# Patient Record
Sex: Male | Born: 1940 | Race: White | Hispanic: No | Smoking: Current some day smoker
Health system: Southern US, Community
[De-identification: ages and names within clinical notes are randomized; demographics above are authoritative.]

## PROBLEM LIST (undated history)

## (undated) DIAGNOSIS — I4891 Unspecified atrial fibrillation: Secondary | ICD-10-CM

## (undated) DIAGNOSIS — N529 Male erectile dysfunction, unspecified: Secondary | ICD-10-CM

## (undated) DIAGNOSIS — R7303 Prediabetes: Secondary | ICD-10-CM

## (undated) DIAGNOSIS — G4730 Sleep apnea, unspecified: Secondary | ICD-10-CM

## (undated) DIAGNOSIS — E7800 Pure hypercholesterolemia, unspecified: Secondary | ICD-10-CM

## (undated) DIAGNOSIS — K219 Gastro-esophageal reflux disease without esophagitis: Secondary | ICD-10-CM

## (undated) DIAGNOSIS — R350 Frequency of micturition: Secondary | ICD-10-CM

## (undated) DIAGNOSIS — I639 Cerebral infarction, unspecified: Secondary | ICD-10-CM

## (undated) DIAGNOSIS — J189 Pneumonia, unspecified organism: Secondary | ICD-10-CM

## (undated) DIAGNOSIS — I10 Essential (primary) hypertension: Secondary | ICD-10-CM

## (undated) DIAGNOSIS — R001 Bradycardia, unspecified: Secondary | ICD-10-CM

## (undated) DIAGNOSIS — J449 Chronic obstructive pulmonary disease, unspecified: Secondary | ICD-10-CM

## (undated) DIAGNOSIS — R0600 Dyspnea, unspecified: Secondary | ICD-10-CM

## (undated) DIAGNOSIS — C61 Malignant neoplasm of prostate: Secondary | ICD-10-CM

## (undated) DIAGNOSIS — R413 Other amnesia: Secondary | ICD-10-CM

## (undated) DIAGNOSIS — I499 Cardiac arrhythmia, unspecified: Secondary | ICD-10-CM

## (undated) DIAGNOSIS — R3915 Urgency of urination: Secondary | ICD-10-CM

## (undated) DIAGNOSIS — I219 Acute myocardial infarction, unspecified: Secondary | ICD-10-CM

## (undated) DIAGNOSIS — M1990 Unspecified osteoarthritis, unspecified site: Secondary | ICD-10-CM

## (undated) HISTORY — DX: Unspecified atrial fibrillation: I48.91

## (undated) HISTORY — DX: Malignant neoplasm of prostate: C61

## (undated) HISTORY — DX: Pure hypercholesterolemia, unspecified: E78.00

## (undated) HISTORY — DX: Essential (primary) hypertension: I10

---

## 2002-01-20 ENCOUNTER — Ambulatory Visit (HOSPITAL_COMMUNITY): Admission: RE | Admit: 2002-01-20 | Discharge: 2002-01-20 | Payer: Self-pay | Admitting: Diagnostic Radiology

## 2006-09-10 ENCOUNTER — Ambulatory Visit (HOSPITAL_COMMUNITY): Admission: RE | Admit: 2006-09-10 | Discharge: 2006-09-10 | Payer: Self-pay | Admitting: Family Medicine

## 2006-09-14 ENCOUNTER — Ambulatory Visit (HOSPITAL_COMMUNITY): Admission: RE | Admit: 2006-09-14 | Discharge: 2006-09-14 | Payer: Self-pay | Admitting: Family Medicine

## 2006-10-03 ENCOUNTER — Ambulatory Visit (HOSPITAL_COMMUNITY): Admission: RE | Admit: 2006-10-03 | Discharge: 2006-10-03 | Payer: Self-pay | Admitting: Internal Medicine

## 2006-10-03 ENCOUNTER — Encounter (INDEPENDENT_AMBULATORY_CARE_PROVIDER_SITE_OTHER): Payer: Self-pay | Admitting: Specialist

## 2006-10-03 ENCOUNTER — Ambulatory Visit: Payer: Self-pay | Admitting: Internal Medicine

## 2006-10-03 HISTORY — PX: COLONOSCOPY: SHX174

## 2007-12-24 ENCOUNTER — Ambulatory Visit (HOSPITAL_COMMUNITY): Admission: RE | Admit: 2007-12-24 | Discharge: 2007-12-24 | Payer: Self-pay | Admitting: Family Medicine

## 2011-04-21 NOTE — Op Note (Signed)
Jonathan Greene, Jonathan Greene             ACCOUNT NO.:  [id number]   MEDICAL RECORD NO.:  [id number]          PATIENT TYPE:  AMB   LOCATION:  DAY                           FACILITY:  [facility]   PHYSICIAN:  R. Roetta Sessions, M.D. DATE OF BIRTH:  [birth year]   DATE OF PROCEDURE:  10/03/2006  DATE OF DISCHARGE:                                 OPERATIVE REPORT   INDICATIONS FOR PROCEDURE:  The patient is a 65-year gentleman who was sent  over at the courtesy of Dr. Regino Schultze for colorectal cancer screening.  He is  devoid of any lower GI tract symptoms.  He has never had his lower GI tract  imaged previously and there is no family history of colorectal cancer.  Colonoscopy is now being done.  This approach has discussed with the patient  at that length.  Potential risks, benefits and alternatives have been  reviewed, questions answered and he is agreeable.  Please see documentation  in medical record.   PROCEDURE NOTE:  O2 saturation, blood pressure, pulse and respirations were  monitored throughout entire procedure.   CONSCIOUS SEDATION:  Versed 4 mg IV Demerol 75 mg IV in divided doses.   INSTRUMENT:  Olympus video chip system.   FINDINGS:  Digital rectal exam revealed no abnormalities.   ENDOSCOPIC FINDINGS:  The prep was adequate.   Rectum:  Examination of rectal mucosa including retroflexed view of the anal  verge revealed clustering of four diminutive polyps just inside the anal  verge.  Please see photos.  The largest one was approximately 3 mm in  diameter.  The remainder of  the rectal mucosa appeared normal.   Colon:  Colonic mucosa was surveyed, rectosigmoid junction through the left  transverse and right colon to the area of appendiceal orifice, ileocecal  valve and cecum.  These structures were well seen and photographed for the  record.  From this level, the scope was slowly withdrawn.  All previously  mentioned mucosal surfaces were again seen.  The patient had sigmoid  diverticula.  The remainder of the colonic mucosa appeared normal.  The  diminutive polyps in the rectum were cold biopsied/removed.  The patient  tolerated the procedure well as reactive to endoscopy.   IMPRESSION:  1. Diminutive rectal polyps cold biopsied/removed, otherwise normal      rectum.  2. Sigmoid diverticula.  Remainder of colon mucosa appeared normal.   RECOMMENDATIONS:  1. Diverticulosis literature provided to [name].  2. Follow-up on path.  3. Further recommendations to follow.      [name], M.D.  Electronically Signed     RMR/MEDQ  D:  10/03/2006  T:  10/03/2006  Job:  [id number]   cc:   Kirk Ruths, M.D.  Fax: [phone]

## 2011-12-06 ENCOUNTER — Encounter: Payer: Self-pay | Admitting: Radiation Oncology

## 2011-12-06 NOTE — Progress Notes (Signed)
72 year old male. August 2011 presented to Dr. Isabel Caprice with a mildly elevated PSA of 4.5 and ultrasound showed prostate volume of 30 grams. Biopsy revealed adenocarcinoma of the prostate with a very favorable situation and 1/12 cores showed about 5% involvement with a Gleason 3+3=6. Patient returned to Dr. Isabel Caprice 11/20/11 following ultrasound and biopsy. This biopsy showed Gleason's 7 cancer in 3 out of 6 biopsies from the left side and on the right he still had 1/6 biopsies positive for a Gleason's 6 cancer. Prostate volume remains 30 grams. Clear evidence of pathologic progression was noted. Patient here today to discuss his options seed alone versus external beam plus seeds. Dr. Isabel Caprice has put the patient on an alpha blocker to see how much this improves his voiding.

## 2011-12-07 ENCOUNTER — Ambulatory Visit
Admission: RE | Admit: 2011-12-07 | Discharge: 2011-12-07 | Disposition: A | Discharge status: Home / Self Care | Payer: Medicare Other | Source: Ambulatory Visit | Attending: Radiation Oncology | Admitting: Radiation Oncology

## 2011-12-07 ENCOUNTER — Encounter: Payer: Self-pay | Admitting: Radiation Oncology

## 2011-12-07 VITALS — BP 148/81 | HR 50 | Temp 97.2°F | Resp 18 | Ht 75.0 in | Wt 215.2 lb

## 2011-12-07 DIAGNOSIS — Z510 Encounter for antineoplastic radiation therapy: Secondary | ICD-10-CM | POA: Insufficient documentation

## 2011-12-07 DIAGNOSIS — C61 Malignant neoplasm of prostate: Secondary | ICD-10-CM | POA: Insufficient documentation

## 2011-12-07 DIAGNOSIS — Z79899 Other long term (current) drug therapy: Secondary | ICD-10-CM | POA: Insufficient documentation

## 2011-12-07 HISTORY — DX: Bradycardia, unspecified: R00.1

## 2011-12-07 NOTE — Progress Notes (Signed)
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 male who is seen today for the courtesy of Dr. 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 He presented with an elevated PSA of 4.53 and the summer of 2011. Ultrasound-guided biopsies revealed adenocarcinoma with a Gleason score of 6 (3+3) involving 5% of one core from the right apex. He elected for observation. His PSA rose to 7.18 and repeat biopsies revealed Gleason 7 (4+3) involving 20% of one core from the left lateral mid gland and 30% of one core from the left lateral apex. He a Gleason 7 (3+4) involving 40% of one core from the left lateral base and also Gleason 6 (3+3) involving 5% of one core from right lateral mid gland. His gland volume was approximately 30 cc. He does have a history of urinary urgency and perhaps some obstructive symptomatology, but his IP PS score today is 10. He does have a moderate amount of urinary urgency. He claims to have erections but is not sexually active. He is considering a robotic prostatectomy with Dr. Isabel Caprice.   PREVIOUS RADIATION THERAPY: No   PAST MEDICAL HISTORY:  has a past medical history of Heartburn; Hypercholesterolemia; Hypertension; Prostate cancer; and Bradycardia.     PAST SURGICAL HISTORY:  Past Surgical History  Procedure Date  . Prostate biopsy   . Broken finger     pointer finger left hand with pins     FAMILY HISTORY: family history includes Stroke in his mother.  There is no history of Cancer. he tells me that most of his paternal uncles lived to be well  into their 47s and one lived to be over 100.   SOCIAL HISTORY: Married, 1 daughter and one adopted son.  reports that he has quit smoking. His smoking use included Cigarettes. He has a 70 pack-year smoking history. His smokeless tobacco use includes Chew. He reports that he drinks about 1.2 ounces of alcohol per week. He reports that he does not use illicit drugs. Retired Publishing rights manager   ALLERGIES: Review of patient's allergies indicates no known allergies.   MEDICATIONS:  Current Outpatient Prescriptions  Medication Sig Dispense Refill  . aspirin 81 MG tablet Take 160 mg by mouth daily.        Marland Kitchen b complex vitamins capsule Take 1 capsule by mouth daily.        Marland Kitchen co-enzyme Q-10 30 MG capsule Take 30 mg by mouth 3 (three) times daily.        . fish oil-omega-3 fatty acids 1000 MG capsule Take 2 g by mouth daily.        Marland Kitchen lisinopril (PRINIVIL,ZESTRIL) 20 MG tablet Take 20 mg by mouth daily.        Marland Kitchen omeprazole (PRILOSEC) 20 MG capsule Take 20 mg by mouth daily.        . rosuvastatin (CRESTOR) 10 MG tablet Take 10 mg by mouth daily.            REVIEW OF SYSTEMS:  Pertinent items are noted in HPI.    PHYSICAL EXAM:  height is  6\' 3"  (1.905 m) and weight is 215 lb 3.2 oz (97.614 kg). His oral temperature is 97.2 F (36.2 C). His blood pressure is 148/81 and his pulse is 50. His respiration is 18 and oxygen saturation is 100%.   Head and neck examination grossly unremarkable. Nodes: Without palpable cervical or supraclavicular lymphadenopathy. Chest: Lungs clear. Back: Without spinal or CVA tenderness. Heart: Regular rate rhythm. Abdomen: Without masses or organomegaly. Genitalia: Unremarkable to inspection. Rectal: The gland is slightly indurated throughout, particularly in the left apical region which is slightly raised. Extremities without edema. Neurologic examination grossly nonfocal.   LABORATORY DATA:  No results found for this basename: WBC, HGB, HCT, MCV, PLT   No results found for  this basename: NA, K, CL, CO2   No results found for this basename: ALT, AST, GGT, ALKPHOS, BILITOT   PSA 7.18 from 10/30/2011.   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 I explained to Mr. Casale that his prognosis is related to his stage, Gleason score, and PSA level. His stage and PSA level are favorable, however his Gleason score is of intermediate favorability. We discussed surgery versus continued observation versus radiation therapy. Considering his disease progression over the past year, do recommend surgery or radiation therapy with curative intent. His radiation therapy options include 8 weeks of external beam/IMR T. or 5 weeks of external beam followed by seed implantation. Based on his I PSS score of 10 he is a candidate for seed implantation as a boost. Dr. Isabel Caprice called in a prescription for an alpha blocker to see if this helps him with his urinary symptomatology. He lives just [place] and he would come to [place] for his external beam treatment. I discussed the potential acute and late toxicities of radiation therapy. He will think things over and get back in touch with Dr. Isabel Caprice regarding surgery or me regarding possible radiation therapy.   PLAN: As described above. If he wants to proceed with radiation therapy and I will need to have Dr. Isabel Caprice place 3 gold seed markers into his prostate for image guidance.   I spent 60 minutes minutes face to face with the patient and more than 50% of that time was spent in counseling and/or coordination of care.

## 2011-12-07 NOTE — Progress Notes (Signed)
Completed PATIENT MEASURE OF DISTRESS thermometer with score of 8 turned into social work. Please see prior progress note from this writer addressing distress. Also, completed NUTRITION RISK SCREEN worksheet completed and turned into Zenovia Jarred, RD.

## 2011-12-07 NOTE — Progress Notes (Addendum)
Patient presents to the clinic today unaccompanied for an initial consultation with Dr. Dayton Scrape reference prostate ca. Patient is alert and oriented to person, place, and time. No distress noted. Steady gait noted. Pleasant affect noted. Patient denies pain at this time. Patient reports he is under a lot of stress presently while caring for his wife. Patient reports his wife was recently discharged from [facility] hospital following care for a bad heart valve. Complete DISTRESS THERMOMETER worksheet turned into social work with a score of 8. Patient denies need to see social worker today. Patient confirms his distress will improve as his wife does. Patient reports he is not incontinent of urine but that once he get the urge to go he has to find a bathroom immediately. Patient reports an overall weak urine stream in comparison to "what it was years ago." Patient reports that "hardly ever" does he have to get up during the middle of the night to void. Patient reports only experiencing burning upon urination following biopsy. Patient denies seeing blood in his urine.   IPSS score 10. NKDA Patient denies having radiation in the past. Patient denies having a pacemaker.   Lives independently with wife. 1 adopted child and 1 blood child. 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

## 2011-12-07 NOTE — Progress Notes (Signed)
Encounter addended by: Ardell Isaacs on: 12/07/2011  5:20 PM<BR>     Documentation filed: Charges VN

## 2011-12-07 NOTE — Progress Notes (Signed)
Please see the Nurse Progress Note in the MD Initial Consult Encounter for this patient. 

## 2011-12-11 ENCOUNTER — Encounter: Payer: Self-pay | Admitting: Radiation Oncology

## 2011-12-11 ENCOUNTER — Telehealth: Payer: Self-pay | Admitting: *Deleted

## 2011-12-11 NOTE — Progress Notes (Signed)
Chart note:  [name]: Today and he wants to proceed with 5 weeks of external beam radiation therapy followed by seed implantation. He does have a history of obstructive symptomatology, but his IPS S. score is satisfactory. We will, we asked Dr. Isabel Caprice to place 3 gold markers within the prostate and then have him return for simulation/treatment planning. Consent will be signed when he return for treatment planning.

## 2011-12-29 ENCOUNTER — Telehealth: Payer: Self-pay | Admitting: *Deleted

## 2012-01-01 ENCOUNTER — Ambulatory Visit
Admission: RE | Admit: 2012-01-01 | Discharge: 2012-01-01 | Disposition: A | Discharge status: Home / Self Care | Payer: Medicare Other | Source: Ambulatory Visit | Attending: Radiation Oncology | Admitting: Radiation Oncology

## 2012-01-01 DIAGNOSIS — C61 Malignant neoplasm of prostate: Secondary | ICD-10-CM

## 2012-01-01 NOTE — Progress Notes (Signed)
Simulation/treatment planning note:  [name] underwent simulation/treatment planning today in the management of his carcinoma prostate. An alpha cradle was constructed for immobilization. A red rubber catheter was placed within the rectal vault. He was then catheterized and contrast instilled into the bladder/urethra. He was then scanned. I chose and isocenter in the center of the prostate. His CT data was sent for treatment planning and a CT arch study. His prostate was contoured in Focal[employer] and he was then had a prostate volume of 29.7 cc and prosthetic length of 4.2 cm. His archeis narrow, however he is a candidate for a prostate seed implant boost. I also contoured his seminal vesicles and prostate in Eclipse. His bladder, rectum and femoral heads were also contoured. I'm prescribing 4500 cGy to his prostate PTV which includes the prostate +1.0 cm except for 0.5 cm along the rectum. This will also include the seminal vesicles by 0.5 cm. He is now ready for 3-D simulation. This was followed by prostate seed implant to deliver a further 11,000 cGy.I-125. He'll be implanted with the Nucletron system.

## 2012-01-01 NOTE — Progress Notes (Signed)
Met with patient to discuss RO billing.  Patient had no concerns today. 

## 2012-01-02 ENCOUNTER — Encounter: Payer: Self-pay | Admitting: Radiation Oncology

## 2012-01-02 NOTE — Progress Notes (Signed)
3-D simulation note:  The patient underwent 3-D simulation in the management of his carcinoma the prostate. He was set up with 4 field technique. 4 separate multileaf collimators were designed to conform the field. Dose volume histograms were obtained for the prostate, rectum, and bladder. I chose the 97% isodose curve to cover the planning target volume which represents a prostate +1.0 cm except for 0.5 cm along the rectum. This also included the seminal vesicles posterior 0.5 cm.

## 2012-01-08 ENCOUNTER — Ambulatory Visit
Admission: RE | Admit: 2012-01-08 | Discharge: 2012-01-08 | Disposition: A | Discharge status: Home / Self Care | Payer: Medicare Other | Source: Ambulatory Visit | Attending: Radiation Oncology | Admitting: Radiation Oncology

## 2012-01-08 ENCOUNTER — Encounter: Payer: Self-pay | Admitting: Radiation Oncology

## 2012-01-08 NOTE — Progress Notes (Signed)
Simulation verification note: The patient underwent simulation verification today in the management of his carcinoma the prostate. His isocenter is in good position and the multileaf collimators contoured the treatment volume appropriately.

## 2012-01-09 ENCOUNTER — Ambulatory Visit
Admission: RE | Admit: 2012-01-09 | Discharge: 2012-01-09 | Disposition: A | Discharge status: Home / Self Care | Payer: Medicare Other | Source: Ambulatory Visit | Attending: Radiation Oncology | Admitting: Radiation Oncology

## 2012-01-10 ENCOUNTER — Ambulatory Visit
Admission: RE | Admit: 2012-01-10 | Discharge: 2012-01-10 | Disposition: A | Discharge status: Home / Self Care | Payer: Medicare Other | Source: Ambulatory Visit | Attending: Radiation Oncology | Admitting: Radiation Oncology

## 2012-01-10 DIAGNOSIS — C61 Malignant neoplasm of prostate: Secondary | ICD-10-CM

## 2012-01-10 NOTE — Progress Notes (Signed)
Patient presents to the clinic today unaccompanied for post sim education with Lelon Mast, Charity fundraiser. Patient is alert and oriented to person, place, and time. No distress noted. Steady gait noted. Pleasant affect noted. Patient denies pain at this time. Oriented patient to staff and routine of the clinic. Provided patient with RADIATION THERAPY AND YOU handbook and reviewed the pertinent information. Provided patient with this writers business card and encouraged him to call with needs. Patient verbalized understanding of all things reviewed.

## 2012-01-11 ENCOUNTER — Ambulatory Visit
Admission: RE | Admit: 2012-01-11 | Discharge: 2012-01-11 | Disposition: A | Discharge status: Home / Self Care | Payer: Medicare Other | Source: Ambulatory Visit | Attending: Radiation Oncology | Admitting: Radiation Oncology

## 2012-01-12 ENCOUNTER — Ambulatory Visit
Admission: RE | Admit: 2012-01-12 | Discharge: 2012-01-12 | Disposition: A | Discharge status: Home / Self Care | Payer: Medicare Other | Source: Ambulatory Visit | Attending: Radiation Oncology | Admitting: Radiation Oncology

## 2012-01-15 ENCOUNTER — Other Ambulatory Visit: Payer: Self-pay | Admitting: Urology

## 2012-01-15 ENCOUNTER — Ambulatory Visit
Admission: RE | Admit: 2012-01-15 | Discharge: 2012-01-15 | Disposition: A | Discharge status: Home / Self Care | Payer: Medicare Other | Source: Ambulatory Visit | Attending: Radiation Oncology | Admitting: Radiation Oncology

## 2012-01-15 ENCOUNTER — Encounter: Payer: Self-pay | Admitting: Radiation Oncology

## 2012-01-15 VITALS — BP 140/87 | HR 49 | Resp 18 | Wt 220.2 lb

## 2012-01-15 DIAGNOSIS — C61 Malignant neoplasm of prostate: Secondary | ICD-10-CM

## 2012-01-15 NOTE — Progress Notes (Signed)
Patient presents to the clinic today unaccompanied for under treat visit with Dr. Dayton Scrape. Patient is alert and oriented to person, place, and time. No distress noted. Steady gait noted. Pleasant affect noted. Patient denies pain at this time. Patient denies pain or burning with bowel movement or urination. Patient reports that he got up once to void all last week during the night. Patient reports that his urine stream is stronger while taking Flomax. Patient reports concerns about taking Flomax because it causes his bp to drop. Reported all findings to Dr. Dayton Scrape.

## 2012-01-15 NOTE — Progress Notes (Signed)
Weekly Management Note:  Site:Prostate Current Dose:  900  cGy Projected Dose: 4500  cGy  Narrative: The patient is seen today for routine under treatment assessment. CBCT/MVCT images/port films were reviewed. The chart was reviewed.   Satisfactory bladder filling today. No GU or GI difficulties. He does have some orthostatic hypotension when getting out of bed in the morning, and this began after starting Flomax. His Flomax has been helpful with respect to improving his urinary stream.  Physical Examination:  Filed Vitals:   01/15/12 0957  BP: 140/87  Pulse: 49  Resp: 18  .  Weight: 220 lb 3.2 oz (99.882 kg). No change. There is no orthostatic hypotension today.  Impression: Tolerating radiation therapy well. 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 I told him to sit on his bed for at least one to 2 minutes before standing and walking around. If his lightheadedness continues we may need to change the timing of his Flomax.  Plan: Continue radiation therapy as planned. Implant boost date is pending.

## 2012-01-16 ENCOUNTER — Ambulatory Visit
Admission: RE | Admit: 2012-01-16 | Discharge: 2012-01-16 | Disposition: A | Discharge status: Home / Self Care | Payer: Medicare Other | Source: Ambulatory Visit | Attending: Radiation Oncology | Admitting: Radiation Oncology

## 2012-01-17 ENCOUNTER — Ambulatory Visit
Admission: RE | Admit: 2012-01-17 | Discharge: 2012-01-17 | Disposition: A | Discharge status: Home / Self Care | Payer: Medicare Other | Source: Ambulatory Visit | Attending: Radiation Oncology | Admitting: Radiation Oncology

## 2012-01-18 ENCOUNTER — Ambulatory Visit
Admission: RE | Admit: 2012-01-18 | Discharge: 2012-01-18 | Disposition: A | Discharge status: Home / Self Care | Payer: Medicare Other | Source: Ambulatory Visit | Attending: Radiation Oncology | Admitting: Radiation Oncology

## 2012-01-18 ENCOUNTER — Encounter: Payer: Self-pay | Admitting: Radiation Oncology

## 2012-01-18 NOTE — Progress Notes (Signed)
Weekly Management Note:  Site:Prostate Current Dose:  1440  cGy Projected Dose: 4500  cGy  Narrative: The patient is seen today for routine under treatment assessment. CBCT/MVCT images/port films were reviewed. The chart was reviewed.   No complaints today.  Physical Examination: There were no vitals filed for this visit..  Weight:  . No change.  Impression: Tolerating radiation therapy well.  Plan: Continue radiation therapy as planned.

## 2012-01-19 ENCOUNTER — Ambulatory Visit
Admission: RE | Admit: 2012-01-19 | Discharge: 2012-01-19 | Disposition: A | Discharge status: Home / Self Care | Payer: Medicare Other | Source: Ambulatory Visit | Attending: Radiation Oncology | Admitting: Radiation Oncology

## 2012-01-22 ENCOUNTER — Ambulatory Visit
Admission: RE | Admit: 2012-01-22 | Discharge: 2012-01-22 | Disposition: A | Discharge status: Home / Self Care | Payer: Medicare Other | Source: Ambulatory Visit | Attending: Radiation Oncology | Admitting: Radiation Oncology

## 2012-01-23 ENCOUNTER — Ambulatory Visit
Admission: RE | Admit: 2012-01-23 | Discharge: 2012-01-23 | Disposition: A | Discharge status: Home / Self Care | Payer: Medicare Other | Source: Ambulatory Visit | Attending: Radiation Oncology | Admitting: Radiation Oncology

## 2012-01-24 ENCOUNTER — Ambulatory Visit
Admission: RE | Admit: 2012-01-24 | Discharge: 2012-01-24 | Disposition: A | Discharge status: Home / Self Care | Payer: Medicare Other | Source: Ambulatory Visit | Attending: Radiation Oncology | Admitting: Radiation Oncology

## 2012-01-25 ENCOUNTER — Ambulatory Visit
Admission: RE | Admit: 2012-01-25 | Discharge: 2012-01-25 | Disposition: A | Discharge status: Home / Self Care | Payer: Medicare Other | Source: Ambulatory Visit | Attending: Radiation Oncology | Admitting: Radiation Oncology

## 2012-01-26 ENCOUNTER — Ambulatory Visit
Admission: RE | Admit: 2012-01-26 | Discharge: 2012-01-26 | Disposition: A | Discharge status: Home / Self Care | Payer: Medicare Other | Source: Ambulatory Visit | Attending: Radiation Oncology | Admitting: Radiation Oncology

## 2012-01-26 NOTE — Telephone Encounter (Signed)
xxx

## 2012-01-29 ENCOUNTER — Other Ambulatory Visit: Payer: Self-pay | Admitting: Urology

## 2012-01-29 ENCOUNTER — Ambulatory Visit
Admission: RE | Admit: 2012-01-29 | Discharge: 2012-01-29 | Disposition: A | Discharge status: Home / Self Care | Payer: Medicare Other | Source: Ambulatory Visit | Attending: Radiation Oncology | Admitting: Radiation Oncology

## 2012-01-29 ENCOUNTER — Encounter: Payer: Self-pay | Admitting: Radiation Oncology

## 2012-01-29 VITALS — Wt 221.4 lb

## 2012-01-29 DIAGNOSIS — C61 Malignant neoplasm of prostate: Secondary | ICD-10-CM

## 2012-01-29 NOTE — Progress Notes (Signed)
Weekly Management Note:  Site:Prostate Current Dose:  2700  cGy Projected Dose: 4500  cGy  Narrative: The patient is seen today for routine under treatment assessment. CBCT/MVCT images/port films were reviewed. The chart was reviewed.   Good bladder filling. No GU or GI problems. He is less lightheaded when he takes his Flomax after lunch each day.  Physical Examination: There were no vitals filed for this visit..  Weight: 221 lb 6.4 oz (100.426 kg). No change.  Impression: Tolerating radiation therapy well. He'll have his seed implant boost with Dr. Brunilda Payor on March 27.  Plan: Continue radiation therapy as planned.

## 2012-01-29 NOTE — Progress Notes (Signed)
Slight fatigue, no bladder or bowel issues.

## 2012-01-30 ENCOUNTER — Other Ambulatory Visit: Payer: Self-pay

## 2012-01-30 ENCOUNTER — Ambulatory Visit
Admission: RE | Admit: 2012-01-30 | Discharge: 2012-01-30 | Disposition: A | Discharge status: Home / Self Care | Payer: Medicare Other | Source: Ambulatory Visit | Attending: Urology | Admitting: Urology

## 2012-01-30 ENCOUNTER — Ambulatory Visit (HOSPITAL_BASED_OUTPATIENT_CLINIC_OR_DEPARTMENT_OTHER)
Admission: RE | Admit: 2012-01-30 | Discharge: 2012-01-30 | Disposition: A | Discharge status: Home / Self Care | Payer: Medicare Other | Source: Ambulatory Visit | Attending: Urology | Admitting: Urology

## 2012-01-30 ENCOUNTER — Ambulatory Visit
Admission: RE | Admit: 2012-01-30 | Discharge: 2012-01-30 | Disposition: A | Discharge status: Home / Self Care | Payer: Medicare Other | Source: Ambulatory Visit | Attending: Radiation Oncology | Admitting: Radiation Oncology

## 2012-01-30 DIAGNOSIS — C61 Malignant neoplasm of prostate: Secondary | ICD-10-CM | POA: Insufficient documentation

## 2012-01-30 DIAGNOSIS — I10 Essential (primary) hypertension: Secondary | ICD-10-CM | POA: Insufficient documentation

## 2012-01-30 DIAGNOSIS — E7800 Pure hypercholesterolemia, unspecified: Secondary | ICD-10-CM | POA: Insufficient documentation

## 2012-01-30 DIAGNOSIS — R9431 Abnormal electrocardiogram [ECG] [EKG]: Secondary | ICD-10-CM | POA: Insufficient documentation

## 2012-01-30 DIAGNOSIS — Z01810 Encounter for preprocedural cardiovascular examination: Secondary | ICD-10-CM | POA: Insufficient documentation

## 2012-01-31 ENCOUNTER — Ambulatory Visit
Admission: RE | Admit: 2012-01-31 | Discharge: 2012-01-31 | Disposition: A | Discharge status: Home / Self Care | Payer: Medicare Other | Source: Ambulatory Visit | Attending: Radiation Oncology | Admitting: Radiation Oncology

## 2012-02-01 ENCOUNTER — Ambulatory Visit
Admission: RE | Admit: 2012-02-01 | Discharge: 2012-02-01 | Disposition: A | Discharge status: Home / Self Care | Payer: Medicare Other | Source: Ambulatory Visit | Attending: Radiation Oncology | Admitting: Radiation Oncology

## 2012-02-02 ENCOUNTER — Ambulatory Visit
Admission: RE | Admit: 2012-02-02 | Discharge: 2012-02-02 | Disposition: A | Discharge status: Home / Self Care | Payer: Medicare Other | Source: Ambulatory Visit | Attending: Radiation Oncology | Admitting: Radiation Oncology

## 2012-02-05 ENCOUNTER — Ambulatory Visit
Admission: RE | Admit: 2012-02-05 | Discharge: 2012-02-05 | Disposition: A | Discharge status: Home / Self Care | Payer: Medicare Other | Source: Ambulatory Visit | Attending: Radiation Oncology | Admitting: Radiation Oncology

## 2012-02-05 ENCOUNTER — Encounter: Payer: Self-pay | Admitting: Radiation Oncology

## 2012-02-05 VITALS — BP 145/76 | HR 49 | Resp 18 | Wt 224.2 lb

## 2012-02-05 DIAGNOSIS — C61 Malignant neoplasm of prostate: Secondary | ICD-10-CM

## 2012-02-05 NOTE — Progress Notes (Signed)
Patient presents to the clinic today unaccompanied for under treat visit with Dr. Dayton Scrape. Patient is alert and oriented to person, place, and time. No distress noted. Steady gait noted. Pleasant affect noted. Patient denies pain at this time. Patient reports occasional burning with urination. Patient denies hematuria and nocturia. Patient reports that he "has been taking it really easy" therefore he is unsure if his energy level has declined. Patient is concerned about his wife's upcoming open heart surgery. Reported all findings to Dr. Dayton Scrape.

## 2012-02-05 NOTE — Progress Notes (Signed)
Weekly Management Note:  Site:Prostate Current Dose:  3600  cGy Projected Dose: 4500  cGy  Narrative: The patient is seen today for routine under treatment assessment. CBCT/MVCT images/port films were reviewed. The chart was reviewed.   The bladder filling today. He does report slight dysuria which is not particularly bothersome. No GI difficulties. I told that he may take ibuprofen up until 10 days before his implant if needed for painful urination.  Physical Examination:  Filed Vitals:   02/05/12 0949  BP: 145/76  Pulse: 49  Resp: 18  .  Weight: 224 lb 3.2 oz (101.696 kg). No change.  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  Plan: Continue radiation therapy as planned. She is scheduled for his seed implant boost on March 27.

## 2012-02-06 ENCOUNTER — Ambulatory Visit
Admission: RE | Admit: 2012-02-06 | Discharge: 2012-02-06 | Disposition: A | Discharge status: Home / Self Care | Payer: Medicare Other | Source: Ambulatory Visit | Attending: Radiation Oncology | Admitting: Radiation Oncology

## 2012-02-07 ENCOUNTER — Ambulatory Visit
Admission: RE | Admit: 2012-02-07 | Discharge: 2012-02-07 | Disposition: A | Discharge status: Home / Self Care | Payer: Medicare Other | Source: Ambulatory Visit | Attending: Radiation Oncology | Admitting: Radiation Oncology

## 2012-02-08 ENCOUNTER — Ambulatory Visit
Admission: RE | Admit: 2012-02-08 | Discharge: 2012-02-08 | Disposition: A | Discharge status: Home / Self Care | Payer: Medicare Other | Source: Ambulatory Visit | Attending: Radiation Oncology | Admitting: Radiation Oncology

## 2012-02-09 ENCOUNTER — Ambulatory Visit
Admission: RE | Admit: 2012-02-09 | Discharge: 2012-02-09 | Disposition: A | Discharge status: Home / Self Care | Payer: Medicare Other | Source: Ambulatory Visit | Attending: Radiation Oncology | Admitting: Radiation Oncology

## 2012-02-12 ENCOUNTER — Ambulatory Visit
Admission: RE | Admit: 2012-02-12 | Discharge: 2012-02-12 | Disposition: A | Discharge status: Home / Self Care | Payer: Medicare Other | Source: Ambulatory Visit | Attending: Radiation Oncology | Admitting: Radiation Oncology

## 2012-02-12 ENCOUNTER — Encounter: Payer: Self-pay | Admitting: Radiation Oncology

## 2012-02-12 VITALS — BP 152/87 | HR 47 | Resp 18 | Wt 224.7 lb

## 2012-02-12 DIAGNOSIS — C61 Malignant neoplasm of prostate: Secondary | ICD-10-CM

## 2012-02-12 NOTE — Progress Notes (Signed)
Patient presents to the clinic today unaccompanied for under treat visit with Dr. Dayton Scrape. Patient is alert and oriented to person, place, and time. No distress noted. Steady gait noted. Pleasant affect noted. Patient denies pain at this time. Patient reports that burning with urination has improved. Patient denies nocturia, frequency or urgency. Patient reports his energy level good. Reported all findings to Dr. Dayton Scrape.

## 2012-02-12 NOTE — Progress Notes (Signed)
Weekly Management Note:  Site:Prostate Current Dose:  4500  cGy Projected Dose: 4500  cGy  Narrative: The patient is seen today for routine under treatment assessment. CBCT/MVCT images/port films were reviewed. The chart was reviewed.   His bladder filling is excellent. No GU or GI difficulties. His dysuria is actually almost nonexistent. External beam portion of his treatment is completed today.  Physical Examination:  Filed Vitals:   02/12/12 0928  BP: 152/87  Pulse: 47  Resp: 18  .  Weight: 224 lb 11.2 oz (101.923 kg). No change.  Impression: Satisfactory progress. His external beam treatment is completed today.  Plan: A seed implant boost on March 27 with Dr. Brunilda Payor.

## 2012-02-15 ENCOUNTER — Encounter: Payer: Self-pay | Admitting: Radiation Oncology

## 2012-02-15 NOTE — Progress Notes (Signed)
[facility] Radiation Oncology  Name:Jonathan Greene  Date:02/15/2012           ZOX:[id number] DOB:[birth year]   Status:outpatient    CC: Valetta Fuller, MD, MD    REFERRING PHYSICIAN: Dr. Barron Alvine    DIAGNOSIS: Stage T2a intermediate risk adenocarcinoma prostate   INDICATION FOR TREATMENT: Curative   TREATMENT DATES: 01/09/2012 through 02/12/2012                          SITE/DOSE:   Prostate/seminal vesicles 4500 cGy 25 sessions                         BEAMS/ENERGY: 15 MEV electrons, 4 field technique with 3-D conformal planning                  NARRATIVE:    The patient tolerated treatment well with no significant GU or GI toxicities.                        PLAN: He will return for his prostate seed implant boost with Dr. Isabel Caprice on March 27.   Patient instructed to call if questions or worsening complaints in interim.

## 2012-02-21 ENCOUNTER — Encounter (HOSPITAL_BASED_OUTPATIENT_CLINIC_OR_DEPARTMENT_OTHER): Payer: Self-pay | Admitting: *Deleted

## 2012-02-21 LAB — COMPREHENSIVE METABOLIC PANEL
Alkaline Phosphatase: 91 U/L (ref 39–117)
BUN: 10 mg/dL (ref 6–23)
GFR calc Af Amer: >90 mL/min (ref >90)
Glucose, Bld: 115 mg/dL — ABNORMAL HIGH (ref 70–99)
Potassium: 3.6 mEq/L (ref 3.5–5.1)
Total Protein: 6.7 g/dL (ref 6.0–8.3)

## 2012-02-21 LAB — CBC
HCT: 43.7 % (ref 39.0–52.0)
Hemoglobin: 14.6 g/dL (ref 13.0–17.0)
MCH: 30.6 pg (ref 26.0–34.0)
MCHC: 33.4 g/dL (ref 30.0–36.0)
MCV: 91.6 fL (ref 78.0–100.0)

## 2012-02-21 LAB — PROTIME-INR: Prothrombin Time: 13.5 seconds (ref 11.6–15.2)

## 2012-02-21 NOTE — Progress Notes (Signed)
NPO AFTER MN. ARRIVES AT 0715. CURRENT EKG AND CXR IN EPIC AND CHART. LABS TO BE DONE AT [facility]. WILL DO FLEET ENEMA AM OF SURG. W/ SIP OF WATER.

## 2012-02-27 ENCOUNTER — Telehealth: Payer: Self-pay | Admitting: *Deleted

## 2012-02-27 NOTE — Telephone Encounter (Signed)
XXXX 

## 2012-02-27 NOTE — H&P (Signed)
  Jonathan Greene is an 71 y.o. male.   Chief Complaint: Prostate cancer for seed implantation today.  HPI:  [name] returns today for a prostate cancer discussion. Status post ultrasound and biopsy of his prostate. Biopsy  showed Gleason's 7 cancer in 3 out of 6 biopsies from the left side ( primary Gleason 4 in 2/3 biopsies). On the right he still had 1/6 biopsies positive for a Gleason's 6 cancer. Volume was again around 30 grams. No clinical change. There was clear evidence of pathologic progression and we felt we needed to talk about his situation. AUA score today 18/ 2. SHIM 2/25.  The patient has completed 5 weeks of external beam radiation without Difficulty/complications.  Past Medical History  Diagnosis Date  . Hypercholesterolemia   . Hypertension   . Bradycardia   . Frequent urination   . Urinary urgency   . Prostate cancer     RADIATION TX AND SCHEDULED FOR SEED IMPLANTS 02-28-12  . GERD (gastroesophageal reflux disease)     History reviewed. No pertinent past surgical history.  Family History  Problem Relation Age of Onset  . Stroke Mother   . Cancer Neg Hx    Social History:  reports that he quit smoking about 21 years ago. His smoking use included Cigarettes. He has a 70 pack-year smoking history. His smokeless tobacco use includes Chew. He reports that he drinks about 8.4 ounces of alcohol per week. He reports that he does not use illicit drugs.  Allergies: No Known Allergies  No current facility-administered medications on file as of .   Medications Prior to Admission  Medication Sig Dispense Refill  . aspirin 81 MG tablet Take 81 mg by mouth daily.       Marland Kitchen b complex vitamins capsule Take 1 capsule by mouth daily.       Marland Kitchen co-enzyme Q-10 30 MG capsule Take 30 mg by mouth 3 (three) times daily.       . fish oil-omega-3 fatty acids 1000 MG capsule Take 1 g by mouth daily.       Marland Kitchen lisinopril (PRINIVIL,ZESTRIL) 20 MG tablet Take 20 mg by mouth daily.       Marland Kitchen  omeprazole (PRILOSEC) 20 MG capsule Take 20 mg by mouth every evening.       . rosuvastatin (CRESTOR) 10 MG tablet Take 10 mg by mouth every evening.         No results found for this or any previous visit (from the past 48 hour(s)). No results found.  Review of Systems - Negative except Indigestion, blurred vision, urinary urgency  Height 6\' 3"  (1.905 m), weight 97.523 kg (215 lb). General appearance: alert, cooperative and no distress Neck: no adenopathy and no JVD Resp: clear to auscultation bilaterally Cardio: regular rate and rhythm GI: soft, non-tender; bowel sounds normal; no masses,  no organomegaly Male genitalia: normal, penis: no lesions or discharge. testes: no masses or tenderness. no hernias. Prostate 1+ Extremities: extremities normal, atraumatic, no cyanosis or edema Skin: Skin color, texture, turgor normal. No rashes or lesions Neurologic: Grossly normal  Assessment/Plan Prostate cancer. Seed implantation boost as an outpatient today.  Thong Feeny S 02/27/2012, 12:54 PM

## 2012-02-28 ENCOUNTER — Encounter (HOSPITAL_BASED_OUTPATIENT_CLINIC_OR_DEPARTMENT_OTHER)
Admission: RE | Disposition: A | Discharge status: Home / Self Care | Payer: Self-pay | Source: Ambulatory Visit | Attending: Urology

## 2012-02-28 ENCOUNTER — Ambulatory Visit (HOSPITAL_BASED_OUTPATIENT_CLINIC_OR_DEPARTMENT_OTHER)
Admission: RE | Admit: 2012-02-28 | Discharge: 2012-02-28 | Disposition: A | Discharge status: Home / Self Care | Payer: Medicare Other | Source: Ambulatory Visit | Attending: Urology | Admitting: Urology

## 2012-02-28 ENCOUNTER — Ambulatory Visit (HOSPITAL_COMMUNITY): Payer: Medicare Other

## 2012-02-28 ENCOUNTER — Encounter (HOSPITAL_BASED_OUTPATIENT_CLINIC_OR_DEPARTMENT_OTHER): Payer: Self-pay | Admitting: *Deleted

## 2012-02-28 ENCOUNTER — Ambulatory Visit (HOSPITAL_BASED_OUTPATIENT_CLINIC_OR_DEPARTMENT_OTHER): Payer: Medicare Other | Admitting: Anesthesiology

## 2012-02-28 ENCOUNTER — Encounter (HOSPITAL_BASED_OUTPATIENT_CLINIC_OR_DEPARTMENT_OTHER): Payer: Self-pay | Admitting: Anesthesiology

## 2012-02-28 DIAGNOSIS — C61 Malignant neoplasm of prostate: Secondary | ICD-10-CM | POA: Insufficient documentation

## 2012-02-28 DIAGNOSIS — Z79899 Other long term (current) drug therapy: Secondary | ICD-10-CM | POA: Insufficient documentation

## 2012-02-28 DIAGNOSIS — K219 Gastro-esophageal reflux disease without esophagitis: Secondary | ICD-10-CM | POA: Insufficient documentation

## 2012-02-28 DIAGNOSIS — Z7982 Long term (current) use of aspirin: Secondary | ICD-10-CM | POA: Insufficient documentation

## 2012-02-28 DIAGNOSIS — F17200 Nicotine dependence, unspecified, uncomplicated: Secondary | ICD-10-CM | POA: Insufficient documentation

## 2012-02-28 DIAGNOSIS — E7800 Pure hypercholesterolemia, unspecified: Secondary | ICD-10-CM | POA: Insufficient documentation

## 2012-02-28 DIAGNOSIS — I10 Essential (primary) hypertension: Secondary | ICD-10-CM | POA: Insufficient documentation

## 2012-02-28 HISTORY — PX: RADIOACTIVE SEED IMPLANT: SHX5150

## 2012-02-28 HISTORY — DX: Frequency of micturition: R35.0

## 2012-02-28 HISTORY — DX: Urgency of urination: R39.15

## 2012-02-28 HISTORY — DX: Gastro-esophageal reflux disease without esophagitis: K21.9

## 2012-02-28 HISTORY — PX: CYSTOSCOPY: SHX5120

## 2012-02-28 SURGERY — INSERTION, RADIATION SOURCE, PROSTATE
Anesthesia: General | Site: Rectum | Wound class: Clean Contaminated

## 2012-02-28 MED ORDER — EPHEDRINE SULFATE 50 MG/ML IJ SOLN
INTRAMUSCULAR | Status: DC | PRN
  Administered 2012-02-28 (×4): 5 mg via INTRAVENOUS
  Administered 2012-02-28: 10 mg via INTRAVENOUS
  Administered 2012-02-28: 5 mg via INTRAVENOUS

## 2012-02-28 MED ORDER — CIPROFLOXACIN IN D5W 400 MG/200ML IV SOLN
400.0000 mg | INTRAVENOUS | Status: AC
  Administered 2012-02-28: 400 mg via INTRAVENOUS

## 2012-02-28 MED ORDER — STERILE WATER FOR IRRIGATION IR SOLN
Status: DC | PRN
  Administered 2012-02-28: 3000 mL

## 2012-02-28 MED ORDER — ROCURONIUM BROMIDE 100 MG/10ML IV SOLN
INTRAVENOUS | Status: DC | PRN
  Administered 2012-02-28: 10 mg via INTRAVENOUS
  Administered 2012-02-28: 20 mg via INTRAVENOUS

## 2012-02-28 MED ORDER — IOHEXOL 350 MG/ML SOLN
INTRAVENOUS | Status: DC | PRN
  Administered 2012-02-28: 7 mL

## 2012-02-28 MED ORDER — PROMETHAZINE HCL 25 MG/ML IJ SOLN: 6.2500 mg | INTRAMUSCULAR | Status: DC | PRN

## 2012-02-28 MED ORDER — FENTANYL CITRATE 0.05 MG/ML IJ SOLN
25.0000 ug | INTRAMUSCULAR | Status: DC | PRN
  Administered 2012-02-28: 25 ug via INTRAVENOUS

## 2012-02-28 MED ORDER — DEXAMETHASONE SODIUM PHOSPHATE 4 MG/ML IJ SOLN
INTRAMUSCULAR | Status: DC | PRN
  Administered 2012-02-28: 10 mg via INTRAVENOUS

## 2012-02-28 MED ORDER — MIDAZOLAM HCL 5 MG/5ML IJ SOLN
INTRAMUSCULAR | Status: DC | PRN
  Administered 2012-02-28: 1 mg via INTRAVENOUS

## 2012-02-28 MED ORDER — SUCCINYLCHOLINE CHLORIDE 20 MG/ML IJ SOLN
INTRAMUSCULAR | Status: DC | PRN
  Administered 2012-02-28: 100 mg via INTRAVENOUS

## 2012-02-28 MED ORDER — PROPOFOL 10 MG/ML IV EMUL
INTRAVENOUS | Status: DC | PRN
  Administered 2012-02-28: 120 mg via INTRAVENOUS

## 2012-02-28 MED ORDER — NEOSTIGMINE METHYLSULFATE 1 MG/ML IJ SOLN
INTRAMUSCULAR | Status: DC | PRN
  Administered 2012-02-28: 4 mg via INTRAVENOUS

## 2012-02-28 MED ORDER — LIDOCAINE HCL 2 % EX GEL
CUTANEOUS | Status: DC | PRN
  Administered 2012-02-28: 1

## 2012-02-28 MED ORDER — HYDROCODONE-ACETAMINOPHEN 5-325 MG PO TABS
1.0000 | ORAL_TABLET | ORAL | Status: DC | PRN
  Administered 2012-02-28: 1 via ORAL

## 2012-02-28 MED ORDER — LIDOCAINE HCL (CARDIAC) 20 MG/ML IV SOLN
INTRAVENOUS | Status: DC | PRN
  Administered 2012-02-28 (×2): 100 mg via INTRAVENOUS

## 2012-02-28 MED ORDER — FLEET ENEMA 7-19 GM/118ML RE ENEM: 1.0000 | ENEMA | Freq: Once | RECTAL | Status: DC

## 2012-02-28 MED ORDER — GLYCOPYRROLATE 0.2 MG/ML IJ SOLN
INTRAMUSCULAR | Status: DC | PRN
  Administered 2012-02-28: 0.6 mg via INTRAVENOUS
  Administered 2012-02-28: 0.2 mg via INTRAVENOUS

## 2012-02-28 MED ORDER — ONDANSETRON HCL 4 MG/2ML IJ SOLN
INTRAMUSCULAR | Status: DC | PRN
  Administered 2012-02-28: 4 mg via INTRAVENOUS

## 2012-02-28 MED ORDER — LACTATED RINGERS IV SOLN
INTRAVENOUS | Status: DC
  Administered 2012-02-28 (×2): via INTRAVENOUS
  Administered 2012-02-28: 100 mL/h via INTRAVENOUS

## 2012-02-28 MED ORDER — FENTANYL CITRATE 0.05 MG/ML IJ SOLN
INTRAMUSCULAR | Status: DC | PRN
  Administered 2012-02-28 (×3): 25 ug via INTRAVENOUS
  Administered 2012-02-28: 50 ug via INTRAVENOUS
  Administered 2012-02-28: 25 ug via INTRAVENOUS
  Administered 2012-02-28 (×2): 12.5 ug via INTRAVENOUS
  Administered 2012-02-28: 25 ug via INTRAVENOUS

## 2012-02-28 SURGICAL SUPPLY — 25 items
BAG URINE DRAINAGE (UROLOGICAL SUPPLIES) ×3 IMPLANT
BLADE SURG ROTATE 9660 (MISCELLANEOUS) ×3 IMPLANT
CATH FOLEY 2WAY SLVR  5CC 16FR (CATHETERS) ×2
CATH FOLEY 2WAY SLVR 5CC 16FR (CATHETERS) ×4 IMPLANT
CATH ROBINSON RED A/P 20FR (CATHETERS) ×3 IMPLANT
CLOTH BEACON ORANGE TIMEOUT ST (SAFETY) ×3 IMPLANT
COVER MAYO STAND STRL (DRAPES) ×3 IMPLANT
COVER TABLE BACK 60X90 (DRAPES) ×3 IMPLANT
DRSG TEGADERM 4X4.75 (GAUZE/BANDAGES/DRESSINGS) ×4 IMPLANT
DRSG TEGADERM 8X12 (GAUZE/BANDAGES/DRESSINGS) ×3 IMPLANT
GLOVE BIO SURGEON STRL SZ7.5 (GLOVE) ×6 IMPLANT
GLOVE BIOGEL M STRL SZ7.5 (GLOVE) ×4 IMPLANT
GLOVE ECLIPSE 8.0 STRL XLNG CF (GLOVE) IMPLANT
GLOVE INDICATOR 6.5 STRL GRN (GLOVE) ×2 IMPLANT
GOWN STRL REIN XL XLG (GOWN DISPOSABLE) ×3 IMPLANT
GOWN SURGICAL LARGE (GOWNS) ×1 IMPLANT
HOLDER FOLEY CATH W/STRAP (MISCELLANEOUS) ×3 IMPLANT
PACK CYSTOSCOPY (CUSTOM PROCEDURE TRAY) ×3 IMPLANT
SPONGE GAUZE 4X4 12PLY (GAUZE/BANDAGES/DRESSINGS) ×1 IMPLANT
SYRINGE 10CC LL (SYRINGE) ×3 IMPLANT
TOWEL NATURAL 6PK STERILE (DISPOSABLE) ×1 IMPLANT
UNDERPAD 30X30 INCONTINENT (UNDERPADS AND DIAPERS) ×6 IMPLANT
WATER STERILE IRR 3000ML UROMA (IV SOLUTION) ×1 IMPLANT
WATER STERILE IRR 500ML POUR (IV SOLUTION) ×3 IMPLANT
nucletron selectseed ×1 IMPLANT

## 2012-02-28 NOTE — Discharge Instructions (Addendum)
DISCHARGE INSTRUCTIONS FOR PROSTATE SEED IMPLANTATION  Removal of catheter Remove the foley catheter after 24 hours ( day after the procedure).can be done easily by cutting the side port of the catheter, whichallow the balloon to deflate.  You will see 1-2 teaspoons of clear water as the balloon deflates and then the catheter can be slid out without difficulty.        Cut here  Antibiotics You may be given a prescription for an antibiotic to take when you arrive home. If so, be sure to take every tablet in the bottle, even if you are feeling better before the prescription is finished. If you begin itching, notice a rash or start to swell on your trunk, arms, legs and/or throat, immediately stop taking the antibiotic and call your Urologist. Diet Resume your usual diet when you return home. To keep your bowels moving easily and softly, drink prune, apple and cranberry juice at room temperature. You may also take a stool softener, such as Colace, which is available without prescription at local pharmacies. Daily activities   No driving or heavy lifting for at least two days after the implant.   No bike riding, horseback riding or riding lawn mowers for the first month after the implant.   Any strenuous physical activity should be approved by your doctor before you resume it. Sexual relations You may resume sexual relations two weeks after the procedure. A condom should be used for the first two weeks. Your semen may be dark brown or black; this is normal and is related bleeding that may have occurred during the implant. Postoperative swelling Expect swelling and bruising of the scrotum and perineum (the area between the scrotum and anus). Both the swelling and the bruising should resolve in l or 2 weeks. Ice packs and over- the-counter medications such as Tylenol, Advil or Aleve may lessen your discomfort. Postoperative urination Most men experience burning on urination and/or urinary frequency.  If this becomes bothersome, contact your Urologist.  Medication can be prescribed to relieve these problems.  It is normal to have some blood in your urine for a few days after the implant. Special instructions related to the seeds It is unlikely that you will pass an Iodine-125 seed in your urine. The seeds are silver in color and are about as large as a grain of rice. If you pass a seed, do not handle it with your fingers. Use a spoon to place it in an envelope or jar in place this in base occluded area such as the garage or basement for return to the radiation clinic at your convenience.  Contact your doctor for   Temperature greater than 101 F   Increasing pain   Inability to urinate Follow-up  You should have follow up with your urologist and radiation oncologist about 3 weeks after the procedure. General information regarding Iodine seeds   Iodine-125 is a low energy radioactive material. It is not deeply penetrating and loses energy at short distances. Your prostate will absorb the radiation. Objects that are touched or used by the patient do not become radioactive.   Body wastes (urine and stool) or body fluids (saliva, tears, semen or blood) are not radioactive.   The Nuclear Regulatory Commission [facility]) has determined that no radiation precautions are needed for patients undergoing Iodine-125 seed implantation. The [facility] states that such patients do not present a risk to the people around them, including young children and pregnant women. However, in keeping with the general principle  that radiation exposure should be kept as low reasonably possible, we suggest the following:   Children and pets should not sit on the patient's lap for the first two (2) weeks after the implant.   Pregnant (or possibly pregnant) women should avoid prolonged, close contact with the patient for the first two (2) weeks after the implant.   A distance of three (3) feet is acceptable.   At a distance of three (3)  feet, there is no limit to the length of time anyone can be with the patient.       DO NOT TAKE ASPIRIN X 3 DAYS.   MAY RESUME ALL OTHER MEDICATIONS.   DR. Isabel Caprice WILL CALL IN SCRIPT FOR ANTIBIOTIC AND PAIN MED.

## 2012-02-28 NOTE — Anesthesia Procedure Notes (Addendum)
Procedure Name: LMA Insertion Date/Time: 02/28/2012 8:37 AM Performed by: Norva Pavlov Pre-anesthesia Checklist: Patient identified, Emergency Drugs available, Suction available and Patient being monitored Patient Re-evaluated:Patient Re-evaluated prior to inductionOxygen Delivery Method: Circle System Utilized Preoxygenation: Pre-oxygenation with 100% oxygen Intubation Type: IV induction Ventilation: Mask ventilation without difficulty LMA: LMA inserted LMA Size: 5.0 Number of attempts: 1 Airway Equipment and Method: bite block Placement Confirmation: positive ETCO2 Tube secured with: Tape Dental Injury: Teeth and Oropharynx as per pre-operative assessment    Procedure Name: Intubation Date/Time: 02/28/2012 9:45 AM Performed by: Norva Pavlov Pre-anesthesia Checklist: Patient identified, Emergency Drugs available, Suction available and Patient being monitored Patient Re-evaluated:Patient Re-evaluated prior to inductionOxygen Delivery Method: Circle System Utilized Preoxygenation: Pre-oxygenation with 100% oxygen Ventilation: Mask ventilation without difficulty Grade View: Grade I Tube type: Oral Number of attempts: 1 Airway Equipment and Method: stylet and oral airway Placement Confirmation: ETT inserted through vocal cords under direct vision,  positive ETCO2 and breath sounds checked- equal and bilateral Secured at: 22 cm Tube secured with: Tape Dental Injury: Teeth and Oropharynx as per pre-operative assessment  Comments: Patient already aunder GA with LMA in place, IV muscle relaxation given, DL, AOI.

## 2012-02-28 NOTE — Transfer of Care (Signed)
Immediate Anesthesia Transfer of Care Note  Patient: Jonathan Greene  Procedure(s) Performed: Procedure(s) (LRB): RADIOACTIVE SEED IMPLANT (N/A) CYSTOSCOPY (N/A)  Patient Location: PACU  Anesthesia Type: General  Level of Consciousness: awake, alert  and oriented  Airway & Oxygen Therapy: Patient Spontanous Breathing and Patient connected to face mask oxygen  Post-op Assessment: Report given to PACU RN and Post -op Vital signs reviewed and stable  Post vital signs: Reviewed and stable  Complications: No apparent anesthesia complications

## 2012-02-28 NOTE — Anesthesia Postprocedure Evaluation (Signed)
  Anesthesia Post-op Note  Patient: Jonathan Greene  Procedure(s) Performed: Procedure(s) (LRB): RADIOACTIVE SEED IMPLANT (N/A) CYSTOSCOPY (N/A)  Patient Location: PACU  Anesthesia Type: General  Level of Consciousness: awake and alert   Airway and Oxygen Therapy: Patient Spontanous Breathing  Post-op Pain: mild  Post-op Assessment: Post-op Vital signs reviewed, Patient's Cardiovascular Status Stable, Respiratory Function Stable, Patent Airway and No signs of Nausea or vomiting  Post-op Vital Signs: stable  Complications: No apparent anesthesia complications

## 2012-02-28 NOTE — Interval H&P Note (Signed)
History and Physical Interval Note:  02/28/2012 8:09 AM  Jonathan Greene  has presented today for surgery, with the diagnosis of PROSTATE CANCER  The various methods of treatment have been discussed with the patient and family. After consideration of risks, benefits and other options for treatment, the patient has consented to  Procedure(s) (LRB): RADIOACTIVE SEED IMPLANT (N/A) as a surgical intervention .  The patients' history has been reviewed, patient examined, no change in status, stable for surgery.  I have reviewed the patients' chart and labs.  Questions were answered to the patient's satisfaction.     Latora Quarry S

## 2012-02-28 NOTE — Op Note (Signed)
Preoperative diagnosis: Clinical stage TI C adenocarcinoma the prostate  Postoperative diagnosis: Same  Procedure: I-125 prostate seed implantation with Nucletron robotic implanter  Surgeon: Valetta Fuller M.D. , Chipper Herb, M.D.  Anesthesia: Gen.  Indications: Patient  was diagnosed with clinical stage TI C prostate cancer. We had extensive discussion with him about treatment options versus. He elected to proceed with external beam radiation therapy seed boost. He underwent consultation my office as well as with Dr. Chipper Herb. He appeared to understand the advantages disadvantages potential risks of this treatment option. Full informed consent has been obtained. The patient is had preoperative ciprofloxacin. PAS compression boots were placed.  Technique and findings: Patient was brought the operating room where he had successful induction of general anesthesia. He was placed in lithotomy position and prepped and draped in usual manner. Appropriate surgical timeout was performed. Radiation oncology department placed a transrectal ultrasound probe anchoring stand. Foley catheter with contrast in the balloon was inserted without difficulty. Anchoring needles were placed within the prostate. Real-time contouring of the urethra prostate and rectum were performed and the dosing parameters were established. Targeted dose was 110 gray. We then came to the operating suite suite for placement of the needles. A second timeout was performed. All needle passage was done with real-time transrectal ultrasound guidance with the sagittal plane. A total of 24 needles were placed. See implantation itself was done with the robotic implanter. 67 active seeds were implanted. A Foley catheter was removed and flexible cystoscopy failed to show any seeds outside the prostate. The Foley catheter was inserted which drained clear urine. The patient was brought to recovery room in stable condition.

## 2012-02-28 NOTE — Progress Notes (Signed)
geiger survey negative

## 2012-02-28 NOTE — Anesthesia Preprocedure Evaluation (Signed)
Anesthesia Evaluation  Patient identified by MRN, date of birth, ID band Patient awake    Reviewed: Allergy & Precautions, H&P , NPO status , Patient's Chart, lab work & pertinent test results  Airway Mallampati: II TM Distance: >3 FB Neck ROM: Full    Dental No notable dental hx.    Pulmonary neg pulmonary ROS,  breath sounds clear to auscultation  Pulmonary exam normal       Cardiovascular hypertension, Pt. on medications Rhythm:Regular Rate:Normal     Neuro/Psych negative neurological ROS  negative psych ROS   GI/Hepatic Neg liver ROS, GERD-  Medicated and Controlled,  Endo/Other  negative endocrine ROS  Renal/GU negative Renal ROS  negative genitourinary   Musculoskeletal negative musculoskeletal ROS (+)   Abdominal   Peds negative pediatric ROS (+)  Hematology negative hematology ROS (+)   Anesthesia Other Findings   Reproductive/Obstetrics negative OB ROS                           Anesthesia Physical Anesthesia Plan  ASA: II  Anesthesia Plan: General   Post-op Pain Management:    Induction: Intravenous  Airway Management Planned: LMA  Additional Equipment:   Intra-op Plan:   Post-operative Plan: Extubation in OR  Informed Consent: I have reviewed the patients History and Physical, chart, labs and discussed the procedure including the risks, benefits and alternatives for the proposed anesthesia with the patient or authorized representative who has indicated his/her understanding and acceptance.   Dental advisory given  Plan Discussed with: CRNA  Anesthesia Plan Comments:         Anesthesia Quick Evaluation

## 2012-02-28 NOTE — Progress Notes (Signed)
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  PROCEDURE: Insertion of radioactive I-125 seeds into the prostate gland.  RADIATION DOSE: 11,000 Gy, boost therapy.  TECHNIQUE: Jonathan Greene was brought to the operating room with Dr. Isabel Caprice. He was placed in the dorsolithotomy position. He was catheterized and a rectal tube was inserted. The perineum was shaved, prepped and draped. The ultrasound probe was then introduced into the rectum to see the prostate gland.  TREATMENT DEVICE: A needle grid was attached to the ultrasound probe stand and anchor needles were placed.24  COMPLEX ISODOSE CALCULATION: The prostate was imaged in 3D using a sagittal sweep of the prostate probe. These images were transferred to the planning computer. There, the prostate, urethra and rectum were defined on each axial reconstructed image. Then, the software created an optimized plan and a few seed positions were adjusted. Then the accepted plan was uploaded to the seed Selectron afterloading unit.  SPECIAL TREATMENT PROCEDURE/SUPERVISION AND HANDLING: The Nucletron FIRST system was used to place the needles under sagittal guidance. A total of 24 needles were used to deposit 67 seeds in the prostate gland. The individual seed activity was 0.30 mCi for a total implant activity of 20.1 mCi.  COMPLEX SIMULATION: At the end of the procedure, an anterior radiograph of the pelvis was obtained to document seed positioning and count. Cystoscopy was performed to check the urethra and bladder.  MICRODOSIMETRY: At the end of the procedure, the patient was emitting 0.3 mrem/hr at 1 meter. Accordingly, he was considered safe for hospital  discharge.  PLAN: The patient will return to the radiation oncology clinic for post implant CT dosimetry in three weeks.

## 2012-02-29 ENCOUNTER — Encounter (HOSPITAL_BASED_OUTPATIENT_CLINIC_OR_DEPARTMENT_OTHER): Payer: Self-pay | Admitting: Urology

## 2012-02-29 ENCOUNTER — Encounter: Payer: Self-pay | Admitting: Radiation Oncology

## 2012-02-29 NOTE — Progress Notes (Signed)
[facility] Radiation Oncology End of Treatment Note  Name:Jonathan Greene  Date:02/29/2012           ZOX:[id number] DOB:August 19, 1941   Status:outpatient    CC: Valetta Fuller, MD, MD    REFERRING PHYSICIAN: Dr. 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  Seed implant boost date 02/28/2012                          SITE/DOSE:    Prostate 4500 cGy 25 sessions, external beam, 11,000 cGy prostate seed implant boost                        BEAMS/ENERGY:   15 MV photons, 4 field technique with 3-D conformal planning for his external beam, and I-125 seeds for his prostate seed implant boost. A total of 67 seeds were utilized with an individual seed activity of 0.3 mCi per seed for a total implant activity of 20.1 mCi.                NARRATIVE:   The patient appears to have successfully completed his radiation therapy.                         PLAN: Routine followup in one month. Patient instructed to call if questions or worsening complaints in interim.

## 2012-03-18 ENCOUNTER — Telehealth: Payer: Self-pay | Admitting: *Deleted

## 2012-03-18 NOTE — Telephone Encounter (Signed)
XXXX 

## 2012-03-18 NOTE — Telephone Encounter (Signed)
CALLED PATIENT TO REMIND OF APPT. FOR 03-19-12, CONFIRMED APPT. WITH PATIENT

## 2012-03-19 ENCOUNTER — Ambulatory Visit
Admission: RE | Admit: 2012-03-19 | Discharge: 2012-03-19 | Disposition: A | Discharge status: Home / Self Care | Payer: Medicare Other | Source: Ambulatory Visit | Attending: Radiation Oncology | Admitting: Radiation Oncology

## 2012-03-19 ENCOUNTER — Encounter: Payer: Self-pay | Admitting: Radiation Oncology

## 2012-03-19 VITALS — BP 153/85 | HR 42 | Resp 18 | Wt 218.6 lb

## 2012-03-19 DIAGNOSIS — Z510 Encounter for antineoplastic radiation therapy: Secondary | ICD-10-CM | POA: Insufficient documentation

## 2012-03-19 DIAGNOSIS — C61 Malignant neoplasm of prostate: Secondary | ICD-10-CM

## 2012-03-19 DIAGNOSIS — Z79899 Other long term (current) drug therapy: Secondary | ICD-10-CM | POA: Insufficient documentation

## 2012-03-19 NOTE — Progress Notes (Signed)
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 He saw Dr. Isabel Caprice earlier today. He does have some slowing of his urinary stream with some urinary frequency and slight dysuria. Dr. Isabel Caprice told him to continue with his Flomax. He was seen for a followup visit in approximately 3 months.  Physical examination not performed today.  His CT scan for his post implant dosimetry shows an excellent seed distribution.  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  Plan: He'll maintain his followup to Dr. Isabel Caprice. Dr. Isabel Caprice will keep me posted on his followup.

## 2012-03-19 NOTE — Progress Notes (Signed)
Patient presents to the clinic today unaccompanied for a post seed evaluation with Dr. Dayton Scrape. Patient is alert and oriented to person, place, and time. No distress noted. Steady gait noted. Pleasant affect noted. Patient denies pain at this time. Patient reports burning with urination but, confirms it has "improved greatly." patient reports a weak urine stream. Patient reports that Dr. Isabel Caprice renewed his flomax script today and wants to see him back in 3 months. Patient denies hematuria. Patient reports that on average he gets up twice per night to void. Reported all findings to Dr. Dayton Scrape.

## 2012-03-19 NOTE — Progress Notes (Signed)
Complex simulation note:  The patient was taken to the CT simulator. His pelvis was scanned. The CT dataset is being transferred to the [facility] planning system for contouring of  the prostate and rectum. He will then undergo 3-D simulation to assess the quality of his implant. There appears to be a good seed distribution.

## 2012-03-25 ENCOUNTER — Encounter: Payer: Self-pay | Admitting: Radiation Oncology

## 2012-03-25 NOTE — Progress Notes (Signed)
Post implant CT dosimetry/3-D simulation note:  Jonathan Greene underwent post-implant CT dosimetry/3-D simulation to assess the quality of his implant. His intraoperative prostate volume by ultrasound was 31.2 cc while his postoperative prostate volume by CT was 28.9 cc, a good correlation. Dose volume histograms were obtained for the prostate and rectum. His prostate D 90 is 98.73% and V1 100 89.5%, both excellent. 0.13 cc of rectum received the prescribed dose of 11,000 cGy.  In summary, Jonathan Greene has excellent post implant dosimetry with a low-risk for late rectal toxicity.

## 2012-10-10 ENCOUNTER — Encounter: Payer: Self-pay | Admitting: *Deleted

## 2013-09-23 ENCOUNTER — Other Ambulatory Visit (HOSPITAL_COMMUNITY): Payer: Self-pay | Admitting: Family Medicine

## 2013-09-23 DIAGNOSIS — I10 Essential (primary) hypertension: Secondary | ICD-10-CM

## 2013-09-23 DIAGNOSIS — Z139 Encounter for screening, unspecified: Secondary | ICD-10-CM

## 2013-09-23 DIAGNOSIS — E785 Hyperlipidemia, unspecified: Secondary | ICD-10-CM

## 2013-09-29 ENCOUNTER — Ambulatory Visit (HOSPITAL_COMMUNITY): Payer: Medicare Other

## 2013-10-27 ENCOUNTER — Encounter (HOSPITAL_COMMUNITY): Payer: Self-pay | Admitting: Pharmacy Technician

## 2013-10-28 ENCOUNTER — Encounter (HOSPITAL_COMMUNITY)
Admission: RE | Admit: 2013-10-28 | Discharge: 2013-10-28 | Disposition: A | Discharge status: Home / Self Care | Payer: Medicare Other | Source: Ambulatory Visit | Attending: Ophthalmology | Admitting: Ophthalmology

## 2013-10-28 ENCOUNTER — Encounter (HOSPITAL_COMMUNITY): Payer: Self-pay

## 2013-10-28 DIAGNOSIS — Z01812 Encounter for preprocedural laboratory examination: Secondary | ICD-10-CM | POA: Insufficient documentation

## 2013-10-28 DIAGNOSIS — Z01810 Encounter for preprocedural cardiovascular examination: Secondary | ICD-10-CM | POA: Insufficient documentation

## 2013-10-28 DIAGNOSIS — Z01818 Encounter for other preprocedural examination: Secondary | ICD-10-CM | POA: Insufficient documentation

## 2013-10-28 LAB — BASIC METABOLIC PANEL
CO2: 28 mEq/L (ref 19–32)
Chloride: 103 mEq/L (ref 96–112)
Creatinine, Ser: 1.16 mg/dL (ref 0.50–1.35)
Sodium: 140 mEq/L (ref 135–145)

## 2013-10-28 LAB — HEMOGLOBIN AND HEMATOCRIT, BLOOD
HCT: 45.2 % (ref 39.0–52.0)
Hemoglobin: 15.2 g/dL (ref 13.0–17.0)

## 2013-10-28 NOTE — Patient Instructions (Signed)
Your procedure is scheduled on: 11/06/2013  Report to [facility] at  1200  PM.  Call this number if you have problems the morning of surgery: [phone]   Do not eat food or drink liquids :After Midnight.      Take these medicines the morning of surgery with A SIP OF WATER: flomax, lisinopril, prilosec   Do not wear jewelry, make-up or nail polish.  Do not wear lotions, powders, or perfumes.   Do not shave 48 hours prior to surgery.  Do not bring valuables to the hospital.  Contacts, dentures or bridgework may not be worn into surgery.  Leave suitcase in the car. After surgery it may be brought to your room.  For patients admitted to the hospital, checkout time is 11:00 AM the day of discharge.   Patients discharged the day of surgery will not be allowed to drive home.  :     Please read over the following fact sheets that you were given: Coughing and Deep Breathing, Surgical Site Infection Prevention, Anesthesia Post-op Instructions and Care and Recovery After Surgery    Cataract A cataract is a clouding of the lens of the eye. When a lens becomes cloudy, vision is reduced based on the degree and nature of the clouding. Many cataracts reduce vision to some degree. Some cataracts make people more near-sighted as they develop. Other cataracts increase glare. Cataracts that are ignored and become worse can sometimes look white. The white color can be seen through the pupil. CAUSES   Aging. However, cataracts may occur at any age, even in newborns.   Certain drugs.   Trauma to the eye.   Certain diseases such as diabetes.   Specific eye diseases such as chronic inflammation inside the eye or a sudden attack of a rare form of glaucoma.   Inherited or acquired medical problems.  SYMPTOMS   Gradual, progressive drop in vision in the affected eye.   Severe, rapid visual loss. This most often happens when trauma is the cause.  DIAGNOSIS  To detect a cataract, an eye doctor examines  the lens. Cataracts are best diagnosed with an exam of the eyes with the pupils enlarged (dilated) by drops.  TREATMENT  For an early cataract, vision may improve by using different eyeglasses or stronger lighting. If that does not help your vision, surgery is the only effective treatment. A cataract needs to be surgically removed when vision loss interferes with your everyday activities, such as driving, reading, or watching TV. A cataract may also have to be removed if it prevents examination or treatment of another eye problem. Surgery removes the cloudy lens and usually replaces it with a substitute lens (intraocular lens, IOL).  At a time when both you and your doctor agree, the cataract will be surgically removed. If you have cataracts in both eyes, only one is usually removed at a time. This allows the operated eye to heal and be out of danger from any possible problems after surgery (such as infection or poor wound healing). In rare cases, a cataract may be doing damage to your eye. In these cases, your caregiver may advise surgical removal right away. The vast majority of people who have cataract surgery have better vision afterward. HOME CARE INSTRUCTIONS  If you are not planning surgery, you may be asked to do the following:  Use different eyeglasses.   Use stronger or brighter lighting.   Ask your eye doctor about reducing your medicine dose or changing  medicines if it is thought that a medicine caused your cataract. Changing medicines does not make the cataract go away on its own.   Become familiar with your surroundings. Poor vision can lead to injury. Avoid bumping into things on the affected side. You are at a higher risk for tripping or falling.   Exercise extreme care when driving or operating machinery.   Wear sunglasses if you are sensitive to bright light or experiencing problems with glare.  SEEK IMMEDIATE MEDICAL CARE IF:   You have a worsening or sudden vision loss.    You notice redness, swelling, or increasing pain in the eye.   You have a fever.  Document Released: 11/20/2005 Document Revised: 11/09/2011 Document Reviewed: 07/14/2011 [facility] Patient Information 2012 [place], [place].PATIENT INSTRUCTIONS POST-ANESTHESIA  IMMEDIATELY FOLLOWING SURGERY:  Do not drive or operate machinery for the first twenty four hours after surgery.  Do not make any important decisions for twenty four hours after surgery or while taking narcotic pain medications or sedatives.  If you develop intractable nausea and vomiting or a severe headache please notify your doctor immediately.  FOLLOW-UP:  Please make an appointment with your surgeon as instructed. You do not need to follow up with anesthesia unless specifically instructed to do so.  WOUND CARE INSTRUCTIONS (if applicable):  Keep a dry clean dressing on the anesthesia/puncture wound site if there is drainage.  Once the wound has quit draining you may leave it open to air.  Generally you should leave the bandage intact for twenty four hours unless there is drainage.  If the epidural site drains for more than 36-48 hours please call the anesthesia department.  QUESTIONS?:  Please feel free to call your physician or the hospital operator if you have any questions, and they will be happy to assist you.

## 2013-11-06 ENCOUNTER — Encounter (HOSPITAL_COMMUNITY)
Admission: RE | Disposition: A | Discharge status: Home / Self Care | Payer: Self-pay | Source: Ambulatory Visit | Attending: Ophthalmology

## 2013-11-06 ENCOUNTER — Encounter (HOSPITAL_COMMUNITY): Payer: Medicare Other | Admitting: Anesthesiology

## 2013-11-06 ENCOUNTER — Encounter (HOSPITAL_COMMUNITY): Payer: Self-pay | Admitting: *Deleted

## 2013-11-06 ENCOUNTER — Ambulatory Visit (HOSPITAL_COMMUNITY): Payer: Medicare Other | Admitting: Anesthesiology

## 2013-11-06 ENCOUNTER — Ambulatory Visit (HOSPITAL_COMMUNITY)
Admission: RE | Admit: 2013-11-06 | Discharge: 2013-11-06 | Disposition: A | Discharge status: Home / Self Care | Payer: Medicare Other | Source: Ambulatory Visit | Attending: Ophthalmology | Admitting: Ophthalmology

## 2013-11-06 DIAGNOSIS — H2510 Age-related nuclear cataract, unspecified eye: Secondary | ICD-10-CM | POA: Insufficient documentation

## 2013-11-06 DIAGNOSIS — H2181 Floppy iris syndrome: Secondary | ICD-10-CM | POA: Insufficient documentation

## 2013-11-06 DIAGNOSIS — I10 Essential (primary) hypertension: Secondary | ICD-10-CM | POA: Insufficient documentation

## 2013-11-06 DIAGNOSIS — Z7982 Long term (current) use of aspirin: Secondary | ICD-10-CM | POA: Insufficient documentation

## 2013-11-06 HISTORY — PX: CATARACT EXTRACTION W/PHACO: SHX586

## 2013-11-06 SURGERY — PHACOEMULSIFICATION, CATARACT, WITH IOL INSERTION
Anesthesia: Monitor Anesthesia Care | Site: Eye | Laterality: Right

## 2013-11-06 MED ORDER — EPINEPHRINE HCL 1 MG/ML IJ SOLN
INTRAMUSCULAR | Status: AC
  Filled 2013-11-06: qty 2

## 2013-11-06 MED ORDER — BSS IO SOLN
INTRAOCULAR | Status: DC | PRN
  Administered 2013-11-06: 15 mL via INTRAOCULAR

## 2013-11-06 MED ORDER — MIDAZOLAM HCL 2 MG/2ML IJ SOLN
1.0000 mg | INTRAMUSCULAR | Status: DC | PRN
  Administered 2013-11-06: 2 mg via INTRAVENOUS

## 2013-11-06 MED ORDER — LACTATED RINGERS IV SOLN
INTRAVENOUS | Status: DC
  Administered 2013-11-06: 13:00:00 via INTRAVENOUS

## 2013-11-06 MED ORDER — MIDAZOLAM HCL 2 MG/2ML IJ SOLN
INTRAMUSCULAR | Status: AC
  Filled 2013-11-06: qty 2

## 2013-11-06 MED ORDER — FENTANYL CITRATE 0.05 MG/ML IJ SOLN
INTRAMUSCULAR | Status: AC
  Filled 2013-11-06: qty 2

## 2013-11-06 MED ORDER — FENTANYL CITRATE 0.05 MG/ML IJ SOLN: 25.0000 ug | INTRAMUSCULAR | Status: DC | PRN

## 2013-11-06 MED ORDER — PHENYLEPHRINE HCL 2.5 % OP SOLN
1.0000 [drp] | OPHTHALMIC | Status: AC
  Administered 2013-11-06 (×3): 1 [drp] via OPHTHALMIC

## 2013-11-06 MED ORDER — LIDOCAINE HCL (PF) 1 % IJ SOLN
INTRAOCULAR | Status: DC | PRN
  Administered 2013-11-06: 14:00:00

## 2013-11-06 MED ORDER — CYCLOPENTOLATE-PHENYLEPHRINE OP SOLN OPTIME - NO CHARGE
OPHTHALMIC | Status: AC
  Filled 2013-11-06: qty 2

## 2013-11-06 MED ORDER — PROVISC 10 MG/ML IO SOLN
INTRAOCULAR | Status: DC | PRN
  Administered 2013-11-06: 0.85 mL via INTRAOCULAR

## 2013-11-06 MED ORDER — LIDOCAINE HCL 3.5 % OP GEL
1.0000 "application " | Freq: Once | OPHTHALMIC | Status: AC
  Administered 2013-11-06: 1 via OPHTHALMIC

## 2013-11-06 MED ORDER — LIDOCAINE HCL (PF) 1 % IJ SOLN
INTRAMUSCULAR | Status: AC
  Filled 2013-11-06: qty 2

## 2013-11-06 MED ORDER — FENTANYL CITRATE 0.05 MG/ML IJ SOLN
25.0000 ug | INTRAMUSCULAR | Status: AC
  Administered 2013-11-06 (×2): 25 ug via INTRAVENOUS

## 2013-11-06 MED ORDER — NEOMYCIN-POLYMYXIN-DEXAMETH 3.5-10000-0.1 OP SUSP
OPHTHALMIC | Status: AC
  Filled 2013-11-06: qty 5

## 2013-11-06 MED ORDER — TETRACAINE HCL 0.5 % OP SOLN
OPHTHALMIC | Status: AC
  Filled 2013-11-06: qty 2

## 2013-11-06 MED ORDER — CYCLOPENTOLATE-PHENYLEPHRINE 0.2-1 % OP SOLN
1.0000 [drp] | OPHTHALMIC | Status: AC
  Administered 2013-11-06 (×3): 1 [drp] via OPHTHALMIC

## 2013-11-06 MED ORDER — POVIDONE-IODINE 5 % OP SOLN
OPHTHALMIC | Status: DC | PRN
  Administered 2013-11-06: 1 via OPHTHALMIC

## 2013-11-06 MED ORDER — PHENYLEPHRINE HCL 2.5 % OP SOLN
OPHTHALMIC | Status: AC
  Filled 2013-11-06: qty 15

## 2013-11-06 MED ORDER — TETRACAINE HCL 0.5 % OP SOLN
1.0000 [drp] | OPHTHALMIC | Status: AC
  Administered 2013-11-06 (×3): 1 [drp] via OPHTHALMIC

## 2013-11-06 MED ORDER — NEOMYCIN-POLYMYXIN-DEXAMETH 3.5-10000-0.1 OP SUSP
OPHTHALMIC | Status: DC | PRN
  Administered 2013-11-06: 2 [drp] via OPHTHALMIC

## 2013-11-06 MED ORDER — EPINEPHRINE HCL 1 MG/ML IJ SOLN
INTRAOCULAR | Status: DC | PRN
  Administered 2013-11-06: 14:00:00

## 2013-11-06 MED ORDER — ONDANSETRON HCL 4 MG/2ML IJ SOLN: 4.0000 mg | Freq: Once | INTRAMUSCULAR | Status: DC | PRN

## 2013-11-06 SURGICAL SUPPLY — 32 items

## 2013-11-06 NOTE — Anesthesia Postprocedure Evaluation (Signed)
  Anesthesia Post-op Note  Patient: Jonathan Greene  Procedure(s) Performed: Procedure(s) with comments: CATARACT EXTRACTION PHACO AND INTRAOCULAR LENS PLACEMENT (IOC) (Right) - CDE 11.67  Patient Location: Short Stay  Anesthesia Type:MAC  Level of Consciousness: awake, alert , oriented and patient cooperative  Airway and Oxygen Therapy: Patient Spontanous Breathing  Post-op Pain: none  Post-op Assessment: Post-op Vital signs reviewed, Patient's Cardiovascular Status Stable, Respiratory Function Stable, Patent Airway and Pain level controlled  Post-op Vital Signs: Reviewed and stable  Complications: No apparent anesthesia complications

## 2013-11-06 NOTE — H&P (Signed)
I have reviewed the H&P, the patient was re-examined, and I have identified no interval changes in medical condition and plan of care since the history and physical of record  

## 2013-11-06 NOTE — Op Note (Signed)
Date of Admission: 11/06/2013  Date of Surgery: 11/06/2013  Pre-Op Dx: Cataract  Right  Eye  Post-Op Dx: Cataract  Right  Eye,  Dx Code 366.16, Intraoperative Floppy Iris Syndrome, Dx Code 364.81  Surgeon: Gemma Payor, M.D.  Assistants: None  Anesthesia: Topical with MAC  Indications: Painless, progressive loss of vision with compromise of daily activities.  Surgery: Cataract Extraction with Intraocular lens Implant Right Eye  Discription: The patient had dilating drops and viscous lidocaine placed into the right eye in the pre-op holding area. After transfer to the operating room, a time out was performed. The patient was then prepped and draped. Beginning with a 75 degree blade a paracentesis port was made at the surgeon's 2 o'clock position. The anterior chamber was then filled with 1% non-preserved lidocaine with epinepherine. This was followed by filling the anterior chamber with Provisc.  A 2.91mm keratome blade was used to make a clear corneal incision at the temporal limbus.  A bent cystatome needle was used to create a continuous tear capsulotomy. Hydrodissection was performed with balanced salt solution on a Fine canula. The lens nucleus was then removed using the phacoemulsification handpiece. Residual cortex was removed with the I&A handpiece. The anterior chamber and capsular bag were refilled with Provisc. A posterior chamber intraocular lens was placed into the capsular bag with it's injector. The implant was positioned with the Kuglan hook. The Provisc was then removed from the anterior chamber and capsular bag with the I&A handpiece. Stromal hydration of the main incision and paracentesis port was performed with BSS on a Fine canula. The wounds were tested for leak which was negative. The patient tolerated the procedure well. There were no operative complications. The patient was then transferred to the recovery room in stable condition.  Complications: None  Specimen: None  EBL:  None  Prosthetic device: B&L enVista, MX60, power 19.5, SN [id number].

## 2013-11-06 NOTE — Anesthesia Preprocedure Evaluation (Signed)
Anesthesia Evaluation  Patient identified by MRN, date of birth, ID band Patient awake    Reviewed: Allergy & Precautions, H&P , NPO status , Patient's Chart, lab work & pertinent test results  Airway Mallampati: II TM Distance: >3 FB Neck ROM: Full    Dental no notable dental hx. (+) Teeth Intact   Pulmonary neg pulmonary ROS, former smoker,  breath sounds clear to auscultation  Pulmonary exam normal       Cardiovascular hypertension, Pt. on medications Rhythm:Regular Rate:Normal     Neuro/Psych negative neurological ROS  negative psych ROS   GI/Hepatic Neg liver ROS, GERD-  Medicated and Controlled,  Endo/Other  negative endocrine ROS  Renal/GU negative Renal ROS  negative genitourinary   Musculoskeletal negative musculoskeletal ROS (+)   Abdominal   Peds negative pediatric ROS (+)  Hematology negative hematology ROS (+)   Anesthesia Other Findings   Reproductive/Obstetrics negative OB ROS                           Anesthesia Physical Anesthesia Plan  ASA: III  Anesthesia Plan: MAC   Post-op Pain Management:    Induction: Intravenous  Airway Management Planned: Nasal Cannula  Additional Equipment:   Intra-op Plan:   Post-operative Plan:   Informed Consent: I have reviewed the patients History and Physical, chart, labs and discussed the procedure including the risks, benefits and alternatives for the proposed anesthesia with the patient or authorized representative who has indicated his/her understanding and acceptance.     Plan Discussed with:   Anesthesia Plan Comments:         Anesthesia Quick Evaluation

## 2013-11-06 NOTE — Transfer of Care (Signed)
Immediate Anesthesia Transfer of Care Note  Patient: Jonathan Greene  Procedure(s) Performed: Procedure(s) with comments: CATARACT EXTRACTION PHACO AND INTRAOCULAR LENS PLACEMENT (IOC) (Right) - CDE 11.67  Patient Location: Short Stay  Anesthesia Type:MAC  Level of Consciousness: awake, alert , oriented and patient cooperative  Airway & Oxygen Therapy: Patient Spontanous Breathing  Post-op Assessment: Report given to PACU RN, Post -op Vital signs reviewed and stable and Patient moving all extremities  Post vital signs: Reviewed and stable  Complications: No apparent anesthesia complications

## 2013-11-10 ENCOUNTER — Encounter (HOSPITAL_COMMUNITY): Payer: Self-pay | Admitting: Ophthalmology

## 2013-12-11 ENCOUNTER — Emergency Department (HOSPITAL_COMMUNITY)
Admission: EM | Admit: 2013-12-11 | Discharge: 2013-12-11 | Disposition: A | Discharge status: Other Acute Inpatient Hospital | Payer: Medicare Other | Attending: Emergency Medicine | Admitting: Emergency Medicine

## 2013-12-11 ENCOUNTER — Encounter (HOSPITAL_COMMUNITY): Payer: Self-pay | Admitting: Emergency Medicine

## 2013-12-11 ENCOUNTER — Emergency Department (HOSPITAL_COMMUNITY): Payer: Medicare Other

## 2013-12-11 DIAGNOSIS — Z87448 Personal history of other diseases of urinary system: Secondary | ICD-10-CM | POA: Insufficient documentation

## 2013-12-11 DIAGNOSIS — Z79899 Other long term (current) drug therapy: Secondary | ICD-10-CM | POA: Insufficient documentation

## 2013-12-11 DIAGNOSIS — I498 Other specified cardiac arrhythmias: Secondary | ICD-10-CM | POA: Insufficient documentation

## 2013-12-11 DIAGNOSIS — E7800 Pure hypercholesterolemia, unspecified: Secondary | ICD-10-CM | POA: Insufficient documentation

## 2013-12-11 DIAGNOSIS — Z8546 Personal history of malignant neoplasm of prostate: Secondary | ICD-10-CM | POA: Insufficient documentation

## 2013-12-11 DIAGNOSIS — K219 Gastro-esophageal reflux disease without esophagitis: Secondary | ICD-10-CM | POA: Insufficient documentation

## 2013-12-11 DIAGNOSIS — Z7982 Long term (current) use of aspirin: Secondary | ICD-10-CM | POA: Insufficient documentation

## 2013-12-11 DIAGNOSIS — I7771 Dissection of carotid artery: Secondary | ICD-10-CM | POA: Insufficient documentation

## 2013-12-11 DIAGNOSIS — I10 Essential (primary) hypertension: Secondary | ICD-10-CM | POA: Insufficient documentation

## 2013-12-11 DIAGNOSIS — Z87891 Personal history of nicotine dependence: Secondary | ICD-10-CM | POA: Insufficient documentation

## 2013-12-11 LAB — TROPONIN I: Troponin I: <0.3 ng/mL (ref <0.30)

## 2013-12-11 LAB — CBC WITH DIFFERENTIAL/PLATELET
BASOS ABS: 0 10*3/uL (ref 0.0–0.1)
BASOS PCT: 0 % (ref 0–1)
EOS PCT: 2 % (ref 0–5)
Eosinophils Absolute: 0.2 10*3/uL (ref 0.0–0.7)
HEMATOCRIT: 46.4 % (ref 39.0–52.0)
HEMOGLOBIN: 15.7 g/dL (ref 13.0–17.0)
Lymphocytes Relative: 19 % (ref 12–46)
Lymphs Abs: 1.7 10*3/uL (ref 0.7–4.0)
MCH: 31 pg (ref 26.0–34.0)
MCHC: 33.8 g/dL (ref 30.0–36.0)
MCV: 91.5 fL (ref 78.0–100.0)
MONO ABS: 0.8 10*3/uL (ref 0.1–1.0)
MONOS PCT: 10 % (ref 3–12)
NEUTROS ABS: 5.9 10*3/uL (ref 1.7–7.7)
Neutrophils Relative %: 69 % (ref 43–77)
Platelets: 167 10*3/uL (ref 150–400)
RBC: 5.07 MIL/uL (ref 4.22–5.81)
RDW: 13.6 % (ref 11.5–15.5)
WBC: 8.6 10*3/uL (ref 4.0–10.5)

## 2013-12-11 LAB — BASIC METABOLIC PANEL
BUN: 20 mg/dL (ref 6–23)
CHLORIDE: 98 meq/L (ref 96–112)
CO2: 21 meq/L (ref 19–32)
CREATININE: 1.18 mg/dL (ref 0.50–1.35)
Calcium: 9.5 mg/dL (ref 8.4–10.5)
GFR calc Af Amer: 70 mL/min — ABNORMAL LOW (ref >90)
GFR calc non Af Amer: 60 mL/min — ABNORMAL LOW (ref >90)
Glucose, Bld: 112 mg/dL — ABNORMAL HIGH (ref 70–99)
Potassium: 4.8 mEq/L (ref 3.7–5.3)
Sodium: 132 mEq/L — ABNORMAL LOW (ref 137–147)

## 2013-12-11 MED ORDER — IOHEXOL 350 MG/ML SOLN
100.0000 mL | Freq: Once | INTRAVENOUS | Status: AC | PRN
  Administered 2013-12-11: 75 mL via INTRAVENOUS

## 2013-12-11 NOTE — ED Provider Notes (Addendum)
CSN: [id number]     Arrival date & time 12/11/13  1109 History   First MD Initiated Contact with Patient 12/11/13 1206     Chief Complaint  Patient presents with  . Dizziness    HPI  Patient presents with dizziness. He states his symptoms started yesterday evening. He felt like he was moving around while he was sitting in his chair. Denies that he felt the room were spinning. He got up to walk and had to hold onto things because of "off-balance". Symptoms persisted through the night. He awakened at 2 AM. Describes mild posterior occipital headache he rates as a 1 or 2/10. His dizziness has persisted through the day. No nausea. No vision changes. He presents here. Not syncopal lightheaded or orthostatic. Not febrile. No falls or injuries. Has a history of bradycardia. This  has been studied extensively. On Holter monitoring his heart rate is as low as 29 at night. He is asymptomatic to the bradycardia.Marland Kitchen He states his cardiologist as discussed pacemaker with him and he has declined because he has never been symptomatic with syncope/orthostasis.  He is concerned because a lot of family members on his mother's side had "aneurysm".  Past Medical History  Diagnosis Date  . Hypercholesterolemia   . Hypertension   . Bradycardia   . Frequent urination   . Urinary urgency   . GERD (gastroesophageal reflux disease)   . Prostate cancer     RADIATION TX AND SCHEDULED FOR SEED IMPLANTS 02-28-12   Past Surgical History  Procedure Laterality Date  . Radioactive seed implant  02/28/2012    Procedure: RADIOACTIVE SEED IMPLANT;  Surgeon: Bernestine Amass, MD;  Location: [facility];  Service: Urology;  Laterality: N/A;  67seeds implanted   . Cystoscopy  02/28/2012    Procedure: CYSTOSCOPY;  Surgeon: Bernestine Amass, MD;  Location: [facility];  Service: Urology;  Laterality: N/A;   no seeds noted in bladder  . Cataract extraction w/phaco Right 11/06/2013    Procedure: CATARACT  EXTRACTION PHACO AND INTRAOCULAR LENS PLACEMENT (IOC);  Surgeon: Tonny Branch, MD;  Location: [facility];  Service: Ophthalmology;  Laterality: Right;  CDE 11.67   Family History  Problem Relation Age of Onset  . Stroke Mother   . Cancer Neg Hx    History  Substance Use Topics  . Smoking status: Former Smoker -- 2.00 packs/day for 35 years    Types: Cigarettes    Quit date: 02/21/1991  . Smokeless tobacco: Current User    Types: Chew     Comment: CHEW TOBACCO FOR 20 YRS   . Alcohol Use: 8.4 oz/week    14 Shots of liquor per week     Comment: daily    Review of Systems  Constitutional: Negative for fever, chills, diaphoresis, appetite change and fatigue.  HENT: Negative for mouth sores, sore throat and trouble swallowing.   Eyes: Negative for visual disturbance.  Respiratory: Negative for cough, chest tightness, shortness of breath and wheezing.   Cardiovascular: Negative for chest pain.  Gastrointestinal: Negative for nausea, vomiting, abdominal pain, diarrhea and abdominal distention.  Endocrine: Negative for polydipsia, polyphagia and polyuria.  Genitourinary: Negative for dysuria, frequency and hematuria.  Musculoskeletal: Negative for gait problem.  Skin: Negative for color change, pallor and rash.  Neurological: Positive for dizziness and headaches. Negative for syncope and light-headedness.  Hematological: Does not bruise/bleed easily.  Psychiatric/Behavioral: Negative for behavioral problems and confusion.    Allergies  Review of patient's allergies  indicates no known allergies.  Home Medications   Current Outpatient Rx  Name  Route  Sig  Dispense  Refill  . aspirin 81 MG tablet   Oral   Take 81 mg by mouth daily.          . Cyanocobalamin (VITAMIN B-12) 1000 MCG/15ML LIQD   Oral   Take 15 mLs by mouth daily.         . fish oil-omega-3 fatty acids 1000 MG capsule   Oral   Take 1 g by mouth daily.          Marland Kitchen lisinopril (PRINIVIL,ZESTRIL) 20 MG tablet    Oral   Take 20 mg by mouth daily.          . magnesium oxide (MAG-OX) 400 MG tablet   Oral   Take 400 mg by mouth daily.         Marland Kitchen omeprazole (PRILOSEC) 20 MG capsule   Oral   Take 20 mg by mouth every evening.          . simvastatin (ZOCOR) 10 MG tablet   Oral   Take 10 mg by mouth daily.         . solifenacin (VESICARE) 5 MG tablet   Oral   Take 5 mg by mouth daily.         . Tamsulosin HCl (FLOMAX) 0.4 MG CAPS   Oral   Take 0.4 mg by mouth daily.           BP 121/65  Pulse 40  Temp(Src) 97.7 F (36.5 C) (Oral)  Resp 19  Ht 6\' 2"  (1.88 m)  Wt 210 lb (95.255 kg)  BMI 26.95 kg/m2  SpO2 96% Physical Exam  Constitutional: He is oriented to person, place, and time. He appears well-developed and well-nourished. No distress.  HENT:  Head: Normocephalic.  He is appear normal. 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  Eyes: Conjunctivae are normal. Pupils are equal, round, and reactive to light. No scleral icterus.  Neck: Normal range of motion. Neck supple. No thyromegaly present.  Cardiovascular: Regular rhythm.  Bradycardia present.  Exam reveals no gallop and no friction rub.   No murmur heard. Sinus bradycardia on the monitor .  Heart rate varies from 34-48.  Pulmonary/Chest: Effort normal and breath sounds normal. No respiratory distress. He has no wheezes. He has no rales.  Abdominal: Soft. Bowel sounds are normal. He exhibits no distension. There is no tenderness. There is no rebound.  Musculoskeletal: Normal range of motion.  Neurological: He is alert and oriented to person, place, and time. He has normal strength. No cranial nerve deficit or sensory deficit. GCS eye subscore is 4. GCS verbal subscore is 5. GCS motor subscore is 6.  Cranial nerves are intact. Peripheral neurological exam is intact. No carotid bruits. No pain to palpation in the neck anteriorly or posteriorly. Supple without meningismus.  No nystagmus.  No vertigo reproduced with head movements.   Skin: Skin is warm and dry. No rash noted.  Psychiatric: He has a normal mood and affect. His behavior is normal.    ED Course  Procedures (including critical care time) Labs Review Labs Reviewed  BASIC METABOLIC PANEL - Abnormal; Notable for the following:    Sodium 132 (*)    Glucose, Bld 112 (*)    GFR calc non Af Amer 60 (*)    GFR calc Af Amer 70 (*)    All other components within normal limits  CBC WITH DIFFERENTIAL  TROPONIN I   Imaging Review Ct Angio Head W/cm &/or Wo Cm  12/11/2013   CLINICAL DATA:  Headache, neck pain and dizziness.  EXAM: CT ANGIOGRAPHY HEAD  TECHNIQUE: Multidetector CTA imaging of the head and neck was performed using the standard protocol during bolus administration of intravenous contrast. Multiplanar CT image reconstructions including MIPs were obtained to evaluate the vascular anatomy.  CONTRAST:  53mL OMNIPAQUE IOHEXOL 350 MG/ML SOLN  COMPARISON:  None available for comparison at time of study interpretation.  FINDINGS: CTA neck: Mild calcific atherosclerosis of 3 vessel aortic arch. P origin of the bilateral common carotid arteries are widely patent. The bilateral common carotid arteries are widely patent, with very minimal eccentric intimal thickening of the left distal common carotid artery. 3-4 mm. Right internal carotid artery origin intimal thickening and calcific atherosclerosis. Diameter of the proximal internal carotid artery is 2.6, the distal internal carotid artery cervical segment is 3.6 mm. On sagittal image 57 of 160 is a 9 x 4.2 mm wide necked focal outpouching with apparent intimal flap at the distal margin. However, there are marginal calcifications of the outpouching. Eccentric plaque of the left common carotid artery origin without hemodynamically significant stenosis. Left cervical internal carotid artery tonsillar loop.  Codominant vertebral arteries with widely patent origins, mildly tortuous on the left. Bilateral vertebral arteries are  widely patent, normal in course, caliber and enhancement.  No hemodynamically significant stenosis, large vessel occlusion within the anterior nor posterior circulation.  Aerodigestive tract is widely patent. Patient is edentulous. 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  Review of the MIP images confirms the above findings.  CTA of the head:  Anterior circulation: Normal appearance of the petrous, cavernous and supra clinoid internal carotid arteries, the left is slightly more robust. Diminutive right A1, and compensatory rib last left A1 segments. Normal appearance the anterior middle cerebral arteries. However, within the right sylvian fissure is mildly dense 15 x 16 mm mass, with a prominent M2/3 branch along the anterior margin with ectasia, axial 141-142 of 183. Subcentimeter calcification along the anterior margin.  Posterior circulation: Normal appearance of the vertebral arteries, vertebrobasilar junction and basilar artery though, the basilar artery is somewhat diminutive. There rib os bilateral posterior communicating arteries comprising No dominant posterior cerebral artery contribution. Normal appearance of the posterior cerebral arteries.  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 No thrombus or stenosis.  Calcific atherosclerosis of the right greater than left carotid bifurcations without hemodynamically significant stenosis.  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  For the above findings on the CTA head and CTA neck recommend catheter angiogram and consider MRA and MRI of the brain with and without  contrast for further characterization of the mass.  Critical Value/emergent results were called by telephone at the time of interpretation on 12/11/2013 at 2:50PM to Dr. Tanna Furry , who verbally acknowledged these results.   Electronically Signed   By: Elon Alas   On: 12/11/2013 15:07   Dg Chest 2 View  12/11/2013   CLINICAL DATA:  Unexplained dizziness, history of hypertension and a long-time smoking history  EXAM: CHEST  2 VIEW  COMPARISON:  PA and lateral chest x-ray of January 30, 2012.  FINDINGS: The lungs are mildly hyperinflated consistent with COPD. There is no focal infiltrate. The interstitial markings are mildly prominent diffusely and appears stable the cardiopericardial silhouette is normal in size. The pulmonary vascularity is not engorged. There is mild stable tortuosity of the descending thoracic aorta. There is no pleural effusion or pneumothorax. The observed portions of the bony thorax appear normal.  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 There is no evidence of pneumonia, CHF, or other acute cardiopulmonary abnormality.   Electronically Signed   By: David  Martinique   On: 12/11/2013 12:47   Ct Angio Neck W/cm &/or Wo/cm  12/11/2013   CLINICAL DATA:  Headache, neck pain and dizziness.  EXAM: CT ANGIOGRAPHY HEAD  TECHNIQUE: Multidetector CTA imaging of the head and neck was performed using the standard protocol during bolus administration of intravenous contrast. Multiplanar CT image reconstructions including MIPs were obtained to evaluate the vascular anatomy.  CONTRAST:  71mL OMNIPAQUE IOHEXOL 350 MG/ML SOLN  COMPARISON:  None available for comparison at time of study interpretation.  FINDINGS: CTA neck: Mild calcific atherosclerosis of 3 vessel aortic arch. P origin of the bilateral common carotid arteries are widely patent. The bilateral common carotid arteries are widely patent, with very minimal eccentric intimal thickening of the left distal common carotid artery. 3-4  mm. Right internal carotid artery origin intimal thickening and calcific atherosclerosis. Diameter of the proximal internal carotid artery is 2.6, the distal internal carotid artery cervical segment is 3.6 mm. On sagittal image 57 of 160 is a 9 x 4.2 mm wide necked focal outpouching with apparent intimal flap at the distal margin. However, there are marginal calcifications of the outpouching. Eccentric plaque of the left common carotid artery origin without hemodynamically significant stenosis. Left cervical internal carotid artery tonsillar loop.  Codominant vertebral arteries with widely patent origins, mildly tortuous on the left. Bilateral vertebral arteries are widely patent, normal in course, caliber and enhancement.  No hemodynamically significant stenosis, large vessel occlusion within the anterior nor posterior circulation.  Aerodigestive tract is widely patent. Patient is edentulous. 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  Review of the MIP images confirms the above findings.  CTA of the head:  Anterior circulation: Normal appearance of the petrous, cavernous and supra clinoid internal carotid arteries, the left is slightly more robust. Diminutive right A1, and compensatory rib last left A1 segments. Normal appearance the anterior middle cerebral arteries. However, within the right sylvian fissure is mildly dense 15 x 16 mm mass, with a prominent M2/3 branch along the anterior margin with ectasia, axial 141-142 of 183. Subcentimeter calcification along the anterior margin.  Posterior circulation: Normal appearance of the vertebral arteries, vertebrobasilar junction and basilar artery though, the basilar artery is somewhat diminutive. There rib os bilateral posterior communicating arteries comprising No dominant posterior cerebral artery contribution. Normal appearance of the posterior cerebral arteries.  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 No thrombus or stenosis.  Calcific atherosclerosis of the right greater than left carotid bifurcations without hemodynamically significant stenosis.  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  For the above  findings on the CTA head and CTA neck recommend catheter angiogram and consider MRA and MRI of the brain with and without contrast for further characterization of the mass.  Critical Value/emergent results were called by telephone at the time of interpretation on 12/11/2013 at 2:50PM to Dr. Tanna Furry , who verbally acknowledged these results.   Electronically Signed   By: Elon Alas   On: 12/11/2013 15:07    EKG Interpretation    Date/Time:  Thursday December 11 2013 11:50:45 EST Ventricular Rate:  42 PR Interval:  194 QRS Duration: 98 QT Interval:  474 QTC Calculation: 395 R Axis:   51 Text Interpretation:  Marked sinus bradycardia with Premature supraventricular complexes Abnormal ECG When compared with ECG of 28-Oct-2013 15:59, Premature supraventricular complexes are now Present Confirmed by Jeneen Rinks  MD, [place] ([id number]) on 12/11/2013 12:49:15 PM            MDM   1. Internal carotid artery dissection    I received a call from radiologist describing abnormal cervical vascular anatomy, and abnormal circle of Willis anatomy on his angiogram. Left internal carotid artery is normal. 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 His posterior circulation seems to be entirely from posterior communicating artery from his right internal carotid. His right internal carotid is dissected, but nonthrombosed, just  before the entrance into the brain from the neck. He has a large right middle cerebral artery aneurysm. No sign of acute hemorrhage. No sign of acute infarct. In fact his noncontrast head CT is normal.  Placed a call to neurology to [facility]. Dr. Nicole Kindred returned my call. He is quite helpful in forming a plan. Plan was to  be for interventional radiology for consideration for angiogram or coil embolization. However there are no beds available at [facility]. today in the ICU, step down, in fact recovery is full.  I placed a call to neurology on call at [facility].  Call was returned to me by Dr. Doy Mince of neurology. He accepts the patient in transfer. I discussed the case briefly with ER physician Dr. Cleda Mccreedy. Patient accepted in  transfer.    Tanna Furry, MD 12/11/13 [place], MD 12/11/13 1613  Tanna Furry, MD 12/11/13 [place], MD 12/11/13 [phone]

## 2013-12-11 NOTE — ED Notes (Signed)
Dizziness since last night, today dizziness is worse and headache.  No vomiting."i feel unstable"Increased  Dizziness with movement of head and pain in rt post head.  No injuryTook motrin without relief

## 2013-12-11 NOTE — ED Notes (Signed)
[facility] notified to transfer pt to [facility] ED - will send a truck when available.

## 2013-12-12 DIAGNOSIS — I7771 Dissection of carotid artery: Secondary | ICD-10-CM | POA: Insufficient documentation

## 2013-12-12 DIAGNOSIS — I729 Aneurysm of unspecified site: Secondary | ICD-10-CM | POA: Insufficient documentation

## 2013-12-13 DIAGNOSIS — I639 Cerebral infarction, unspecified: Secondary | ICD-10-CM | POA: Insufficient documentation

## 2014-07-13 ENCOUNTER — Telehealth: Payer: Self-pay | Admitting: *Deleted

## 2014-07-13 ENCOUNTER — Telehealth: Payer: Self-pay | Admitting: Internal Medicine

## 2014-07-13 NOTE — Telephone Encounter (Signed)
Pt called to follow up on a referral from his PCP. He is having occassional blood in his stool and his PCP recommended him to have a colonoscopy. Pt is aware of OV on 9/1 at 0930 and he feels more comfortable seeing RMR than the extenders.

## 2014-07-13 NOTE — Telephone Encounter (Signed)
I have not received a referral. Chelsey is requesting Dr. Nevada Crane to resend. I called pt and LMOM for a return call. He is on recall for 09/2016. Dr. Gala Romney did his last one 10/03/2006 and his next was recommended in 10 years. He is not due for a couple of years unless he is having some problems or has family hx of colon cancer.

## 2014-07-13 NOTE — Telephone Encounter (Signed)
Pt said he has spoke with Dr. Durene Cal office twice and per pt Dr. Nevada Crane has sent a referral twice for him to have a colonoscopy. Please advise [phone]

## 2014-07-14 NOTE — Telephone Encounter (Signed)
Noted  

## 2014-07-15 NOTE — Telephone Encounter (Signed)
Spoke with Sybil from Dr. Juel Burrow office, per Golda Acre she will fax pt's referral again.

## 2014-07-15 NOTE — Telephone Encounter (Signed)
Pt has a appt with Dr. Gala Romney 08/04/14

## 2014-08-04 ENCOUNTER — Ambulatory Visit (INDEPENDENT_AMBULATORY_CARE_PROVIDER_SITE_OTHER): Payer: Medicare Other | Admitting: Internal Medicine

## 2014-08-04 ENCOUNTER — Encounter (INDEPENDENT_AMBULATORY_CARE_PROVIDER_SITE_OTHER): Payer: Self-pay

## 2014-08-04 ENCOUNTER — Encounter: Payer: Self-pay | Admitting: Internal Medicine

## 2014-08-04 VITALS — BP 149/81 | HR 57 | Temp 98.4°F | Ht 75.0 in | Wt 209.4 lb

## 2014-08-04 DIAGNOSIS — K921 Melena: Secondary | ICD-10-CM | POA: Insufficient documentation

## 2014-08-04 MED ORDER — PEG 3350-KCL-NA BICARB-NACL 420 G PO SOLR: 4000.0000 mL | ORAL | Status: DC

## 2014-08-04 NOTE — Progress Notes (Deleted)
Primary Care Physician:  Delphina Cahill, MD Primary Gastroenterologist:  Dr.   Pre-Procedure History & Physical: HPI:  Jonathan Greene is a 73 y.o. male here for   Past Medical History  Diagnosis Date  . Hypercholesterolemia   . Hypertension   . Bradycardia   . Frequent urination   . Urinary urgency   . GERD (gastroesophageal reflux disease)   . Prostate cancer     RADIATION TX AND SCHEDULED FOR SEED IMPLANTS 02-28-12    Past Surgical History  Procedure Laterality Date  . Radioactive seed implant  02/28/2012    Procedure: RADIOACTIVE SEED IMPLANT;  Surgeon: Bernestine Amass, MD;  Location: [facility];  Service: Urology;  Laterality: N/A;  67seeds implanted   . Cystoscopy  02/28/2012    Procedure: CYSTOSCOPY;  Surgeon: Bernestine Amass, MD;  Location: [facility];  Service: Urology;  Laterality: N/A;   no seeds noted in bladder  . Cataract extraction w/phaco Right 11/06/2013    Procedure: CATARACT EXTRACTION PHACO AND INTRAOCULAR LENS PLACEMENT (IOC);  Surgeon: Tonny Branch, MD;  Location: [facility];  Service: Ophthalmology;  Laterality: Right;  CDE 11.67  . Colonoscopy  10/03/2006    OEV:[id number] rectal polyps cold biopsied/removed, otherwise normal rectum/Sigmoid diverticula.  Remainder of colon mucosa appeared normal    Prior to Admission medications   Medication Sig Start Date End Date Taking? Authorizing Provider  aspirin 81 MG tablet Take 81 mg by mouth daily.    Yes Historical Provider, MD  docusate sodium (COLACE) 100 MG capsule Take 100 mg by mouth daily.   Yes Historical Provider, MD  fish oil-omega-3 fatty acids 1000 MG capsule Take 1 g by mouth daily.    Yes Historical Provider, MD  gabapentin (NEURONTIN) 100 MG capsule Take 100 mg by mouth 2 (two) times daily.  06/22/14  Yes Historical Provider, MD  magnesium oxide (MAG-OX) 400 MG tablet Take 400 mg by mouth daily.   Yes Historical Provider, MD  omeprazole (PRILOSEC) 20 MG capsule Take 20 mg by  mouth every evening.    Yes Historical Provider, MD  simvastatin (ZOCOR) 10 MG tablet Take 10 mg by mouth daily.   Yes Historical Provider, MD  solifenacin (VESICARE) 5 MG tablet Take 5 mg by mouth daily.   Yes Historical Provider, MD  Tamsulosin HCl (FLOMAX) 0.4 MG CAPS Take 0.4 mg by mouth daily.    Yes Historical Provider, MD  Cyanocobalamin (VITAMIN B-12) 1000 MCG/15ML LIQD Take 15 mLs by mouth daily.    Historical Provider, MD  lisinopril (PRINIVIL,ZESTRIL) 20 MG tablet Take 20 mg by mouth daily.     Historical Provider, MD    Allergies as of 08/04/2014  . (No Known Allergies)    Family History  Problem Relation Age of Onset  . Stroke Mother   . Cancer Neg Hx     History   Social History  . Marital Status: Married    Spouse Name: N/A    Number of Children: N/A  . Years of Education: N/A   Occupational History  . Not on file.   Social History Main Topics  . Smoking status: Former Smoker -- 2.00 packs/day for 35 years    Types: Cigarettes    Quit date: 02/21/1991  . Smokeless tobacco: Current User    Types: Chew     Comment: CHEW TOBACCO FOR 20 YRS   . Alcohol Use: 8.4 oz/week    14 Shots of liquor per week  Comment: daily  . Drug Use: No  . Sexual Activity: Yes    Birth Control/ Protection: None   Other Topics Concern  . Not on file   Social History Narrative  . No narrative on file    Review of Systems: See HPI, otherwise negative ROS  Physical Exam: BP 149/81  Pulse 57  Temp(Src) 98.4 F (36.9 C) (Oral)  Ht 6\' 3"  (1.905 m)  Wt 209 lb 6.4 oz (94.983 kg)  BMI 26.17 kg/m2 General:   Alert,  Well-developed, well-nourished, pleasant and cooperative in NAD Skin:  Intact without significant lesions or rashes. Eyes:  Sclera clear, no icterus.   Conjunctiva pink. Ears:  Normal auditory acuity. Nose:  No deformity, discharge,  or lesions. Mouth:  No deformity or lesions. Neck:  Supple; no masses or thyromegaly. No significant cervical  adenopathy. Lungs:  Clear throughout to auscultation.   No wheezes, crackles, or rhonchi. No acute distress. Heart:  Regular rate and rhythm; no murmurs, clicks, rubs,  or gallops. Abdomen: Non-distended, normal bowel sounds.  Soft and nontender without appreciable mass or hepatosplenomegaly.  Pulses:  Normal pulses noted. Extremities:  Without clubbing or edema.  Impression/Plan:  ***     Notice: This dictation was prepared with Dragon dictation along with smaller phrase technology. Any transcriptional errors that result from this process are unintentional and may not be corrected upon review.

## 2014-08-04 NOTE — Patient Instructions (Signed)
Schedule diagnostic colonoscopy - hematochezia  Split Movie prep  Further recommendations to follow

## 2014-08-04 NOTE — Progress Notes (Signed)
Primary Care Physician:  Delphina Cahill, MD Primary Gastroenterologist:  Dr. Gala Romney  Pre-Procedure History & Physical: HPI:  Jonathan Greene is a 73 y.o. male here for evaluation of  painless hematochezia in the setting of constipation which lasts about a month recently. Patient started a stool softener and this alleviated his bleeding. History of radiation seed implants for prostate cancer. Last colonoscopy 2007-hyperplastic rectal polyp removed and diverticulosis. He was Hemoccult positive recently. Has not had any associated abdominal pain or melena. Moving his bowels daily with a stool softener. Reflux symptoms well controlled on Prilosec 20 mg daily; no early satiety, nausea, vomiting or dysphagia. 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 Resolved with excellent preservation of neurologic function. No anticoagulants.  Past Medical History  Diagnosis Date  . Hypercholesterolemia   . Hypertension   . Bradycardia   . Frequent urination   . Urinary urgency   . GERD (gastroesophageal reflux disease)   . Prostate cancer     RADIATION TX AND SCHEDULED FOR SEED IMPLANTS 02-28-12    Past Surgical History  Procedure Laterality Date  . Radioactive seed implant  02/28/2012    Procedure: RADIOACTIVE SEED IMPLANT;  Surgeon: Bernestine Amass, MD;  Location: [facility];  Service: Urology;  Laterality: N/A;  67seeds implanted   . Cystoscopy  02/28/2012    Procedure: CYSTOSCOPY;  Surgeon: Bernestine Amass, MD;  Location: [facility];  Service: Urology;  Laterality: N/A;   no seeds noted in bladder  . Cataract extraction w/phaco Right 11/06/2013    Procedure: CATARACT EXTRACTION PHACO AND INTRAOCULAR LENS PLACEMENT (IOC);  Surgeon: Tonny Branch, MD;  Location: [facility];  Service: Ophthalmology;  Laterality: Right;  CDE 11.67  . Colonoscopy  10/03/2006    OVF:[id number] rectal polyps cold biopsied/removed, otherwise normal rectum/Sigmoid diverticula.  Remainder of colon mucosa  appeared normal    Prior to Admission medications   Medication Sig Start Date End Date Taking? Authorizing Provider  aspirin 81 MG tablet Take 81 mg by mouth daily.    Yes Historical Provider, MD  docusate sodium (COLACE) 100 MG capsule Take 100 mg by mouth daily.   Yes Historical Provider, MD  fish oil-omega-3 fatty acids 1000 MG capsule Take 1 g by mouth daily.    Yes Historical Provider, MD  gabapentin (NEURONTIN) 100 MG capsule Take 100 mg by mouth 2 (two) times daily.  06/22/14  Yes Historical Provider, MD  magnesium oxide (MAG-OX) 400 MG tablet Take 400 mg by mouth daily.   Yes Historical Provider, MD  omeprazole (PRILOSEC) 20 MG capsule Take 20 mg by mouth every evening.    Yes Historical Provider, MD  simvastatin (ZOCOR) 10 MG tablet Take 10 mg by mouth daily.   Yes Historical Provider, MD  solifenacin (VESICARE) 5 MG tablet Take 5 mg by mouth daily.   Yes Historical Provider, MD  Tamsulosin HCl (FLOMAX) 0.4 MG CAPS Take 0.4 mg by mouth daily.    Yes Historical Provider, MD  Cyanocobalamin (VITAMIN B-12) 1000 MCG/15ML LIQD Take 15 mLs by mouth daily.    Historical Provider, MD  lisinopril (PRINIVIL,ZESTRIL) 20 MG tablet Take 20 mg by mouth daily.     Historical Provider, MD    Allergies as of 08/04/2014  . (No Known Allergies)    Family History  Problem Relation Age of Onset  . Stroke Mother   . Cancer Neg Hx     History   Social History  . Marital Status: Married  Spouse Name: N/A    Number of Children: N/A  . Years of Education: N/A   Occupational History  . Not on file.   Social History Main Topics  . Smoking status: Former Smoker -- 2.00 packs/day for 35 years    Types: Cigarettes    Quit date: 02/21/1991  . Smokeless tobacco: Current User    Types: Chew     Comment: CHEW TOBACCO FOR 20 YRS   . Alcohol Use: 8.4 oz/week    14 Shots of liquor per week     Comment: daily  . Drug Use: No  . Sexual Activity: Yes    Birth Control/ Protection: None   Other  Topics Concern  . Not on file   Social History Narrative  . No narrative on file    Review of Systems: See HPI, otherwise negative ROS  Physical Exam: BP 149/81  Pulse 57  Temp(Src) 98.4 F (36.9 C) (Oral)  Ht 6\' 3"  (1.905 m)  Wt 209 lb 6.4 oz (94.983 kg)  BMI 26.17 kg/m2 General:   Alert,  Well-developed, well-nourished, pleasant and cooperative in NAD Skin:  Intact without significant lesions or rashes. Eyes:  Sclera clear, no icterus.   Conjunctiva pink. Ears:  Normal auditory acuity. Nose:  No deformity, discharge,  or lesions. Mouth:  No deformity or lesions. Neck:  Supple; no masses or thyromegaly. No significant cervical adenopathy. Lungs:  Clear throughout to auscultation.   No wheezes, crackles, or rhonchi. No acute distress. Heart:  Regular rate and rhythm; no murmurs, clicks, rubs,  or gallops. Abdomen: Non-distended, normal bowel sounds.  Soft and nontender without appreciable mass or hepatosplenomegaly.  Pulses:  Normal pulses noted. Extremities:  Without clubbing or edema. Rectal:  Deferred until time of colonoscopy   Impression:   Pleasant 73 year old gentleman with hematochezia x1 month at least temporally related to constipation. History of radiation seed implants. No significant pathology found on colonoscopy nearly 10 years ago. Rectal bleeding has resolved with management of constipation per patient.  Hopefully, bleeding is originating from a benign anorectal source. 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 However, we need to rule out other pathology in his lower GI tract contributing to his symptoms  To this end, I have offered him a diagnostic colonoscopy at this time.  The risks, benefits, limitations, alternatives and imponderables have been reviewed with the patient. Questions have been answered. All parties are agreeable.      Notice: This dictation was prepared with Dragon dictation along with smaller phrase  technology. Any transcriptional errors that result from this process are unintentional and may not be corrected upon review.

## 2014-08-05 ENCOUNTER — Encounter (HOSPITAL_COMMUNITY): Payer: Self-pay | Admitting: Pharmacy Technician

## 2014-08-19 ENCOUNTER — Encounter (HOSPITAL_COMMUNITY): Payer: Self-pay | Admitting: *Deleted

## 2014-08-19 ENCOUNTER — Encounter (HOSPITAL_COMMUNITY)
Admission: RE | Disposition: A | Discharge status: Home / Self Care | Payer: Self-pay | Source: Ambulatory Visit | Attending: Internal Medicine

## 2014-08-19 ENCOUNTER — Ambulatory Visit (HOSPITAL_COMMUNITY)
Admission: RE | Admit: 2014-08-19 | Discharge: 2014-08-19 | Disposition: A | Discharge status: Home / Self Care | Payer: Medicare Other | Source: Ambulatory Visit | Attending: Internal Medicine | Admitting: Internal Medicine

## 2014-08-19 DIAGNOSIS — E7800 Pure hypercholesterolemia, unspecified: Secondary | ICD-10-CM | POA: Diagnosis not present

## 2014-08-19 DIAGNOSIS — K6289 Other specified diseases of anus and rectum: Secondary | ICD-10-CM | POA: Diagnosis not present

## 2014-08-19 DIAGNOSIS — Z87891 Personal history of nicotine dependence: Secondary | ICD-10-CM | POA: Diagnosis not present

## 2014-08-19 DIAGNOSIS — K5730 Diverticulosis of large intestine without perforation or abscess without bleeding: Secondary | ICD-10-CM | POA: Diagnosis not present

## 2014-08-19 DIAGNOSIS — K921 Melena: Secondary | ICD-10-CM | POA: Insufficient documentation

## 2014-08-19 DIAGNOSIS — K219 Gastro-esophageal reflux disease without esophagitis: Secondary | ICD-10-CM | POA: Diagnosis not present

## 2014-08-19 DIAGNOSIS — Z79899 Other long term (current) drug therapy: Secondary | ICD-10-CM | POA: Insufficient documentation

## 2014-08-19 DIAGNOSIS — K627 Radiation proctitis: Secondary | ICD-10-CM

## 2014-08-19 DIAGNOSIS — I10 Essential (primary) hypertension: Secondary | ICD-10-CM | POA: Insufficient documentation

## 2014-08-19 DIAGNOSIS — C61 Malignant neoplasm of prostate: Secondary | ICD-10-CM | POA: Insufficient documentation

## 2014-08-19 HISTORY — PX: COLONOSCOPY: SHX5424

## 2014-08-19 SURGERY — COLONOSCOPY
Anesthesia: Moderate Sedation

## 2014-08-19 MED ORDER — MEPERIDINE HCL 100 MG/ML IJ SOLN
INTRAMUSCULAR | Status: DC
Start: 2014-08-19 — End: 2014-08-19
  Filled 2014-08-19: qty 2

## 2014-08-19 MED ORDER — MIDAZOLAM HCL 5 MG/5ML IJ SOLN
INTRAMUSCULAR | Status: AC
  Filled 2014-08-19: qty 10

## 2014-08-19 MED ORDER — MIDAZOLAM HCL 5 MG/5ML IJ SOLN
INTRAMUSCULAR | Status: DC | PRN
  Administered 2014-08-19 (×2): 1 mg via INTRAVENOUS
  Administered 2014-08-19: 2 mg via INTRAVENOUS

## 2014-08-19 MED ORDER — ONDANSETRON HCL 4 MG/2ML IJ SOLN
INTRAMUSCULAR | Status: AC
  Filled 2014-08-19: qty 2

## 2014-08-19 MED ORDER — ONDANSETRON HCL 4 MG/2ML IJ SOLN
INTRAMUSCULAR | Status: DC | PRN
  Administered 2014-08-19: 4 mg via INTRAVENOUS

## 2014-08-19 MED ORDER — MEPERIDINE HCL 100 MG/ML IJ SOLN
INTRAMUSCULAR | Status: DC | PRN
  Administered 2014-08-19 (×2): 25 mg via INTRAVENOUS

## 2014-08-19 MED ORDER — STERILE WATER FOR IRRIGATION IR SOLN
Status: DC | PRN
  Administered 2014-08-19: 10:00:00

## 2014-08-19 MED ORDER — SODIUM CHLORIDE 0.9 % IV SOLN
INTRAVENOUS | Status: DC
Start: 2014-08-19 — End: 2014-08-19

## 2014-08-19 NOTE — H&P (View-Only) (Signed)
Primary Care Physician:  Delphina Cahill, MD Primary Gastroenterologist:  Dr. Gala Romney  Pre-Procedure History & Physical: HPI:  Jonathan Greene is a 73 y.o. male here for evaluation of  painless hematochezia in the setting of constipation which lasts about a month recently. Patient started a stool softener and this alleviated his bleeding. History of radiation seed implants for prostate cancer. Last colonoscopy 2007-hyperplastic rectal polyp removed and diverticulosis. He was Hemoccult positive recently. Has not had any associated abdominal pain or melena. Moving his bowels daily with a stool softener. Reflux symptoms well controlled on Prilosec 20 mg daily; no early satiety, nausea, vomiting or dysphagia. 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 Resolved with excellent preservation of neurologic function. No anticoagulants.  Past Medical History  Diagnosis Date  . Hypercholesterolemia   . Hypertension   . Bradycardia   . Frequent urination   . Urinary urgency   . GERD (gastroesophageal reflux disease)   . Prostate cancer     RADIATION TX AND SCHEDULED FOR SEED IMPLANTS 02-28-12    Past Surgical History  Procedure Laterality Date  . Radioactive seed implant  02/28/2012    Procedure: RADIOACTIVE SEED IMPLANT;  Surgeon: Bernestine Amass, MD;  Location: [facility];  Service: Urology;  Laterality: N/A;  67seeds implanted   . Cystoscopy  02/28/2012    Procedure: CYSTOSCOPY;  Surgeon: Bernestine Amass, MD;  Location: [facility];  Service: Urology;  Laterality: N/A;   no seeds noted in bladder  . Cataract extraction w/phaco Right 11/06/2013    Procedure: CATARACT EXTRACTION PHACO AND INTRAOCULAR LENS PLACEMENT (IOC);  Surgeon: Tonny Branch, MD;  Location: [facility];  Service: Ophthalmology;  Laterality: Right;  CDE 11.67  . Colonoscopy  10/03/2006    [facility]:[id number] rectal polyps cold biopsied/removed, otherwise normal rectum/Sigmoid diverticula.  Remainder of colon mucosa  appeared normal    Prior to Admission medications   Medication Sig Start Date End Date Taking? Authorizing Provider  aspirin 81 MG tablet Take 81 mg by mouth daily.    Yes Historical Provider, MD  docusate sodium (COLACE) 100 MG capsule Take 100 mg by mouth daily.   Yes Historical Provider, MD  fish oil-omega-3 fatty acids 1000 MG capsule Take 1 g by mouth daily.    Yes Historical Provider, MD  gabapentin (NEURONTIN) 100 MG capsule Take 100 mg by mouth 2 (two) times daily.  06/22/14  Yes Historical Provider, MD  magnesium oxide (MAG-OX) 400 MG tablet Take 400 mg by mouth daily.   Yes Historical Provider, MD  omeprazole (PRILOSEC) 20 MG capsule Take 20 mg by mouth every evening.    Yes Historical Provider, MD  simvastatin (ZOCOR) 10 MG tablet Take 10 mg by mouth daily.   Yes Historical Provider, MD  solifenacin (VESICARE) 5 MG tablet Take 5 mg by mouth daily.   Yes Historical Provider, MD  Tamsulosin HCl (FLOMAX) 0.4 MG CAPS Take 0.4 mg by mouth daily.    Yes Historical Provider, MD  Cyanocobalamin (VITAMIN B-12) 1000 MCG/15ML LIQD Take 15 mLs by mouth daily.    Historical Provider, MD  lisinopril (PRINIVIL,ZESTRIL) 20 MG tablet Take 20 mg by mouth daily.     Historical Provider, MD    Allergies as of 08/04/2014  . (No Known Allergies)    Family History  Problem Relation Age of Onset  . Stroke Mother   . Cancer Neg Hx     History   Social History  . Marital Status: Married  Spouse Name: N/A    Number of Children: N/A  . Years of Education: N/A   Occupational History  . Not on file.   Social History Main Topics  . Smoking status: Former Smoker -- 2.00 packs/day for 35 years    Types: Cigarettes    Quit date: 02/21/1991  . Smokeless tobacco: Current User    Types: Chew     Comment: CHEW TOBACCO FOR 20 YRS   . Alcohol Use: 8.4 oz/week    14 Shots of liquor per week     Comment: daily  . Drug Use: No  . Sexual Activity: Yes    Birth Control/ Protection: None   Other  Topics Concern  . Not on file   Social History Narrative  . No narrative on file    Review of Systems: See HPI, otherwise negative ROS  Physical Exam: BP 149/81  Pulse 57  Temp(Src) 98.4 F (36.9 C) (Oral)  Ht 6\' 3"  (1.905 m)  Wt 209 lb 6.4 oz (94.983 kg)  BMI 26.17 kg/m2 General:   Alert,  Well-developed, well-nourished, pleasant and cooperative in NAD Skin:  Intact without significant lesions or rashes. Eyes:  Sclera clear, no icterus.   Conjunctiva pink. Ears:  Normal auditory acuity. Nose:  No deformity, discharge,  or lesions. Mouth:  No deformity or lesions. Neck:  Supple; no masses or thyromegaly. No significant cervical adenopathy. Lungs:  Clear throughout to auscultation.   No wheezes, crackles, or rhonchi. No acute distress. Heart:  Regular rate and rhythm; no murmurs, clicks, rubs,  or gallops. Abdomen: Non-distended, normal bowel sounds.  Soft and nontender without appreciable mass or hepatosplenomegaly.  Pulses:  Normal pulses noted. Extremities:  Without clubbing or edema. Rectal:  Deferred until time of colonoscopy   Impression:   Pleasant 73 year old gentleman with hematochezia x1 month at least temporally related to constipation. History of radiation seed implants. No significant pathology found on colonoscopy nearly 10 years ago. Rectal bleeding has resolved with management of constipation per patient.  Hopefully, bleeding is originating from a benign anorectal source. 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 However, we need to rule out other pathology in his lower GI tract contributing to his symptoms  To this end, I have offered him a diagnostic colonoscopy at this time.  The risks, benefits, limitations, alternatives and imponderables have been reviewed with the patient. Questions have been answered. All parties are agreeable.      Notice: This dictation was prepared with Dragon dictation along with smaller phrase  technology. Any transcriptional errors that result from this process are unintentional and may not be corrected upon review.

## 2014-08-19 NOTE — Interval H&P Note (Signed)
History and Physical Interval Note:  08/19/2014 9:43 AM  Jonathan Greene  has presented today for surgery, with the diagnosis of HEMATOCHEZIA  The various methods of treatment have been discussed with the patient and family. After consideration of risks, benefits and other options for treatment, the patient has consented to  Procedure(s) with comments: COLONOSCOPY (N/A) - 2:00 - moved to 9:30 - Darius Bump notified pt as a surgical intervention .  The patient's history has been reviewed, patient examined, no change in status, stable for surgery.  I have reviewed the patient's chart and labs.  Questions were answered to the patient's satisfaction.     Robert Rourk  Continued intermittent rectal bleeding. Colonoscopy per plan. Chronic bradycardia.The risks, benefits, limitations, alternatives and imponderables have been reviewed with the patient. Questions have been answered. All parties are agreeable.

## 2014-08-19 NOTE — Discharge Instructions (Signed)
Colonoscopy Discharge Instructions  Read the instructions outlined below and refer to this sheet in the next few weeks. These discharge instructions provide you with general information on caring for yourself after you leave the hospital. Your doctor may also give you specific instructions. While your treatment has been planned according to the most current medical practices available, unavoidable complications occasionally occur. If you have any problems or questions after discharge, call Dr. Gala Romney at [phone]. ACTIVITY  You may resume your regular activity, but move at a slower pace for the next 24 hours.   Take frequent rest periods for the next 24 hours.   Walking will help get rid of the air and reduce the bloated feeling in your belly (abdomen).   No driving for 24 hours (because of the medicine (anesthesia) used during the test).    Do not sign any important legal documents or operate any machinery for 24 hours (because of the anesthesia used during the test).  NUTRITION  Drink plenty of fluids.   You may resume your normal diet as instructed by your doctor.   Begin with a light meal and progress to your normal diet. Heavy or fried foods are harder to digest and may make you feel sick to your stomach (nauseated).   Avoid alcoholic beverages for 24 hours or as instructed.  MEDICATIONS  You may resume your normal medications unless your doctor tells you otherwise.  WHAT YOU CAN EXPECT TODAY  Some feelings of bloating in the abdomen.   Passage of more gas than usual.   Spotting of blood in your stool or on the toilet paper.  IF YOU HAD POLYPS REMOVED DURING THE COLONOSCOPY:  No aspirin products for 7 days or as instructed.   No alcohol for 7 days or as instructed.   Eat a soft diet for the next 24 hours.  FINDING OUT THE RESULTS OF YOUR TEST Not all test results are available during your visit. If your test results are not back during the visit, make an appointment  with your caregiver to find out the results. Do not assume everything is normal if you have not heard from your caregiver or the medical facility. It is important for you to follow up on all of your test results.  SEEK IMMEDIATE MEDICAL ATTENTION IF:  You have more than a spotting of blood in your stool.   Your belly is swollen (abdominal distention).   You are nauseated or vomiting.   You have a temperature over 101.   You have abdominal pain or discomfort that is severe or gets worse throughout the day.    Diverticulosis information provided  Begin Benefiber 2 teaspoons twice daily  Office visit with [place] in 3 months   Diverticulosis Diverticulosis is the condition that develops when small pouches (diverticula) form in the wall of your colon. Your colon, or large intestine, is where water is absorbed and stool is formed. The pouches form when the inside layer of your colon pushes through weak spots in the outer layers of your colon. CAUSES  No one knows exactly what causes diverticulosis. RISK FACTORS  Being older than 24. Your risk for this condition increases with age. Diverticulosis is rare in people younger than 40 years. By age 59, almost everyone has it.  Eating a low-fiber diet.  Being frequently constipated.  Being overweight.  Not getting enough exercise.  Smoking.  Taking over-the-counter pain medicines, like aspirin and ibuprofen. SYMPTOMS  Most people with diverticulosis do not have  symptoms. DIAGNOSIS  Because diverticulosis often has no symptoms, health care providers often discover the condition during an exam for other colon problems. In many cases, a health care provider will diagnose diverticulosis while using a flexible scope to examine the colon (colonoscopy). TREATMENT  If you have never developed an infection related to diverticulosis, you may not need treatment. If you have had an infection before, treatment may include:  Eating more fruits,  vegetables, and grains.  Taking a fiber supplement.  Taking a live bacteria supplement (probiotic).  Taking medicine to relax your colon. HOME CARE INSTRUCTIONS   Drink at least 6-8 glasses of water each day to prevent constipation.  Try not to strain when you have a bowel movement.  Keep all follow-up appointments. If you have had an infection before:  Increase the fiber in your diet as directed by your health care provider or dietitian.  Take a dietary fiber supplement if your health care provider approves.  Only take medicines as directed by your health care provider. SEEK MEDICAL CARE IF:   You have abdominal pain.  You have bloating.  You have cramps.  You have not gone to the bathroom in 3 days. SEEK IMMEDIATE MEDICAL CARE IF:   Your pain gets worse.  Yourbloating becomes very bad.  You have a fever or chills, and your symptoms suddenly get worse.  You begin vomiting.  You have bowel movements that are bloody or black. MAKE SURE YOU:  Understand these instructions.  Will watch your condition.  Will get help right away if you are not doing well or get worse. Document Released: 08/17/2004 Document Revised: 11/25/2013 Document Reviewed: 10/15/2013 [facility] Patient Information 2015 [place], [place]. This information is not intended to replace advice given to you by your health care provider. Make sure you discuss any questions you have with your health care provider.

## 2014-08-19 NOTE — Op Note (Signed)
[facility] [address] [place], [address]   COLONOSCOPY PROCEDURE REPORT  PATIENT: Jonathan Greene, Jonathan Greene  MR#:         [id number] BIRTHDATE: 05/08/1941 , 72  yrs. old GENDER: Male ENDOSCOPIST: R.  Garfield Cornea, MD FACP FACG REFERRED BY:  Delphina Cahill, M.D. PROCEDURE DATE:  08/19/2014 PROCEDURE:     Colonoscopy with APC ablation  INDICATIONS: hematochezia; history of radiation seed implant for prostate cancer  INFORMED CONSENT:  The risks, benefits, alternatives and imponderables including but not limited to bleeding, perforation as well as the possibility of a missed lesion have been reviewed.  The potential for biopsy, lesion removal, etc. have also been discussed.  Questions have been answered.  All parties agreeable. Please see the history and physical in the medical record for more information.  MEDICATIONS: Versed 4 mg IV and Demerol 50 mg IV in divided doses. Zofran 4 mg IV.  DESCRIPTION OF PROCEDURE:  After a digital rectal exam was performed, the EC-[id number] ([id number])  colonoscope was advanced from the anus through the rectum and colon to the area of the cecum, ileocecal valve and appendiceal orifice.  The cecum was deeply intubated.  These structures were well-seen and photographed for the record.  From the level of the cecum and ileocecal valve, the scope was slowly and cautiously withdrawn.  The mucosal surfaces were carefully surveyed utilizing scope tip deflection to facilitate fold flattening as needed.  The scope was pulled down into the rectum where a thorough examination including retroflexion was performed.    FINDINGS:  Adequate preparation. Neovascular changes of the distal rectal mucosa along the anterior wall with friability present. Otherwise, the remainder of the rectal mucosa appeared normal. Patient has scattered left-sided diverticula; the remainder of the colonic mucosa appeared normal aside from redundancy and  elongation requiring a number of maneuvers including changing of the patient's position and external abdominal pressure to reach the cecum.  THERAPEUTIC / DIAGNOSTIC MANEUVERS PERFORMED:  The abnormal rectal mucosa was sealed / ablated with several applications of the APC utilizing the circular probe at 20 J/rectal setting.  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  RECOMMENDATIONS: Add Benefiber 2 teaspoons twice daily to his regimen. Office visit in 3 months.   _______________________________ eSigned:  R. Garfield Cornea, MD FACP [facility] 08/19/2014 10:33 AM   CC:    PATIENT NAME:  Jonathan Greene MR#: [id number]

## 2014-08-21 ENCOUNTER — Encounter (HOSPITAL_COMMUNITY): Payer: Self-pay | Admitting: Internal Medicine

## 2014-11-18 ENCOUNTER — Encounter: Payer: Self-pay | Admitting: Gastroenterology

## 2014-11-18 ENCOUNTER — Ambulatory Visit: Payer: Medicare Other | Admitting: Gastroenterology

## 2014-11-18 ENCOUNTER — Telehealth: Payer: Self-pay | Admitting: Internal Medicine

## 2014-11-18 NOTE — Telephone Encounter (Signed)
PATIENT WAS A NO SHOW 11/18/14  LETTER SENT °

## 2015-01-28 ENCOUNTER — Emergency Department (HOSPITAL_COMMUNITY): Payer: Medicare Other

## 2015-01-28 ENCOUNTER — Emergency Department (HOSPITAL_COMMUNITY)
Admission: EM | Admit: 2015-01-28 | Discharge: 2015-01-28 | Disposition: A | Discharge status: Home / Self Care | Payer: Medicare Other | Attending: Emergency Medicine | Admitting: Emergency Medicine

## 2015-01-28 ENCOUNTER — Encounter (HOSPITAL_COMMUNITY): Payer: Self-pay

## 2015-01-28 DIAGNOSIS — K219 Gastro-esophageal reflux disease without esophagitis: Secondary | ICD-10-CM | POA: Diagnosis not present

## 2015-01-28 DIAGNOSIS — I4891 Unspecified atrial fibrillation: Secondary | ICD-10-CM | POA: Insufficient documentation

## 2015-01-28 DIAGNOSIS — Z87891 Personal history of nicotine dependence: Secondary | ICD-10-CM | POA: Insufficient documentation

## 2015-01-28 DIAGNOSIS — I10 Essential (primary) hypertension: Secondary | ICD-10-CM | POA: Diagnosis not present

## 2015-01-28 DIAGNOSIS — R0602 Shortness of breath: Secondary | ICD-10-CM | POA: Diagnosis not present

## 2015-01-28 DIAGNOSIS — E780 Pure hypercholesterolemia: Secondary | ICD-10-CM | POA: Insufficient documentation

## 2015-01-28 DIAGNOSIS — R42 Dizziness and giddiness: Secondary | ICD-10-CM | POA: Diagnosis present

## 2015-01-28 DIAGNOSIS — Z7982 Long term (current) use of aspirin: Secondary | ICD-10-CM | POA: Insufficient documentation

## 2015-01-28 DIAGNOSIS — Z8546 Personal history of malignant neoplasm of prostate: Secondary | ICD-10-CM | POA: Insufficient documentation

## 2015-01-28 DIAGNOSIS — Z79899 Other long term (current) drug therapy: Secondary | ICD-10-CM | POA: Diagnosis not present

## 2015-01-28 LAB — CBC WITH DIFFERENTIAL/PLATELET
BASOS ABS: 0 10*3/uL (ref 0.0–0.1)
BASOS PCT: 0 % (ref 0–1)
EOS ABS: 0.3 10*3/uL (ref 0.0–0.7)
EOS PCT: 3 % (ref 0–5)
HCT: 45.6 % (ref 39.0–52.0)
Hemoglobin: 15.5 g/dL (ref 13.0–17.0)
LYMPHS PCT: 26 % (ref 12–46)
Lymphs Abs: 2 10*3/uL (ref 0.7–4.0)
MCH: 30.8 pg (ref 26.0–34.0)
MCHC: 34 g/dL (ref 30.0–36.0)
MCV: 90.7 fL (ref 78.0–100.0)
Monocytes Absolute: 0.5 10*3/uL (ref 0.1–1.0)
Monocytes Relative: 7 % (ref 3–12)
Neutro Abs: 5 10*3/uL (ref 1.7–7.7)
Neutrophils Relative %: 64 % (ref 43–77)
PLATELETS: 156 10*3/uL (ref 150–400)
RBC: 5.03 MIL/uL (ref 4.22–5.81)
RDW: 13.2 % (ref 11.5–15.5)
WBC: 7.9 10*3/uL (ref 4.0–10.5)

## 2015-01-28 LAB — BASIC METABOLIC PANEL
Anion gap: 3 — ABNORMAL LOW (ref 5–15)
BUN: 15 mg/dL (ref 6–23)
CHLORIDE: 111 mmol/L (ref 96–112)
CO2: 21 mmol/L (ref 19–32)
Calcium: 8.6 mg/dL (ref 8.4–10.5)
Creatinine, Ser: 1.29 mg/dL (ref 0.50–1.35)
GFR calc Af Amer: 62 mL/min — ABNORMAL LOW (ref >90)
GFR, EST NON AFRICAN AMERICAN: 54 mL/min — AB (ref >90)
GLUCOSE: 125 mg/dL — AB (ref 70–99)
Potassium: 3.8 mmol/L (ref 3.5–5.1)
Sodium: 135 mmol/L (ref 135–145)

## 2015-01-28 LAB — TROPONIN I: Troponin I: <0.03 ng/mL (ref <0.031)

## 2015-01-28 LAB — D-DIMER, QUANTITATIVE (NOT AT ARMC): D DIMER QUANT: 0.52 ug{FEU}/mL — AB (ref 0.00–0.48)

## 2015-01-28 MED ORDER — RIVAROXABAN 20 MG PO TABS: 20.0000 mg | ORAL_TABLET | Freq: Every day | ORAL | Status: DC

## 2015-01-28 MED ORDER — SODIUM CHLORIDE 0.9 % IV BOLUS (SEPSIS)
1000.0000 mL | Freq: Once | INTRAVENOUS | Status: AC
  Administered 2015-01-28: 1000 mL via INTRAVENOUS

## 2015-01-28 MED ORDER — IOHEXOL 350 MG/ML SOLN
100.0000 mL | Freq: Once | INTRAVENOUS | Status: AC | PRN
  Administered 2015-01-28: 100 mL via INTRAVENOUS

## 2015-01-28 NOTE — ED Notes (Signed)
Pt reports for the past month has had dizziness and SOB.  Reports today HR was fluctuating and  bp was in the 53'Z systolic.

## 2015-01-28 NOTE — ED Provider Notes (Signed)
CSN: [id number]     Arrival date & time 01/28/15  3299 History  This chart was scribed for Jonathan Hamburger, MD by Stephania Fragmin, ED Scribe. This patient was seen in room APA10/[facility]10 and the patient's care was started at 9:58 AM.    Chief Complaint  Patient presents with  . Hypotension   The history is provided by the patient. No language interpreter was used.     HPI Comments: Jonathan Greene is a 74 y.o. male with a history of bradycardia, CVA, hyperlipidemia, hypertension, and prostate cancer who presents to the Emergency Department for hypotension and irregular heart rate. He states he has had SOB and concurrent lightheaded-ness for the past month. He checked his BP this morning, with a systolic pressure in the 24-26S, and his heart rate was fluctuating between 70-120. He has SOB currently while in the room, but denies lightheadedness at this time as it is relieved by laying down. He denies a history of Afib; however, he does state his cardiologist wanted him to have a pacemaker after diagnosing him with bradycardia several years ago, which he refused. Patient is currently on a daily blood aspirin, but denies use of any other blood thinners. He reports that he had a CVA last year, and he has had no problems since until now. Patient denies chest pain. He does not currently have a cardiologist.   Past Medical History  Diagnosis Date  . Hypercholesterolemia   . Hypertension   . Bradycardia   . Frequent urination   . Urinary urgency   . GERD (gastroesophageal reflux disease)   . Prostate cancer     RADIATION TX AND SCHEDULED FOR SEED IMPLANTS 02-28-12   Past Surgical History  Procedure Laterality Date  . Radioactive seed implant  02/28/2012    Procedure: RADIOACTIVE SEED IMPLANT;  Surgeon: Bernestine Amass, MD;  Location: [facility];  Service: Urology;  Laterality: N/A;  67seeds implanted   . Cystoscopy  02/28/2012    Procedure: CYSTOSCOPY;  Surgeon: Bernestine Amass, MD;   Location: [facility];  Service: Urology;  Laterality: N/A;   no seeds noted in bladder  . Cataract extraction w/phaco Right 11/06/2013    Procedure: CATARACT EXTRACTION PHACO AND INTRAOCULAR LENS PLACEMENT (IOC);  Surgeon: Tonny Branch, MD;  Location: [facility];  Service: Ophthalmology;  Laterality: Right;  CDE 11.67  . Colonoscopy  10/03/2006    [id number] rectal polyps cold biopsied/removed, otherwise normal rectum/Sigmoid diverticula.  Remainder of colon mucosa appeared normal  . Colonoscopy N/A 08/19/2014    RMR: Radiation proctitis-status post APC ablation. Colonic diverticulosis.   Family History  Problem Relation Age of Onset  . Stroke Mother   . Cancer Neg Hx    History  Substance Use Topics  . Smoking status: Former Smoker -- 2.00 packs/day for 35 years    Types: Cigarettes    Quit date: 02/21/1991  . Smokeless tobacco: Current User    Types: Chew     Comment: CHEW TOBACCO FOR 20 YRS   . Alcohol Use: 8.4 oz/week    14 Shots of liquor per week     Comment: daily    Review of Systems  Respiratory: Positive for shortness of breath.   Cardiovascular: Negative for chest pain.  Neurological: Positive for light-headedness.  All other systems reviewed and are negative.     Allergies  Review of patient's allergies indicates no known allergies.  Home Medications   Prior to Admission medications  Medication Sig Start Date End Date Taking? Authorizing Provider  aspirin 81 MG tablet Take 81 mg by mouth daily.     Historical Provider, MD  docusate sodium (COLACE) 100 MG capsule Take 100 mg by mouth daily.    Historical Provider, MD  fish oil-omega-3 fatty acids 1000 MG capsule Take 1 g by mouth daily.     Historical Provider, MD  gabapentin (NEURONTIN) 100 MG capsule Take 100 mg by mouth 2 (two) times daily.  06/22/14   Historical Provider, MD  magnesium oxide (MAG-OX) 400 MG tablet Take 400 mg by mouth daily.    Historical Provider, MD  omeprazole (PRILOSEC) 20  MG capsule Take 20 mg by mouth every evening.     Historical Provider, MD  simvastatin (ZOCOR) 10 MG tablet Take 10 mg by mouth daily.    Historical Provider, MD  solifenacin (VESICARE) 5 MG tablet Take 5 mg by mouth daily.    Historical Provider, MD  Tamsulosin HCl (FLOMAX) 0.4 MG CAPS Take 0.4 mg by mouth daily.     Historical Provider, MD   There were no vitals taken for this visit. Physical Exam  Constitutional: He is oriented to person, place, and time. He appears well-developed and well-nourished. No distress.  HENT:  Head: Normocephalic and atraumatic.  Eyes: Conjunctivae and EOM are normal.  Neck: Neck supple. No tracheal deviation present.  Cardiovascular: Normal rate.   No murmur heard. Normal rate, but irregularly irregular rhythm. No murmurs.  Pulmonary/Chest: Effort normal. No respiratory distress.  Musculoskeletal: Normal range of motion.  Neurological: He is alert and oriented to person, place, and time.  Skin: Skin is warm and dry.  Psychiatric: He has a normal mood and affect. His behavior is normal.  Nursing note and vitals reviewed.   ED Course  Procedures (including critical care time)  DIAGNOSTIC STUDIES: Oxygen Saturation is 96% on room air, adequate by my interpretation.    COORDINATION OF CARE: 10:03 AM - Discussed treatment plan with pt at bedside which includes IV fluids, CXR, other diagnostic tests for blood clots, and consult with cardiologist, and pt agreed to plan.   Labs Review Labs Reviewed  BASIC METABOLIC PANEL - Abnormal; Notable for the following:    Glucose, Bld 125 (*)    GFR calc non Af Amer 54 (*)    GFR calc Af Amer 62 (*)    Anion gap 3 (*)    All other components within normal limits  D-DIMER, QUANTITATIVE - Abnormal; Notable for the following:    D-Dimer, Quant 0.52 (*)    All other components within normal limits  CBC WITH DIFFERENTIAL/PLATELET  TROPONIN I    Imaging Review Ct Angio Chest Pe W/cm &/or Wo Cm  01/28/2015    CLINICAL DATA:  SOB with light headedness x 1 month. Hx of HTN, GERD, bradycardia, Prostate Cancer with radiation and radiation seeds  EXAM: CT ANGIOGRAPHY CHEST WITH CONTRAST  TECHNIQUE: Multidetector CT imaging of the chest was performed using the standard protocol during bolus administration of intravenous contrast. Multiplanar CT image reconstructions and MIPs were obtained to evaluate the vascular anatomy.  CONTRAST:  168mL OMNIPAQUE IOHEXOL 350 MG/ML SOLN  COMPARISON:  None available  FINDINGS: Left arm injection. The SVC is patent. Dilated central pulmonary arteries. Satisfactory opacification of pulmonary arteries noted, and there is no evidence of pulmonary emboli. Patent superior and inferior pulmonary veins bilaterally. Scattered Coronary calcifications. Adequate contrast opacification of the thoracic aorta with no evidence of dissection, aneurysm, or stenosis.  There is classic 3-vessel brachiocephalic arch anatomy without proximal stenosis. Coarse partially calcified plaque in the aortic arch and descending segment.  No pleural or pericardial effusion. Subcentimeter prevascular and precarinal lymph nodes. No hilar adenopathy. Advanced emphysematous changes most marked in the apices. Dependent atelectasis posteriorly in both lower lobes. Minimal spurring in the lower thoracic spine. Sternum intact. Probable right renal cyst, incompletely visualized. Remainder visualized upper abdomen unremarkable.  Review of the MIP images confirms the above findings.  IMPRESSION: 1. Negative for acute PE or thoracic aortic dissection. 2. Mild enlargement central pulmonary artery suggesting pulmonary hypertension. 3. Advanced emphysema 4. Atherosclerosis, including aortic and coronary artery disease. Please note that although the presence of coronary artery calcium documents the presence of coronary artery disease, the severity of this disease and any potential stenosis cannot be assessed on this non-gated CT examination.  Assessment for potential risk factor modification, dietary therapy or pharmacologic therapy may be warranted, if clinically indicated.   Electronically Signed   By: Lucrezia Europe M.D.   On: 01/28/2015 12:28   Dg Chest Port 1 View  01/28/2015   CLINICAL DATA:  Dizziness, shortness of breath, fluctuating heart rate, hypertension, prostate cancer, GERD, former smoker  EXAM: PORTABLE CHEST - 1 VIEW  COMPARISON:  Portable exam 1007 hours compared to 12/11/2013  FINDINGS: Upper normal heart size.  Mediastinal contours and pulmonary vascularity normal.  Chronic bronchitic changes.  Underlying emphysematous changes better visualized on previous exam.  No definite acute infiltrate, pleural effusion or pneumothorax.  Bones unremarkable.  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  No acute abnormalities.   Electronically Signed   By: Lavonia Dana M.D.   On: 01/28/2015 10:23     EKG Interpretation   Date/Time:  Thursday January 28 2015 10:02:32 EST Ventricular Rate:  105 PR Interval:    QRS Duration: 99 QT Interval:  341 QTC Calculation: 451 R Axis:   85 Text Interpretation:  Atrial fibrillation Borderline right axis deviation  no acute ST/T changes Confirmed by Gibran Veselka  MD, Douglas Smolinsky ([id number]) on 01/28/2015  4:52:15 PM      MDM   Final diagnoses:  New onset atrial fibrillation    Patient has new onset A. fib. This likely contributing to his intermittent dyspnea and lightheadedness over the past one month. At home he was hypotensive but here he has normal blood pressure. Patient is not having any chest pain or anginal equivalent. Given his prior history of stroke in his age and his chad-vasc score indicates he should be on anticoagulation. I had a long discussion of risks/benefits of this (he has no prior history of significant bleed including no intracranial hemorrhage) and eventually he wants to start on anticoagulation. Will have him f/u closely with PCP and cards. Given  his HR is consistently under 100 and his history of bradycardia will not start on rate control at this time.  I personally performed the services described in this documentation, which was scribed in my presence. The recorded information has been reviewed and is accurate.    Jonathan Hamburger, MD 01/28/15 [phone]

## 2015-01-28 NOTE — ED Notes (Signed)
nad noted prior to dc. Dc instructions reviewed. Voiced understanding. 1 Rx given to pt.

## 2015-01-28 NOTE — Discharge Instructions (Signed)
Anticoagulation, Generic Anticoagulants are medicines used to prevent clots from developing in your veins. These medicine are also known as blood thinners. If blood clots are untreated, they could travel to your lungs. This is called a pulmonary embolus. A blood clot in your lungs can be fatal.  Health care providers often use anticoagulants to prevent clots following surgery. Anticoagulants are also used along with aspirin when the heart is not getting enough blood. Another anticoagulant called warfarin is started 2 to 3 days after a rapid-acting injectable anticoagulant is started. The rapid-acting anticoagulants are usually continued until warfarin has begun to work. Your health care provider will judge this length of time by blood tests known as the prothrombin time (PT) and International Normalization Ratio (INR). This means that your blood is at the necessary and best level to prevent clots. RISKS AND COMPLICATIONS  If you have received recent epidural anesthesia, spinal anesthesia, or a spinal tap while receiving anticoagulants, you are at risk for developing a blood clot in or around the spine. This condition could result in long-term or permanent paralysis.  Because anticoagulants thin your blood, severe bleeding may occur from any tissue or organ. Symptoms of the blood being too thin may include:  Bleeding from the nose or gums that does not stop quickly.  Blood in bowel movements which may appear as bright red, dark, or black tarry stools.  Blood in the urine which may appear as pink, red, or brown urine.  Unusual bruising or bruising easily.  A cut that does not stop bleeding within 10 minutes.  Vomiting blood or continuous nausea for more than 1 day.  Coughing up blood.  Broken blood vessels in your eye (subconjunctival hemorrhage).  Abdominal or back pain with or without flank bruising.  Sudden, severe headache.  Sudden weakness or numbness of the face, arm, or leg,  especially on one side of the body.  Sudden confusion.  Trouble speaking (aphasia) or understanding.  Sudden trouble seeing in one or both eyes.  Sudden trouble walking.  Dizziness.  Loss of balance or coordination.  Vaginal bleeding.  Swelling or pain at an injection site.  Superficial fat tissue death (necrosis) which may cause skin scarring. This is more common in women and may first present as pain in the waist, thighs, or buttocks.  Fever.  Too little anticoagulation continues to allow the risk for blood clots. HOME CARE INSTRUCTIONS   Due to the complications of anticoagulants, it is very important that you take your anticoagulant as directed by your health care provider. Anticoagulants need to be taken exactly as instructed. Be sure you understand all your anticoagulant instructions.  Keep all follow-up appointments with your health care provider as directed. It is very important to keep your appointments. Not keeping appointments could result in a chronic or permanent injury, pain, or disability.  Warfarin. Your health care provider will advise you on the length of treatment (usually 3-6 months, sometimes lifelong).  Take warfarin exactly as directed by your health care provider. It is recommended that you take your warfarin dose at the same time of the day. It is preferred that you take warfarin in the late afternoon. If you have been told to stop taking warfarin, do not resume taking warfarin until directed to do so by your health care provider. Follow your health care provider's instructions if you accidentally take an extra dose or miss a dose of warfarin. It is very important to take warfarin as directed since bleeding or blood  clots could result in chronic or permanent injury, pain, or disability.  Too much and too little warfarin are both dangerous. Too much warfarin increases the risk of bleeding. Too little warfarin continues to allow the risk for blood clots. While  taking warfarin, you will need to have regular blood tests to measure your blood clotting time. These blood tests usually include both the prothrombin time (PT) and International Normalized Ratio (INR) tests. The PT and INR results allow your health care provider to adjust your dose of warfarin. The dose can change for many reasons. It is critically important that you have your PT and INR levels drawn exactly as directed. Your warfarin dose may stay the same or change depending on what the PT and INR results are. Be sure to follow up with your health care provider regarding your PT and INR test results and what your warfarin dosage should be.  Many medicines can interfere with warfarin and affect the PT and INR results. You must tell your health care provider about any and all medicines you take, this includes all vitamins and supplements. Ask your health care provider before taking these. Prescription and over-the-counter medicine consistency is critical to warfarin management. It is important that potential interactions are checked before you start a new medicine. Be especially cautious with aspirin and anti-inflammatory medicines. Ask your health care provider before taking these. Medicines such as antibiotics and acid-reducing medicine can interact with warfarin and can cause an increased warfarin effect. Warfarin can also interfere with the effectiveness of medicines you are taking. Do not take or discontinue any prescribed or over-the-counter medicine except on the advice of your health care provider or pharmacist.  Some vitamins, supplements, and herbal products interfere with the effectiveness of warfarin. Vitamin E may increase the anticoagulant effects of warfarin. Vitamin K may can cause warfarin to be less effective. Do not take or discontinue any vitamin, supplement, or herbal product except on the advice of your health care provider or pharmacist.  Eat what you normally eat and keep the vitamin K  content of your diet consistent. Avoid major changes in your diet, or notify your health care provider before changing your diet. Suddenly getting a lot more vitamin K could cause your blood to clot too quickly. A sudden decrease in vitamin K intake could cause your blood to clot too slowly. These changes in vitamin K intake could lead to dangerous blood clotsor to bleeding. To keep your vitamin K intake consistent, you must be aware of which foods contain moderate or high amounts of vitamin K. Some foods high in vitamin K include spinach, kale, broccoli, cabbage, greens, Brussels sprouts, asparagus, [place], coleslaw, parsley, and green tea. Arrange a visit with a dietitian to answer your questions.  If you have a loss of appetite or get the stomach flu (viral gastroenteritis), talk to your health care provider as soon as possible. A decrease in your normal vitamin K intake can make you more sensitive to your usual dose of warfarin.  Some medical conditions may increase your risk for bleeding while you are taking warfarin. A fever, diarrhea lasting more than a day, worsening heart failure, or worsening liver function are some medical conditions that could affect warfarin. Contact your health care provider if you have any of these medical conditions.  Alcohol can change the body's ability to handle warfarin. It is best to avoid alcoholic drinks or consume only very small amounts while taking warfarin. Notify your health care provider if  you change your alcohol intake. A sudden increase in alcohol use can increase your risk of bleeding. Chronic alcohol use can cause warfarin to be less effective.  Be careful not to cut yourself when using sharp objects or while shaving.  Inform all your health care providers and your dentist that you take an anticoagulant.  Limit physical activities or sports that could result in a fall or cause injury. Avoid contact sports.  Wear medical alert jewelry or carry a  medical alert card. SEEK IMMEDIATE MEDICAL CARE IF:  You cough up blood.  You have dark or black stools or there is bright red blood coming from your rectum.  You vomit blood or have nausea for more than 1 day.  You have blood in the urine or pink colored urine.  You have unusual bruising or have increased bruising.  You have bleeding from the nose or gums that does not stop quickly.  You have a cut that does not stop bleeding within a 2-3 minutes.  You have sudden weakness or numbness of the face, arm, or leg, especially on one side of the body.  You have sudden confusion.  You have trouble speaking (aphasia) or understanding.  You have sudden trouble seeing in one or both eyes.  You have sudden trouble walking.  You have dizziness.  You have a loss of balance or coordination.  You have a sudden, severe headache.  You have a serious fall or head injury, even if you are not bleeding.  You have swelling or pain at an injection site.  You have unexplained tenderness or pain in the abdomen, back, waist, thighs or buttocks.  You have a fever. Any of these symptoms may represent a serious problem that is an emergency. Do not wait to see if the symptoms will go away. Get medical help right away. Call your local emergency services (911 in [place] Do not drive yourself to the hospital. Document Released: 11/20/2005 Document Revised: 11/25/2013 Document Reviewed: 06/24/2008 [facility] Patient Information 2015 [place], [place]. This information is not intended to replace advice given to you by your health care provider. Make sure you discuss any questions you have with your health care provider.     Atrial Fibrillation Atrial fibrillation is a type of irregular heart rhythm (arrhythmia). During atrial fibrillation, the upper chambers of the heart (atria) quiver continuously in a chaotic pattern. This causes an irregular and often rapid heart rate.  Atrial fibrillation is the result  of the heart becoming overloaded with disorganized signals that tell it to beat. These signals are normally released one at a time by a part of the right atrium called the sinoatrial node. They then travel from the atria to the lower chambers of the heart (ventricles), causing the atria and ventricles to contract and pump blood as they pass. In atrial fibrillation, parts of the atria outside of the sinoatrial node also release these signals. This results in two problems. First, the atria receive so many signals that they do not have time to fully contract. Second, the ventricles, which can only receive one signal at a time, beat irregularly and out of rhythm with the atria.  There are three types of atrial fibrillation:   Paroxysmal. Paroxysmal atrial fibrillation starts suddenly and stops on its own within a week.  Persistent. Persistent atrial fibrillation lasts for more than a week. It may stop on its own or with treatment.  Permanent. Permanent atrial fibrillation does not go away. Episodes of atrial fibrillation may  lead to permanent atrial fibrillation. Atrial fibrillation can prevent your heart from pumping blood normally. It increases your risk of stroke and can lead to heart failure.  CAUSES   Heart conditions, including a heart attack, heart failure, coronary artery disease, and heart valve conditions.   Inflammation of the sac that surrounds the heart (pericarditis).  Blockage of an artery in the lungs (pulmonary embolism).  Pneumonia or other infections.  Chronic lung disease.  Thyroid problems, especially if the thyroid is overactive (hyperthyroidism).  Caffeine, excessive alcohol use, and use of some illegal drugs.   Use of some medicines, including certain decongestants and diet pills.  Heart surgery.   Birth defects.  Sometimes, no cause can be found. When this happens, the atrial fibrillation is called lone atrial fibrillation. The risk of complications from atrial  fibrillation increases if you have lone atrial fibrillation and you are age 40 years or older. RISK FACTORS  Heart failure.  Coronary artery disease.  Diabetes mellitus.   High blood pressure (hypertension).   Obesity.   Other arrhythmias.   Increased age. SIGNS AND SYMPTOMS   A feeling that your heart is beating rapidly or irregularly.   A feeling of discomfort or pain in your chest.   Shortness of breath.   Sudden light-headedness or weakness.   Getting tired easily when exercising.   Urinating more often than normal (mainly when atrial fibrillation first begins).  In paroxysmal atrial fibrillation, symptoms may start and suddenly stop. DIAGNOSIS  Your health care provider may be able to detect atrial fibrillation when taking your pulse. Your health care provider may have you take a test called an ambulatory electrocardiogram (ECG). An ECG records your heartbeat patterns over a 24-hour period. You may also have other tests, such as:  Transthoracic echocardiogram (TTE). During echocardiography, sound waves are used to evaluate how blood flows through your heart.  Transesophageal echocardiogram (TEE).  Stress test. There is more than one type of stress test. If a stress test is needed, ask your health care provider about which type is best for you.  Chest X-ray exam.  Blood tests.  Computed tomography (CT). TREATMENT  Treatment may include:  Treating any underlying conditions. For example, if you have an overactive thyroid, treating the condition may correct atrial fibrillation.  Taking medicine. Medicines may be given to control a rapid heart rate or to prevent blood clots, heart failure, or a stroke.  Having a procedure to correct the rhythm of the heart:  Electrical cardioversion. During electrical cardioversion, a controlled, low-energy shock is delivered to the heart through your skin. If you have chest pain, very low blood pressure, or sudden heart  failure, this procedure may need to be done as an emergency.  Catheter ablation. During this procedure, heart tissues that send the signals that cause atrial fibrillation are destroyed.  Surgical ablation. During this surgery, thin lines of heart tissue that carry the abnormal signals are destroyed. This procedure can either be an open-heart surgery or a minimally invasive surgery. With the minimally invasive surgery, small cuts are made to access the heart instead of a large opening.  Pulmonary venous isolation. During this surgery, tissue around the veins that carry blood from the lungs (pulmonary veins) is destroyed. This tissue is thought to carry the abnormal signals. HOME CARE INSTRUCTIONS   Take medicines only as directed by your health care provider. Some medicines can make atrial fibrillation worse or recur.  If blood thinners were prescribed by your health care provider,  take them exactly as directed. Too much blood-thinning medicine can cause bleeding. If you take too little, you will not have the needed protection against stroke and other problems.  Perform blood tests at home if directed by your health care provider. Perform blood tests exactly as directed.  Quit smoking if you smoke.  Do not drink alcohol.  Do not drink caffeinated beverages such as coffee, soda, and some teas. You may drink decaffeinated coffee, soda, or tea.   Maintain a healthy weight.Do not use diet pills unless your health care provider approves. They may make heart problems worse.   Follow diet instructions as directed by your health care provider.  Exercise regularly as directed by your health care provider.  Keep all follow-up visits as directed by your health care provider. This is important. PREVENTION  The following substances can cause atrial fibrillation to recur:   Caffeinated beverages.  Alcohol.  Certain medicines, especially those used for breathing problems.  Certain herbs and  herbal medicines, such as those containing ephedra or ginseng.  Illegal drugs, such as cocaine and amphetamines. Sometimes medicines are given to prevent atrial fibrillation from recurring. Proper treatment of any underlying condition is also important in helping prevent recurrence.  SEEK MEDICAL CARE IF:  You notice a change in the rate, rhythm, or strength of your heartbeat.  You suddenly begin urinating more frequently.  You tire more easily when exerting yourself or exercising. SEEK IMMEDIATE MEDICAL CARE IF:   You have chest pain, abdominal pain, sweating, or weakness.  You feel nauseous.  You have shortness of breath.  You suddenly have swollen feet and ankles.  You feel dizzy.  Your face or limbs feel numb or weak.  You have a change in your vision or speech. MAKE SURE YOU:   Understand these instructions.  Will watch your condition.  Will get help right away if you are not doing well or get worse. Document Released: 11/20/2005 Document Revised: 04/06/2014 Document Reviewed: 12/31/2012 [facility] Patient Information 2015 [place], [place]. This information is not intended to replace advice given to you by your health care provider. Make sure you discuss any questions you have with your health care provider.

## 2015-02-10 ENCOUNTER — Ambulatory Visit (HOSPITAL_COMMUNITY)
Admission: RE | Admit: 2015-02-10 | Discharge: 2015-02-10 | Disposition: A | Discharge status: Home / Self Care | Payer: Medicare Other | Source: Ambulatory Visit | Attending: Cardiology | Admitting: Cardiology

## 2015-02-10 ENCOUNTER — Ambulatory Visit (INDEPENDENT_AMBULATORY_CARE_PROVIDER_SITE_OTHER): Payer: Medicare Other | Admitting: Cardiology

## 2015-02-10 ENCOUNTER — Encounter: Payer: Self-pay | Admitting: Cardiology

## 2015-02-10 VITALS — BP 130/86 | HR 56 | Ht 74.0 in | Wt 224.0 lb

## 2015-02-10 DIAGNOSIS — I10 Essential (primary) hypertension: Secondary | ICD-10-CM

## 2015-02-10 DIAGNOSIS — I4891 Unspecified atrial fibrillation: Secondary | ICD-10-CM | POA: Diagnosis present

## 2015-02-10 DIAGNOSIS — R002 Palpitations: Secondary | ICD-10-CM

## 2015-02-10 DIAGNOSIS — R001 Bradycardia, unspecified: Secondary | ICD-10-CM

## 2015-02-10 NOTE — Progress Notes (Signed)
  Echocardiogram 2D Echocardiogram has been performed.  Jonathan Greene 02/10/2015, 3:24 PM

## 2015-02-10 NOTE — Patient Instructions (Signed)
Your physician recommends that you schedule a follow-up appointment in: 3 weeks   Your physician has recommended that you wear an event monitor for 7 days. Event monitors are medical devices that record the heart's electrical activity. Doctors most often [place] these monitors to diagnose arrhythmias. Arrhythmias are problems with the speed or rhythm of the heartbeat. The monitor is a small, portable device. You can wear one while you do your normal daily activities. This is usually used to diagnose what is causing palpitations/syncope (passing out).   Your physician has requested that you have an echocardiogram. Echocardiography is a painless test that uses sound waves to create images of your heart. It provides your doctor with information about the size and shape of your heart and how well your heart's chambers and valves are working. This procedure takes approximately one hour. There are no restrictions for this procedure.    Please get lab work (TSH)     Thank you for choosing [facility] !

## 2015-02-10 NOTE — Progress Notes (Signed)
Clinical Summary Jonathan Greene is a 74 y.o.male seen today for follow up of the following medical problems.   1. Afib - recent ER visit 01/28/15 with 1 month history of SOB and lightheadness - EKG during evlauation showed afib, a new diagnosis for the patient. K was 3.8. 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  - reports episodes of SOB and lightheadedness, mainly occuring at rest. No palpitations, no chest pain. No syncope - coffee x 3 cups in AM, caffeine sodas, no energy drinks, drinks 4 oz of whisky daily. 2 beers daily in the summer. - started on xarelto in ER.   2. HL - compliant with statin  3. HTN - compliant with meds  4. Hx of CVA - hx of previous stroke 1 year ago.   5. 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 COPD - emphysematous changes on recent CT scan, has not had PFTs as of yet.  - tobacco x 33 years - followed up by pcp     Past Medical History  Diagnosis Date  . Hypercholesterolemia   . Hypertension   . Bradycardia   . Frequent urination   . Urinary urgency   . GERD (gastroesophageal reflux disease)   . Prostate cancer     RADIATION TX AND SCHEDULED FOR SEED IMPLANTS 02-28-12     No Known Allergies   Current Outpatient Prescriptions  Medication Sig Dispense Refill  . aspirin 81 MG tablet Take 81 mg by mouth daily.     Marland Kitchen aspirin-sod bicarb-citric acid (ALKA-SELTZER) 325 MG TBEF tablet Take 325 mg by mouth once as needed (heartburn/indigestion).    Marland Kitchen docusate sodium (COLACE) 100 MG capsule Take 200 mg by mouth 2 (two) times daily.     . fish oil-omega-3 fatty acids 1000 MG capsule Take 1 g by mouth daily.     Marland Kitchen gabapentin (NEURONTIN) 100 MG capsule Take 100 mg by mouth 2 (two) times daily.     Marland Kitchen lisinopril (PRINIVIL,ZESTRIL) 10 MG tablet  Take 5 mg by mouth at bedtime.    . magnesium oxide (MAG-OX) 400 MG tablet Take 400 mg by mouth daily.    Marland Kitchen omeprazole (PRILOSEC) 20 MG capsule Take 20 mg by mouth daily.     . Rivaroxaban (XARELTO) 20 MG TABS tablet Take 1 tablet (20 mg total) by mouth daily with supper. 30 tablet 0  . simvastatin (ZOCOR) 40 MG tablet Take 1 tablet by mouth at bedtime.     . solifenacin (VESICARE) 5 MG tablet Take 5 mg by mouth daily.    . Tamsulosin HCl (FLOMAX) 0.4 MG CAPS Take 0.4 mg by mouth daily.      No current facility-administered medications for this visit.     Past Surgical History  Procedure Laterality Date  . Radioactive seed implant  02/28/2012    Procedure: RADIOACTIVE SEED IMPLANT;  Surgeon: Bernestine Amass, MD;  Location: [facility];  Service: Urology;  Laterality: N/A;  67seeds implanted   . Cystoscopy  02/28/2012    Procedure: CYSTOSCOPY;  Surgeon: Bernestine Amass, MD;  Location: [facility];  Service: Urology;  Laterality: N/A;   no seeds noted in bladder  . Cataract extraction w/phaco Right 11/06/2013    Procedure: CATARACT  EXTRACTION PHACO AND INTRAOCULAR LENS PLACEMENT (IOC);  Surgeon: Tonny Wilder Amodei, MD;  Location: [facility];  Service: Ophthalmology;  Laterality: Right;  CDE 11.67  . Colonoscopy  10/03/2006    QMG:[id number] rectal polyps cold biopsied/removed, otherwise normal rectum/Sigmoid diverticula.  Remainder of colon mucosa appeared normal  . Colonoscopy N/A 08/19/2014    RMR: Radiation proctitis-status post APC ablation. Colonic diverticulosis.     No Known Allergies    Family History  Problem Relation Age of Onset  . Stroke Mother   . Cancer Neg Hx      Social History Jonathan Greene reports that he quit smoking about 23 years ago. His smoking use included Cigarettes. He has a 70 pack-year smoking history. His smokeless tobacco use includes Chew. Jonathan Greene reports that he drinks about 8.4 oz of alcohol per week.   Review of  Systems CONSTITUTIONAL: No weight loss, fever, chills, weakness or fatigue.  HEENT: Eyes: No visual loss, blurred vision, double vision or yellow sclerae.No hearing loss, sneezing, congestion, runny nose or sore throat.  SKIN: No rash or itching.  CARDIOVASCULAR: per HPI RESPIRATORY: No shortness of breath, cough or sputum.  GASTROINTESTINAL: No anorexia, nausea, vomiting or diarrhea. No abdominal pain or blood.  GENITOURINARY: No burning on urination, no polyuria NEUROLOGICAL: No headache, dizziness, syncope, paralysis, ataxia, numbness or tingling in the extremities. No change in bowel or bladder control.  MUSCULOSKELETAL: No muscle, back pain, joint pain or stiffness.  LYMPHATICS: No enlarged nodes. No history of splenectomy.  PSYCHIATRIC: No history of depression or anxiety.  ENDOCRINOLOGIC: No reports of sweating, cold or heat intolerance. No polyuria or polydipsia.  Marland Kitchen   Physical Examination p 56 bp 130/86 Wt 224 lbs BMI 29 Gen: resting comfortably, no acute distress HEENT: no scleral icterus, pupils equal round and reactive, no palptable cervical adenopathy,  CV: irreg, no m/r/g, no JVD, no carotd bruits Resp: Clear to auscultation bilaterally GI: abdomen is soft, non-tender, non-distended, normal bowel sounds, no hepatosplenomegaly MSK: extremities are warm, no edema.  Skin: warm, no rash Neuro:  no focal deficits Psych: appropriate affect     Assessment and Plan  1. Afib - new diagnosis made during recent ER visit. Rates right at 100 in the ER during that visit - unclear if his symptoms of SOB and dizziness could be related to tachycardia or bradycardia given his prior hx of significant bradycardia, will obtain monitor to better correlate. Hold on AV nodal agents at this time - continue xarelto. CHADS2Vasc score is 3. - obtain echo, obtain TSH  2. HL - followed by pcp, currently on statin. Request most recent lipid panel  3. HTN - at goal,continue current meds  4.  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

## 2015-02-11 DIAGNOSIS — I4891 Unspecified atrial fibrillation: Secondary | ICD-10-CM | POA: Insufficient documentation

## 2015-02-11 LAB — TSH: TSH: 3.307 u[IU]/mL (ref 0.350–4.500)

## 2015-02-24 ENCOUNTER — Telehealth: Payer: Self-pay | Admitting: *Deleted

## 2015-02-24 NOTE — Telephone Encounter (Signed)
EOS received and placed on Dr. Nelly Laurence desk

## 2015-03-02 ENCOUNTER — Other Ambulatory Visit: Payer: Self-pay

## 2015-03-02 DIAGNOSIS — R002 Palpitations: Secondary | ICD-10-CM

## 2015-03-16 ENCOUNTER — Encounter: Payer: Self-pay | Admitting: Cardiology

## 2015-03-16 ENCOUNTER — Ambulatory Visit (INDEPENDENT_AMBULATORY_CARE_PROVIDER_SITE_OTHER): Payer: Medicare Other | Admitting: Cardiology

## 2015-03-16 VITALS — BP 112/72 | HR 77 | Ht 75.0 in | Wt 223.6 lb

## 2015-03-16 DIAGNOSIS — R0602 Shortness of breath: Secondary | ICD-10-CM | POA: Diagnosis not present

## 2015-03-16 DIAGNOSIS — R001 Bradycardia, unspecified: Secondary | ICD-10-CM | POA: Diagnosis not present

## 2015-03-16 DIAGNOSIS — I4891 Unspecified atrial fibrillation: Secondary | ICD-10-CM

## 2015-03-16 MED ORDER — RIVAROXABAN 20 MG PO TABS
20.0000 mg | ORAL_TABLET | Freq: Every day | ORAL | Status: DC
Start: 2015-03-16 — End: 2024-10-09

## 2015-03-16 NOTE — Progress Notes (Signed)
Clinical Summary [name] is a 74 y.o.male seen today for follow up of the following medical problems.   1. Afib - recent ER visit 01/28/15 with 1 month history of SOB and lightheadness - EKG during evlauation showed afib, a new diagnosis for the patient. K was 3.8. 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  - started on xarelto in ER, denies any bleeding issues  - since last visit completed monitor which showed no significant arrhythmias. Symptoms correlated with sinus brady to 50s and NSR. No afib noted, no tachycarrhtymias noted.    2. Bradycardia - long history. Patient reportedly refused a pacemaker in the past - ongoing for 20+ years per his report - no recent dizziness, no syncope  3. SOB - emphysematous changes on recent CT scan, has not had PFTs as of yet.  - tobacco x 33 years  Past Medical History  Diagnosis Date  . Hypercholesterolemia   . Hypertension   . Bradycardia   . Frequent urination   . Urinary urgency   . GERD (gastroesophageal reflux disease)   . Prostate cancer     RADIATION TX AND SCHEDULED FOR SEED IMPLANTS 02-28-12     No Known Allergies   Current Outpatient Prescriptions  Medication Sig Dispense Refill  . aspirin-sod bicarb-citric acid (ALKA-SELTZER) 325 MG TBEF tablet Take 325 mg by mouth once as needed (heartburn/indigestion).    Marland Kitchen docusate sodium (COLACE) 100 MG capsule Take 200 mg by mouth 2 (two) times daily.     . fish oil-omega-3 fatty acids 1000 MG capsule Take 1 g by mouth daily.     Marland Kitchen gabapentin (NEURONTIN) 100 MG capsule Take 100 mg by mouth daily. Take 2 tablets by mouth daily at bedtime    . lisinopril (PRINIVIL,ZESTRIL) 10 MG tablet Take 5 mg by mouth at bedtime.    . magnesium oxide (MAG-OX) 400 MG tablet Take 400 mg by mouth daily.    Marland Kitchen omeprazole (PRILOSEC) 20 MG capsule Take 20 mg by mouth daily.     . Rivaroxaban (XARELTO) 20 MG TABS  tablet Take 1 tablet (20 mg total) by mouth daily with supper. 30 tablet 0  . simvastatin (ZOCOR) 40 MG tablet Take 1 tablet by mouth at bedtime.     . solifenacin (VESICARE) 5 MG tablet Take 5 mg by mouth daily.    . Tamsulosin HCl (FLOMAX) 0.4 MG CAPS Take 0.4 mg by mouth 2 (two) times daily.     . Tiotropium Bromide-Olodaterol 2.5-2.5 MCG/ACT AERS Inhale 2 puffs into the lungs daily.     No current facility-administered medications for this visit.     Past Surgical History  Procedure Laterality Date  . Radioactive seed implant  02/28/2012    Procedure: RADIOACTIVE SEED IMPLANT;  Surgeon: Bernestine Amass, MD;  Location: [facility];  Service: Urology;  Laterality: N/A;  67seeds implanted   . Cystoscopy  02/28/2012    Procedure: CYSTOSCOPY;  Surgeon: Bernestine Amass, MD;  Location: [facility];  Service: Urology;  Laterality: N/A;   no seeds noted in bladder  . Cataract extraction w/phaco Right 11/06/2013    Procedure: CATARACT EXTRACTION PHACO AND INTRAOCULAR LENS PLACEMENT (IOC);  Surgeon: Tonny Ayriana Wix, MD;  Location: [facility];  Service: Ophthalmology;  Laterality: Right;  CDE 11.67  . Colonoscopy  10/03/2006    CBU:LAGTXMIWOE rectal polyps cold biopsied/removed, otherwise normal  rectum/Sigmoid diverticula.  Remainder of colon mucosa appeared normal  . Colonoscopy N/A 08/19/2014    RMR: Radiation proctitis-status post APC ablation. Colonic diverticulosis.     No Known Allergies    Family History  Problem Relation Age of Onset  . Stroke Mother   . Cancer Neg Hx      Social History [name] reports that he quit smoking about 24 years ago. His smoking use included Cigarettes. He has a 70 pack-year smoking history. His smokeless tobacco use includes Chew. [name] reports that he drinks about 8.4 oz of alcohol per week.   Review of Systems CONSTITUTIONAL: No weight loss, fever, chills, weakness or fatigue.  HEENT: Eyes: No visual loss, blurred  vision, double vision or yellow sclerae.No hearing loss, sneezing, congestion, runny nose or sore throat.  SKIN: No rash or itching.  CARDIOVASCULAR: per HPI RESPIRATORY: No shortness of breath, cough or sputum.  GASTROINTESTINAL: No anorexia, nausea, vomiting or diarrhea. No abdominal pain or blood.  GENITOURINARY: No burning on urination, no polyuria NEUROLOGICAL: No headache, dizziness, syncope, paralysis, ataxia, numbness or tingling in the extremities. No change in bowel or bladder control.  MUSCULOSKELETAL: No muscle, back pain, joint pain or stiffness.  LYMPHATICS: No enlarged nodes. No history of splenectomy.  PSYCHIATRIC: No history of depression or anxiety.  ENDOCRINOLOGIC: No reports of sweating, cold or heat intolerance. No polyuria or polydipsia.  Marland Kitchen   Physical Examination p 77 bp 112/72 Wt 223 lbs BMI 28 Gen: resting comfortably, no acute distress HEENT: no scleral icterus, pupils equal round and reactive, no palptable cervical adenopathy,  CV: RRR, no m/r/g, no JVD, no carotid bruits Resp: Clear to auscultation bilaterally GI: abdomen is soft, non-tender, non-distended, normal bowel sounds, no hepatosplenomegaly MSK: extremities are warm, no edema.  Skin: warm, no rash Neuro:  no focal deficits Psych: appropriate affect   Diagnostic Studies 02/2015 Echo Study Conclusions  - Left ventricle: The cavity size was normal. Wall thickness was increased in a pattern of mild LVH. Systolic function was normal. The estimated ejection fraction was in the range of 50% to 55%. Wall motion was normal; there were no regional wall motion abnormalities. Doppler parameters are consistent with abnormal left ventricular relaxation (grade 1 diastolic dysfunction). - Aortic valve: Mildly calcified annulus. Trileaflet; mildly thickened leaflets. There was mild regurgitation. Valve area (VTI): 3.12 cm^2. Valve area (Vmax): 3.15 cm^2. - Aortic root: The visualized portion of  the proximal ascending aorta is mildly dilated at 4.0 cm. - Mitral valve: Mildly calcified annulus. Mildly thickened leaflets . - Systemic veins: IVC appears small, suggestive of low RA pressures and hypovolemia. - Technically adequate study.  02/2015 Event monitor Mild sinus brady, no tachyarrhythmias    Assessment and Plan   1. Afib - new diagnosis made during recent ER visit. Rates right at 100 in the ER during that visit. - recent monitor shows NSR and sinus brady, no significant tachycarrhythmias and no severe bradycardia - not requiring AV nodal agents at this time, will continue to avoid due to his long history of sinus brady. When he was in afib rates were not severely elevated. - continue xarelto, CHADS2Vasc score of 4 (age, HTN, prior stroke)   2. 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 SOB - no clear cardiac cause. Long hx of tobacco, CT showed severe emphsymatous changes.  Has not had PFTs - order PFTs   F/u 1 year  Arnoldo Lenis, M.D.

## 2015-03-16 NOTE — Patient Instructions (Signed)
Your physician wants you to follow-up in: 1 year with Dr.Branch You will receive a reminder letter in the mail two months in advance. If you don't receive a letter, please call our office to schedule the follow-up appointment.   Your physician has recommended that you have a pulmonary function test. Pulmonary Function Tests are a group of tests that measure how well air moves in and out of your lungs.    Your physician recommends that you continue on your current medications as directed. Please refer to the Current Medication list given to you today.     Thank you for choosing [facility] !

## 2015-03-19 ENCOUNTER — Ambulatory Visit (HOSPITAL_COMMUNITY)
Admission: RE | Admit: 2015-03-19 | Discharge: 2015-03-19 | Disposition: A | Discharge status: Home / Self Care | Payer: Medicare Other | Source: Ambulatory Visit | Attending: Cardiology | Admitting: Cardiology

## 2015-03-19 DIAGNOSIS — R0602 Shortness of breath: Secondary | ICD-10-CM | POA: Diagnosis not present

## 2015-03-19 LAB — PULMONARY FUNCTION TEST
DL/VA % pred: 52 %
DL/VA: 2.54 ml/min/mmHg/L
DLCO UNC % PRED: 53 %
DLCO UNC: 20.98 ml/min/mmHg
FEF 25-75 Post: 2.94 L/sec
FEF 25-75 Pre: 2.69 L/sec
FEF2575-%Change-Post: 9 %
FEF2575-%Pred-Post: 103 %
FEF2575-%Pred-Pre: 95 %
FEV1-%CHANGE-POST: 0 %
FEV1-%Pred-Post: 95 %
FEV1-%Pred-Pre: 94 %
FEV1-POST: 3.66 L
FEV1-Pre: 3.63 L
FEV1FVC-%CHANGE-POST: 0 %
FEV1FVC-%Pred-Pre: 102 %
FEV6-%Change-Post: 0 %
FEV6-%PRED-POST: 98 %
FEV6-%Pred-Pre: 97 %
FEV6-PRE: 4.82 L
FEV6-Post: 4.85 L
FEV6FVC-%Change-Post: 0 %
FEV6FVC-%PRED-PRE: 104 %
FEV6FVC-%Pred-Post: 103 %
FVC-%Change-Post: 1 %
FVC-%Pred-Post: 94 %
FVC-%Pred-Pre: 93 %
FVC-POST: 4.92 L
FVC-PRE: 4.87 L
POST FEV6/FVC RATIO: 99 %
Post FEV1/FVC ratio: 74 %
Pre FEV1/FVC ratio: 74 %
Pre FEV6/FVC Ratio: 99 %
RV % pred: 94 %
RV: 2.64 L
TLC % pred: 94 %
TLC: 7.56 L

## 2015-03-19 MED ORDER — ALBUTEROL SULFATE (2.5 MG/3ML) 0.083% IN NEBU
2.5000 mg | INHALATION_SOLUTION | Freq: Once | RESPIRATORY_TRACT | Status: AC
  Administered 2015-03-19: 2.5 mg via RESPIRATORY_TRACT

## 2015-03-31 ENCOUNTER — Telehealth: Payer: Self-pay

## 2015-03-31 DIAGNOSIS — R0602 Shortness of breath: Secondary | ICD-10-CM

## 2015-03-31 NOTE — Telephone Encounter (Signed)
-----   Message from Arnoldo Lenis, MD sent at 03/31/2015 10:16 AM EDT ----- Breathing tests show no evidence of COPD, but do show that his lungs have decreased ability to absorb oxygen which could be causing his symptoms. Please refer to Dr Luan Pulling.   Zandra Abts MD

## 2015-03-31 NOTE — Telephone Encounter (Signed)
Pt aware,ref placed

## 2015-05-18 ENCOUNTER — Other Ambulatory Visit (HOSPITAL_COMMUNITY): Payer: Self-pay | Admitting: Respiratory Therapy

## 2015-05-18 DIAGNOSIS — G4730 Sleep apnea, unspecified: Secondary | ICD-10-CM

## 2015-05-20 ENCOUNTER — Other Ambulatory Visit (HOSPITAL_COMMUNITY): Payer: Self-pay | Admitting: Respiratory Therapy

## 2015-05-24 ENCOUNTER — Other Ambulatory Visit (HOSPITAL_COMMUNITY): Payer: Self-pay | Admitting: Pulmonary Disease

## 2015-05-24 DIAGNOSIS — R0602 Shortness of breath: Secondary | ICD-10-CM

## 2015-05-26 ENCOUNTER — Ambulatory Visit (HOSPITAL_COMMUNITY): Admission: RE | Admit: 2015-05-26 | Payer: Medicare Other | Source: Ambulatory Visit

## 2015-06-01 ENCOUNTER — Ambulatory Visit (HOSPITAL_COMMUNITY): Payer: Medicare Other

## 2015-06-09 ENCOUNTER — Ambulatory Visit (HOSPITAL_COMMUNITY)
Admission: RE | Admit: 2015-06-09 | Discharge: 2015-06-09 | Disposition: A | Discharge status: Home / Self Care | Payer: Medicare Other | Source: Ambulatory Visit | Attending: Pulmonary Disease | Admitting: Pulmonary Disease

## 2015-06-09 DIAGNOSIS — R0602 Shortness of breath: Secondary | ICD-10-CM | POA: Diagnosis present

## 2015-06-09 DIAGNOSIS — Z8546 Personal history of malignant neoplasm of prostate: Secondary | ICD-10-CM | POA: Insufficient documentation

## 2015-06-09 DIAGNOSIS — Z923 Personal history of irradiation: Secondary | ICD-10-CM | POA: Insufficient documentation

## 2015-06-09 DIAGNOSIS — Z87891 Personal history of nicotine dependence: Secondary | ICD-10-CM | POA: Diagnosis not present

## 2015-06-09 LAB — POCT I-STAT CREATININE: Creatinine, Ser: 1.3 mg/dL — ABNORMAL HIGH (ref 0.61–1.24)

## 2015-06-09 MED ORDER — IOHEXOL 300 MG/ML  SOLN
100.0000 mL | Freq: Once | INTRAMUSCULAR | Status: AC | PRN
  Administered 2015-06-09: 100 mL via INTRAVENOUS

## 2015-06-14 ENCOUNTER — Other Ambulatory Visit (HOSPITAL_COMMUNITY): Payer: Self-pay | Admitting: Respiratory Therapy

## 2015-06-16 ENCOUNTER — Other Ambulatory Visit (HOSPITAL_COMMUNITY): Payer: Self-pay | Admitting: Respiratory Therapy

## 2015-06-30 ENCOUNTER — Ambulatory Visit: Payer: Medicare Other | Attending: Pulmonary Disease | Admitting: Sleep Medicine

## 2015-06-30 ENCOUNTER — Other Ambulatory Visit (HOSPITAL_COMMUNITY): Payer: Self-pay | Admitting: Respiratory Therapy

## 2015-06-30 DIAGNOSIS — G4733 Obstructive sleep apnea (adult) (pediatric): Secondary | ICD-10-CM | POA: Insufficient documentation

## 2015-06-30 DIAGNOSIS — R0683 Snoring: Secondary | ICD-10-CM | POA: Insufficient documentation

## 2015-06-30 DIAGNOSIS — I4891 Unspecified atrial fibrillation: Secondary | ICD-10-CM | POA: Diagnosis not present

## 2015-06-30 DIAGNOSIS — I493 Ventricular premature depolarization: Secondary | ICD-10-CM | POA: Diagnosis not present

## 2015-06-30 DIAGNOSIS — G4730 Sleep apnea, unspecified: Secondary | ICD-10-CM

## 2015-07-09 ENCOUNTER — Other Ambulatory Visit (HOSPITAL_COMMUNITY): Payer: Self-pay | Admitting: Respiratory Therapy

## 2015-07-19 ENCOUNTER — Other Ambulatory Visit (HOSPITAL_COMMUNITY): Payer: Self-pay | Admitting: Respiratory Therapy

## 2015-07-21 ENCOUNTER — Other Ambulatory Visit (HOSPITAL_COMMUNITY): Payer: Self-pay | Admitting: Respiratory Therapy

## 2015-07-26 ENCOUNTER — Other Ambulatory Visit (HOSPITAL_COMMUNITY): Payer: Self-pay | Admitting: Respiratory Therapy

## 2015-07-30 NOTE — Sleep Study (Signed)
  Jay A. Merlene Laughter, MD     [email or web address]             NOCTURNAL POLYSOMNOGRAPHY   LOCATION: [facility]  Patient Name: Jonathan Greene, Jonathan Greene Date: 06/30/2015 Gender: Male D.O.B: [birth year] Age (years): 73 Referring Provider: Lennox Solders Height (inches): 72 Interpreting Physician: Phillips Odor MD, ABSM Weight (lbs): 210 RPSGT: Mort Sawyers BMI: 28 MRN: [id number] Neck Size: 16.50 CLINICAL INFORMATION Sleep Study Type: Split Night CPAP  Indication for sleep study: N/A  Epworth Sleepiness Score:  SLEEP STUDY TECHNIQUE As per the AASM Manual for the Scoring of Sleep and Associated Events v2.3 (April 2016) with a hypopnea requiring 4% desaturations.  The channels recorded and monitored were frontal, central and occipital EEG, electrooculogram (EOG), submentalis EMG (chin), nasal and oral airflow, thoracic and abdominal wall motion, anterior tibialis EMG, snore microphone, electrocardiogram, and pulse oximetry. Continuous positive airway pressure (CPAP) was initiated when the patient met split night criteria and was titrated according to treat sleep-disordered breathing.  MEDICATIONS Medications taken by the patient : N/A  Medications administered by patient during sleep study : No sleep medicine administered. RESPIRATORY PARAMETERS Diagnostic  Total AHI (/hr): 36.3 RDI (/hr): 36.3 OA Index (/hr): 0.5 CA Index (/hr): 0.5 REM AHI (/hr): 43.6 NREM AHI (/hr): 36.0 Supine AHI (/hr): N/A Non-supine AHI (/hr): 36.32 Min O2 Sat (%): 82.00 Mean O2 (%): 91.75 Time below 88% (min): 14.1   Titration  Optimal Pressure (cm):  AHI at Optimal Pressure (/hr): N/A Min O2 at Optimal Pressure (%): 85.00 Supine % at Optimal (%): N/A Sleep % at Optimal (%): N/A   SLEEP ARCHITECTURE The recording time for the entire night was 368.8 minutes.  During a baseline period of 179.5 minutes, the patient slept for 130.5 minutes in REM and nonREM, yielding a sleep  efficiency of 72.7%. Sleep onset after lights out was 29.6 minutes with a REM latency of 63.0 minutes. The patient spent 19.54% of the night in stage N1 sleep, 76.25% in stage N2 sleep, 0.00% in stage N3 and 4.21% in REM.  During the titration period of 188.2 minutes, the patient slept for 50.5 minutes in REM and nonREM, yielding a sleep efficiency of 26.8%. Sleep onset after CPAP initiation was 66.4 minutes with a REM latency of 87.0 minutes. The patient spent 5.94% of the night in stage N1 sleep, 76.24% in stage N2 sleep, 0.00% in stage N3 and 17.82% in REM.  CARDIAC DATA The 2 lead EKG demonstrated atrial fibrillation. The mean heart rate was N/A beats per minute. Other EKG findings include: Atrial Fibrillation, PVCs. LEG MOVEMENT DATA The total Periodic Limb Movements of Sleep (PLMS) were 19. The PLMS index was 6.30 .  IMPRESSIONS Severe obstructive sleep apnea occurred during the diagnostic portion of the study (AHI = 36.3/hour). An optimal PAP pressure could not be selected for this patient based on the available study data. No significant central sleep apnea occurred during the diagnostic portion of the study (CAI = 0.5/hour). Moderate oxygen desaturation was noted during the diagnostic portion of the study (Min O2 =82.00%). The patient snored with Moderate snoring volume during the diagnostic portion of the study. EKG findings include Atrial Fibrillation, PVCs. Clinically significant periodic limb movements did not occur during sleep.   RECOMMENDATIONS AutoPAP 5-20 given sub-optimal CPAP titration.     Delano Metz, MD Diplomate, American Board of Sleep Medicine.

## 2015-11-23 ENCOUNTER — Encounter (HOSPITAL_COMMUNITY): Payer: Self-pay

## 2015-11-23 ENCOUNTER — Other Ambulatory Visit: Payer: Self-pay

## 2015-11-23 ENCOUNTER — Encounter (HOSPITAL_COMMUNITY)
Admission: RE | Admit: 2015-11-23 | Discharge: 2015-11-23 | Disposition: A | Discharge status: Home / Self Care | Payer: Medicare Other | Source: Ambulatory Visit | Attending: Ophthalmology | Admitting: Ophthalmology

## 2015-11-23 DIAGNOSIS — J449 Chronic obstructive pulmonary disease, unspecified: Secondary | ICD-10-CM | POA: Diagnosis not present

## 2015-11-23 DIAGNOSIS — Z01810 Encounter for preprocedural cardiovascular examination: Secondary | ICD-10-CM | POA: Diagnosis not present

## 2015-11-23 DIAGNOSIS — Z7901 Long term (current) use of anticoagulants: Secondary | ICD-10-CM | POA: Diagnosis not present

## 2015-11-23 DIAGNOSIS — Z79899 Other long term (current) drug therapy: Secondary | ICD-10-CM | POA: Diagnosis not present

## 2015-11-23 DIAGNOSIS — H25812 Combined forms of age-related cataract, left eye: Secondary | ICD-10-CM | POA: Diagnosis not present

## 2015-11-23 DIAGNOSIS — H2181 Floppy iris syndrome: Secondary | ICD-10-CM | POA: Diagnosis not present

## 2015-11-23 DIAGNOSIS — Z8546 Personal history of malignant neoplasm of prostate: Secondary | ICD-10-CM | POA: Diagnosis not present

## 2015-11-23 DIAGNOSIS — Z87891 Personal history of nicotine dependence: Secondary | ICD-10-CM | POA: Diagnosis not present

## 2015-11-23 DIAGNOSIS — I10 Essential (primary) hypertension: Secondary | ICD-10-CM | POA: Diagnosis not present

## 2015-11-23 DIAGNOSIS — K219 Gastro-esophageal reflux disease without esophagitis: Secondary | ICD-10-CM | POA: Diagnosis not present

## 2015-11-23 DIAGNOSIS — G4730 Sleep apnea, unspecified: Secondary | ICD-10-CM | POA: Diagnosis not present

## 2015-11-23 HISTORY — DX: Cerebral infarction, unspecified: I63.9

## 2015-11-23 HISTORY — DX: Chronic obstructive pulmonary disease, unspecified: J44.9

## 2015-11-23 HISTORY — DX: Sleep apnea, unspecified: G47.30

## 2015-11-23 NOTE — Pre-Procedure Instructions (Signed)
Patient given information to sign up for my chart at home. 

## 2015-11-23 NOTE — Patient Instructions (Signed)
Your procedure is scheduled on: 11/25/2015  Report to [facility] at  1000  AM.  Call this number if you have problems the morning of surgery: [phone]   Do not eat food or drink liquids :After Midnight.      Take these medicines the morning of surgery with A SIP OF WATER: neurontin, lisinopril, prilosec, flomax.   Do not wear jewelry, make-up or nail polish.  Do not wear lotions, powders, or perfumes. You may wear deodorant.  Do not shave 48 hours prior to surgery.  Do not bring valuables to the hospital.  Contacts, dentures or bridgework may not be worn into surgery.  Leave suitcase in the car. After surgery it may be brought to your room.  For patients admitted to the hospital, checkout time is 11:00 AM the day of discharge.   Patients discharged the day of surgery will not be allowed to drive home.  :     Please read over the following fact sheets that you were given: Coughing and Deep Breathing, Surgical Site Infection Prevention, Anesthesia Post-op Instructions and Care and Recovery After Surgery    Cataract A cataract is a clouding of the lens of the eye. When a lens becomes cloudy, vision is reduced based on the degree and nature of the clouding. Many cataracts reduce vision to some degree. Some cataracts make people more near-sighted as they develop. Other cataracts increase glare. Cataracts that are ignored and become worse can sometimes look white. The white color can be seen through the pupil. CAUSES   Aging. However, cataracts may occur at any age, even in newborns.   Certain drugs.   Trauma to the eye.   Certain diseases such as diabetes.   Specific eye diseases such as chronic inflammation inside the eye or a sudden attack of a rare form of glaucoma.   Inherited or acquired medical problems.  SYMPTOMS   Gradual, progressive drop in vision in the affected eye.   Severe, rapid visual loss. This most often happens when trauma is the cause.  DIAGNOSIS  To detect  a cataract, an eye doctor examines the lens. Cataracts are best diagnosed with an exam of the eyes with the pupils enlarged (dilated) by drops.  TREATMENT  For an early cataract, vision may improve by using different eyeglasses or stronger lighting. If that does not help your vision, surgery is the only effective treatment. A cataract needs to be surgically removed when vision loss interferes with your everyday activities, such as driving, reading, or watching TV. A cataract may also have to be removed if it prevents examination or treatment of another eye problem. Surgery removes the cloudy lens and usually replaces it with a substitute lens (intraocular lens, IOL).  At a time when both you and your doctor agree, the cataract will be surgically removed. If you have cataracts in both eyes, only one is usually removed at a time. This allows the operated eye to heal and be out of danger from any possible problems after surgery (such as infection or poor wound healing). In rare cases, a cataract may be doing damage to your eye. In these cases, your caregiver may advise surgical removal right away. The vast majority of people who have cataract surgery have better vision afterward. HOME CARE INSTRUCTIONS  If you are not planning surgery, you may be asked to do the following:  Use different eyeglasses.   Use stronger or brighter lighting.   Ask your eye doctor about reducing your  medicine dose or changing medicines if it is thought that a medicine caused your cataract. Changing medicines does not make the cataract go away on its own.   Become familiar with your surroundings. Poor vision can lead to injury. Avoid bumping into things on the affected side. You are at a higher risk for tripping or falling.   Exercise extreme care when driving or operating machinery.   Wear sunglasses if you are sensitive to bright light or experiencing problems with glare.  SEEK IMMEDIATE MEDICAL CARE IF:   You have a  worsening or sudden vision loss.   You notice redness, swelling, or increasing pain in the eye.   You have a fever.  Document Released: 11/20/2005 Document Revised: 11/09/2011 Document Reviewed: 07/14/2011 [facility] Patient Information 2012 [place].PATIENT INSTRUCTIONS POST-ANESTHESIA  IMMEDIATELY FOLLOWING SURGERY:  Do not drive or operate machinery for the first twenty four hours after surgery.  Do not make any important decisions for twenty four hours after surgery or while taking narcotic pain medications or sedatives.  If you develop intractable nausea and vomiting or a severe headache please notify your doctor immediately.  FOLLOW-UP:  Please make an appointment with your surgeon as instructed. You do not need to follow up with anesthesia unless specifically instructed to do so.  WOUND CARE INSTRUCTIONS (if applicable):  Keep a dry clean dressing on the anesthesia/puncture wound site if there is drainage.  Once the wound has quit draining you may leave it open to air.  Generally you should leave the bandage intact for twenty four hours unless there is drainage.  If the epidural site drains for more than 36-48 hours please call the anesthesia department.  QUESTIONS?:  Please feel free to call your physician or the hospital operator if you have any questions, and they will be happy to assist you.

## 2015-11-25 ENCOUNTER — Ambulatory Visit (HOSPITAL_COMMUNITY): Payer: Medicare Other | Admitting: Anesthesiology

## 2015-11-25 ENCOUNTER — Encounter (HOSPITAL_COMMUNITY): Payer: Self-pay | Admitting: Anesthesiology

## 2015-11-25 ENCOUNTER — Encounter (HOSPITAL_COMMUNITY)
Admission: RE | Disposition: A | Discharge status: Home / Self Care | Payer: Self-pay | Source: Ambulatory Visit | Attending: Ophthalmology

## 2015-11-25 ENCOUNTER — Ambulatory Visit (HOSPITAL_COMMUNITY)
Admission: RE | Admit: 2015-11-25 | Discharge: 2015-11-25 | Disposition: A | Discharge status: Home / Self Care | Payer: Medicare Other | Source: Ambulatory Visit | Attending: Ophthalmology | Admitting: Ophthalmology

## 2015-11-25 DIAGNOSIS — Z79899 Other long term (current) drug therapy: Secondary | ICD-10-CM | POA: Insufficient documentation

## 2015-11-25 DIAGNOSIS — H2181 Floppy iris syndrome: Secondary | ICD-10-CM | POA: Insufficient documentation

## 2015-11-25 DIAGNOSIS — Z01810 Encounter for preprocedural cardiovascular examination: Secondary | ICD-10-CM | POA: Insufficient documentation

## 2015-11-25 DIAGNOSIS — I10 Essential (primary) hypertension: Secondary | ICD-10-CM | POA: Diagnosis not present

## 2015-11-25 DIAGNOSIS — K219 Gastro-esophageal reflux disease without esophagitis: Secondary | ICD-10-CM | POA: Insufficient documentation

## 2015-11-25 DIAGNOSIS — G4730 Sleep apnea, unspecified: Secondary | ICD-10-CM | POA: Insufficient documentation

## 2015-11-25 DIAGNOSIS — Z8546 Personal history of malignant neoplasm of prostate: Secondary | ICD-10-CM | POA: Insufficient documentation

## 2015-11-25 DIAGNOSIS — J449 Chronic obstructive pulmonary disease, unspecified: Secondary | ICD-10-CM | POA: Insufficient documentation

## 2015-11-25 DIAGNOSIS — H25812 Combined forms of age-related cataract, left eye: Secondary | ICD-10-CM | POA: Diagnosis not present

## 2015-11-25 DIAGNOSIS — Z87891 Personal history of nicotine dependence: Secondary | ICD-10-CM | POA: Insufficient documentation

## 2015-11-25 DIAGNOSIS — Z7901 Long term (current) use of anticoagulants: Secondary | ICD-10-CM | POA: Insufficient documentation

## 2015-11-25 HISTORY — PX: CATARACT EXTRACTION W/PHACO: SHX586

## 2015-11-25 SURGERY — PHACOEMULSIFICATION, CATARACT, WITH IOL INSERTION
Anesthesia: Monitor Anesthesia Care | Site: Eye | Laterality: Left

## 2015-11-25 MED ORDER — MIDAZOLAM HCL 2 MG/2ML IJ SOLN
1.0000 mg | INTRAMUSCULAR | Status: DC | PRN
  Administered 2015-11-25: 2 mg via INTRAVENOUS

## 2015-11-25 MED ORDER — LACTATED RINGERS IV SOLN
INTRAVENOUS | Status: DC
  Administered 2015-11-25: 10:00:00 via INTRAVENOUS

## 2015-11-25 MED ORDER — MIDAZOLAM HCL 2 MG/2ML IJ SOLN
INTRAMUSCULAR | Status: AC
  Filled 2015-11-25: qty 2

## 2015-11-25 MED ORDER — LIDOCAINE 3.5 % OP GEL OPTIME - NO CHARGE
OPHTHALMIC | Status: DC | PRN
  Administered 2015-11-25: 1 [drp] via OPHTHALMIC

## 2015-11-25 MED ORDER — EPINEPHRINE HCL 1 MG/ML IJ SOLN
INTRAMUSCULAR | Status: AC
  Filled 2015-11-25: qty 1

## 2015-11-25 MED ORDER — PROVISC 10 MG/ML IO SOLN
INTRAOCULAR | Status: DC | PRN
  Administered 2015-11-25: 0.85 mL via INTRAOCULAR

## 2015-11-25 MED ORDER — NEOMYCIN-POLYMYXIN-DEXAMETH 3.5-10000-0.1 OP SUSP
OPHTHALMIC | Status: DC | PRN
  Administered 2015-11-25: 2 [drp] via OPHTHALMIC

## 2015-11-25 MED ORDER — LIDOCAINE HCL (PF) 1 % IJ SOLN
INTRAOCULAR | Status: DC | PRN
  Administered 2015-11-25: .8 mL via OPHTHALMIC

## 2015-11-25 MED ORDER — POVIDONE-IODINE 5 % OP SOLN
OPHTHALMIC | Status: DC | PRN
  Administered 2015-11-25: 1 via OPHTHALMIC

## 2015-11-25 MED ORDER — LIDOCAINE HCL 3.5 % OP GEL
1.0000 "application " | Freq: Once | OPHTHALMIC | Status: AC
  Administered 2015-11-25: 1 via OPHTHALMIC

## 2015-11-25 MED ORDER — TETRACAINE HCL 0.5 % OP SOLN
1.0000 [drp] | OPHTHALMIC | Status: AC
  Administered 2015-11-25 (×3): 1 [drp] via OPHTHALMIC

## 2015-11-25 MED ORDER — CYCLOPENTOLATE-PHENYLEPHRINE 0.2-1 % OP SOLN
1.0000 [drp] | OPHTHALMIC | Status: AC
  Administered 2015-11-25 (×3): 1 [drp] via OPHTHALMIC

## 2015-11-25 MED ORDER — BSS IO SOLN
INTRAOCULAR | Status: DC | PRN
  Administered 2015-11-25: 500 mL

## 2015-11-25 MED ORDER — PHENYLEPHRINE HCL 2.5 % OP SOLN
1.0000 [drp] | OPHTHALMIC | Status: AC
  Administered 2015-11-25 (×3): 1 [drp] via OPHTHALMIC

## 2015-11-25 MED ORDER — BSS IO SOLN
INTRAOCULAR | Status: DC | PRN
  Administered 2015-11-25: 15 mL via INTRAOCULAR

## 2015-11-25 SURGICAL SUPPLY — 12 items
CLOTH BEACON ORANGE TIMEOUT ST (SAFETY) ×2 IMPLANT
EYE SHIELD UNIVERSAL CLEAR (GAUZE/BANDAGES/DRESSINGS) ×2 IMPLANT
GLOVE BIOGEL PI IND STRL 7.0 (GLOVE) IMPLANT
GLOVE BIOGEL PI INDICATOR 7.0 (GLOVE) ×2
GLOVE EXAM NITRILE MD LF STRL (GLOVE) ×2 IMPLANT
PAD ARMBOARD 7.5X6 YLW CONV (MISCELLANEOUS) ×2 IMPLANT
RING MALYGIN (MISCELLANEOUS) ×2 IMPLANT
SIGHTPATH CAT PROC W REG LENS (Ophthalmic Related) ×3 IMPLANT
SYRINGE LUER LOK 1CC (MISCELLANEOUS) ×2 IMPLANT
TAPE SURG TRANSPORE 1 IN (GAUZE/BANDAGES/DRESSINGS) IMPLANT
TAPE SURGICAL TRANSPORE 1 IN (GAUZE/BANDAGES/DRESSINGS) ×2
WATER STERILE IRR 250ML POUR (IV SOLUTION) ×2 IMPLANT

## 2015-11-25 NOTE — Anesthesia Postprocedure Evaluation (Signed)
Anesthesia Post Note  Patient: Jonathan Greene  Procedure(s) Performed: Procedure(s) (LRB): CATARACT EXTRACTION PHACO AND INTRAOCULAR LENS PLACEMENT ; CDE:  8.15 (Left)  Patient location during evaluation: Short Stay Anesthesia Type: MAC Level of consciousness: awake and alert and oriented Pain management: pain level controlled Vital Signs Assessment: post-procedure vital signs reviewed and stable Respiratory status: respiratory function stable and spontaneous breathing Cardiovascular status: stable Postop Assessment: no signs of nausea or vomiting Anesthetic complications: no    Last Vitals:  Filed Vitals:   11/25/15 1035 11/25/15 1040  BP: 108/59 103/68  Pulse:    Temp:    Resp: 22 24    Last Pain: There were no vitals filed for this visit.               ADAMS, AMY A

## 2015-11-25 NOTE — H&P (Signed)
I have reviewed the H&P, the patient was re-examined, and I have identified no interval changes in medical condition and plan of care since the history and physical of record  

## 2015-11-25 NOTE — Addendum Note (Signed)
Addendum  created 11/25/15 1133 by Rusty Aus, MD   Modules edited: Anesthesia Attestations

## 2015-11-25 NOTE — Discharge Instructions (Signed)
Monitored Anesthesia Care Monitored anesthesia care is an anesthesia service for a medical procedure. Anesthesia is the loss of the ability to feel pain. It is produced by medicines called anesthetics. It may affect a small area of your body (local anesthesia), a large area of your body (regional anesthesia), or your entire body (general anesthesia). The need for monitored anesthesia care depends your procedure, your condition, and the potential need for regional or general anesthesia. It is often provided during procedures where:   General anesthesia may be needed if there are complications. This is because you need special care when you are under general anesthesia.   You will be under local or regional anesthesia. This is so that you are able to have higher levels of anesthesia if needed.   You will receive calming medicines (sedatives). This is especially the case if sedatives are given to put you in a semi-conscious state of relaxation (deep sedation). This is because the amount of sedative needed to produce this state can be hard to predict. Too much of a sedative can produce general anesthesia. Monitored anesthesia care is performed by one or more health care providers who have special training in all types of anesthesia. You will need to meet with these health care providers before your procedure. During this meeting, they will ask you about your medical history. They will also give you instructions to follow. (For example, you will need to stop eating and drinking before your procedure. You may also need to stop or change medicines you are taking.) During your procedure, your health care providers will stay with you. They will:   Watch your condition. This includes watching your blood pressure, breathing, and level of pain.   Diagnose and treat problems that occur.   Give medicines if they are needed. These may include calming medicines (sedatives) and anesthetics.   Make sure you are  comfortable.  Having monitored anesthesia care does not necessarily mean that you will be under anesthesia. It does mean that your health care providers will be able to manage anesthesia if you need it or if it occurs. It also means that you will be able to have a different type of anesthesia than you are having if you need it. When your procedure is complete, your health care providers will continue to watch your condition. They will make sure any medicines wear off before you are allowed to go home.    This information is not intended to replace advice given to you by your health care provider. Make sure you discuss any questions you have with your health care provider.   Document Released: 08/16/2005 Document Revised: 12/11/2014 Document Reviewed: 01/01/2013 Elsevier Interactive Patient Education [employer].

## 2015-11-25 NOTE — Transfer of Care (Signed)
Immediate Anesthesia Transfer of Care Note  Patient: Jonathan Greene  Procedure(s) Performed: Procedure(s): CATARACT EXTRACTION PHACO AND INTRAOCULAR LENS PLACEMENT ; CDE:  8.15 (Left)  Patient Location: Short Stay  Anesthesia Type:MAC  Level of Consciousness: awake, alert , oriented and patient cooperative  Airway & Oxygen Therapy: Patient Spontanous Breathing  Post-op Assessment: Report given to RN and Post -op Vital signs reviewed and stable  Post vital signs: Reviewed and stable  Last Vitals:  Filed Vitals:   11/25/15 1035 11/25/15 1040  BP: 108/59 103/68  Pulse:    Temp:    Resp: 22 24    Complications: No apparent anesthesia complications

## 2015-11-25 NOTE — Anesthesia Preprocedure Evaluation (Signed)
Anesthesia Evaluation  Patient identified by MRN, date of birth, ID band Patient awake    Reviewed: Allergy & Precautions, NPO status , Patient's Chart, lab work & pertinent test results  Airway Mallampati: II       Dental  (+) Edentulous Upper, Edentulous Lower, Lower Dentures, Upper Dentures   Pulmonary sleep apnea , COPD,  COPD inhaler, former smoker,    Pulmonary exam normal        Cardiovascular hypertension, Pt. on medications Normal cardiovascular exam     Neuro/Psych CVA, No Residual Symptoms    GI/Hepatic GERD  Medicated and Controlled,  Endo/Other    Renal/GU      Musculoskeletal   Abdominal Normal abdominal exam  (+)   Peds  Hematology   Anesthesia Other Findings   Reproductive/Obstetrics                             Anesthesia Physical Anesthesia Plan  ASA: III  Anesthesia Plan: MAC   Post-op Pain Management:    Induction: Intravenous  Airway Management Planned: Nasal Cannula  Additional Equipment:   Intra-op Plan:   Post-operative Plan:   Informed Consent: I have reviewed the patients History and Physical, chart, labs and discussed the procedure including the risks, benefits and alternatives for the proposed anesthesia with the patient or authorized representative who has indicated his/her understanding and acceptance.     Plan Discussed with: CRNA  Anesthesia Plan Comments:         Anesthesia Quick Evaluation

## 2015-11-25 NOTE — Op Note (Signed)
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  Assistants: None  Anesthesia: Topical with MAC  Indications: Painless, progressive loss of vision with compromise of daily activities.  Surgery: Cataract Extraction with Intraocular lens Implant Left Eye  Discription: The patient had dilating drops and viscous lidocaine placed into the left eye in the pre-op holding area. After transfer to the operating room, a time out was performed. The patient was then prepped and draped. Beginning with a 86 degree blade a paracentesis port was made at the surgeon's 2 o'clock position. The anterior chamber was then filled with 1% non-preserved lidocaine with epinepherine. This was followed by filling the anterior chamber with Provisc. A Malyugan ring was placed into the anterior chamber using its injector. The loops were positioned with the Kuglan hook. A bent cystatome needle was used to create a continuous tear capsulotomy. Hydrodissection was performed with balanced salt solution on a Fine canula. The lens nucleus was then removed using the phacoemulsification handpiece. Residual cortex was removed with the I&A handpiece. The anterior chamber and capsular bag were refilled with Provisc. A posterior chamber intraocular lens was placed into the capsular bag with it's injector. The implant was positioned with the Kuglan hook. The Malyugan ring was disengaged from the iris margin and removed with its injector. The Provisc was then removed from the anterior chamber and capsular bag with the I&A handpiece. Stromal hydration of the main incision and paracentesis port was performed with BSS on a Fine canula. The wounds were tested for leak which was negative. The patient tolerated the procedure well. There were no operative  complications. The patient was then transferred to the recovery room in stable condition.  Complications: None  Specimen: None  EBL: None  Prosthetic device: B&L enVista, MX60, power 18.5, SN [id number].

## 2015-11-25 NOTE — Anesthesia Procedure Notes (Signed)
Procedure Name: MAC Date/Time: 11/25/2015 10:45 AM Performed by: Andree Elk, AMY A Pre-anesthesia Checklist: Patient identified, Timeout performed, Emergency Drugs available, Suction available and Patient being monitored Oxygen Delivery Method: Nasal cannula

## 2015-11-26 ENCOUNTER — Encounter (HOSPITAL_COMMUNITY): Payer: Self-pay | Admitting: Ophthalmology

## 2015-12-17 ENCOUNTER — Ambulatory Visit (INDEPENDENT_AMBULATORY_CARE_PROVIDER_SITE_OTHER): Payer: Medicare Other | Admitting: Urology

## 2015-12-17 DIAGNOSIS — R3915 Urgency of urination: Secondary | ICD-10-CM | POA: Diagnosis not present

## 2015-12-17 DIAGNOSIS — N5201 Erectile dysfunction due to arterial insufficiency: Secondary | ICD-10-CM

## 2015-12-17 DIAGNOSIS — Z8546 Personal history of malignant neoplasm of prostate: Secondary | ICD-10-CM | POA: Diagnosis not present

## 2015-12-17 DIAGNOSIS — R5383 Other fatigue: Secondary | ICD-10-CM | POA: Diagnosis not present

## 2016-02-29 ENCOUNTER — Encounter (HOSPITAL_COMMUNITY): Payer: Self-pay | Admitting: *Deleted

## 2016-02-29 ENCOUNTER — Emergency Department (HOSPITAL_COMMUNITY)
Admission: EM | Admit: 2016-02-29 | Discharge: 2016-03-01 | Disposition: A | Discharge status: Home / Self Care | Payer: Medicare Other | Attending: Emergency Medicine | Admitting: Emergency Medicine

## 2016-02-29 DIAGNOSIS — Z79899 Other long term (current) drug therapy: Secondary | ICD-10-CM | POA: Insufficient documentation

## 2016-02-29 DIAGNOSIS — Z8546 Personal history of malignant neoplasm of prostate: Secondary | ICD-10-CM | POA: Insufficient documentation

## 2016-02-29 DIAGNOSIS — R197 Diarrhea, unspecified: Secondary | ICD-10-CM | POA: Diagnosis not present

## 2016-02-29 DIAGNOSIS — N179 Acute kidney failure, unspecified: Secondary | ICD-10-CM | POA: Insufficient documentation

## 2016-02-29 DIAGNOSIS — R112 Nausea with vomiting, unspecified: Secondary | ICD-10-CM | POA: Diagnosis present

## 2016-02-29 DIAGNOSIS — I10 Essential (primary) hypertension: Secondary | ICD-10-CM | POA: Insufficient documentation

## 2016-02-29 DIAGNOSIS — J449 Chronic obstructive pulmonary disease, unspecified: Secondary | ICD-10-CM | POA: Insufficient documentation

## 2016-02-29 DIAGNOSIS — Z8673 Personal history of transient ischemic attack (TIA), and cerebral infarction without residual deficits: Secondary | ICD-10-CM | POA: Diagnosis not present

## 2016-02-29 DIAGNOSIS — Z87891 Personal history of nicotine dependence: Secondary | ICD-10-CM | POA: Diagnosis not present

## 2016-02-29 LAB — CBC WITH DIFFERENTIAL/PLATELET
Basophils Absolute: 0 10*3/uL (ref 0.0–0.1)
Basophils Relative: 0 %
EOS ABS: 0.1 10*3/uL (ref 0.0–0.7)
Eosinophils Relative: 1 %
HCT: 49.5 % (ref 39.0–52.0)
HEMOGLOBIN: 17.1 g/dL — AB (ref 13.0–17.0)
LYMPHS ABS: 1.9 10*3/uL (ref 0.7–4.0)
LYMPHS PCT: 20 %
MCH: 31.8 pg (ref 26.0–34.0)
MCHC: 34.5 g/dL (ref 30.0–36.0)
MCV: 92 fL (ref 78.0–100.0)
MONOS PCT: 11 %
Monocytes Absolute: 1 10*3/uL (ref 0.1–1.0)
NEUTROS PCT: 68 %
Neutro Abs: 6.4 10*3/uL (ref 1.7–7.7)
Platelets: 170 10*3/uL (ref 150–400)
RBC: 5.38 MIL/uL (ref 4.22–5.81)
RDW: 14.2 % (ref 11.5–15.5)
WBC: 9.5 10*3/uL (ref 4.0–10.5)

## 2016-02-29 LAB — COMPREHENSIVE METABOLIC PANEL
ALK PHOS: 73 U/L (ref 38–126)
ALT: 64 U/L — AB (ref 17–63)
ANION GAP: 10 (ref 5–15)
AST: 63 U/L — ABNORMAL HIGH (ref 15–41)
Albumin: 4.2 g/dL (ref 3.5–5.0)
BILIRUBIN TOTAL: 0.9 mg/dL (ref 0.3–1.2)
BUN: 32 mg/dL — ABNORMAL HIGH (ref 6–20)
CALCIUM: 8.5 mg/dL — AB (ref 8.9–10.3)
CO2: 21 mmol/L — ABNORMAL LOW (ref 22–32)
CREATININE: 1.63 mg/dL — AB (ref 0.61–1.24)
Chloride: 104 mmol/L (ref 101–111)
GFR, EST AFRICAN AMERICAN: 47 mL/min — AB (ref >60)
GFR, EST NON AFRICAN AMERICAN: 40 mL/min — AB (ref >60)
Glucose, Bld: 111 mg/dL — ABNORMAL HIGH (ref 65–99)
Potassium: 3.5 mmol/L (ref 3.5–5.1)
SODIUM: 135 mmol/L (ref 135–145)
TOTAL PROTEIN: 8.1 g/dL (ref 6.5–8.1)

## 2016-02-29 MED ORDER — SODIUM CHLORIDE 0.9 % IV BOLUS (SEPSIS)
1000.0000 mL | Freq: Once | INTRAVENOUS | Status: AC
  Administered 2016-03-01: 1000 mL via INTRAVENOUS

## 2016-02-29 NOTE — ED Provider Notes (Signed)
By signing my name below, I, Rowan Blase, attest that this documentation has been prepared under the direction and in the presence of Jerome, DO . Electronically Signed: Rowan Blase, Scribe. 03/01/2016. 12:03 AM.  TIME SEEN: 12:00 AM  CHIEF COMPLAINT: Emesis  HPI: HPI Comments:  Jonathan Greene is a 75 y.o. male with PMHx of HLD, HTN, GERD, COPD, prostate cancer in 2013 and stroke who presents to the Emergency Department complaining of persistent emesis onset three days ago with last episode yesterday night. Pt reports associated diarrhea last episode 30 minutes ago, nausea, and decreased appetite. He took Imodium AD, Pepto Bismol, and Gas X PTA with temporary relief. He notes sick contact with similar symptoms. He expresses concern for salmonella due to consumption of undercooked chicken. Pt denies any pain, travel outside the country, recent hospitalization, recent antibiotic use, or h/o heart failure.   ROS: See HPI Constitutional: no fever  Eyes: no drainage  ENT: no runny nose   Cardiovascular:  no chest pain  Resp: no SOB  GI: vomiting GU: no dysuria Integumentary: no rash  Allergy: no hives  Musculoskeletal: no leg swelling  Neurological: no slurred speech ROS otherwise negative  PAST MEDICAL HISTORY/PAST SURGICAL HISTORY:  Past Medical History  Diagnosis Date  . Hypercholesterolemia   . Hypertension   . Bradycardia   . Frequent urination   . Urinary urgency   . GERD (gastroesophageal reflux disease)   . Prostate cancer ([place])     RADIATION Coleta IMPLANTS 02-28-12  . Stroke ([place])     2011;ST memory loss, no other deficits.  Marland Kitchen COPD (chronic obstructive pulmonary disease) ([place])   . Sleep apnea     MEDICATIONS:  Prior to Admission medications   Medication Sig Start Date End Date Taking? Authorizing Provider  B Complex Vitamins (VITAMIN B COMPLEX PO) Take 1 tablet by mouth daily.    Historical Provider, MD  Besifloxacin HCl  (BESIVANCE) 0.6 % SUSP Apply 1 drop to eye 3 (three) times daily.    Historical Provider, MD  Bromfenac Sodium (PROLENSA) 0.07 % SOLN Place 1 drop into the left eye daily.    Historical Provider, MD  docusate sodium (COLACE) 100 MG capsule Take 100 mg by mouth 2 (two) times daily.     Historical Provider, MD  fish oil-omega-3 fatty acids 1000 MG capsule Take 1 g by mouth daily.     Historical Provider, MD  Fluticasone Furoate (ARNUITY ELLIPTA) 100 MCG/ACT AEPB Inhale 1 puff into the lungs daily.    Historical Provider, MD  gabapentin (NEURONTIN) 100 MG capsule Take 100 mg by mouth 2 (two) times daily.  06/22/14   Historical Provider, MD  lisinopril (PRINIVIL,ZESTRIL) 10 MG tablet Take 5 mg by mouth at bedtime.    Historical Provider, MD  lisinopril (PRINIVIL,ZESTRIL) 5 MG tablet Take 1 tablet by mouth daily. 10/20/15   Historical Provider, MD  magnesium oxide (MAG-OX) 400 MG tablet Take 400 mg by mouth daily.    Historical Provider, MD  omeprazole (PRILOSEC) 20 MG capsule Take 20 mg by mouth daily.     Historical Provider, MD  rivaroxaban (XARELTO) 20 MG TABS tablet Take 1 tablet (20 mg total) by mouth daily with supper. 03/16/15   Arnoldo Lenis, MD  simvastatin (ZOCOR) 40 MG tablet Take 1 tablet by mouth at bedtime.  12/15/14   Historical Provider, MD  solifenacin (VESICARE) 5 MG tablet Take 5 mg by mouth daily.    Historical Provider,  MD  Tamsulosin HCl (FLOMAX) 0.4 MG CAPS Take 0.4 mg by mouth 2 (two) times daily.     Historical Provider, MD  Umeclidinium-Vilanterol (ANORO ELLIPTA) 62.5-25 MCG/INH AEPB Inhale 1 puff into the lungs daily.    Historical Provider, MD    ALLERGIES:  No Known Allergies  SOCIAL HISTORY:  Social History  Substance Use Topics  . Smoking status: Former Smoker -- 2.00 packs/day for 35 years    Types: Cigarettes    Quit date: 02/21/1991  . Smokeless tobacco: Current User    Types: Chew     Comment: CHEW TOBACCO FOR 20 YRS   . Alcohol Use: 8.4 oz/week    14  Shots of liquor per week     Comment: daily    FAMILY HISTORY: Family History  Problem Relation Age of Onset  . Stroke Mother   . Cancer Neg Hx     EXAM: BP 126/91 mmHg  Pulse 68  Temp(Src) 98.7 F (37.1 C) (Oral)  Resp 20  Ht '6\' 3"'$  (1.905 m)  Wt 215 lb (97.523 kg)  BMI 26.87 kg/m2  SpO2 97% CONSTITUTIONAL: Alert and oriented and responds appropriately to questions. Well-appearing; well-nourished; elderly  HEAD: Normocephalic EYES: Conjunctivae clear, PERRL ENT: normal nose; no rhinorrhea; dry mucous membranes NECK: Supple, no meningismus, no LAD  CARD: RRR; S1 and S2 appreciated; no murmurs, no clicks, no rubs, no gallops RESP: Normal chest excursion without splinting or tachypnea; breath sounds clear and equal bilaterally; no wheezes, no rhonchi, no rales, no hypoxia or respiratory distress, speaking full sentences ABD/GI: Normal bowel sounds; non-distended; soft, non-tender, no rebound, no guarding, no peritoneal signs BACK:  The back appears normal and is non-tender to palpation, there is no CVA tenderness EXT: Normal ROM in all joints; non-tender to palpation; no edema; normal capillary refill; no cyanosis, no calf tenderness or swelling    SKIN: Normal color for age and race; warm; no rash NEURO: Moves all extremities equally, sensation to light touch intact diffusely, cranial nerves II through XII intact PSYCH: The patient's mood and manner are appropriate. Grooming and personal hygiene are appropriate.  MEDICAL DECISION MAKING:  Patient here with nausea, vomiting or diarrhea. Suspect viral illness. Abdominal exam is completely benign. Hemodynamically stable. He does appear dry on exam and labs appear hemoconcentrated with mild bump in his creatinine to 1.63. We'll hydrate with IV fluids. We'll treat symptomatically with Imodium and Zofran. Will obtain to obtain stool cultures if possible. We'll fluid challenge patient. Do not feel he needs emergent imaging of his abdomen at  this time and he agrees with this plan.  ED PROGRESS: Patient reports feeling much better after IV hydration. He has been able to drain. Urine shows no ketones or sign of infection. Have advised him to have his creatinine rechecked by his PCP in one week. Abdominal exam is still benign. Stool cultures are pending but low suspicion for C. difficile or bacterial infection. Discussed with him that if his toe cultures are positive for bacterial infection he will be contacted in place on antibiotics. IV feel he is safe for discharge. Patient and daughter at bedside are comfortable with this plan.     At this time, I do not feel there is any life-threatening condition present. I have reviewed and discussed all results (EKG, imaging, lab, urine as appropriate), exam findings with patient. I have reviewed nursing notes and appropriate previous records.  I feel the patient is safe to be discharged home without further emergent workup.  Discussed usual and customary return precautions. Patient and family (if present) verbalize understanding and are comfortable with this plan.  Patient will follow-up with their primary care provider. If they do not have a primary care provider, information for follow-up has been provided to them. All questions have been answered.   I personally performed the services described in this documentation, which was scribed in my presence. The recorded information has been reviewed and is accurate.    [place], DO 03/01/16 [phone]

## 2016-02-29 NOTE — ED Notes (Signed)
Pt c/o vomiting and diarrhea x 3 days;

## 2016-03-01 LAB — GASTROINTESTINAL PANEL BY PCR, STOOL (REPLACES STOOL CULTURE)
ADENOVIRUS F40/41: NOT DETECTED
ASTROVIRUS: NOT DETECTED
CAMPYLOBACTER SPECIES: NOT DETECTED
CYCLOSPORA CAYETANENSIS: NOT DETECTED
Cryptosporidium: NOT DETECTED
E. coli O157: NOT DETECTED
ENTEROAGGREGATIVE E COLI (EAEC): NOT DETECTED
ENTEROPATHOGENIC E COLI (EPEC): NOT DETECTED
ENTEROTOXIGENIC E COLI (ETEC): NOT DETECTED
Entamoeba histolytica: NOT DETECTED
GIARDIA LAMBLIA: NOT DETECTED
Norovirus GI/GII: DETECTED — AB
PLESIMONAS SHIGELLOIDES: NOT DETECTED
ROTAVIRUS A: NOT DETECTED
SHIGA LIKE TOXIN PRODUCING E COLI (STEC): DETECTED — AB
Salmonella species: NOT DETECTED
Sapovirus (I, II, IV, and V): NOT DETECTED
Shigella/Enteroinvasive E coli (EIEC): NOT DETECTED
VIBRIO SPECIES: NOT DETECTED
Vibrio cholerae: NOT DETECTED
YERSINIA ENTEROCOLITICA: NOT DETECTED

## 2016-03-01 LAB — URINALYSIS, ROUTINE W REFLEX MICROSCOPIC
BILIRUBIN URINE: NEGATIVE
Glucose, UA: NEGATIVE mg/dL
Hgb urine dipstick: NEGATIVE
Ketones, ur: NEGATIVE mg/dL
Leukocytes, UA: NEGATIVE
NITRITE: NEGATIVE
PROTEIN: NEGATIVE mg/dL
SPECIFIC GRAVITY, URINE: 1.025 (ref 1.005–1.030)
pH: 5 (ref 5.0–8.0)

## 2016-03-01 LAB — C DIFFICILE QUICK SCREEN W PCR REFLEX
C Diff antigen: NEGATIVE
C Diff interpretation: NEGATIVE
C Diff toxin: NEGATIVE

## 2016-03-01 MED ORDER — LOPERAMIDE HCL 2 MG PO CAPS
4.0000 mg | ORAL_CAPSULE | Freq: Once | ORAL | Status: AC
  Administered 2016-03-01: 4 mg via ORAL
  Filled 2016-03-01: qty 2

## 2016-03-01 MED ORDER — ONDANSETRON HCL 4 MG/2ML IJ SOLN
4.0000 mg | Freq: Once | INTRAMUSCULAR | Status: AC
  Administered 2016-03-01: 4 mg via INTRAVENOUS
  Filled 2016-03-01: qty 2

## 2016-03-01 MED ORDER — LOPERAMIDE HCL 2 MG PO CAPS: 2.0000 mg | ORAL_CAPSULE | Freq: Four times a day (QID) | ORAL | Status: DC | PRN

## 2016-03-01 MED ORDER — ONDANSETRON 4 MG PO TBDP: 4.0000 mg | ORAL_TABLET | Freq: Three times a day (TID) | ORAL | Status: DC | PRN

## 2016-03-01 MED ORDER — SODIUM CHLORIDE 0.9 % IV BOLUS (SEPSIS)
1000.0000 mL | Freq: Once | INTRAVENOUS | Status: AC
  Administered 2016-03-01: 1000 mL via INTRAVENOUS

## 2016-03-01 NOTE — Discharge Instructions (Signed)
Your kidney function was mildly elevated today.  Please increase your water intake.  If you begin vomiting again and can't keep anything down, have abdominal pain, bloody stools, please return to the ED.  Acute Kidney Injury Acute kidney injury is any condition in which there is sudden (acute) damage to the kidneys. Acute kidney injury was previously known as acute kidney failure or acute renal failure. The kidneys are two organs that lie on either side of the spine between the middle of the back and the front of the abdomen. The kidneys:  Remove wastes and extra water from the blood.   Produce important hormones. These help keep bones strong, regulate blood pressure, and help create red blood cells.   Balance the fluids and chemicals in the blood and tissues. A small amount of kidney damage may not cause problems, but a large amount of damage may make it difficult or impossible for the kidneys to work the way they should. Acute kidney injury may develop into long-lasting (chronic) kidney disease. It may also develop into a life-threatening disease called end-stage kidney disease. Acute kidney injury can get worse very quickly, so it should be treated right away. Early treatment may prevent other kidney diseases from developing. CAUSES   A problem with blood flow to the kidneys. This may be caused by:   Blood loss.   Heart disease.   Severe burns.   Liver disease.  Direct damage to the kidneys. This may be caused by:  Some medicines.   A kidney infection.   Poisoning or consuming toxic substances.   A surgical wound.   A blow to the kidney area.   A problem with urine flow. This may be caused by:   Cancer.   Kidney stones.   An enlarged prostate. SIGNS AND SYMPTOMS   Swelling (edema) of the legs, ankles, or feet.   Tiredness (lethargy).   Nausea or vomiting.   Confusion.   Problems with urination, such as:   Painful or burning feeling during  urination.   Decreased urine production.   Frequent accidents in children who are potty trained.   Bloody urine.   Muscle twitches and cramps.   Shortness of breath.   Seizures.   Chest pain or pressure. Sometimes, no symptoms are present. DIAGNOSIS Acute kidney injury may be detected and diagnosed by tests, including blood, urine, imaging, or kidney biopsy tests.  TREATMENT Treatment of acute kidney injury varies depending on the cause and severity of the kidney damage. In mild cases, no treatment may be needed. The kidneys may heal on their own. If acute kidney injury is more severe, your health care provider will treat the cause of the kidney damage, help the kidneys heal, and prevent complications from occurring. Severe cases may require a procedure to remove toxic wastes from the body (dialysis) or surgery to repair kidney damage. Surgery may involve:   Repair of a torn kidney.   Removal of an obstruction. HOME CARE INSTRUCTIONS  Follow your prescribed diet.  Take medicines only as directed by your health care provider.  Do not take any new medicines (prescription, over-the-counter, or nutritional supplements) unless approved by your health care provider. Many medicines can worsen your kidney damage or may need to have the dose adjusted.   Keep all follow-up visits as directed by your health care provider. This is important.  Observe your condition to make sure you are healing as expected. SEEK IMMEDIATE MEDICAL CARE IF:  You are feeling ill or  have severe pain in the back or side.   Your symptoms return or you have new symptoms.  You have any symptoms of end-stage kidney disease. These include:   Persistent itchiness.   Loss of appetite.   Headaches.   Abnormally dark or light skin.  Numbness in the hands or feet.   Easy bruising.   Frequent hiccups.   Menstruation stops.   You have a fever.  You have increased urine  production.  You have pain or bleeding when urinating. MAKE SURE YOU:   Understand these instructions.  Will watch your condition.  Will get help right away if you are not doing well or get worse.   This information is not intended to replace advice given to you by your health care provider. Make sure you discuss any questions you have with your health care provider.   Document Released: 06/05/2011 Document Revised: 12/11/2014 Document Reviewed: 07/19/2012 Elsevier Interactive Patient Education 2016 [employer].  Diarrhea Diarrhea is frequent loose and watery bowel movements. It can cause you to feel weak and dehydrated. Dehydration can cause you to become tired and thirsty, have a dry mouth, and have decreased urination that often is dark yellow. Diarrhea is a sign of another problem, most often an infection that will not last long. In most cases, diarrhea typically lasts 2-3 days. However, it can last longer if it is a sign of something more serious. It is important to treat your diarrhea as directed by your caregiver to lessen or prevent future episodes of diarrhea. CAUSES  Some common causes include:  Gastrointestinal infections caused by viruses, bacteria, or parasites.  Food poisoning or food allergies.  Certain medicines, such as antibiotics, chemotherapy, and laxatives.  Artificial sweeteners and fructose.  Digestive disorders. HOME CARE INSTRUCTIONS  Ensure adequate fluid intake (hydration): Have 1 cup (8 oz) of fluid for each diarrhea episode. Avoid fluids that contain simple sugars or sports drinks, fruit juices, whole milk products, and sodas. Your urine should be clear or pale yellow if you are drinking enough fluids. Hydrate with an oral rehydration solution that you can purchase at pharmacies, retail stores, and online. You can prepare an oral rehydration solution at home by mixing the following ingredients together:   - tsp table salt.   tsp baking soda.   tsp  salt substitute containing potassium chloride.  1  tablespoons sugar.  1 L (34 oz) of water.  Certain foods and beverages may increase the speed at which food moves through the gastrointestinal (GI) tract. These foods and beverages should be avoided and include:  Caffeinated and alcoholic beverages.  High-fiber foods, such as raw fruits and vegetables, nuts, seeds, and whole grain breads and cereals.  Foods and beverages sweetened with sugar alcohols, such as xylitol, sorbitol, and mannitol.  Some foods may be well tolerated and may help thicken stool including:  Starchy foods, such as rice, toast, pasta, low-sugar cereal, oatmeal, grits, baked potatoes, crackers, and bagels.  Bananas.  Applesauce.  Add probiotic-rich foods to help increase healthy bacteria in the GI tract, such as yogurt and fermented milk products.  Wash your hands well after each diarrhea episode.  Only take over-the-counter or prescription medicines as directed by your caregiver.  Take a warm bath to relieve any burning or pain from frequent diarrhea episodes. SEEK IMMEDIATE MEDICAL CARE IF:   You are unable to keep fluids down.  You have persistent vomiting.  You have blood in your stool, or your stools are black and  tarry.  You do not urinate in 6-8 hours, or there is only a small amount of very dark urine.  You have abdominal pain that increases or localizes.  You have weakness, dizziness, confusion, or light-headedness.  You have a severe headache.  Your diarrhea gets worse or does not get better.  You have a fever or persistent symptoms for more than 2-3 days.  You have a fever and your symptoms suddenly get worse. MAKE SURE YOU:   Understand these instructions.  Will watch your condition.  Will get help right away if you are not doing well or get worse.   This information is not intended to replace advice given to you by your health care provider. Make sure you discuss any questions  you have with your health care provider.   Document Released: 11/10/2002 Document Revised: 12/11/2014 Document Reviewed: 07/28/2012 Elsevier Interactive Patient Education 2016 Else[employer] Inc[employer]  Nausea and Vomiting Nausea is a sick feeling that often comes before throwing up (vomiting). Vomiting is a reflex where stomach contents come out of your mouth. Vomiting can cause severe loss of body fluids (dehydration). Children and elderly adults can become dehydrated quickly, especially if they also have diarrhea. Nausea and vomiting are symptoms of a condition or disease. It is important to find the cause of your symptoms. CAUSES   Direct irritation of the stomach lining. This irritation can result from increased acid production (gastroesophageal reflux disease), infection, food poisoning, taking certain medicines (such as nonsteroidal anti-inflammatory drugs), alcohol use, or tobacco use.  Signals from the brain.These signals could be caused by a headache, heat exposure, an inner ear disturbance, increased pressure in the brain from injury, infection, a tumor, or a concussion, pain, emotional stimulus, or metabolic problems.  An obstruction in the gastrointestinal tract (bowel obstruction).  Illnesses such as diabetes, hepatitis, gallbladder problems, appendicitis, kidney problems, cancer, sepsis, atypical symptoms of a heart attack, or eating disorders.  Medical treatments such as chemotherapy and radiation.  Receiving medicine that makes you sleep (general anesthetic) during surgery. DIAGNOSIS Your caregiver may ask for tests to be done if the problems do not improve after a few days. Tests may also be done if symptoms are severe or if the reason for the nausea and vomiting is not clear. Tests may include:  Urine tests.  Blood tests.  Stool tests.  Cultures (to look for evidence of infection).  X-rays or other imaging studies. Test results can help your caregiver make decisions about  treatment or the need for additional tests. TREATMENT You need to stay well hydrated. Drink frequently but in small amounts.You may wish to drink water, sports drinks, clear broth, or eat frozen ice pops or gelatin dessert to help stay hydrated.When you eat, eating slowly may help prevent nausea.There are also some antinausea medicines that may help prevent nausea. HOME CARE INSTRUCTIONS   Take all medicine as directed by your caregiver.  If you do not have an appetite, do not force yourself to eat. However, you must continue to drink fluids.  If you have an appetite, eat a normal diet unless your caregiver tells you differently.  Eat a variety of complex carbohydrates (rice, wheat, potatoes, bread), lean meats, yogurt, fruits, and vegetables.  Avoid high-fat foods because they are more difficult to digest.  Drink enough water and fluids to keep your urine clear or pale yellow.  If you are dehydrated, ask your caregiver for specific rehydration instructions. Signs of dehydration may include:  Severe thirst.  Dry lips  and mouth.  Dizziness.  Dark urine.  Decreasing urine frequency and amount.  Confusion.  Rapid breathing or pulse. SEEK IMMEDIATE MEDICAL CARE IF:   You have blood or brown flecks (like coffee grounds) in your vomit.  You have black or bloody stools.  You have a severe headache or stiff neck.  You are confused.  You have severe abdominal pain.  You have chest pain or trouble breathing.  You do not urinate at least once every 8 hours.  You develop cold or clammy skin.  You continue to vomit for longer than 24 to 48 hours.  You have a fever. MAKE SURE YOU:   Understand these instructions.  Will watch your condition.  Will get help right away if you are not doing well or get worse.   This information is not intended to replace advice given to you by your health care provider. Make sure you discuss any questions you have with your health care  provider.   Document Released: 11/20/2005 Document Revised: 02/12/2012 Document Reviewed: 04/19/2011 Elsevier Interactive Patient Education [employer].

## 2016-03-01 NOTE — ED Notes (Signed)
Patient verbalizes understanding of discharge instructions, prescription medications, home care and follow up care. Patient out of department at this time. 

## 2016-03-01 NOTE — ED Notes (Signed)
CRITICAL VALUE ALERT  Critical value received:  Positive for norovirus and ecoli stec  Date of notification:  03/01/2016  Time of notification:  4098  Critical value read back:Yes.    Nurse who received alert:  [facility] RN  MD notified (1st page):  Dr. Thurnell Garbe  Time of first page:  1718  MD notified (2nd page):  Time of second page:  Responding MD: Dr. Thurnell Garbe  Time MD responded: 1191- instructed to call pt and make him aware and instruct him to wash hands frequently.  Left message with patient's wife to have pt call back.  Wife verbalized understanding.

## 2016-03-01 NOTE — ED Notes (Signed)
Pt called back and made aware of lab results.  Instructed to wash hands frequently and follow up with pcp or return to er if worsening.

## 2016-05-30 ENCOUNTER — Encounter: Payer: Self-pay | Admitting: Cardiology

## 2016-05-30 ENCOUNTER — Ambulatory Visit (INDEPENDENT_AMBULATORY_CARE_PROVIDER_SITE_OTHER): Payer: Medicare Other | Admitting: Cardiology

## 2016-05-30 VITALS — BP 110/64 | HR 50 | Ht 75.0 in | Wt 213.0 lb

## 2016-05-30 DIAGNOSIS — R001 Bradycardia, unspecified: Secondary | ICD-10-CM

## 2016-05-30 DIAGNOSIS — R0602 Shortness of breath: Secondary | ICD-10-CM | POA: Diagnosis not present

## 2016-05-30 DIAGNOSIS — I4891 Unspecified atrial fibrillation: Secondary | ICD-10-CM

## 2016-05-30 DIAGNOSIS — K921 Melena: Secondary | ICD-10-CM

## 2016-05-30 NOTE — Progress Notes (Signed)
Clinical Summary Jonathan Greene is a 75 y.o.male seen today for follow up of the following medical problems.    1. Afib - previous ER visit 01/28/15 with 1 month history of SOB and lightheadness - EKG during evlauation showed afib, a new diagnosis for the patient. K was 3.8. 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  - started on xarelto in ER, denies any bleeding issues - since last visit completed monitor which showed no significant arrhythmias. Symptoms correlated with sinus brady to 50s and NSR. No afib noted, no tachycarrhtymias noted.   - no recent palpitations - compliant with meds. Can have some occasional blood with BMs, typically associated with constipation. Prior colonscopy with friable distal rectal tissue s/p cauterization, followed by Dr Sydell Axon.    2. Bradycardia - long history. Patient reportedly refused a pacemaker in the past - ongoing for 20+ years per his report   - still with some occasional dizziness mainly with standing which is chronic.    3. SOB - severe emphysematous changes on recent CT scan. Reports previous asbestos exposures.  - tobacco x 33 years - PFTs do not show COPD, but do show decreased DLCO - followed by Dr Luan Pulling  Past Medical History  Diagnosis Date  . Hypercholesterolemia   . Hypertension   . Bradycardia   . Frequent urination   . Urinary urgency   . GERD (gastroesophageal reflux disease)   . Prostate cancer ([place])     RADIATION [place] IMPLANTS 02-28-12  . Stroke ([place])     2011;ST memory loss, no other deficits.  Marland Kitchen COPD (chronic obstructive pulmonary disease) ([place])   . Sleep apnea      No Known Allergies   Current Outpatient Prescriptions  Medication Sig Dispense Refill  . B Complex Vitamins (VITAMIN B COMPLEX PO) Take 1 tablet by mouth daily.    Marland Kitchen bismuth subsalicylate (PEPTO BISMOL) 262 MG/15ML suspension Take 30 mLs  by mouth every 6 (six) hours as needed for indigestion or diarrhea or loose stools.    . docusate sodium (COLACE) 100 MG capsule Take 100 mg by mouth 2 (two) times daily.     . fish oil-omega-3 fatty acids 1000 MG capsule Take 1 g by mouth daily.     . Fluticasone Furoate (ARNUITY ELLIPTA) 100 MCG/ACT AEPB Inhale 1 puff into the lungs daily.    Marland Kitchen lisinopril (PRINIVIL,ZESTRIL) 5 MG tablet Take 1 tablet by mouth daily.    Marland Kitchen loperamide (IMODIUM A-D) 2 MG tablet Take 2 mg by mouth 2 (two) times daily as needed for diarrhea or loose stools.    Marland Kitchen loperamide (IMODIUM) 2 MG capsule Take 1 capsule (2 mg total) by mouth 4 (four) times daily as needed for diarrhea or loose stools. 12 capsule 0  . magnesium oxide (MAG-OX) 400 MG tablet Take 400 mg by mouth daily.    Marland Kitchen omeprazole (PRILOSEC) 20 MG capsule Take 20 mg by mouth daily.     . ondansetron (ZOFRAN ODT) 4 MG disintegrating tablet Take 1 tablet (4 mg total) by mouth every 8 (eight) hours as needed for nausea or vomiting. 20 tablet 0  . rivaroxaban (XARELTO) 20 MG TABS tablet Take 1 tablet (20 mg total) by mouth daily with supper. 90 tablet 4  . simethicone (GAS-X) 80 MG chewable tablet Chew 80 mg by mouth once as needed for flatulence.    Marland Kitchen  simvastatin (ZOCOR) 40 MG tablet Take 1 tablet by mouth at bedtime.     . solifenacin (VESICARE) 5 MG tablet Take 5 mg by mouth daily.    Marland Kitchen Umeclidinium-Vilanterol (ANORO ELLIPTA) 62.5-25 MCG/INH AEPB Inhale 1 puff into the lungs daily.     No current facility-administered medications for this visit.     Past Surgical History  Procedure Laterality Date  . Radioactive seed implant  02/28/2012    Procedure: RADIOACTIVE SEED IMPLANT;  Surgeon: Bernestine Amass, MD;  Location: [facility];  Service: Urology;  Laterality: N/A;  67seeds implanted   . Cystoscopy  02/28/2012    Procedure: CYSTOSCOPY;  Surgeon: Bernestine Amass, MD;  Location: [facility];  Service: Urology;  Laterality: N/A;    no seeds noted in bladder  . Cataract extraction w/phaco Right 11/06/2013    Procedure: CATARACT EXTRACTION PHACO AND INTRAOCULAR LENS PLACEMENT (IOC);  Surgeon: Tonny Jniya Madara, MD;  Location: [facility];  Service: Ophthalmology;  Laterality: Right;  CDE 11.67  . Colonoscopy  10/03/2006    EVO:[id number] rectal polyps cold biopsied/removed, otherwise normal rectum/Sigmoid diverticula.  Remainder of colon mucosa appeared normal  . Colonoscopy N/A 08/19/2014    RMR: Radiation proctitis-status post APC ablation. Colonic diverticulosis.  . Cataract extraction w/phaco Left 11/25/2015    Procedure: CATARACT EXTRACTION PHACO AND INTRAOCULAR LENS PLACEMENT ; CDE:  8.15;  Surgeon: Tonny Caeden Foots, MD;  Location: [facility];  Service: Ophthalmology;  Laterality: Left;     No Known Allergies    Family History  Problem Relation Age of Onset  . Stroke Mother   . Cancer Neg Hx      Social History Jonathan Greene reports that he quit smoking about 25 years ago. His smoking use included Cigarettes. He has a 70 pack-year smoking history. His smokeless tobacco use includes Chew. Jonathan Greene reports that he drinks about 8.4 oz of alcohol per week.   Review of Systems CONSTITUTIONAL: No weight loss, fever, chills, weakness or fatigue.  HEENT: Eyes: No visual loss, blurred vision, double vision or yellow sclerae.No hearing loss, sneezing, congestion, runny nose or sore throat.  SKIN: No rash or itching.  CARDIOVASCULAR: per HPI RESPIRATORY: No shortness of breath, cough or sputum.  GASTROINTESTINAL: No anorexia, nausea, vomiting or diarrhea. No abdominal pain or blood.  GENITOURINARY: No burning on urination, no polyuria NEUROLOGICAL: No headache, dizziness, syncope, paralysis, ataxia, numbness or tingling in the extremities. No change in bowel or bladder control.  MUSCULOSKELETAL: No muscle, back pain, joint pain or stiffness.  LYMPHATICS: No enlarged nodes. No history of splenectomy.  PSYCHIATRIC: No history of  depression or anxiety.  ENDOCRINOLOGIC: No reports of sweating, cold or heat intolerance. No polyuria or polydipsia.  Marland Kitchen   Physical Examination Filed Vitals:   05/30/16 0821  BP: 110/64  Pulse: 50   Filed Vitals:   05/30/16 0821  Height: '6\' 3"'$  (1.905 m)  Weight: 213 lb (96.616 kg)    Gen: resting comfortably, no acute distress HEENT: no scleral icterus, pupils equal round and reactive, no palptable cervical adenopathy,  CV: RRR, no nm/r/g, no jvd Resp: Clear to auscultation bilaterally GI: abdomen is soft, non-tender, non-distended, normal bowel sounds, no hepatosplenomegaly MSK: extremities are warm, no edema.  Skin: warm, no rash Neuro:  no focal deficits Psych: appropriate affect   Diagnostic Studies  02/2015 Echo Study Conclusions  - Left ventricle: The cavity size was normal. Wall thickness was increased in a pattern of mild LVH. Systolic function was  normal. The estimated ejection fraction was in the range of 50% to 55%. Wall motion was normal; there were no regional wall motion abnormalities. Doppler parameters are consistent with abnormal left ventricular relaxation (grade 1 diastolic dysfunction). - Aortic valve: Mildly calcified annulus. Trileaflet; mildly thickened leaflets. There was mild regurgitation. Valve area (VTI): 3.12 cm^2. Valve area (Vmax): 3.15 cm^2. - Aortic root: The visualized portion of the proximal ascending aorta is mildly dilated at 4.0 cm. - Mitral valve: Mildly calcified annulus. Mildly thickened leaflets . - Systemic veins: IVC appears small, suggestive of low RA pressures and hypovolemia. - Technically adequate study.  02/2015 Event monitor Mild sinus brady, no tachyarrhythmias  03/2015 PFTs Decreased DLCO   Assessment and Plan  1. Afib - recent monitor shows NSR and sinus brady, no significant tachycarrhythmias and no severe bradycardia - not requiring AV nodal agents at this time, will continue to avoid  due to his long history of sinus brady. When he was in afib rates were not severely elevated. - continue xarelto, CHADS2Vasc score of 4 (age, HTN, prior stroke). Notes some blood mixed in stools at times, he has not been anemic. Will have him f/u back up with Dr Sydell Axon. Request recent labs from pcp, pending renal function may need to alter xarelto doseing.    2. Bradycardia - hx of sinus bradycardia to 40-50s for over 20 years per his report, confirmed by old EKG review - recent monitor shows no significant symptomatic episodes - we will continue to follow clinically.   3. SOB - no clear cardiac cause. Long hx of tobacco, CT showed severe emphsymatous changes. PFTs no COPD but decreased DLCO. Further workup by Dr Luan Pulling, patient does have history of asbestos exposure   F/u 6 months      Arnoldo Lenis, M.D.

## 2016-05-30 NOTE — Patient Instructions (Signed)
Medication Instructions:  Your physician recommends that you continue on your current medications as directed. Please refer to the Current Medication list given to you today.   Labwork: I will request labs from pcp  Testing/Procedures: none  Follow-Up: Your physician wants you to follow-up in: 6 months .  You will receive a reminder letter in the mail two months in advance. If you don't receive a letter, please call our office to schedule the follow-up appointment.   Any Other Special Instructions Will Be Listed Below (If Applicable).  I have sent in a referral to GI, with Dr. Buford Dresser.    If you need a refill on your cardiac medications before your next appointment, please call your pharmacy.

## 2016-05-31 ENCOUNTER — Encounter: Payer: Self-pay | Admitting: Internal Medicine

## 2016-06-20 ENCOUNTER — Encounter: Payer: Self-pay | Admitting: Gastroenterology

## 2016-06-20 ENCOUNTER — Ambulatory Visit (INDEPENDENT_AMBULATORY_CARE_PROVIDER_SITE_OTHER): Payer: Medicare Other | Admitting: Gastroenterology

## 2016-06-20 VITALS — BP 131/67 | HR 62 | Temp 97.8°F | Ht 74.0 in | Wt 216.2 lb

## 2016-06-20 DIAGNOSIS — K921 Melena: Secondary | ICD-10-CM | POA: Diagnosis not present

## 2016-06-20 NOTE — Patient Instructions (Signed)
1. Avoid constipation and straining. If you continue to have rectal bleeding please let us know, you may require another round of cauterization for the radiation proctitis. Return to the office as needed.

## 2016-06-20 NOTE — Assessment & Plan Note (Signed)
75 year old gentleman who presents for recent episode of hematochezia in the setting of constipation and Xarelto. 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 Patient has subsequently corrected his constipation issues, now on Colace and increased water consumption. No longer straining. No further rectal bleeding in 6 weeks. Suspect benign anorectal source. Cannot rule out recurrent radiation proctitis. Patient prefers to monitor his symptoms. If any recurrent bleeding/persistent bleeding would consider repeat colonoscopy with APC treatment if needed. Otherwise return to the office as needed.

## 2016-06-20 NOTE — Progress Notes (Signed)
Primary Care Physician:  Wende Neighbors, MD  Primary Gastroenterologist:  [place]field Cornea, MD   Chief Complaint  Patient presents with  . Blood In Stools    BRB about 6 weeks ago.    HPI:  Jonathan Greene is a 75 y.o. male here for further evaluation of brbpr at the request of Dr. Carlyle Dolly. Patient has a history of radiation proctitis, underwent APC treatment back in 2015 for rectal bleeding. Since we last saw him he was placed on Xarelto for Afib. Saw Dr. Harl Bowie recently reported several episodes of fresh blood per rectum in the setting of constipation. Patient advised to follow-up with [place]. Notably in March there was no evidence of anemia. Patient states his last episode of rectal bleeding was about 6 weeks ago. He notes he had become constipated but is now managing this with Colace twice daily. He has increased his water consumption. Has a bowel movement most days. No straining. No further rectal bleeding. Denies rectal pain, abdominal pain, upper GI symptoms. He feels well.   Current Outpatient Prescriptions  Medication Sig Dispense Refill  . B Complex Vitamins (VITAMIN B COMPLEX PO) Take 1 tablet by mouth daily.    Marland Kitchen docusate sodium (COLACE) 100 MG capsule Take 250 mg by mouth 2 (two) times daily.     . fish oil-omega-3 fatty acids 1000 MG capsule Take 1 g by mouth daily.     . Fluticasone Furoate (ARNUITY ELLIPTA) 100 MCG/ACT AEPB Inhale 1 puff into the lungs daily.    Marland Kitchen lisinopril (PRINIVIL,ZESTRIL) 5 MG tablet Take 1 tablet by mouth daily.    . magnesium oxide (MAG-OX) 400 MG tablet Take 400 mg by mouth daily.    Marland Kitchen omeprazole (PRILOSEC) 20 MG capsule Take 20 mg by mouth daily.     . rivaroxaban (XARELTO) 20 MG TABS tablet Take 1 tablet (20 mg total) by mouth daily with supper. 90 tablet 4  . simethicone (GAS-X) 80 MG chewable tablet Chew 80 mg by mouth once as needed for flatulence. Reported on 06/20/2016    . simvastatin (ZOCOR) 40 MG tablet Take 1 tablet by mouth at bedtime.     .  solifenacin (VESICARE) 5 MG tablet Take 5 mg by mouth daily.    . tamsulosin (FLOMAX) 0.4 MG CAPS capsule Take 4 mg by mouth daily.    Marland Kitchen Umeclidinium-Vilanterol (ANORO ELLIPTA) 62.5-25 MCG/INH AEPB Inhale 1 puff into the lungs daily.     No current facility-administered medications for this visit.    Allergies as of 06/20/2016  . (No Known Allergies)    Past Medical History  Diagnosis Date  . Hypercholesterolemia   . Hypertension   . Bradycardia   . Frequent urination   . Urinary urgency   . GERD (gastroesophageal reflux disease)   . Prostate cancer ([place])     RADIATION [place] IMPLANTS 02-28-12  . Stroke ([place])     2011;ST memory loss, no other deficits.  Marland Kitchen COPD (chronic obstructive pulmonary disease) ([place])   . Sleep apnea     Past Surgical History  Procedure Laterality Date  . Radioactive seed implant  02/28/2012    Procedure: RADIOACTIVE SEED IMPLANT;  Surgeon: Bernestine Amass, MD;  Location: [facility];  Service: Urology;  Laterality: N/A;  67seeds implanted   . Cystoscopy  02/28/2012    Procedure: CYSTOSCOPY;  Surgeon: Bernestine Amass, MD;  Location: [facility];  Service: Urology;  Laterality: N/A;   no  seeds noted in bladder  . Cataract extraction w/phaco Right 11/06/2013    Procedure: CATARACT EXTRACTION PHACO AND INTRAOCULAR LENS PLACEMENT (IOC);  Surgeon: Tonny Branch, MD;  Location: [facility];  Service: Ophthalmology;  Laterality: Right;  CDE 11.67  . Colonoscopy  10/03/2006    VOJ:[id number] rectal polyps cold biopsied/removed, otherwise normal rectum/Sigmoid diverticula.  Remainder of colon mucosa appeared normal  . Colonoscopy N/A 08/19/2014    RMR: Radiation proctitis-status post APC ablation. Colonic diverticulosis.  . Cataract extraction w/phaco Left 11/25/2015    Procedure: CATARACT EXTRACTION PHACO AND INTRAOCULAR LENS PLACEMENT ; CDE:  8.15;  Surgeon: Tonny Branch, MD;  Location: [facility];  Service: Ophthalmology;   Laterality: Left;    Family History  Problem Relation Age of Onset  . Stroke Mother   . Cancer Neg Hx     Social History   Social History  . Marital Status: Married    Spouse Name: N/A  . Number of Children: N/A  . Years of Education: N/A   Occupational History  . Not on file.   Social History Main Topics  . Smoking status: Former Smoker -- 2.00 packs/day for 35 years    Types: Cigarettes    Quit date: 02/21/1991  . Smokeless tobacco: Current User    Types: Chew     Comment: CHEW TOBACCO FOR 20 YRS   . Alcohol Use: 8.4 oz/week    14 Shots of liquor per week     Comment: daily  . Drug Use: No  . Sexual Activity: Yes    Birth Control/ Protection: None   Other Topics Concern  . Not on file   Social History Narrative      ROS:  General: Negative for anorexia, weight loss, fever, chills, fatigue, weakness. Eyes: Negative for vision changes.  ENT: Negative for hoarseness, difficulty swallowing , nasal congestion. CV: Negative for chest pain, angina, palpitations, dyspnea on exertion, peripheral edema.  Respiratory: Negative for dyspnea at rest, dyspnea on exertion, cough, sputum, wheezing.  GI: See history of present illness. GU:  Negative for dysuria, hematuria, urinary incontinence, urinary frequency, nocturnal urination.  MS: Negative for joint pain, low back pain.  Derm: Negative for rash or itching.  Neuro: Negative for weakness, abnormal sensation, seizure, frequent headaches, memory loss, confusion.  Psych: Negative for anxiety, depression, suicidal ideation, hallucinations.  Endo: Negative for unusual weight change.  Heme: Negative for bruising or bleeding. Allergy: Negative for rash or hives.    Physical Examination:  BP 131/67 mmHg  Pulse 62  Temp(Src) 97.8 F (36.6 C) (Oral)  Ht '6\' 2"'$  (1.88 m)  Wt 216 lb 3.2 oz (98.068 kg)  BMI 27.75 kg/m2   General: Well-nourished, well-developed in no acute distress.  Head: Normocephalic, atraumatic.    Eyes: Conjunctiva pink, no icterus. Mouth: Oropharyngeal mucosa moist and pink , no lesions erythema or exudate. Neck: Supple without thyromegaly, masses, or lymphadenopathy.  Lungs: Clear to auscultation bilaterally.  Heart: Regular rate and rhythm, no murmurs rubs or gallops.  Abdomen: Bowel sounds are normal, nontender, nondistended, no hepatosplenomegaly or masses, no abdominal bruits or    hernia , no rebound or guarding.   Rectal: not performed Extremities: No lower extremity edema. No clubbing or deformities.  Neuro: Alert and oriented x 4 , grossly normal neurologically.  Skin: Warm and dry, no rash or jaundice.   Psych: Alert and cooperative, normal mood and affect.  Labs: Lab Results  Component Value Date   WBC 9.5 02/29/2016   HGB 17.1*  02/29/2016   HCT 49.5 02/29/2016   MCV 92.0 02/29/2016   PLT 170 02/29/2016    Imaging Studies: No results found.

## 2016-06-20 NOTE — Progress Notes (Signed)
cc'ed to pcp °

## 2016-06-23 LAB — PULMONARY FUNCTION TEST

## 2016-06-30 ENCOUNTER — Ambulatory Visit (INDEPENDENT_AMBULATORY_CARE_PROVIDER_SITE_OTHER): Payer: Medicare Other | Admitting: Urology

## 2016-06-30 DIAGNOSIS — R3915 Urgency of urination: Secondary | ICD-10-CM | POA: Diagnosis not present

## 2016-06-30 DIAGNOSIS — N401 Enlarged prostate with lower urinary tract symptoms: Secondary | ICD-10-CM | POA: Diagnosis not present

## 2016-06-30 DIAGNOSIS — Z8546 Personal history of malignant neoplasm of prostate: Secondary | ICD-10-CM | POA: Diagnosis not present

## 2016-07-11 NOTE — Progress Notes (Signed)
Discussed with Dr. Gala Romney who agrees with plan.

## 2016-07-18 ENCOUNTER — Ambulatory Visit: Payer: Medicare Other | Admitting: Internal Medicine

## 2016-08-03 ENCOUNTER — Encounter: Payer: Self-pay | Admitting: Internal Medicine

## 2016-11-14 ENCOUNTER — Encounter: Payer: Self-pay | Admitting: Cardiology

## 2016-11-28 ENCOUNTER — Ambulatory Visit: Payer: Medicare Other | Admitting: Cardiology

## 2016-12-08 ENCOUNTER — Ambulatory Visit: Payer: Medicare Other | Admitting: Cardiology

## 2016-12-11 ENCOUNTER — Encounter: Payer: Self-pay | Admitting: Physician Assistant

## 2016-12-11 ENCOUNTER — Ambulatory Visit (INDEPENDENT_AMBULATORY_CARE_PROVIDER_SITE_OTHER): Payer: Medicare Other | Admitting: Physician Assistant

## 2016-12-11 VITALS — BP 118/72 | HR 71 | Ht 75.0 in | Wt 222.0 lb

## 2016-12-11 DIAGNOSIS — R001 Bradycardia, unspecified: Secondary | ICD-10-CM

## 2016-12-11 DIAGNOSIS — I4891 Unspecified atrial fibrillation: Secondary | ICD-10-CM

## 2016-12-11 DIAGNOSIS — I10 Essential (primary) hypertension: Secondary | ICD-10-CM | POA: Insufficient documentation

## 2016-12-11 DIAGNOSIS — J439 Emphysema, unspecified: Secondary | ICD-10-CM

## 2016-12-11 NOTE — Patient Instructions (Signed)

## 2016-12-11 NOTE — Progress Notes (Signed)
Cardiology Office Note    Date:  12/11/2016   ID:  Jonathan Greene, DOB 24-Jul-1941, MRN [id number]  PCP:  Wende Neighbors, MD  Cardiologist: Dr. Harl Bowie   Chief Complaint  Patient presents with  . Follow-up    History of Present Illness:  Jonathan Greene is a 76 y.o. male with atrial fibrillation documented in the emergency room 01/2015 started on Xarelto for CHADS2Vasc=4(age, HTN, prior CVA). Follow-up monitor showed no significant arrhythmias. 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 Has occasional dizziness mainly with standing which is chronic. 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 History of CVA in 2011.  Last saw Dr. Harl Bowie 05/2016 and was having some bright red blood per rectum in the setting of constipation and Xarelto so he was referred to GI. Once he corrected his constipation the bleeding stopped.  Patient comes in today for routine follow-up. He has been sick with either bronchitis or pneumonia for the past week. He was coughing up blood-tinged sputum and was started on antibiotic by his primary care. This resolved. He feels so much better. No longer coughing. Continues to have occasional dizziness with standing that is unchanged. It seems his breathing has worsened with exertion. 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 Denies chest pain or palpitations.    Past Medical History:  Diagnosis Date  . A-fib ([place])   . Bradycardia   . COPD (chronic obstructive pulmonary disease) ([place])   . Frequent urination   . GERD (gastroesophageal reflux disease)   . Hypercholesterolemia   . Hypertension   . Prostate cancer ([place])    RADIATION [place] IMPLANTS 02-28-12  . Sleep apnea   . Stroke ([place])    2011;ST memory loss, no other deficits.  . Urinary urgency     Past Surgical History:  Procedure Laterality Date  . CATARACT EXTRACTION W/PHACO Right  11/06/2013   Procedure: CATARACT EXTRACTION PHACO AND INTRAOCULAR LENS PLACEMENT ([place]);  Surgeon: Tonny Branch, MD;  Location: [facility];  Service: Ophthalmology;  Laterality: Right;  CDE 11.67  . CATARACT EXTRACTION W/PHACO Left 11/25/2015   Procedure: CATARACT EXTRACTION PHACO AND INTRAOCULAR LENS PLACEMENT ; CDE:  8.15;  Surgeon: Tonny Branch, MD;  Location: [facility];  Service: Ophthalmology;  Laterality: Left;  . COLONOSCOPY  10/03/2006   [id number] rectal polyps cold biopsied/removed, otherwise normal rectum/Sigmoid diverticula.  Remainder of colon mucosa appeared normal  . COLONOSCOPY N/A 08/19/2014   RMR: Radiation proctitis-status post APC ablation. Colonic diverticulosis.  . CYSTOSCOPY  02/28/2012   Procedure: CYSTOSCOPY;  Surgeon: Bernestine Amass, MD;  Location: [facility];  Service: Urology;  Laterality: N/A;   no seeds noted in bladder  . RADIOACTIVE SEED IMPLANT  02/28/2012   Procedure: RADIOACTIVE SEED IMPLANT;  Surgeon: Bernestine Amass, MD;  Location: [facility];  Service: Urology;  Laterality: N/A;  67seeds implanted     Current Medications: Outpatient Medications Prior to Visit  Medication Sig Dispense Refill  . B Complex Vitamins (VITAMIN B COMPLEX PO) Take 1 tablet by mouth daily.    Marland Kitchen docusate sodium (COLACE) 100 MG capsule Take 250 mg by mouth 2 (two) times daily.     . fish oil-omega-3 fatty acids 1000 MG capsule Take 1 g by mouth daily.     . Fluticasone Furoate (ARNUITY ELLIPTA) 100 MCG/ACT AEPB Inhale 1 puff into the lungs daily.    Marland Kitchen  lisinopril (PRINIVIL,ZESTRIL) 5 MG tablet Take 1 tablet by mouth daily.    . magnesium oxide (MAG-OX) 400 MG tablet Take 400 mg by mouth daily.    Marland Kitchen omeprazole (PRILOSEC) 20 MG capsule Take 20 mg by mouth daily.     . rivaroxaban (XARELTO) 20 MG TABS tablet Take 1 tablet (20 mg total) by mouth daily with supper. 90 tablet 4  . simethicone (GAS-X) 80 MG chewable tablet Chew 80 mg by mouth once as needed for  flatulence. Reported on 06/20/2016    . simvastatin (ZOCOR) 40 MG tablet Take 1 tablet by mouth at bedtime.     . solifenacin (VESICARE) 5 MG tablet Take 5 mg by mouth daily.    . tamsulosin (FLOMAX) 0.4 MG CAPS capsule Take 4 mg by mouth daily.    Marland Kitchen Umeclidinium-Vilanterol (ANORO ELLIPTA) 62.5-25 MCG/INH AEPB Inhale 1 puff into the lungs daily.     No facility-administered medications prior to visit.      Allergies:   Patient has no known allergies.   Social History   Social History  . Marital status: Married    Spouse name: N/A  . Number of children: N/A  . Years of education: N/A   Social History Main Topics  . Smoking status: Former Smoker    Packs/day: 2.00    Years: 35.00    Types: Cigarettes    Quit date: 02/21/1991  . Smokeless tobacco: Former Systems developer    Types: [place] date: 03/11/2016     Comment: CHEW TOBACCO FOR 20 YRS   . Alcohol use 8.4 oz/week    14 Shots of liquor per week     Comment: daily  . Drug use: No  . Sexual activity: Yes    Birth control/ protection: None   Other Topics Concern  . None   Social History Narrative  . None     Family History:  The patient's   family history includes Stroke in his mother.   ROS:   Please see the history of present illness.    Review of Systems  Constitution: Negative.  HENT: Positive for hearing loss.   Cardiovascular: Positive for dyspnea on exertion.  Respiratory: Positive for cough.   Endocrine: Negative.   Hematologic/Lymphatic: Bruises/bleeds easily.  Musculoskeletal: Negative.   Gastrointestinal: Negative.   Genitourinary: Negative.   Neurological: Negative.    All other systems reviewed and are negative.   PHYSICAL EXAM:   VS:  BP 118/72   Pulse 71   Ht '6\' 3"'$  (1.905 m)   Wt 222 lb (100.7 kg)   SpO2 93%   BMI 27.75 kg/m   Physical Exam  GEN: Well nourished, well developed, in no acute distress  Neck: no JVD, carotid bruits, or masses Cardiac:RRR; no murmurs, rubs, or gallops    Respiratory:  clear to auscultation bilaterally, normal work of breathing GI: soft, nontender, nondistended, + BS Ext: without cyanosis, clubbing, or edema, Good distal pulses bilaterally MS: no deformity or atrophy  Skin: warm and dry, no rash Psych: euthymic mood, full affect  Wt Readings from Last 3 Encounters:  12/11/16 222 lb (100.7 kg)  06/20/16 216 lb 3.2 oz (98.1 kg)  05/30/16 213 lb (96.6 kg)      Studies/Labs Reviewed:   EKG:  EKG is  ordered today.  The ekg ordered today demonstrates Normal sinus rhythm at 73 bpm  Recent Labs: 02/29/2016: ALT 64; BUN 32; Creatinine, Ser 1.63; Hemoglobin 17.1; Platelets 170; Potassium 3.5; Sodium 135  Lipid Panel No results found for: CHOL, TRIG, HDL, CHOLHDL, VLDL, LDLCALC, LDLDIRECT  Additional studies/ records that were reviewed today include:    02/2015 Echo Study Conclusions  - Left ventricle: The cavity size was normal. Wall thickness was   increased in a pattern of mild LVH. Systolic function was normal.   The estimated ejection fraction was in the range of 50% to 55%.   Wall motion was normal; there were no regional wall motion   abnormalities. Doppler parameters are consistent with abnormal   left ventricular relaxation (grade 1 diastolic dysfunction). - Aortic valve: Mildly calcified annulus. Trileaflet; mildly   thickened leaflets. There was mild regurgitation. Valve area   (VTI): 3.12 cm^2. Valve area (Vmax): 3.15 cm^2. - Aortic root: The visualized portion of the proximal ascending   aorta is mildly dilated at 4.0 cm. - Mitral valve: Mildly calcified annulus. Mildly thickened leaflets   . - Systemic veins: IVC appears small, suggestive of low RA pressures   and hypovolemia. - Technically adequate study.  02/2015 Event monitor Mild sinus brady, no tachyarrhythmias   03/2015 PFTs Decreased DLCO     ASSESSMENT:    1. Atrial fibrillation, unspecified type ([place])   2. Essential hypertension   3. Bradycardia    4. Pulmonary emphysema, unspecified emphysema type ([place])      PLAN:  In order of problems listed above:  PAF on Xarelto maintaining normal sinus rhythm. Was coughing up blood tinged sputum but resolved with antibiotics. Follow-up with Dr. branch in 6 months  Essential hypertension controlled  Bradycardia stable, chronic  Pulmonary emphysema followed by Dr. Luan Pulling and has been seen by specialist in [place] because of an impending lawsuit. Chronic dyspnea on exertion    Medication Adjustments/Labs and Tests Ordered: Current medicines are reviewed at length with the patient today.  Concerns regarding medicines are outlined above.  Medication changes, Labs and Tests ordered today are listed in the Patient Instructions below. Patient Instructions  Medication Instructions:  Your physician recommends that you continue on your current medications as directed. Please refer to the Current Medication list given to you today.   Labwork: NONE  Testing/Procedures: NONE  Follow-Up: Your physician wants you to follow-up in: 6 MONTHS.  You will receive a reminder letter in the mail two months in advance. If you don't receive a letter, please call our office to schedule the follow-up appointment.   Any Other Special Instructions Will Be Listed Below (If Applicable).     If you need a refill on your cardiac medications before your next appointment, please call your pharmacy.      Sumner Boast, PA-C  12/11/2016 11:06 AM    [facility] [place], [place], [place]  [address] Phone: [phone]; Fax: [phone]

## 2017-01-20 ENCOUNTER — Emergency Department (HOSPITAL_COMMUNITY)
Admission: EM | Admit: 2017-01-20 | Discharge: 2017-01-20 | Disposition: A | Discharge status: Home / Self Care | Payer: Medicare Other | Attending: Emergency Medicine | Admitting: Emergency Medicine

## 2017-01-20 ENCOUNTER — Encounter (HOSPITAL_COMMUNITY): Payer: Self-pay | Admitting: Emergency Medicine

## 2017-01-20 ENCOUNTER — Emergency Department (HOSPITAL_COMMUNITY): Payer: Medicare Other

## 2017-01-20 DIAGNOSIS — Z8546 Personal history of malignant neoplasm of prostate: Secondary | ICD-10-CM | POA: Diagnosis not present

## 2017-01-20 DIAGNOSIS — Z87891 Personal history of nicotine dependence: Secondary | ICD-10-CM | POA: Insufficient documentation

## 2017-01-20 DIAGNOSIS — I10 Essential (primary) hypertension: Secondary | ICD-10-CM | POA: Diagnosis not present

## 2017-01-20 DIAGNOSIS — J449 Chronic obstructive pulmonary disease, unspecified: Secondary | ICD-10-CM | POA: Insufficient documentation

## 2017-01-20 DIAGNOSIS — N23 Unspecified renal colic: Secondary | ICD-10-CM | POA: Insufficient documentation

## 2017-01-20 DIAGNOSIS — R1030 Lower abdominal pain, unspecified: Secondary | ICD-10-CM | POA: Diagnosis present

## 2017-01-20 LAB — URINALYSIS, ROUTINE W REFLEX MICROSCOPIC
BILIRUBIN URINE: NEGATIVE
Glucose, UA: NEGATIVE mg/dL
Hgb urine dipstick: NEGATIVE
Ketones, ur: 20 mg/dL — AB
LEUKOCYTES UA: NEGATIVE
NITRITE: NEGATIVE
Protein, ur: NEGATIVE mg/dL
SPECIFIC GRAVITY, URINE: 1.025 (ref 1.005–1.030)
pH: 5 (ref 5.0–8.0)

## 2017-01-20 LAB — COMPREHENSIVE METABOLIC PANEL
ALK PHOS: 69 U/L (ref 38–126)
ALT: 42 U/L (ref 17–63)
ANION GAP: 8 (ref 5–15)
AST: 55 U/L — ABNORMAL HIGH (ref 15–41)
Albumin: 4.2 g/dL (ref 3.5–5.0)
BILIRUBIN TOTAL: 0.9 mg/dL (ref 0.3–1.2)
BUN: 18 mg/dL (ref 6–20)
CALCIUM: 9.6 mg/dL (ref 8.9–10.3)
CO2: 28 mmol/L (ref 22–32)
Chloride: 103 mmol/L (ref 101–111)
Creatinine, Ser: 1.25 mg/dL — ABNORMAL HIGH (ref 0.61–1.24)
GFR calc Af Amer: >60 mL/min (ref >60)
GFR calc non Af Amer: 55 mL/min — ABNORMAL LOW (ref >60)
Glucose, Bld: 154 mg/dL — ABNORMAL HIGH (ref 65–99)
Potassium: 4.4 mmol/L (ref 3.5–5.1)
Sodium: 139 mmol/L (ref 135–145)
TOTAL PROTEIN: 7.5 g/dL (ref 6.5–8.1)

## 2017-01-20 LAB — CBC
HCT: 45.7 % (ref 39.0–52.0)
HEMOGLOBIN: 15.2 g/dL (ref 13.0–17.0)
MCH: 31.7 pg (ref 26.0–34.0)
MCHC: 33.3 g/dL (ref 30.0–36.0)
MCV: 95.2 fL (ref 78.0–100.0)
Platelets: 165 10*3/uL (ref 150–400)
RBC: 4.8 MIL/uL (ref 4.22–5.81)
RDW: 13.7 % (ref 11.5–15.5)
WBC: 11.6 10*3/uL — AB (ref 4.0–10.5)

## 2017-01-20 LAB — LIPASE, BLOOD: Lipase: 14 U/L (ref 11–51)

## 2017-01-20 NOTE — Discharge Instructions (Signed)
Take your usual prescriptions as previously directed.  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 Follow-up CT or MRI without and with contrast in 6 and 12 months recommended, then for 5 years."  Call your regular medical doctor on Monday to schedule a follow up appointment within the next week to follow up this finding.  Return to the Emergency Department immediately sooner if worsening.

## 2017-01-20 NOTE — ED Provider Notes (Signed)
[facility] DEPT Provider Note   CSN: [id number] Arrival date & time: 01/20/17  1422     History   Chief Complaint Chief Complaint  Patient presents with  . Abdominal Pain    HPI Jonathan Greene is a 76 y.o. male.  HPI  Pt was seen at 1700.  Per pt, c/o gradual onset and resolution of constant lower abd "pain" that began this morning. Pt states he had N/V and "felt like I needed to have a BM even after I had a BM." Has been associated with hematuria today, and LBP for the past several days (none today).  Describes the abd pain as "aching."  States the pain completely resolved while he was in the waiting room of the ED. Denies diarrhea, no fevers, no back pain, no rash, no CP/SOB, no black or blood in stools or emesis, no testicular pain/swelling.      Past Medical History:  Diagnosis Date  . A-fib ([place])   . Bradycardia   . COPD (chronic obstructive pulmonary disease) ([place])   . Frequent urination   . GERD (gastroesophageal reflux disease)   . Hypercholesterolemia   . Hypertension   . Prostate cancer ([place])    RADIATION [place] IMPLANTS 02-28-12  . Sleep apnea   . Stroke ([place])    2011;ST memory loss, no other deficits.  . Urinary urgency     Patient Active Problem List   Diagnosis Date Noted  . Essential hypertension 12/11/2016  . Bradycardia 12/11/2016  . Emphysema of lung ([place]) 12/11/2016  . A-fib ([place]) 02/11/2015  . Hematochezia 08/04/2014  . Prostate cancer ([place]) 12/07/2011    Past Surgical History:  Procedure Laterality Date  . CATARACT EXTRACTION W/PHACO Right 11/06/2013   Procedure: CATARACT EXTRACTION PHACO AND INTRAOCULAR LENS PLACEMENT ([place]);  Surgeon: Tonny Branch, MD;  Location: [facility];  Service: Ophthalmology;  Laterality: Right;  CDE 11.67  . CATARACT EXTRACTION W/PHACO Left 11/25/2015   Procedure: CATARACT EXTRACTION PHACO AND INTRAOCULAR LENS PLACEMENT ; CDE:  8.15;  Surgeon: Tonny Branch, MD;  Location: [facility];  Service:  Ophthalmology;  Laterality: Left;  . COLONOSCOPY  10/03/2006   [id number] rectal polyps cold biopsied/removed, otherwise normal rectum/Sigmoid diverticula.  Remainder of colon mucosa appeared normal  . COLONOSCOPY N/A 08/19/2014   RMR: Radiation proctitis-status post APC ablation. Colonic diverticulosis.  . CYSTOSCOPY  02/28/2012   Procedure: CYSTOSCOPY;  Surgeon: Bernestine Amass, MD;  Location[facility];  Service: Urology;  Laterality: N/A;   no seeds noted in bladder  . RADIOACTIVE SEED IMPLANT  02/28/2012   Procedure: RADIOACTIVE SEED IMPLANT;  Surgeon: Bernestine Amass, MD;  Location: [facility];  Service: Urology;  Laterality: N/A;  67seeds implanted        Home Medications    Prior to Admission medications   Medication Sig Start Date End Date Taking? Authorizing Provider  B Complex Vitamins (VITAMIN B COMPLEX PO) Take 1 tablet by mouth daily.   Yes Historical Provider, MD  docusate sodium (COLACE) 250 MG capsule Take 250 mg by mouth 2 (two) times daily.   Yes Historical Provider, MD  escitalopram (LEXAPRO) 10 MG tablet Take 10 mg by mouth daily.   Yes Historical Provider, MD  Fluticasone Furoate (ARNUITY ELLIPTA) 100 MCG/ACT AEPB Inhale 1 puff into the lungs daily.   Yes Historical Provider, MD  lisinopril (PRINIVIL,ZESTRIL) 5 MG tablet Take 1 tablet by mouth every evening.  10/20/15  Yes Historical Provider, MD  magnesium oxide (MAG-OX) 400 MG tablet Take 400 mg by mouth daily.   Yes Historical Provider, MD  omeprazole (PRILOSEC) 20 MG capsule Take 20 mg by mouth daily.    Yes Historical Provider, MD  rivaroxaban (XARELTO) 20 MG TABS tablet Take 1 tablet (20 mg total) by mouth daily with supper. 03/16/15  Yes Arnoldo Lenis, MD  simethicone (GAS-X) 80 MG chewable tablet Chew 80 mg by mouth once as needed for flatulence. Reported on 06/20/2016   Yes Historical Provider, MD  simvastatin (ZOCOR) 40 MG tablet Take 1 tablet by mouth at bedtime.  12/15/14   Yes Historical Provider, MD  solifenacin (VESICARE) 5 MG tablet Take 5 mg by mouth at bedtime.    Yes Historical Provider, MD  Umeclidinium-Vilanterol (ANORO ELLIPTA) 62.5-25 MCG/INH AEPB Inhale 1 puff into the lungs daily.   Yes Historical Provider, MD    Family History Family History  Problem Relation Age of Onset  . Stroke Mother   . Cancer Neg Hx     Social History Social History  Substance Use Topics  . Smoking status: Former Smoker    Packs/day: 2.00    Years: 35.00    Types: Cigarettes    Quit date: 02/21/1991  . Smokeless tobacco: Former Systems developer    Types: [place] date: 03/11/2016     Comment: CHEW TOBACCO FOR 20 YRS   . Alcohol use 8.4 oz/week    14 Shots of liquor per week     Comment: daily     Allergies   Patient has no known allergies.   Review of Systems Review of Systems ROS: Statement: All systems negative except as marked or noted in the HPI; Constitutional: Negative for fever and chills. ; ; Eyes: Negative for eye pain, redness and discharge. ; ; ENMT: Negative for ear pain, hoarseness, nasal congestion, sinus pressure and sore throat. ; ; Cardiovascular: Negative for chest pain, palpitations, diaphoresis, dyspnea and peripheral edema. ; ; Respiratory: Negative for cough, wheezing and stridor. ; ; Gastrointestinal: +N/V, abd pain. Negative for diarrhea, blood in stool, hematemesis, jaundice and rectal bleeding. . ; ; Genitourinary: Negative for dysuria, flank pain and +hematuria. ; ; Genital:  No penile drainage or rash, no testicular pain or swelling, no scrotal rash or swelling. ;; Musculoskeletal: +LBP. Negative for neck pain. Negative for swelling and trauma.; ; Skin: Negative for pruritus, rash, abrasions, blisters, bruising and skin lesion.; ; Neuro: Negative for headache, lightheadedness and neck stiffness. Negative for weakness, altered level of consciousness, altered mental status, extremity weakness, paresthesias, involuntary movement, seizure and  syncope.       Physical Exam Updated Vital Signs BP 149/83   Pulse 64   Temp 97.6 F (36.4 C) (Oral)   Resp 16   Ht '6\' 2"'$  (1.88 m)   Wt 227 lb (103 kg)   SpO2 92%   BMI 29.15 kg/m   Physical Exam 1705: Physical examination:  Nursing notes reviewed; Vital signs and O2 SAT reviewed;  Constitutional: Well developed, Well nourished, Well hydrated, In no acute distress; Head:  Normocephalic, atraumatic; Eyes: EOMI, PERRL, No scleral icterus; ENMT: Mouth and pharynx normal, Mucous membranes moist; Neck: Supple, Full range of motion, No lymphadenopathy; Cardiovascular: Regular rate and rhythm, No gallop; Respiratory: Breath sounds clear & equal bilaterally, No wheezes.  Speaking full sentences with ease, Normal respiratory effort/excursion; Chest: Nontender, Movement normal; Abdomen: Soft, Nontender, Nondistended, Normal bowel sounds; Genitourinary: No CVA tenderness; Spine:  No midline CS, TS, LS tenderness.;;  Extremities: Pulses normal, No tenderness, No edema, No calf edema or asymmetry.; Neuro: AA&Ox3, Major CN grossly intact.  Speech clear. No gross focal motor or sensory deficits in extremities. Climbs on and off stretcher easily by himself. Gait steady.; Skin: Color normal, Warm, Dry.   ED Treatments / Results  Labs (all labs ordered are listed, but only abnormal results are displayed)   EKG  EKG Interpretation None       Radiology   Procedures Procedures (including critical care time)  Medications Ordered in ED Medications - No data to display   Initial Impression / Assessment and Plan / ED Course  I have reviewed the triage vital signs and the nursing notes.  Pertinent labs & imaging results that were available during my care of the patient were reviewed by me and considered in my medical decision making (see chart for details).  MDM Reviewed: previous chart, nursing note and vitals Reviewed previous: labs Interpretation: labs and CT scan    Results for  orders placed or performed during the hospital encounter of 01/20/17  Lipase, blood  Result Value Ref Range   Lipase 14 11 - 51 U/L  Comprehensive metabolic panel  Result Value Ref Range   Sodium 139 135 - 145 mmol/L   Potassium 4.4 3.5 - 5.1 mmol/L   Chloride 103 101 - 111 mmol/L   CO2 28 22 - 32 mmol/L   Glucose, Bld 154 (H) 65 - 99 mg/dL   BUN 18 6 - 20 mg/dL   Creatinine, Ser 1.25 (H) 0.61 - 1.24 mg/dL   Calcium 9.6 8.9 - 10.3 mg/dL   Total Protein 7.5 6.5 - 8.1 g/dL   Albumin 4.2 3.5 - 5.0 g/dL   AST 55 (H) 15 - 41 U/L   ALT 42 17 - 63 U/L   Alkaline Phosphatase 69 38 - 126 U/L   Total Bilirubin 0.9 0.3 - 1.2 mg/dL   GFR calc non Af Amer 55 (L) >60 mL/min   GFR calc Af Amer >60 >60 mL/min   Anion gap 8 5 - 15  CBC  Result Value Ref Range   WBC 11.6 (H) 4.0 - 10.5 K/uL   RBC 4.80 4.22 - 5.81 MIL/uL   Hemoglobin 15.2 13.0 - 17.0 g/dL   HCT 45.7 39.0 - 52.0 %   MCV 95.2 78.0 - 100.0 fL   MCH 31.7 26.0 - 34.0 pg   MCHC 33.3 30.0 - 36.0 g/dL   RDW 13.7 11.5 - 15.5 %   Platelets 165 150 - 400 K/uL  Urinalysis, Routine w reflex microscopic  Result Value Ref Range   Color, Urine YELLOW YELLOW   APPearance CLEAR CLEAR   Specific Gravity, Urine 1.025 1.005 - 1.030   pH 5.0 5.0 - 8.0   Glucose, UA NEGATIVE NEGATIVE mg/dL   Hgb urine dipstick NEGATIVE NEGATIVE   Bilirubin Urine NEGATIVE NEGATIVE   Ketones, ur 20 (A) NEGATIVE mg/dL   Protein, ur NEGATIVE NEGATIVE mg/dL   Nitrite NEGATIVE NEGATIVE   Leukocytes, UA NEGATIVE NEGATIVE   Ct Renal Stone Study Result Date: 01/20/2017 CLINICAL DATA:  Abdominal pain starting this morning with nausea and vomiting. Low back pain and hematuria. EXAM: CT ABDOMEN AND PELVIS WITHOUT CONTRAST TECHNIQUE: Multidetector CT imaging of the abdomen and pelvis was performed following the standard protocol without IV contrast. COMPARISON:  Chest CT 06/09/2015 and 01/28/2015 FINDINGS: Lower chest: Top normal size cardiac chambers. Coronary  arteriosclerosis. No pericardial effusion. Minimal interstitial fibrosis in the periphery of both  lower lobes. No effusion. Hepatobiliary: Tiny granuloma in the right hepatic lobe. No biliary dilatation. Gallbladder is contracted without stones. No space-occupying mass identified on this unenhanced study. Pancreas: No peripancreatic inflammation, ductal dilatation or mass. Spleen: No splenomegaly or mass. Adrenals/Urinary Tract: Normal bilateral adrenal glands. There is perinephric fat stranding bilaterally without obstructive uropathy. A complex cyst is noted off the mid to lower pole the right kidney laterally with peripheral coarse calcifications the largest measuring between 5 and 7 mm along its inferior margin. This lesion measures approximately 4.4 x 3.7 x 4.1 cm. No urolithiasis. Slight fullness of the right ureter without source of obstruction noted. The bladder is free of stones. Stomach/Bowel: Normal appendix. Contracted stomach. Normal small bowel rotation without obstruction or inflammation. Minimal colonic diverticulosis along the distal descending and proximal sigmoid colon without inflammation. Vascular/Lymphatic: Aortoiliac and branch vessel atherosclerosis without aneurysm. No lymphadenopathy. Reproductive: Radiation implant seeds are noted within the normal size prostate. Other: Small fat containing umbilical hernia. No ascites or free air. Musculoskeletal: Lower lumbar facet arthropathy. No lytic or blastic disease. No acute osseous abnormality. There is degenerative disc disease at L4-5 with broad-based concentric disc bulging touching upon the thecal sac. Mild lower lumbar facet arthropathy bilaterally. IMPRESSION: 1. Mild perinephric fat stranding without obstructive uropathy. Mild fullness of the right ureter may represent a recently passed stone. Urinary tract infection should be excluded for this appearance. 2. 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 Follow-up CT or MRI without and with contrast in 6 and 12 months recommended, then for 5 years. 3. No acute bowel inflammation or obstruction. Minimal diverticulosis along the distal descending and proximal sigmoid colon. 4. Coronary arteriosclerosis and aortic atherosclerosis. 5. L4-5 disc bulge. 6. Radiation seeds within the normal size prostate. Electronically Signed   By: Ashley Royalty M.D.   On: 01/20/2017 18:15    1840:  Pt continues to feel "better" and wants to go home now. Workup as above. Pt now endorses he has had these same symptoms "on and off over the years" but never was medically evaluated for them; encouraged to f/u with PMD. Dx and testing d/w pt and family.  Questions answered.  Verb understanding, agreeable to d/c home with outpt f/u.    Final Clinical Impressions(s) / ED Diagnoses   Final diagnoses:  None    New Prescriptions New Prescriptions   No medications on file     Francine Graven, DO 01/24/17 1615

## 2017-01-20 NOTE — ED Triage Notes (Signed)
Patient c/o abd pain that started this morning. Denies any diarrhea or fevers. Patient does reports nausea and vomiting. Per patient emesis yellow in color.

## 2017-04-03 ENCOUNTER — Encounter: Payer: Self-pay | Admitting: Cardiology

## 2017-04-03 ENCOUNTER — Ambulatory Visit (INDEPENDENT_AMBULATORY_CARE_PROVIDER_SITE_OTHER): Payer: Medicare Other | Admitting: Cardiology

## 2017-04-03 VITALS — BP 116/66 | HR 76 | Ht 74.0 in | Wt 218.0 lb

## 2017-04-03 DIAGNOSIS — R5383 Other fatigue: Secondary | ICD-10-CM | POA: Diagnosis not present

## 2017-04-03 DIAGNOSIS — R001 Bradycardia, unspecified: Secondary | ICD-10-CM

## 2017-04-03 DIAGNOSIS — I480 Paroxysmal atrial fibrillation: Secondary | ICD-10-CM

## 2017-04-03 NOTE — Patient Instructions (Signed)
Medication Instructions:  Your physician recommends that you continue on your current medications as directed. Please refer to the Current Medication list given to you today.   Labwork: none  Testing/Procedures: Your physician has recommended that you wear a holter monitor. Holter monitors are medical devices that record the heart's electrical activity. Doctors most often use these monitors to diagnose arrhythmias. Arrhythmias are problems with the speed or rhythm of the heartbeat. The monitor is a small, portable device. You can wear one while you do your normal daily activities. This is usually used to diagnose what is causing palpitations/syncope (passing out).    Follow-Up: Your physician recommends that you schedule a follow-up appointment in: to be determined based on holter monitor    Any Other Special Instructions Will Be Listed Below (If Applicable).     If you need a refill on your cardiac medications before your next appointment, please call your pharmacy.

## 2017-04-03 NOTE — Progress Notes (Signed)
Clinical Summary Jonathan Greene is a 76 y.o.male  seen today for follow up of the following medical problems.    1. Afib - previous ER visit 01/28/15 with 1 month history of SOB and lightheadness - EKG during evlauation showed afib, a new diagnosis for the patient. K was 3.8. 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  - started on xarelto in ER, denies any bleeding issues - since last visit completed monitor which showed no significant arrhythmias. Symptoms correlated with sinus brady to 50s and NSR. No afib noted, no tachycarrhtymias noted.     - denies any palpitations - dark stool x 1 after taking pepto bismol, but no GI bleeding on anticoag  2. Bradycardia - long history. Patient reportedly refused a pacemaker in the past - ongoing for 20+ years per his report   - home health nurse noted heart rates in low 40s - can have some occasional dizziness with squatting. Can have some low energy at times. Some recent increase in fatigue, symptoms are convoluted by his coexisting lung disease.   3. SOB - severe emphysematous changes on recent CT scan. Reports previous asbestos exposures.  - tobacco x 33 years - PFTs do not show COPD, but do show decreased DLCO - followed by Dr Luan Pulling   Past Medical History:  Diagnosis Date  . A-fib ([place])   . Bradycardia   . COPD (chronic obstructive pulmonary disease) ([place])   . Frequent urination   . GERD (gastroesophageal reflux disease)   . Hypercholesterolemia   . Hypertension   . Prostate cancer ([place])    RADIATION [place] IMPLANTS 02-28-12  . Sleep apnea   . Stroke ([place])    2011;ST memory loss, no other deficits.  . Urinary urgency      No Known Allergies   Current Outpatient Prescriptions  Medication Sig Dispense Refill  . B Complex Vitamins (VITAMIN B COMPLEX PO) Take 1 tablet by mouth daily.    Marland Kitchen docusate sodium  (COLACE) 250 MG capsule Take 250 mg by mouth 2 (two) times daily.    Marland Kitchen escitalopram (LEXAPRO) 10 MG tablet Take 10 mg by mouth daily.    . Fluticasone Furoate (ARNUITY ELLIPTA) 100 MCG/ACT AEPB Inhale 1 puff into the lungs daily.    Marland Kitchen lisinopril (PRINIVIL,ZESTRIL) 5 MG tablet Take 1 tablet by mouth every evening.     . magnesium oxide (MAG-OX) 400 MG tablet Take 400 mg by mouth daily.    Marland Kitchen omeprazole (PRILOSEC) 20 MG capsule Take 20 mg by mouth daily.     . rivaroxaban (XARELTO) 20 MG TABS tablet Take 1 tablet (20 mg total) by mouth daily with supper. 90 tablet 4  . simethicone (GAS-X) 80 MG chewable tablet Chew 80 mg by mouth once as needed for flatulence. Reported on 06/20/2016    . simvastatin (ZOCOR) 40 MG tablet Take 1 tablet by mouth at bedtime.     . solifenacin (VESICARE) 5 MG tablet Take 5 mg by mouth at bedtime.     Marland Kitchen Umeclidinium-Vilanterol (ANORO ELLIPTA) 62.5-25 MCG/INH AEPB Inhale 1 puff into the lungs daily.     No current facility-administered medications for this visit.      Past Surgical History:  Procedure Laterality Date  . CATARACT EXTRACTION W/PHACO Right 11/06/2013   Procedure: CATARACT EXTRACTION PHACO AND INTRAOCULAR LENS PLACEMENT ([place]);  Surgeon: Tonny Branch, MD;  Location: [facility];  Service: Ophthalmology;  Laterality: Right;  CDE 11.67  . CATARACT EXTRACTION W/PHACO Left 11/25/2015   Procedure: CATARACT EXTRACTION PHACO AND INTRAOCULAR LENS PLACEMENT ; CDE:  8.15;  Surgeon: Tonny Branch, MD;  Location: [facility];  Service: Ophthalmology;  Laterality: Left;  . COLONOSCOPY  10/03/2006   [id number] rectal polyps cold biopsied/removed, otherwise normal rectum/Sigmoid diverticula.  Remainder of colon mucosa appeared normal  . COLONOSCOPY N/A 08/19/2014   RMR: Radiation proctitis-status post APC ablation. Colonic diverticulosis.  . CYSTOSCOPY  02/28/2012   Procedure: CYSTOSCOPY;  Surgeon: Bernestine Amass, MD;  Location: [facility];  Service: Urology;   Laterality: N/A;   no seeds noted in bladder  . RADIOACTIVE SEED IMPLANT  02/28/2012   Procedure: RADIOACTIVE SEED IMPLANT;  Surgeon: Bernestine Amass, MD;  Location[facility];  Service: Urology;  Laterality: N/A;  67seeds implanted      No Known Allergies    Family History  Problem Relation Age of Onset  . Stroke Mother   . Cancer Neg Hx      Social History Jonathan Greene reports that he quit smoking about 26 years ago. His smoking use included Cigarettes. He has a 70.00 pack-year smoking history. He quit smokeless tobacco use about 12 months ago. His smokeless tobacco use included Chew. Jonathan Greene reports that he drinks about 8.4 oz of alcohol per week .   Review of Systems CONSTITUTIONAL: No weight loss, fever, chills, weakness or fatigue.  HEENT: Eyes: No visual loss, blurred vision, double vision or yellow sclerae.No hearing loss, sneezing, congestion, runny nose or sore throat.  SKIN: No rash or itching.  CARDIOVASCULAR: per hpi RESPIRATORY: per hpi GASTROINTESTINAL: No anorexia, nausea, vomiting or diarrhea. No abdominal pain or blood.  GENITOURINARY: No burning on urination, no polyuria NEUROLOGICAL: No headache, dizziness, syncope, paralysis, ataxia, numbness or tingling in the extremities. No change in bowel or bladder control.  MUSCULOSKELETAL: No muscle, back pain, joint pain or stiffness.  LYMPHATICS: No enlarged nodes. No history of splenectomy.  PSYCHIATRIC: No history of depression or anxiety.  ENDOCRINOLOGIC: No reports of sweating, cold or heat intolerance. No polyuria or polydipsia.  Marland Kitchen   Physical Examination Vitals:   04/03/17 1628  BP: 116/66  Pulse: 76   Vitals:   04/03/17 1628  Weight: 218 lb (98.9 kg)  Height: '6\' 2"'$  (1.88 m)    Gen: resting comfortably, no acute distress HEENT: no scleral icterus, pupils equal round and reactive, no palptable cervical adenopathy,  CV: regular, no m/r/g, no jvd Resp: Clear to auscultation  bilaterally GI: abdomen is soft, non-tender, non-distended, normal bowel sounds, no hepatosplenomegaly MSK: extremities are warm, no edema.  Skin: warm, no rash Neuro:  no focal deficits Psych: appropriate affect   Diagnostic Studies 02/2015 Echo Study Conclusions  - Left ventricle: The cavity size was normal. Wall thickness was increased in a pattern of mild LVH. Systolic function was normal. The estimated ejection fraction was in the range of 50% to 55%. Wall motion was normal; there were no regional wall motion abnormalities. Doppler parameters are consistent with abnormal left ventricular relaxation (grade 1 diastolic dysfunction). - Aortic valve: Mildly calcified annulus. Trileaflet; mildly thickened leaflets. There was mild regurgitation. Valve area (VTI): 3.12 cm^2. Valve area (Vmax): 3.15 cm^2. - Aortic root: The visualized portion of the proximal ascending aorta is mildly dilated at 4.0 cm. - Mitral valve: Mildly calcified annulus. Mildly thickened leaflets . - Systemic veins: IVC appears small, suggestive of  low RA pressures and hypovolemia. - Technically adequate study.  02/2015 Event monitor Mild sinus brady, no tachyarrhythmias  03/2015 PFTs Decreased DLCO     Assessment and Plan  1. Afib - continue xarelto, CHADS2Vasc score of 4 (age, HTN, prior stroke). - no recent symptoms.    2. Bradycardia - hx of sinus bradycardia to 40-50s for over 20 years per his report, confirmed by old EKG review - some recent increase in fatigue, unclear if due to progressing bradycardia. EKG in clinic today shows sinus brady at  rate 57 - we will obtain 24 hr holter - previously would not consider pacemaker, now open to if needed.    F/u pending monitor results    Arnoldo Lenis, M.D.

## 2017-04-11 ENCOUNTER — Ambulatory Visit (HOSPITAL_COMMUNITY)
Admission: RE | Admit: 2017-04-11 | Discharge: 2017-04-11 | Disposition: A | Discharge status: Home / Self Care | Payer: Medicare Other | Source: Ambulatory Visit | Attending: Cardiology | Admitting: Cardiology

## 2017-04-11 DIAGNOSIS — R001 Bradycardia, unspecified: Secondary | ICD-10-CM | POA: Diagnosis not present

## 2017-04-18 ENCOUNTER — Other Ambulatory Visit: Payer: Self-pay

## 2017-04-18 DIAGNOSIS — R0602 Shortness of breath: Secondary | ICD-10-CM

## 2017-06-01 ENCOUNTER — Ambulatory Visit: Payer: Medicare Other | Admitting: Urology

## 2017-06-01 ENCOUNTER — Ambulatory Visit (INDEPENDENT_AMBULATORY_CARE_PROVIDER_SITE_OTHER): Payer: Medicare Other | Admitting: Urology

## 2017-06-01 DIAGNOSIS — N3281 Overactive bladder: Secondary | ICD-10-CM | POA: Diagnosis not present

## 2017-06-01 DIAGNOSIS — C61 Malignant neoplasm of prostate: Secondary | ICD-10-CM

## 2017-06-01 DIAGNOSIS — N401 Enlarged prostate with lower urinary tract symptoms: Secondary | ICD-10-CM

## 2017-06-18 ENCOUNTER — Ambulatory Visit (INDEPENDENT_AMBULATORY_CARE_PROVIDER_SITE_OTHER): Payer: Medicare Other | Admitting: Cardiology

## 2017-06-18 ENCOUNTER — Encounter: Payer: Self-pay | Admitting: Cardiology

## 2017-06-18 VITALS — BP 138/80 | HR 54 | Ht 75.0 in | Wt 221.0 lb

## 2017-06-18 DIAGNOSIS — R001 Bradycardia, unspecified: Secondary | ICD-10-CM

## 2017-06-18 DIAGNOSIS — I4891 Unspecified atrial fibrillation: Secondary | ICD-10-CM

## 2017-06-18 NOTE — Progress Notes (Signed)
Clinical Summary Mr. Wadhwa is a 76 y.o.male seen today for follow up of the following medical problems.    1. Afib - previous ER visit 01/28/15 with 1 month history of SOB and lightheadness - EKG during evlauation showed afib, a new diagnosis for the patient. K was 3.8. 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  - started on xarelto in ER, denies any bleeding issues - since last visit completed monitor which showed no significant arrhythmias. Symptoms correlated with sinus brady to 50s and NSR. No afib noted, no tachycarrhtymias noted.   - denies any palpitations since last visit   2. Bradycardia - long history. Patient reportedly refused a pacemaker in the past - ongoing for 20+ years per his report -04/2017 holter with occasional low HRs lowest 39, avg HR 56. No symptoms reported - he was to have a GXT to check chronotropic competence but has not scheduled yeet.  - still with fatigue at times, dizziness since last visit  3. SOB - severe emphysematous changes on recent CT scan. Reports previous asbestos exposures.  - tobacco x 33 years - PFTs do not show COPD, but do show decreased DLCO - followed by Dr Luan Pulling   Past Medical History:  Diagnosis Date  . A-fib ([place])   . Bradycardia   . COPD (chronic obstructive pulmonary disease) ([place])   . Frequent urination   . GERD (gastroesophageal reflux disease)   . Hypercholesterolemia   . Hypertension   . Prostate cancer ([place])    RADIATION [place] IMPLANTS 02-28-12  . Sleep apnea   . Stroke ([place])    2011;ST memory loss, no other deficits.  . Urinary urgency      No Known Allergies   Current Outpatient Prescriptions  Medication Sig Dispense Refill  . B Complex Vitamins (VITAMIN B COMPLEX PO) Take 1 tablet by mouth daily.    Marland Kitchen escitalopram (LEXAPRO) 10 MG tablet Take 10 mg by mouth daily.    . Fluticasone Furoate  (ARNUITY ELLIPTA) 100 MCG/ACT AEPB Inhale 1 puff into the lungs daily.    Marland Kitchen lisinopril (PRINIVIL,ZESTRIL) 5 MG tablet Take 1 tablet by mouth every evening.     . magnesium oxide (MAG-OX) 400 MG tablet Take 400 mg by mouth daily.    Marland Kitchen omeprazole (PRILOSEC) 20 MG capsule Take 20 mg by mouth daily.     . rivaroxaban (XARELTO) 20 MG TABS tablet Take 1 tablet (20 mg total) by mouth daily with supper. 90 tablet 4  . simethicone (GAS-X) 80 MG chewable tablet Chew 80 mg by mouth once as needed for flatulence. Reported on 06/20/2016    . simvastatin (ZOCOR) 40 MG tablet Take 1 tablet by mouth at bedtime.     . solifenacin (VESICARE) 5 MG tablet Take 5 mg by mouth at bedtime.     . tamsulosin (FLOMAX) 0.4 MG CAPS capsule Take 0.4 mg by mouth daily.     Marland Kitchen Umeclidinium-Vilanterol (ANORO ELLIPTA) 62.5-25 MCG/INH AEPB Inhale 1 puff into the lungs daily.     No current facility-administered medications for this visit.      Past Surgical History:  Procedure Laterality Date  . CATARACT EXTRACTION W/PHACO Right 11/06/2013   Procedure: CATARACT EXTRACTION PHACO AND INTRAOCULAR LENS PLACEMENT ([place]);  Surgeon: Tonny Malyk Girouard, MD;  Location: [facility];  Service: Ophthalmology;  Laterality: Right;  CDE 11.67  . CATARACT EXTRACTION  W/PHACO Left 11/25/2015   Procedure: CATARACT EXTRACTION PHACO AND INTRAOCULAR LENS PLACEMENT ; CDE:  8.15;  Surgeon: Tonny Dezerae Freiberger, MD;  Location: [facility];  Service: Ophthalmology;  Laterality: Left;  . COLONOSCOPY  10/03/2006   [id number] rectal polyps cold biopsied/removed, otherwise normal rectum/Sigmoid diverticula.  Remainder of colon mucosa appeared normal  . COLONOSCOPY N/A 08/19/2014   RMR: Radiation proctitis-status post APC ablation. Colonic diverticulosis.  . CYSTOSCOPY  02/28/2012   Procedure: CYSTOSCOPY;  Surgeon: Bernestine Amass, MD;  [facility];  Service: Urology;  Laterality: N/A;   no seeds noted in bladder  . RADIOACTIVE SEED IMPLANT  02/28/2012    Procedure: RADIOACTIVE SEED IMPLANT;  Surgeon: Bernestine Amass, MD;  Location: [facility];  Service: Urology;  Laterality: N/A;  67seeds implanted      No Known Allergies    Family History  Problem Relation Age of Onset  . Stroke Mother   . Cancer Neg Hx      Social History Mr. Limb reports that he quit smoking about 26 years ago. His smoking use included Cigarettes. He has a 70.00 pack-year smoking history. He quit smokeless tobacco use about 15 months ago. His smokeless tobacco use included Chew. Mr. Hemann reports that he drinks about 8.4 oz of alcohol per week .   Review of Systems CONSTITUTIONAL: +fatigue HEENT: Eyes: No visual loss, blurred vision, double vision or yellow sclerae.No hearing loss, sneezing, congestion, runny nose or sore throat.  SKIN: No rash or itching.  CARDIOVASCULAR: per hpi RESPIRATORY: No shortness of breath, cough or sputum.  GASTROINTESTINAL: No anorexia, nausea, vomiting or diarrhea. No abdominal pain or blood.  GENITOURINARY: No burning on urination, no polyuria NEUROLOGICAL: No headache, dizziness, syncope, paralysis, ataxia, numbness or tingling in the extremities. No change in bowel or bladder control.  MUSCULOSKELETAL: No muscle, back pain, joint pain or stiffness.  LYMPHATICS: No enlarged nodes. No history of splenectomy.  PSYCHIATRIC: No history of depression or anxiety.  ENDOCRINOLOGIC: No reports of sweating, cold or heat intolerance. No polyuria or polydipsia.  Marland Kitchen   Physical Examination Vitals:   06/18/17 0956  BP: 138/80  Pulse: (!) 54   Vitals:   06/18/17 0956  Weight: 221 lb (100.2 kg)  Height: 6\' 3"  (1.905 m)    Gen: resting comfortably, no acute distress HEENT: no scleral icterus, pupils equal round and reactive, no palptable cervical adenopathy,  CV: RRR, no m/r/g, no jvd Resp: Clear to auscultation bilaterally GI: abdomen is soft, non-tender, non-distended, normal bowel sounds, no  hepatosplenomegaly MSK: extremities are warm, no edema.  Skin: warm, no rash Neuro:  no focal deficits Psych: appropriate affect   Diagnostic Studies 02/2015 Echo Study Conclusions  - Left ventricle: The cavity size was normal. Wall thickness was increased in a pattern of mild LVH. Systolic function was normal. The estimated ejection fraction was in the range of 50% to 55%. Wall motion was normal; there were no regional wall motion abnormalities. Doppler parameters are consistent with abnormal left ventricular relaxation (grade 1 diastolic dysfunction). - Aortic valve: Mildly calcified annulus. Trileaflet; mildly thickened leaflets. There was mild regurgitation. Valve area (VTI): 3.12 cm^2. Valve area (Vmax): 3.15 cm^2. - Aortic root: The visualized portion of the proximal ascending aorta is mildly dilated at 4.0 cm. - Mitral valve: Mildly calcified annulus. Mildly thickened leaflets . - Systemic veins: IVC appears small, suggestive of low RA pressures and hypovolemia. - Technically adequate study.  02/2015 Event monitor Mild sinus brady, no  tachyarrhythmias  03/2015 PFTs Decreased DLCO  04/2017 24 hr holter  Telemetry tracings show primarily sinus rhythm  Min HR 39, Max HR 87, Avg HR 56  Rare ventricular ectopy, primarily in the form of isolated PVCs. One 3 beat run of NSVT.  Rare supraventricular ectopy, all in the form of isolated PACs. Some of the PACs are followed by a compensatory pause  No symptoms reported Assessment and Plan   1. Afib - he will continue xarelto, CHADS2Vasc score of 4 (age, HTN, prior stroke). - no recent symptoms, we will continue to monitor.    2. Bradycardia - some episodes of bradycardia on recent monitor, thought avg heart rate was reasonable around 56 - fairly nonspecific symptoms of fatigue and dizziness - we will check a GXT to evaluate for chronotropic competence.  - may require EP evaluation  Request pcp  labs  F/u pending test results       Arnoldo Lenis, M.D

## 2017-06-18 NOTE — Patient Instructions (Signed)
Your physician recommends that you schedule a follow-up appointment in: to be determined after test    Your physician has requested that you have an exercise tolerance test. For further information please visit [email or web address]. Please also follow instruction sheet, as given.       Your physician recommends that you continue on your current medications as directed. Please refer to the Current Medication list given to you today.      Thank you for choosing [facility] !

## 2017-06-21 ENCOUNTER — Encounter: Payer: Self-pay | Admitting: Orthopedic Surgery

## 2017-06-21 ENCOUNTER — Ambulatory Visit (HOSPITAL_COMMUNITY)
Admission: RE | Admit: 2017-06-21 | Discharge: 2017-06-21 | Disposition: A | Discharge status: Home / Self Care | Payer: Medicare Other | Source: Ambulatory Visit | Attending: Cardiology | Admitting: Cardiology

## 2017-06-21 DIAGNOSIS — R001 Bradycardia, unspecified: Secondary | ICD-10-CM | POA: Diagnosis present

## 2017-06-21 LAB — EXERCISE TOLERANCE TEST
CHL RATE OF PERCEIVED EXERTION: 17
CSEPED: 7 min
CSEPEW: 10.1 METS
Exercise duration (sec): 14 s
MPHR: 146 {beats}/min
Peak HR: 120 {beats}/min
Percent HR: 82 %
Rest HR: 47 {beats}/min

## 2017-08-13 ENCOUNTER — Ambulatory Visit (INDEPENDENT_AMBULATORY_CARE_PROVIDER_SITE_OTHER): Payer: Medicare Other | Admitting: Cardiology

## 2017-08-13 ENCOUNTER — Encounter: Payer: Self-pay | Admitting: Cardiology

## 2017-08-13 VITALS — BP 122/78 | HR 52 | Ht 75.0 in | Wt 220.0 lb

## 2017-08-13 DIAGNOSIS — I4891 Unspecified atrial fibrillation: Secondary | ICD-10-CM

## 2017-08-13 DIAGNOSIS — R001 Bradycardia, unspecified: Secondary | ICD-10-CM | POA: Diagnosis not present

## 2017-08-13 NOTE — Patient Instructions (Signed)

## 2017-08-13 NOTE — Progress Notes (Signed)
Clinical Summary Jonathan Greene is a 76 y.o.male seen today for follow up of the following medical problems.    1. Afib - previous ER visit 01/28/15 with 1 month history of SOB and lightheadness - EKG during evlauation showed afib, a new diagnosis for the patient. K was 3.8. 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  - started on xarelto in ER, denies any bleeding issues - since last visit completed monitor which showed no significant arrhythmias. Symptoms correlated with sinus brady to 50s and NSR. No afib noted, no tachycarrhtymias noted.    - no recent palpitations since last visit  2. Bradycardia - long history. Patient reportedly refused a pacemaker in the past - ongoing for 20+ years per his report -04/2017 holter with occasional low HRs lowest 39, avg HR 56. No symptoms reported -06/2017 GXT rest HR 47, max HR 120 - continues to have some low energy at times, fairly nonspecific fatigue.     3. SOB - severe emphysematous changes on recent CT scan. Reports previous asbestos exposures.  - tobacco x 33 years - PFTs do not show COPD, but do show decreased DLCO - followed by Dr Luan Pulling  4. Right should pain - followed by ortho  5. OSA  - mixed compliance with CPAP - 07/2015 sleep study severe OSA.   Past Medical History:  Diagnosis Date  . A-fib ([place])   . Bradycardia   . COPD (chronic obstructive pulmonary disease) ([place])   . Frequent urination   . GERD (gastroesophageal reflux disease)   . Hypercholesterolemia   . Hypertension   . Prostate cancer ([place])    RADIATION [place] IMPLANTS 02-28-12  . Sleep apnea   . Stroke ([place])    2011;ST memory loss, no other deficits.  . Urinary urgency      No Known Allergies   Current Outpatient Prescriptions  Medication Sig Dispense Refill  . escitalopram (LEXAPRO) 10 MG tablet Take 10 mg by mouth daily.    .  Fluticasone Furoate (ARNUITY ELLIPTA) 100 MCG/ACT AEPB Inhale 1 puff into the lungs daily.    Marland Kitchen lisinopril (PRINIVIL,ZESTRIL) 5 MG tablet Take 1 tablet by mouth every evening.     Marland Kitchen omeprazole (PRILOSEC) 20 MG capsule Take 20 mg by mouth daily.     . rivaroxaban (XARELTO) 20 MG TABS tablet Take 1 tablet (20 mg total) by mouth daily with supper. 90 tablet 4  . simethicone (GAS-X) 80 MG chewable tablet Chew 80 mg by mouth once as needed for flatulence. Reported on 06/20/2016    . simvastatin (ZOCOR) 40 MG tablet Take 1 tablet by mouth at bedtime.     . solifenacin (VESICARE) 5 MG tablet Take 5 mg by mouth at bedtime.     . tamsulosin (FLOMAX) 0.4 MG CAPS capsule Take 0.4 mg by mouth daily.     Marland Kitchen Umeclidinium-Vilanterol (ANORO ELLIPTA) 62.5-25 MCG/INH AEPB Inhale 1 puff into the lungs daily.     No current facility-administered medications for this visit.      Past Surgical History:  Procedure Laterality Date  . CATARACT EXTRACTION W/PHACO Right 11/06/2013   Procedure: CATARACT EXTRACTION PHACO AND INTRAOCULAR LENS PLACEMENT ([place]);  Surgeon: Tonny Branch, MD;  Location: [facility];  Service: Ophthalmology;  Laterality: Right;  CDE 11.67  . CATARACT EXTRACTION W/PHACO Left 11/25/2015   Procedure: CATARACT EXTRACTION PHACO AND INTRAOCULAR LENS PLACEMENT ;  CDE:  8.15;  Surgeon: Tonny Branch, MD;  Location: [facility];  Service: Ophthalmology;  Laterality: Left;  . COLONOSCOPY  10/03/2006   [id number] rectal polyps cold biopsied/removed, otherwise normal rectum/Sigmoid diverticula.  Remainder of colon mucosa appeared normal  . COLONOSCOPY N/A 08/19/2014   RMR: Radiation proctitis-status post APC ablation. Colonic diverticulosis.  . CYSTOSCOPY  02/28/2012   Procedure: CYSTOSCOPY;  Surgeon: Bernestine Amass, MD;  Location: [facility];  Service: Urology;  Laterality: N/A;   no seeds noted in bladder  . RADIOACTIVE SEED IMPLANT  02/28/2012   Procedure: RADIOACTIVE SEED IMPLANT;  Surgeon: Bernestine Amass, MD;  Location: [facility];  Service: Urology;  Laterality: N/A;  67seeds implanted      No Known Allergies    Family History  Problem Relation Age of Onset  . Stroke Mother   . Cancer Neg Hx      Social History Mr. Mandley reports that he quit smoking about 26 years ago. His smoking use included Cigarettes. He has a 70.00 pack-year smoking history. He quit smokeless tobacco use about 17 months ago. His smokeless tobacco use included Chew. Mr. Lollar reports that he drinks about 8.4 oz of alcohol per week .   Review of Systems CONSTITUTIONAL: +fatigue HEENT: Eyes: No visual loss, blurred vision, double vision or yellow sclerae.No hearing loss, sneezing, congestion, runny nose or sore throat.  SKIN: No rash or itching.  CARDIOVASCULAR: per hpi RESPIRATORY: per hpi GASTROINTESTINAL: No anorexia, nausea, vomiting or diarrhea. No abdominal pain or blood.  GENITOURINARY: No burning on urination, no polyuria NEUROLOGICAL: No headache, dizziness, syncope, paralysis, ataxia, numbness or tingling in the extremities. No change in bowel or bladder control.  MUSCULOSKELETAL: No muscle, back pain, joint pain or stiffness.  LYMPHATICS: No enlarged nodes. No history of splenectomy.  PSYCHIATRIC: No history of depression or anxiety.  ENDOCRINOLOGIC: No reports of sweating, cold or heat intolerance. No polyuria or polydipsia.  Marland Kitchen   Physical Examination Vitals:   08/13/17 0904  BP: 122/78  Pulse: (!) 52  SpO2: 98%   Vitals:   08/13/17 0904  Weight: 220 lb (99.8 kg)  Height: 6\' 3"  (1.905 m)    Gen: resting comfortably, no acute distress HEENT: no scleral icterus, pupils equal round and reactive, no palptable cervical adenopathy,  CV: irreg, no m/r/g, no jvd Resp: Clear to auscultation bilaterally GI: abdomen is soft, non-tender, non-distended, normal bowel sounds, no hepatosplenomegaly MSK: extremities are warm, no edema.  Skin: warm, no rash Neuro:  no  focal deficits Psych: appropriate affect   Diagnostic Studies 02/2015 Echo Study Conclusions  - Left ventricle: The cavity size was normal. Wall thickness was increased in a pattern of mild LVH. Systolic function was normal. The estimated ejection fraction was in the range of 50% to 55%. Wall motion was normal; there were no regional wall motion abnormalities. Doppler parameters are consistent with abnormal left ventricular relaxation (grade 1 diastolic dysfunction). - Aortic valve: Mildly calcified annulus. Trileaflet; mildly thickened leaflets. There was mild regurgitation. Valve area (VTI): 3.12 cm^2. Valve area (Vmax): 3.15 cm^2. - Aortic root: The visualized portion of the proximal ascending aorta is mildly dilated at 4.0 cm. - Mitral valve: Mildly calcified annulus. Mildly thickened leaflets . - Systemic veins: IVC appears small, suggestive of low RA pressures and hypovolemia. - Technically adequate study.  02/2015 Event monitor Mild sinus brady, no tachyarrhythmias  03/2015 PFTs Decreased DLCO  04/2017 24 hr holter  Telemetry tracings show primarily sinus  rhythm  Min HR 39, Max HR 87, Avg HR 56  Rare ventricular ectopy, primarily in the form of isolated PVCs. One 3 beat run of NSVT.  Rare supraventricular ectopy, all in the form of isolated PACs. Some of the PACs are followed by a compensatory pause  No symptoms reported    Assessment and Plan   1. Afib - CHADS2Vasc score of 4 (age, HTN, prior stroke), continue anticoagulation - no recent symptoms.    2. Bradycardia - nonspecific fatigue symptoms. Holter monitor shows avg heart rates in mid 50s, GXT shows normal chronotropic response - would continue watchful waiting at this time, no indication for pacemaker as of yet   F/u 6 months   Jonathan Greene, M.D

## 2018-02-11 ENCOUNTER — Encounter: Payer: Self-pay | Admitting: Cardiology

## 2018-02-11 ENCOUNTER — Ambulatory Visit: Payer: Medicare Other | Admitting: Cardiology

## 2018-02-11 VITALS — BP 130/86 | HR 79 | Ht 75.0 in | Wt 225.8 lb

## 2018-02-11 DIAGNOSIS — I4891 Unspecified atrial fibrillation: Secondary | ICD-10-CM | POA: Diagnosis not present

## 2018-02-11 DIAGNOSIS — R001 Bradycardia, unspecified: Secondary | ICD-10-CM

## 2018-02-11 NOTE — Progress Notes (Signed)
Clinical Summary Mr. Revoir is a 77 y.o.male seen today for follow up of the following medical problems.    1. Afib - previous ER visit 01/28/15 with 1 month history of SOB and lightheadness - EKG during evlauation showed afib, a new diagnosis for the patient. K was 3.8. 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  - started on xarelto in ER, denies any bleeding issues -  completed monitor which showed no significant arrhythmias. Symptoms correlated with sinus brady to 50s and NSR. No afib noted, no tachycarrhtymias noted.    - No recent palpitations - no bleeding issues with xarelto    2. Bradycardia - long history. Patient reportedly refused a pacemaker in the past - ongoing for 20+ years per his report -04/2017 holter with occasional low HRs lowest 39, avg HR 56. No symptoms reported -06/2017 GXT rest HR 47, max HR 120  - still some lightheadness/dizziness at times, stable   3. SOB - severe emphysematous changes on recent CT scan. Reports previous asbestos exposures.  - tobacco x 33 years - PFTs do not show COPD, but do show decreased DLCO - followed by Dr Luan Pulling   4. OSA  - mixed compliance with CPAP - 07/2015 sleep study severe OSA.    Past Medical History:  Diagnosis Date  . A-fib ([place])   . Bradycardia   . COPD (chronic obstructive pulmonary disease) ([place])   . Frequent urination   . GERD (gastroesophageal reflux disease)   . Hypercholesterolemia   . Hypertension   . Prostate cancer ([place])    RADIATION [place] IMPLANTS 02-28-12  . Sleep apnea   . Stroke ([place])    2011;ST memory loss, no other deficits.  . Urinary urgency      No Known Allergies   Current Outpatient Medications  Medication Sig Dispense Refill  . cholecalciferol (VITAMIN D) 1000 units tablet Take 1,000 Units by mouth daily.    Marland Kitchen escitalopram (LEXAPRO) 10 MG tablet Take 10 mg  by mouth daily.    . Fluticasone Furoate (ARNUITY ELLIPTA) 100 MCG/ACT AEPB Inhale 1 puff into the lungs daily.    Marland Kitchen lisinopril (PRINIVIL,ZESTRIL) 5 MG tablet Take 1 tablet by mouth every evening.     Marland Kitchen omeprazole (PRILOSEC) 20 MG capsule Take 20 mg by mouth daily.     . rivaroxaban (XARELTO) 20 MG TABS tablet Take 1 tablet (20 mg total) by mouth daily with supper. 90 tablet 4  . simethicone (GAS-X) 80 MG chewable tablet Chew 80 mg by mouth once as needed for flatulence. Reported on 06/20/2016    . simvastatin (ZOCOR) 40 MG tablet Take 1 tablet by mouth at bedtime.     . solifenacin (VESICARE) 5 MG tablet Take 5 mg by mouth at bedtime.     . tamsulosin (FLOMAX) 0.4 MG CAPS capsule Take 0.4 mg by mouth daily.     Marland Kitchen Umeclidinium-Vilanterol (ANORO ELLIPTA) 62.5-25 MCG/INH AEPB Inhale 1 puff into the lungs daily.     No current facility-administered medications for this visit.      Past Surgical History:  Procedure Laterality Date  . CATARACT EXTRACTION W/PHACO Right 11/06/2013   Procedure: CATARACT EXTRACTION PHACO AND INTRAOCULAR LENS PLACEMENT ([facility]);  Surgeon: Tonny Kiyanna Biegler, MD;  Location: [facility];  Service: Ophthalmology;  Laterality: Right;  CDE 11.67  . CATARACT EXTRACTION W/PHACO Left 11/25/2015   Procedure: CATARACT  EXTRACTION PHACO AND INTRAOCULAR LENS PLACEMENT ; CDE:  8.15;  Surgeon: Tonny Shakeya Kerkman, MD;  Location: [facility];  Service: Ophthalmology;  Laterality: Left;  . COLONOSCOPY  10/03/2006   VZD:GLOVFIEPPI rectal polyps cold biopsied/removed, otherwise normal rectum/Sigmoid diverticula.  Remainder of colon mucosa appeared normal  . COLONOSCOPY N/A 08/19/2014   RMR: Radiation proctitis-status post APC ablation. Colonic diverticulosis.  . CYSTOSCOPY  02/28/2012   Procedure: CYSTOSCOPY;  Surgeon: Bernestine Amass, MD;  Location[facility];  Service: Urology;  Laterality: N/A;   no seeds noted in bladder  . RADIOACTIVE SEED IMPLANT  02/28/2012   Procedure: RADIOACTIVE SEED IMPLANT;   Surgeon: Bernestine Amass, MD;  Location[facility];  Service: Urology;  Laterality: N/A;  67seeds implanted      No Known Allergies    Family History  Problem Relation Age of Onset  . Stroke Mother   . Cancer Neg Hx      Social History Mr. Oleski reports that he quit smoking about 26 years ago. His smoking use included cigarettes. He has a 70.00 pack-year smoking history. He quit smokeless tobacco use about 23 months ago. His smokeless tobacco use included chew. Mr. Windsor reports that he drinks about 8.4 oz of alcohol per week.   Review of Systems CONSTITUTIONAL: No weight loss, fever, chills, weakness or fatigue.  HEENT: Eyes: No visual loss, blurred vision, double vision or yellow sclerae.No hearing loss, sneezing, congestion, runny nose or sore throat.  SKIN: No rash or itching.  CARDIOVASCULAR: per hpi RESPIRATORY: per hpi GASTROINTESTINAL: No anorexia, nausea, vomiting or diarrhea. No abdominal pain or blood.  GENITOURINARY: No burning on urination, no polyuria NEUROLOGICAL: No headache, dizziness, syncope, paralysis, ataxia, numbness or tingling in the extremities. No change in bowel or bladder control.  MUSCULOSKELETAL: No muscle, back pain, joint pain or stiffness.  LYMPHATICS: No enlarged nodes. No history of splenectomy.  PSYCHIATRIC: No history of depression or anxiety.  ENDOCRINOLOGIC: No reports of sweating, cold or heat intolerance. No polyuria or polydipsia.  Marland Kitchen   Physical Examination Vitals:   02/11/18 1541  BP: 130/86  Pulse: 79  SpO2: 93%   Filed Weights   02/11/18 1541  Weight: 225 lb 12.8 oz (102.4 kg)    Gen: resting comfortably, no acute distress HEENT: no scleral icterus, pupils equal round and reactive, no palptable cervical adenopathy,  CV: RRR, no m/r/g, no jvd Resp: Clear to auscultation bilaterally GI: abdomen is soft, non-tender, non-distended, normal bowel sounds, no hepatosplenomegaly MSK: extremities are warm, no  edema.  Skin: warm, no rash Neuro:  no focal deficits Psych: appropriate affect   Diagnostic Studies 02/2015 Echo Study Conclusions  - Left ventricle: The cavity size was normal. Wall thickness was increased in a pattern of mild LVH. Systolic function was normal. The estimated ejection fraction was in the range of 50% to 55%. Wall motion was normal; there were no regional wall motion abnormalities. Doppler parameters are consistent with abnormal left ventricular relaxation (grade 1 diastolic dysfunction). - Aortic valve: Mildly calcified annulus. Trileaflet; mildly thickened leaflets. There was mild regurgitation. Valve area (VTI): 3.12 cm^2. Valve area (Vmax): 3.15 cm^2. - Aortic root: The visualized portion of the proximal ascending aorta is mildly dilated at 4.0 cm. - Mitral valve: Mildly calcified annulus. Mildly thickened leaflets . - Systemic veins: IVC appears small, suggestive of low RA pressures and hypovolemia. - Technically adequate study.  02/2015 Event monitor Mild sinus brady, no tachyarrhythmias  03/2015 PFTs Decreased DLCO  04/2017 24 hr holter  Telemetry tracings show primarily sinus rhythm  Min HR 39, Max HR 87, Avg HR 56  Rare ventricular ectopy, primarily in the form of isolated PVCs. One 3 beat run of NSVT.  Rare supraventricular ectopy, all in the form of isolated PACs. Some of the PACs are followed by a compensatory pause  No symptoms reported    Assessment and Plan   1. Afib - CHADS2Vasc score of 4 (age, HTN, prior stroke), continue anticoagulation - doing well, continue current meds   2. Bradycardia - nonspecific fatigue symptoms. Holter monitor shows avg heart rates in mid 50s, GXT shows normal chronotropic response - we will contiue to monitor at this time, no indication of pacing at this time.    F/u 6 months     Arnoldo Lenis, M.D

## 2018-02-11 NOTE — Patient Instructions (Signed)
Your physician wants you to follow-up in:  6 months with Dr.Branch You will receive a reminder letter in the mail two months in advance. If you don't receive a letter, please call our office to schedule the follow-up appointment.   Your physician recommends that you continue on your current medications as directed. Please refer to the Current Medication list given to you today.    If you need a refill on your cardiac medications before your next appointment, please call your pharmacy.     NO LAB WORK OR TESTS ORDERED TODAY.     Thank you for choosing [facility] !

## 2018-02-14 ENCOUNTER — Encounter: Payer: Self-pay | Admitting: Cardiology

## 2019-02-13 ENCOUNTER — Ambulatory Visit (INDEPENDENT_AMBULATORY_CARE_PROVIDER_SITE_OTHER): Payer: Medicare Other | Admitting: Cardiology

## 2019-02-13 ENCOUNTER — Other Ambulatory Visit: Payer: Self-pay

## 2019-02-13 ENCOUNTER — Encounter: Payer: Self-pay | Admitting: Cardiology

## 2019-02-13 VITALS — BP 146/83 | HR 48 | Ht 75.0 in | Wt 221.0 lb

## 2019-02-13 DIAGNOSIS — R001 Bradycardia, unspecified: Secondary | ICD-10-CM

## 2019-02-13 DIAGNOSIS — I10 Essential (primary) hypertension: Secondary | ICD-10-CM | POA: Diagnosis not present

## 2019-02-13 DIAGNOSIS — I4891 Unspecified atrial fibrillation: Secondary | ICD-10-CM | POA: Diagnosis not present

## 2019-02-13 NOTE — Patient Instructions (Signed)
Your physician wants you to follow-up in: [place] will receive a reminder letter in the mail two months in advance. If you don't receive a letter, please call our office to schedule the follow-up appointment.  Your physician recommends that you continue on your current medications as directed. Please refer to the Current Medication list given to you today.  Your physician recommends that you return for lab work  [facility] has requested that you regularly monitor and record your blood pressure readings at home. Please use the same machine at the same time FOR 2 WEEKS AND CALL OR BRING IN READINGS   DASH Eating Plan DASH stands for "Dietary Approaches to Stop Hypertension." The DASH eating plan is a healthy eating plan that has been shown to reduce high blood pressure (hypertension). It may also reduce your risk for type 2 diabetes, heart disease, and stroke. The DASH eating plan may also help with weight loss. What are tips for following this plan?  General guidelines  Avoid eating more than 2,300 mg (milligrams) of salt (sodium) a day. If you have hypertension, you may need to reduce your sodium intake to 1,500 mg a day.  Limit alcohol intake to no more than 1 drink a day for nonpregnant women and 2 drinks a day for men. One drink equals 12 oz of beer, 5 oz of wine, or 1 oz of hard liquor.  Work with your health care provider to maintain a healthy body weight or to lose weight. Ask what an ideal weight is for you.  Get at least 30 minutes of exercise that causes your heart to beat faster (aerobic exercise) most days of the week. Activities may include walking, swimming, or biking.  Work with your health care provider or diet and nutrition specialist (dietitian) to adjust your eating plan to your individual calorie needs. Reading food labels   Check food labels for the amount of sodium per serving. Choose foods with less than 5 percent of the Daily  Value of sodium. Generally, foods with less than 300 mg of sodium per serving fit into this eating plan.  To find whole grains, look for the word "whole" as the first word in the ingredient list. Shopping  Buy products labeled as "low-sodium" or "no salt added."  Buy fresh foods. Avoid canned foods and premade or frozen meals. Cooking  Avoid adding salt when cooking. Use salt-free seasonings or herbs instead of table salt or sea salt. Check with your health care provider or pharmacist before using salt substitutes.  Do not fry foods. Cook foods using healthy methods such as baking, boiling, grilling, and broiling instead.  Cook with heart-healthy oils, such as olive, canola, soybean, or sunflower oil. Meal planning  Eat a balanced diet that includes: ? 5 or more servings of fruits and vegetables each day. At each meal, try to fill half of your plate with fruits and vegetables. ? Up to 6-8 servings of whole grains each day. ? Less than 6 oz of lean meat, poultry, or fish each day. A 3-oz serving of meat is about the same size as a deck of cards. One egg equals 1 oz. ? 2 servings of low-fat dairy each day. ? A serving of nuts, seeds, or beans 5 times each week. ? Heart-healthy fats. Healthy fats called Omega-3 fatty acids are found in foods such as flaxseeds and coldwater fish, like sardines, salmon, and mackerel.  Limit how much you eat  of the following: ? Canned or prepackaged foods. ? Food that is high in trans fat, such as fried foods. ? Food that is high in saturated fat, such as fatty meat. ? Sweets, desserts, sugary drinks, and other foods with added sugar. ? Full-fat dairy products.  Do not salt foods before eating.  Try to eat at least 2 vegetarian meals each week.  Eat more home-cooked food and less restaurant, buffet, and fast food.  When eating at a restaurant, ask that your food be prepared with less salt or no salt, if possible. What foods are recommended? The  items listed may not be a complete list. Talk with your dietitian about what dietary choices are best for you. Grains Whole-grain or whole-wheat bread. Whole-grain or whole-wheat pasta. Brown rice. [place]. Bulgur. Whole-grain and low-sodium cereals. Pita bread. Low-fat, low-sodium crackers. Whole-wheat flour tortillas. Vegetables Fresh or frozen vegetables (raw, steamed, roasted, or grilled). Low-sodium or reduced-sodium tomato and vegetable juice. Low-sodium or reduced-sodium tomato sauce and tomato paste. Low-sodium or reduced-sodium canned vegetables. Fruits All fresh, dried, or frozen fruit. Canned fruit in natural juice (without added sugar). Meat and other protein foods Skinless chicken or [place]. Ground chicken or [place]. Pork with fat trimmed off. Fish and seafood. Egg whites. Dried beans, peas, or lentils. Unsalted nuts, nut butters, and seeds. Unsalted canned beans. Lean cuts of beef with fat trimmed off. Low-sodium, lean deli meat. Dairy Low-fat (1%) or fat-free (skim) milk. Fat-free, low-fat, or reduced-fat cheeses. Nonfat, low-sodium ricotta or cottage cheese. Low-fat or nonfat yogurt. Low-fat, low-sodium cheese. Fats and oils Soft margarine without trans fats. Vegetable oil. Low-fat, reduced-fat, or light mayonnaise and salad dressings (reduced-sodium). Canola, safflower, olive, soybean, and sunflower oils. Avocado. Seasoning and other foods Herbs. Spices. Seasoning mixes without salt. Unsalted popcorn and pretzels. Fat-free sweets. What foods are not recommended? The items listed may not be a complete list. Talk with your dietitian about what dietary choices are best for you. Grains Baked goods made with fat, such as croissants, muffins, or some breads. Dry pasta or rice meal packs. Vegetables Creamed or fried vegetables. Vegetables in a cheese sauce. Regular canned vegetables (not low-sodium or reduced-sodium). Regular canned tomato sauce and paste (not low-sodium or  reduced-sodium). Regular tomato and vegetable juice (not low-sodium or reduced-sodium). Angie Fava. Olives. Fruits Canned fruit in a light or heavy syrup. Fried fruit. Fruit in cream or butter sauce. Meat and other protein foods Fatty cuts of meat. Ribs. Fried meat. Berniece Salines. Sausage. Bologna and other processed lunch meats. Salami. Fatback. Hotdogs. Bratwurst. Salted nuts and seeds. Canned beans with added salt. Canned or smoked fish. Whole eggs or egg yolks. Chicken or [place] with skin. Dairy Whole or 2% milk, cream, and half-and-half. Whole or full-fat cream cheese. Whole-fat or sweetened yogurt. Full-fat cheese. Nondairy creamers. Whipped toppings. Processed cheese and cheese spreads. Fats and oils Butter. Stick margarine. Lard. Shortening. Ghee. Bacon fat. Tropical oils, such as coconut, palm kernel, or palm oil. Seasoning and other foods Salted popcorn and pretzels. Onion salt, garlic salt, seasoned salt, table salt, and sea salt. Worcestershire sauce. Tartar sauce. Barbecue sauce. Teriyaki sauce. Soy sauce, including reduced-sodium. Steak sauce. Canned and packaged gravies. Fish sauce. Oyster sauce. Cocktail sauce. Horseradish that you find on the shelf. Ketchup. Mustard. Meat flavorings and tenderizers. Bouillon cubes. Hot sauce and Tabasco sauce. Premade or packaged marinades. Premade or packaged taco seasonings. Relishes. Regular salad dressings. Where to find more information:  National Heart, Lung, and Blood Institute: https://wilson-eaton.com/  American Heart  Association: www.heart.org Summary  The DASH eating plan is a healthy eating plan that has been shown to reduce high blood pressure (hypertension). It may also reduce your risk for type 2 diabetes, heart disease, and stroke.  With the DASH eating plan, you should limit salt (sodium) intake to 2,300 mg a day. If you have hypertension, you may need to reduce your sodium intake to 1,500 mg a day.  When on the DASH eating plan, aim to eat more  fresh fruits and vegetables, whole grains, lean proteins, low-fat dairy, and heart-healthy fats.  Work with your health care provider or diet and nutrition specialist (dietitian) to adjust your eating plan to your individual calorie needs. This information is not intended to replace advice given to you by your health care provider. Make sure you discuss any questions you have with your health care provider. Document Released: 11/09/2011 Document Revised: 11/13/2016 Document Reviewed: 11/13/2016 Elsevier Interactive Patient Education  2019 [employer].

## 2019-02-13 NOTE — Progress Notes (Signed)
Clinical Summary [name] is a 78 y.o.male  seen today for follow up of the following medical problems.    1. Afib - previous ER visit 01/28/15 with 1 month history of SOB and lightheadness - EKG during evlauation showed afib, a new diagnosis for the patient. K was 3.8. 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  - started on xarelto in ER, denies any bleeding issues -  completed monitor which showed no significant arrhythmias. Symptoms correlated with sinus brady to 50s and NSR. No afib noted, no tachycarrhtymias noted.    - no palpitations - compliant with meds, no bleeding on xarelto.     2. Bradycardia - long history. Patient reportedly refused a pacemaker in the past - ongoing for 20+ years per his report -04/2017 holter with occasional low HRs lowest 39, avg HR 56. No symptoms reported -06/2017 GXT rest HR 47, max HR 120   - no recent symptoms.    3. SOB - severe emphysematous changes on recent CT scan. Reports previous asbestos exposures.  - tobacco x 33 years - PFTs do not show COPD, but do show decreased DLCO - followed by Dr Luan Pulling   - no recent SOB.   4. OSA  - mixed compliance with CPAP - 07/2015 sleep study severe OSA.   5. HTN - pcp has been increasing lisinopril. Lisinopril increased just yesterday to 40mg  daily - home bp's have been elevated.   Past Medical History:  Diagnosis Date  . A-fib ([place])   . Bradycardia   . COPD (chronic obstructive pulmonary disease) ([place])   . Frequent urination   . GERD (gastroesophageal reflux disease)   . Hypercholesterolemia   . Hypertension   . Prostate cancer ([place])    RADIATION Jonathan Greene IMPLANTS 02-28-12  . Sleep apnea   . Stroke ([place])    2011;ST memory loss, no other deficits.  . Urinary urgency      No Known Allergies   Current Outpatient Medications  Medication Sig Dispense Refill   . cholecalciferol (VITAMIN D) 1000 units tablet Take 1,000 Units by mouth daily.    Marland Kitchen escitalopram (LEXAPRO) 10 MG tablet Take 10 mg by mouth daily.    . Fluticasone Furoate (ARNUITY ELLIPTA) 100 MCG/ACT AEPB Inhale 1 puff into the lungs daily.    Marland Kitchen lisinopril (PRINIVIL,ZESTRIL) 5 MG tablet Take 1 tablet by mouth every evening.     Marland Kitchen omeprazole (PRILOSEC) 20 MG capsule Take 20 mg by mouth daily.     . rivaroxaban (XARELTO) 20 MG TABS tablet Take 1 tablet (20 mg total) by mouth daily with supper. 90 tablet 4  . simethicone (GAS-X) 80 MG chewable tablet Chew 80 mg by mouth once as needed for flatulence. Reported on 06/20/2016    . simvastatin (ZOCOR) 40 MG tablet Take 1 tablet by mouth at bedtime.     . solifenacin (VESICARE) 5 MG tablet Take 5 mg by mouth at bedtime.     . tamsulosin (FLOMAX) 0.4 MG CAPS capsule Take 0.4 mg by mouth daily.     Marland Kitchen Umeclidinium-Vilanterol (ANORO ELLIPTA) 62.5-25 MCG/INH AEPB Inhale 1 puff into the lungs daily.     No current facility-administered medications for this visit.      Past Surgical History:  Procedure Laterality Date  . CATARACT EXTRACTION W/PHACO Right 11/06/2013   Procedure: CATARACT EXTRACTION PHACO AND INTRAOCULAR LENS PLACEMENT (  [place]);  Surgeon: Tonny Navdeep Fessenden, MD;  Location: [facility];  Service: Ophthalmology;  Laterality: Right;  CDE 11.67  . CATARACT EXTRACTION W/PHACO Left 11/25/2015   Procedure: CATARACT EXTRACTION PHACO AND INTRAOCULAR LENS PLACEMENT ; CDE:  8.15;  Surgeon: Tonny Verdean Murin, MD;  Location: [facility];  Service: Ophthalmology;  Laterality: Left;  . COLONOSCOPY  10/03/2006   [id number] rectal polyps cold biopsied/removed, otherwise normal rectum/Sigmoid diverticula.  Remainder of colon mucosa appeared normal  . COLONOSCOPY N/A 08/19/2014   RMR: Radiation proctitis-status post APC ablation. Colonic diverticulosis.  . CYSTOSCOPY  02/28/2012   Procedure: CYSTOSCOPY;  Surgeon: Bernestine Amass, MD;  Location: [facility];  Service:  Urology;  Laterality: N/A;   no seeds noted in bladder  . RADIOACTIVE SEED IMPLANT  02/28/2012   Procedure: RADIOACTIVE SEED IMPLANT;  Surgeon: Bernestine Amass, MD;  Location: [facility];  Service: Urology;  Laterality: N/A;  67seeds implanted      No Known Allergies    Family History  Problem Relation Age of Onset  . Stroke Mother   . Cancer Neg Hx      Social History [name] reports that he quit smoking about 27 years ago. His smoking use included cigarettes. He has a 70.00 pack-year smoking history. He quit smokeless tobacco use about 2 years ago.  His smokeless tobacco use included chew. [name] reports current alcohol use of about 14.0 standard drinks of alcohol per week.   Review of Systems CONSTITUTIONAL: No weight loss, fever, chills, weakness or fatigue.  HEENT: Eyes: No visual loss, blurred vision, double vision or yellow sclerae.No hearing loss, sneezing, congestion, runny nose or sore throat.  SKIN: No rash or itching.  CARDIOVASCULAR: per hpi RESPIRATORY: No shortness of breath, cough or sputum.  GASTROINTESTINAL: No anorexia, nausea, vomiting or diarrhea. No abdominal pain or blood.  GENITOURINARY: No burning on urination, no polyuria NEUROLOGICAL: No headache, dizziness, syncope, paralysis, ataxia, numbness or tingling in the extremities. No change in bowel or bladder control.  MUSCULOSKELETAL: No muscle, back pain, joint pain or stiffness.  LYMPHATICS: No enlarged nodes. No history of splenectomy.  PSYCHIATRIC: No history of depression or anxiety.  ENDOCRINOLOGIC: No reports of sweating, cold or heat intolerance. No polyuria or polydipsia.  Marland Kitchen   Physical Examination Today's Vitals   02/13/19 1424  BP: (!) 146/83  Pulse: (!) 48  SpO2: 94%  Weight: 221 lb (100.2 kg)  Height: 6\' 3"  (1.905 m)   Body mass index is 27.62 kg/m.  Gen: resting comfortably, no acute distress HEENT: no scleral icterus, pupils equal round and reactive, no  palptable cervical adenopathy,  CV: RRR, no m/rg, no jvd Resp: Clear to auscultation bilaterally GI: abdomen is soft, non-tender, non-distended, normal bowel sounds, no hepatosplenomegaly MSK: extremities are warm, no edema.  Skin: warm, no rash Neuro:  no focal deficits Psych: appropriate affect   Diagnostic Studies 02/2015 Echo Study Conclusions  - Left ventricle: The cavity size was normal. Wall thickness was increased in a pattern of mild LVH. Systolic function was normal. The estimated ejection fraction was in the range of 50% to 55%. Wall motion was normal; there were no regional wall motion abnormalities. Doppler parameters are consistent with abnormal left ventricular relaxation (grade 1 diastolic dysfunction). - Aortic valve: Mildly calcified annulus. Trileaflet; mildly thickened leaflets. There was mild regurgitation. Valve area (VTI): 3.12 cm^2. Valve area (Vmax): 3.15 cm^2. - Aortic root: The visualized portion of the proximal ascending aorta is mildly dilated at  4.0 cm. - Mitral valve: Mildly calcified annulus. Mildly thickened leaflets . - Systemic veins: IVC appears small, suggestive of low RA pressures and hypovolemia. - Technically adequate study.  02/2015 Event monitor Mild sinus brady, no tachyarrhythmias  03/2015 PFTs Decreased DLCO  04/2017 24 hr holter  Telemetry tracings show primarily sinus rhythm  Min HR 39, Max HR 87, Avg HR 56  Rare ventricular ectopy, primarily in the form of isolated PVCs. One 3 beat run of NSVT.  Rare supraventricular ectopy, all in the form of isolated PACs. Some of the PACs are followed by a compensatory pause  No symptoms reported    Assessment and Plan  1. Afib - no recent symptoms,continue current meds including anticoag.    2. Bradycardia -Holter monitor shows avg heart rates in mid 50s, GXT shows normal chronotropic response - chronic asymptomatic bradycardia, continue to monitor -  EKG today shows sinus brady  3. HTN - above goal, pcp just increased lisinopril to 40mg  daily yesterday. Repeat BMET in 2 weeks, submit bp log in 2 weeks - discussed DASH diet      F/u 6 months      Arnoldo Lenis, M.D.

## 2019-03-05 ENCOUNTER — Telehealth: Payer: Self-pay | Admitting: Cardiology

## 2019-03-05 MED ORDER — AMLODIPINE BESYLATE 5 MG PO TABS: 5.0000 mg | ORAL_TABLET | Freq: Every day | ORAL | 6 refills | Status: DC

## 2019-03-05 MED ORDER — LISINOPRIL 40 MG PO TABS: 40.0000 mg | ORAL_TABLET | Freq: Every day | ORAL | 6 refills | Status: DC

## 2019-03-05 NOTE — Telephone Encounter (Signed)
Pt informed of med changes and voiced understanding

## 2019-03-05 NOTE — Telephone Encounter (Signed)
Patient calling in BP readings per Dr.Branch/tg

## 2019-03-05 NOTE — Telephone Encounter (Signed)
Spoke with pt BP readings obtained. Pt reports that he did take all medications every morning and checked BP between 6-8 nightly.   3/23 173/84 44 3/24 144/73 55 3/25 140/82 49 3/26 176/99 56 3/27 121/79 57 3/28 132/77 55 3/29 153/81 61 3/30 164/91 51 3/31 185/94 47 recheck at 2300 179/87 41  Please advise.

## 2019-03-05 NOTE — Addendum Note (Signed)
Addended by: Levonne Hubert on: 03/05/2019 02:30 PM   Modules accepted: Orders

## 2019-03-05 NOTE — Telephone Encounter (Signed)
Patient called stating someone keeps calling but they do not have a good signal and they keep loosing the call.

## 2019-03-05 NOTE — Telephone Encounter (Signed)
BP too high, can we start norvasc 5mg  daily. Update [place] again in 2 weeks, ok to check bp just 3-4 times a week over that period   Zandra Abts MD

## 2019-03-10 ENCOUNTER — Telehealth: Payer: Self-pay | Admitting: *Deleted

## 2019-03-10 NOTE — Telephone Encounter (Signed)
-----   Message from Arnoldo Lenis, MD sent at 03/10/2019 10:23 AM EDT ----- Labs look good   Zandra Abts MD

## 2019-03-10 NOTE — Telephone Encounter (Signed)
Pt aware - routed to pcp  

## 2019-03-10 NOTE — Telephone Encounter (Signed)
LM to return call.

## 2019-03-18 ENCOUNTER — Telehealth: Payer: Self-pay | Admitting: Cardiology

## 2019-03-18 NOTE — Telephone Encounter (Signed)
Patient calling to give BP readings

## 2019-03-18 NOTE — Telephone Encounter (Signed)
BP 160/87 HR 46, 142/78 HR 49, 135/78 HR 56, 158/82 HR 42, 136/74 HR 53, 129/71  HR 59, 120/71  HR 56, 143/73  HR 52, 128/70, HR 54, 145/72  HR 40, 138/75  HR 52, 147/76  HR 42, 161/83  HR 50, 156/84  HR 41, 149/76  HR 46,

## 2019-03-24 MED ORDER — AMLODIPINE BESYLATE 10 MG PO TABS: 10.0000 mg | ORAL_TABLET | Freq: Every day | ORAL | 3 refills | Status: DC

## 2019-03-24 NOTE — Telephone Encounter (Signed)
Patient notified, will increase Norvasc to 10 mg daily, requests only 30 day supply to [facility]

## 2019-03-24 NOTE — Telephone Encounter (Signed)
BP still too high, increase norvasc to 10 mg daily   Zandra Abts MD

## 2019-03-24 NOTE — Telephone Encounter (Signed)
[id number] 

## 2019-04-25 ENCOUNTER — Telehealth: Payer: Self-pay | Admitting: Cardiology

## 2019-04-25 NOTE — Telephone Encounter (Signed)
Attempted to return call - no answer.

## 2019-04-25 NOTE — Telephone Encounter (Signed)
Patient wanting to give BP readings

## 2019-04-29 NOTE — Telephone Encounter (Signed)
2nd attempt to reach patient - No answer.

## 2019-05-02 NOTE — Telephone Encounter (Signed)
Patient did not write down the dates -   143/82  51 134/77  44 141/80  46 143/74  52 139/74  50 102/64  55 125/68  51 121/64  50 126/72  52

## 2019-05-05 NOTE — Telephone Encounter (Signed)
Patient notified and verbalized understanding. 

## 2019-05-05 NOTE — Telephone Encounter (Signed)
BP's on average look ok, no changes   Zandra Abts MD

## 2019-07-11 ENCOUNTER — Other Ambulatory Visit: Payer: Self-pay | Admitting: Cardiology

## 2019-10-20 ENCOUNTER — Other Ambulatory Visit: Payer: Self-pay

## 2019-10-20 ENCOUNTER — Encounter: Payer: Self-pay | Admitting: Cardiology

## 2019-10-20 ENCOUNTER — Ambulatory Visit: Payer: Medicare Other | Admitting: Cardiology

## 2019-10-20 VITALS — BP 126/78 | HR 74 | Temp 97.1°F | Ht 74.0 in | Wt 221.0 lb

## 2019-10-20 DIAGNOSIS — R001 Bradycardia, unspecified: Secondary | ICD-10-CM

## 2019-10-20 DIAGNOSIS — I4891 Unspecified atrial fibrillation: Secondary | ICD-10-CM

## 2019-10-20 DIAGNOSIS — I10 Essential (primary) hypertension: Secondary | ICD-10-CM | POA: Diagnosis not present

## 2019-10-20 NOTE — Patient Instructions (Signed)
Medication Instructions:  Stop NORVASC Stop LISINOPRIL    Labwork: NONE  Testing/Procedures: NONE  Follow-Up: Your physician wants you to follow-up in: 6 MONTHS.  You will receive a reminder letter in the mail two months in advance. If you don't receive a letter, please call our office to schedule the follow-up appointment.   Any Other Special Instructions Will Be Listed Below (If Applicable).  PLEASE KEEP A BLOOD PRESSURE LOG FOR 1 WEEK, THEN CALL WITH READINGS    If you need a refill on your cardiac medications before your next appointment, please call your pharmacy.

## 2019-10-20 NOTE — Progress Notes (Signed)
Clinical Summary Jonathan Greene is a 78 y.o.male seen today for follow up of the following medical problems.    1. Afib - previous ER visit 01/28/15 with 1 month history of SOB and lightheadness - EKG during evlauation showed afib, a new diagnosis for the patient. K was 3.8. 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  - started on xarelto in ER, denies any bleeding issues - completed monitor which showed no significant arrhythmias. Symptoms correlated with sinus brady to 50s and NSR. No afib noted, no tachycarrhtymias noted.     - no recent palpitations - compliant with meds  2. Bradycardia - long history. Patient reportedly refused a pacemaker in the past - ongoing for 20+ years per his report -04/2017 holter with occasional low HRs lowest 39, avg HR 56. No symptoms reported -06/2017 GXT rest HR 47, max HR 120   - no recent lightheadedness or dizziness   3. SOB - severe emphysematous changes on recent CT scan. Reports previous asbestos exposures.  - tobacco x 33 years - PFTs do not show COPD, but do show decreased DLCO - followed by Dr Luan Pulling   4. OSA  - mixed compliance with CPAP - 07/2015 sleep study severe OSA.   5. HTN - has noticed some low bp's at home. SBPs in 90s at times, can feel dizzy. Will stop his lisionpril and norvasc for a few days when this happens.   Past Medical History:  Diagnosis Date  . A-fib ([place])   . Bradycardia   . COPD (chronic obstructive pulmonary disease) ([place])   . Frequent urination   . GERD (gastroesophageal reflux disease)   . Hypercholesterolemia   . Hypertension   . Prostate cancer ([place])    RADIATION [facility] IMPLANTS 02-28-12  . Sleep apnea   . Stroke ([place])    2011;ST memory loss, no other deficits.  . Urinary urgency      No Known Allergies   Current Outpatient Medications  Medication Sig Dispense  Refill  . albuterol (PROAIR HFA) 108 (90 Base) MCG/ACT inhaler Inhale 2 puffs into the lungs every 6 (six) hours as needed for wheezing or shortness of breath.    Marland Kitchen amLODipine (NORVASC) 10 MG tablet TAKE ONE TABLET BY MOUTH ONCE DAILY. 90 tablet 3  . b complex vitamins tablet Take 1 tablet by mouth daily.    Marland Kitchen escitalopram (LEXAPRO) 10 MG tablet Take 10 mg by mouth daily.    Marland Kitchen lisinopril (PRINIVIL,ZESTRIL) 40 MG tablet Take 1 tablet (40 mg total) by mouth daily. 30 tablet 6  . omeprazole (PRILOSEC) 20 MG capsule Take 20 mg by mouth daily.     . rivaroxaban (XARELTO) 20 MG TABS tablet Take 1 tablet (20 mg total) by mouth daily with supper. 90 tablet 4  . simethicone (GAS-X) 80 MG chewable tablet Chew 80 mg by mouth once as needed for flatulence. Reported on 06/20/2016    . simvastatin (ZOCOR) 40 MG tablet Take 1 tablet by mouth at bedtime.     . solifenacin (VESICARE) 10 MG tablet Take 1 tablet by mouth daily.    . tamsulosin (FLOMAX) 0.4 MG CAPS capsule Take 0.4 mg by mouth daily.      No current facility-administered medications for this visit.      Past Surgical History:  Procedure Laterality Date  . CATARACT EXTRACTION W/PHACO Right 11/06/2013  Procedure: CATARACT EXTRACTION PHACO AND INTRAOCULAR LENS PLACEMENT ([facility]);  Surgeon: Tonny Apple Dearmas, MD;  Location: [facility];  Service: Ophthalmology;  Laterality: Right;  CDE 11.67  . CATARACT EXTRACTION W/PHACO Left 11/25/2015   Procedure: CATARACT EXTRACTION PHACO AND INTRAOCULAR LENS PLACEMENT ; CDE:  8.15;  Surgeon: Tonny Sabiha Sura, MD;  Location: [facility];  Service: Ophthalmology;  Laterality: Left;  . COLONOSCOPY  10/03/2006   [id number] rectal polyps cold biopsied/removed, otherwise normal rectum/Sigmoid diverticula.  Remainder of colon mucosa appeared normal  . COLONOSCOPY N/A 08/19/2014   RMR: Radiation proctitis-status post APC ablation. Colonic diverticulosis.  . CYSTOSCOPY  02/28/2012   Procedure: CYSTOSCOPY;  Surgeon: Bernestine Amass, MD;   Location[facility];  Service: Urology;  Laterality: N/A;   no seeds noted in bladder  . RADIOACTIVE SEED IMPLANT  02/28/2012   Procedure: RADIOACTIVE SEED IMPLANT;  Surgeon: Bernestine Amass, MD;  Location: [facility];  Service: Urology;  Laterality: N/A;  67seeds implanted      No Known Allergies    Family History  Problem Relation Age of Onset  . Stroke Mother   . Cancer Neg Hx      Social History Jonathan Greene reports that he quit smoking about 28 years ago. His smoking use included cigarettes. He has a 70.00 pack-year smoking history. His smokeless tobacco use includes chew. Jonathan Greene reports current alcohol use of about 14.0 standard drinks of alcohol per week.   Review of Systems CONSTITUTIONAL: No weight loss, fever, chills, weakness or fatigue.  HEENT: Eyes: No visual loss, blurred vision, double vision or yellow sclerae.No hearing loss, sneezing, congestion, runny nose or sore throat.  SKIN: No rash or itching.  CARDIOVASCULAR: per hpi RESPIRATORY: No shortness of breath, cough or sputum.  GASTROINTESTINAL: No anorexia, nausea, vomiting or diarrhea. No abdominal pain or blood.  GENITOURINARY: No burning on urination, no polyuria NEUROLOGICAL: No headache, dizziness, syncope, paralysis, ataxia, numbness or tingling in the extremities. No change in bowel or bladder control.  MUSCULOSKELETAL: No muscle, back pain, joint pain or stiffness.  LYMPHATICS: No enlarged nodes. No history of splenectomy.  PSYCHIATRIC: No history of depression or anxiety.  ENDOCRINOLOGIC: No reports of sweating, cold or heat intolerance. No polyuria or polydipsia.  Marland Kitchen   Physical Examination Today's Vitals   10/20/19 0848  BP: 126/78  Pulse: 74  Temp: (!) 97.1 F (36.2 C)  TempSrc: Temporal  SpO2: 98%  Weight: 221 lb (100.2 kg)  Height: 6\' 2"  (1.88 m)   Body mass index is 28.37 kg/m.  Gen: resting comfortably, no acute distress HEENT: no scleral icterus,  pupils equal round and reactive, no palptable cervical adenopathy,  CV": irreg, no m/r/g, no jvd Resp: Clear to auscultation bilaterally GI: abdomen is soft, non-tender, non-distended, normal bowel sounds, no hepatosplenomegaly MSK: extremities are warm, no edema.  Skin: warm, no rash Neuro:  no focal deficits Psych: appropriate affect   Diagnostic Studies  02/2015 Echo Study Conclusions  - Left ventricle: The cavity size was normal. Wall thickness was increased in a pattern of mild LVH. Systolic function was normal. The estimated ejection fraction was in the range of 50% to 55%. Wall motion was normal; there were no regional wall motion abnormalities. Doppler parameters are consistent with abnormal left ventricular relaxation (grade 1 diastolic dysfunction). - Aortic valve: Mildly calcified annulus. Trileaflet; mildly thickened leaflets. There was mild regurgitation. Valve area (VTI): 3.12 cm^2. Valve area (Vmax): 3.15 cm^2. - Aortic root: The visualized portion of the  proximal ascending aorta is mildly dilated at 4.0 cm. - Mitral valve: Mildly calcified annulus. Mildly thickened leaflets . - Systemic veins: IVC appears small, suggestive of low RA pressures and hypovolemia. - Technically adequate study.  02/2015 Event monitor Mild sinus brady, no tachyarrhythmias  03/2015 PFTs Decreased DLCO  04/2017 24 hr holter  Telemetry tracings show primarily sinus rhythm  Min HR 39, Max HR 87, Avg HR 56  Rare ventricular ectopy, primarily in the form of isolated PVCs. One 3 beat run of NSVT.  Rare supraventricular ectopy, all in the form of isolated PACs. Some of the PACs are followed by a compensatory pause  No symptoms reported   Assessment and Plan   1. Afib - no symptoms - continue current medsincluding anticoag - EKG today shows afib rate 84   2. Bradycardia -Holter monitor shows avg heart rates in mid 50s, GXT shows normal chronotropic  response - home heart rates above his normal, now in 80s to 90s. I suspect because he is in afib more at home and this is his baseline rate in afib. No extreme brady or tachy by his home monitoring, continue to follow  3. HTN - stop norvasc and lisinopril due to some low bp's at home - he will call in 1 week with bp log.    F/u 6 monthy  Arnoldo Lenis, M.D.

## 2019-10-28 ENCOUNTER — Telehealth: Payer: Self-pay | Admitting: Cardiology

## 2019-10-28 NOTE — Telephone Encounter (Signed)
Please give pt a call concerning BP readings  [phone]

## 2019-10-29 MED ORDER — AMLODIPINE BESYLATE 2.5 MG PO TABS: 2.5000 mg | ORAL_TABLET | Freq: Every day | ORAL | 11 refills | Status: DC

## 2019-10-29 NOTE — Telephone Encounter (Signed)
Resume very low dose amlodipine 2.5 mg daily and continue to monitor BP's.

## 2019-10-29 NOTE — Telephone Encounter (Signed)
Patient notified and voiced understanding, order placed.

## 2019-10-29 NOTE — Telephone Encounter (Signed)
Pt calling to report BP log 11/16-11/23   99/71   81 121/85   80 135/90   75 136/89   78 168/105 79 140/85   75 165/92   75 158/105  79 Pt states that he feels fine.

## 2019-11-07 ENCOUNTER — Telehealth: Payer: Self-pay | Admitting: Cardiology

## 2019-11-07 NOTE — Telephone Encounter (Signed)
138/97,156/95, 136/79, 147/96, 140/102, 165/105, 135/87, 163/105 

## 2019-11-07 NOTE — Telephone Encounter (Signed)
Calling BP readings in

## 2019-11-10 MED ORDER — AMLODIPINE BESYLATE 5 MG PO TABS: 5.0000 mg | ORAL_TABLET | Freq: Every day | ORAL | 11 refills | Status: DC

## 2019-11-10 NOTE — Telephone Encounter (Signed)
BP too high, start norvasc 5mg  daily and update [place] again in 2 weeks, checking just 3-4 times a week is fine   Zandra Abts MD

## 2019-11-10 NOTE — Telephone Encounter (Signed)
Pt will increase norvasc to 5 mg qd and call back in 2 weeks

## 2019-12-15 ENCOUNTER — Telehealth: Payer: Self-pay | Admitting: Cardiology

## 2019-12-15 NOTE — Telephone Encounter (Signed)
Pt called to report blood pressure readings. He states that one morning he was moving around the house and started to feel "fuzzy headed". Reports BP that morning to be 105/70. All other day pt recorded BP in the evening and are as follows.  126/81 160/100 129/92 131/84 134/85 115/85 144/94 Pt reports that he takes his Norvasc 5 mg in the evening around bedtime. Please advise.

## 2019-12-15 NOTE — Telephone Encounter (Signed)
Please give pt a call concerning BP readings

## 2019-12-16 ENCOUNTER — Ambulatory Visit: Payer: Medicare Other | Admitting: Cardiology

## 2019-12-16 NOTE — Telephone Encounter (Signed)
BP's overall ok given recent bp issues, if sees consistent low bp's have him call us back.   Zandra Abts MD

## 2019-12-17 NOTE — Telephone Encounter (Signed)
Pt notified and voiced understanding 

## 2019-12-31 ENCOUNTER — Ambulatory Visit: Payer: Medicare Other | Admitting: Gastroenterology

## 2019-12-31 ENCOUNTER — Other Ambulatory Visit: Payer: Self-pay

## 2019-12-31 ENCOUNTER — Encounter: Payer: Self-pay | Admitting: Gastroenterology

## 2019-12-31 VITALS — BP 140/82 | HR 73 | Temp 96.6°F | Ht 74.0 in | Wt 229.8 lb

## 2019-12-31 DIAGNOSIS — K921 Melena: Secondary | ICD-10-CM | POA: Insufficient documentation

## 2019-12-31 DIAGNOSIS — R197 Diarrhea, unspecified: Secondary | ICD-10-CM

## 2019-12-31 LAB — CBC WITH DIFFERENTIAL/PLATELET
Absolute Monocytes: 728 cells/uL (ref 200–950)
Basophils Absolute: 41 cells/uL (ref 0–200)
Basophils Relative: 0.6 %
Eosinophils Absolute: 150 cells/uL (ref 15–500)
Eosinophils Relative: 2.2 %
HCT: 45.6 % (ref 38.5–50.0)
Hemoglobin: 15.4 g/dL (ref 13.2–17.1)
Lymphs Abs: 1605 cells/uL (ref 850–3900)
MCH: 31.2 pg (ref 27.0–33.0)
MCHC: 33.8 g/dL (ref 32.0–36.0)
MCV: 92.3 fL (ref 80.0–100.0)
MPV: 11.5 fL (ref 7.5–12.5)
Monocytes Relative: 10.7 %
Neutro Abs: 4277 cells/uL (ref 1500–7800)
Neutrophils Relative %: 62.9 %
Platelets: 184 10*3/uL (ref 140–400)
RBC: 4.94 10*6/uL (ref 4.20–5.80)
RDW: 12.8 % (ref 11.0–15.0)
Total Lymphocyte: 23.6 %
WBC: 6.8 10*3/uL (ref 3.8–10.8)

## 2019-12-31 NOTE — Progress Notes (Signed)
Cc'ed to pcp °

## 2019-12-31 NOTE — Assessment & Plan Note (Signed)
79 year old gentleman with acute onset diarrhea which began 2 weeks ago.  He was on antibiotics back in December.  Would be concerned about C. difficile colitis.  Wife developed diarrhea yesterday.  No other ill contacts.  Patient is concerned about his stools being black although suspect this was related to Pepto-Bismol use.  From a clinical standpoint he does not appear to have significant anemia.  Check CBC, C. difficile GDH antigen, GI pathogen panel.  Further recommendations to follow.

## 2019-12-31 NOTE — Progress Notes (Signed)
Primary Care Physician:  Celene Squibb, MD  Primary Gastroenterologist:  Garfield Cornea, MD   Chief Complaint  Patient presents with  . Diarrhea    x 2 weeks,black stools,Pepto and Imodium not working    HPI:  Jonathan Greene is a 79 y.o. male here for further evaluation of diarrhea.  Patient last seen in July 2017.  He has a history of radiation proctitis, underwent APC treatment back in 2015 for rectal bleeding.  Patient states he developed acute onset diarrhea about 2 weeks ago.  At baseline he is having regular solid stool.  Now having 2-3 soft to watery stools daily.  No red blood noted.  No abdominal pain.  Associated with fecal urgency.  He has been self-medicating with Pepto-Bismol.  He noted his stools turned black after that.  Last dose was yesterday.  His wife started having diarrhea yesterday.  He has had no other symptoms such as cough, congestion, fever.  He was given antibiotics back in December for an elbow infection, Keflex.  Rare heartburn.  No unintentional weight loss.  Remains on Xarelto for A. Fib.  We discussed surveillance colonoscopies not required given his age.  He was happy to hear this as he does not want pursuing additional colonoscopy unless he needs it.  Current Outpatient Medications  Medication Sig Dispense Refill  . amLODipine (NORVASC) 5 MG tablet Take 1 tablet (5 mg total) by mouth daily. 30 tablet 11  . b complex vitamins tablet Take 1 tablet by mouth daily.    Marland Kitchen escitalopram (LEXAPRO) 10 MG tablet Take 10 mg by mouth daily.    . Melatonin 10 MG CAPS Take by mouth at bedtime.    Marland Kitchen omeprazole (PRILOSEC) 20 MG capsule Take 20 mg by mouth daily.     . rivaroxaban (XARELTO) 20 MG TABS tablet Take 1 tablet (20 mg total) by mouth daily with supper. 90 tablet 4  . simethicone (GAS-X) 80 MG chewable tablet Chew 80 mg by mouth once as needed for flatulence. Reported on 06/20/2016    . simvastatin (ZOCOR) 40 MG tablet Take 1 tablet by mouth at bedtime.     .  solifenacin (VESICARE) 10 MG tablet Take 1 tablet by mouth daily.     No current facility-administered medications for this visit.    Allergies as of 12/31/2019  . (No Known Allergies)    Past Medical History:  Diagnosis Date  . A-fib ([place])   . Bradycardia   . COPD (chronic obstructive pulmonary disease) ([place])   . Frequent urination   . GERD (gastroesophageal reflux disease)   . Hypercholesterolemia   . Hypertension   . Prostate cancer ([place])    RADIATION [place] IMPLANTS 02-28-12  . Sleep apnea   . Stroke ([place])    2011;ST memory loss, no other deficits.  . Urinary urgency     Past Surgical History:  Procedure Laterality Date  . CATARACT EXTRACTION W/PHACO Right 11/06/2013   Procedure: CATARACT EXTRACTION PHACO AND INTRAOCULAR LENS PLACEMENT ([place]);  Surgeon: Tonny Branch, MD;  Location: [facility];  Service: Ophthalmology;  Laterality: Right;  CDE 11.67  . CATARACT EXTRACTION W/PHACO Left 11/25/2015   Procedure: CATARACT EXTRACTION PHACO AND INTRAOCULAR LENS PLACEMENT ; CDE:  8.15;  Surgeon: Tonny Branch, MD;  Location: [facility];  Service: Ophthalmology;  Laterality: Left;  . COLONOSCOPY  10/03/2006   [id number] rectal polyps cold biopsied/removed, otherwise normal rectum/Sigmoid diverticula.  Remainder of colon mucosa appeared normal  . COLONOSCOPY  N/A 08/19/2014   RMR: Radiation proctitis-status post APC ablation. Colonic diverticulosis.  . CYSTOSCOPY  02/28/2012   Procedure: CYSTOSCOPY;  Surgeon: Bernestine Amass, MD;  Location: [facility];  Service: Urology;  Laterality: N/A;   no seeds noted in bladder  . RADIOACTIVE SEED IMPLANT  02/28/2012   Procedure: RADIOACTIVE SEED IMPLANT;  Surgeon: Bernestine Amass, MD;  Location: [facility];  Service: Urology;  Laterality: N/A;  67seeds implanted     Family History  Problem Relation Age of Onset  . Stroke Mother   . Cancer Neg Hx     Social History   Socioeconomic History  . Marital  status: Married    Spouse name: Not on file  . Number of children: Not on file  . Years of education: Not on file  . Highest education level: Not on file  Occupational History  . Not on file  Tobacco Use  . Smoking status: Former Smoker    Packs/day: 2.00    Years: 35.00    Pack years: 70.00    Types: Cigarettes    Quit date: 02/21/1991    Years since quitting: 28.8  . Smokeless tobacco: Current User    Types: Chew    Last attempt to quit: 03/11/2016  . Tobacco comment: CHEW TOBACCO FOR 20 YRS   Substance and Sexual Activity  . Alcohol use: Yes    Alcohol/week: 14.0 standard drinks    Types: 14 Shots of liquor per week    Comment: 4 ounces daily  . Drug use: No  . Sexual activity: Yes    Birth control/protection: None  Other Topics Concern  . Not on file  Social History Narrative  . Not on file   Social Determinants of Health   Financial Resource Strain:   . Difficulty of Paying Living Expenses: Not on file  Food Insecurity:   . Worried About Charity fundraiser in the Last Year: Not on file  . Ran Out of Food in the Last Year: Not on file  Transportation Needs:   . Lack of Transportation (Medical): Not on file  . Lack of Transportation (Non-Medical): Not on file  Physical Activity:   . Days of Exercise per Week: Not on file  . Minutes of Exercise per Session: Not on file  Stress:   . Feeling of Stress : Not on file  Social Connections:   . Frequency of Communication with Friends and Family: Not on file  . Frequency of Social Gatherings with Friends and Family: Not on file  . Attends Religious Services: Not on file  . Active Member of Clubs or Organizations: Not on file  . Attends Archivist Meetings: Not on file  . Marital Status: Not on file  Intimate Partner Violence:   . Fear of Current or Ex-Partner: Not on file  . Emotionally Abused: Not on file  . Physically Abused: Not on file  . Sexually Abused: Not on file      ROS:  General: Negative  for anorexia, weight loss, fever, chills, fatigue, weakness. Eyes: Negative for vision changes.  ENT: Negative for hoarseness, difficulty swallowing , nasal congestion. CV: Negative for chest pain, angina, palpitations, dyspnea on exertion, peripheral edema.  Respiratory: Negative for dyspnea at rest, dyspnea on exertion, cough, sputum, wheezing.  GI: See history of present illness. GU:  Negative for dysuria, hematuria, urinary incontinence, urinary frequency, nocturnal urination.  MS: Negative for joint pain, low back pain.  Derm: Negative  for rash or itching.  Neuro: Negative for weakness, abnormal sensation, seizure, frequent headaches, memory loss, confusion.  Psych: Negative for anxiety, depression, suicidal ideation, hallucinations.  Endo: Negative for unusual weight change.  Heme: Negative for bruising or bleeding. Allergy: Negative for rash or hives.    Physical Examination:  BP 140/82 (BP Location: Left Arm, Cuff Size: Large)   Pulse 73   Temp (!) 96.6 F (35.9 C)   Ht 6\' 2"  (1.88 m)   Wt 229 lb 12.8 oz (104.2 kg)   BMI 29.50 kg/m    General: Well-nourished, well-developed in no acute distress.  Head: Normocephalic, atraumatic.   Eyes: Conjunctiva pink, no icterus. Mouth:masked Neck: Supple without thyromegaly, masses, or lymphadenopathy.  Lungs: Clear to auscultation bilaterally.  Heart: Regular rate and rhythm, no murmurs rubs or gallops.  Abdomen: Bowel sounds are normal, nontender, nondistended, no hepatosplenomegaly or masses, no abdominal bruits or    hernia , no rebound or guarding.   Rectal: Not performed Extremities: No lower extremity edema. No clubbing or deformities.  Neuro: Alert and oriented x 4 , grossly normal neurologically.  Skin: Warm and dry, no rash or jaundice.   Psych: Alert and cooperative, normal mood and affect.    Imaging Studies: No results found.

## 2019-12-31 NOTE — Patient Instructions (Signed)
1. Please go to lab today for blood work and pick up stool specimen kit. We will contact you with results as soon as available.  2. If you see red blood in stools, have lightheadedness, dizziness, please let us know right away or go to the ER.  3. I suspect your black stools are due to Pepto. Consider holding for now until we get your lab results back.

## 2020-01-05 LAB — C. DIFFICILE GDH AND TOXIN A/B
GDH ANTIGEN: NOT DETECTED
MICRO NUMBER:: 10093126
SPECIMEN QUALITY:: ADEQUATE
TOXIN A AND B: NOT DETECTED

## 2020-01-05 LAB — GASTROINTESTINAL PATHOGEN PANEL PCR
C. difficile Tox A/B, PCR: NOT DETECTED
Campylobacter, PCR: NOT DETECTED
Cryptosporidium, PCR: NOT DETECTED
E coli (ETEC) LT/ST PCR: NOT DETECTED
E coli (STEC) stx1/stx2, PCR: NOT DETECTED
E coli 0157, PCR: NOT DETECTED
Giardia lamblia, PCR: NOT DETECTED
Norovirus, PCR: NOT DETECTED
Rotavirus A, PCR: NOT DETECTED
Salmonella, PCR: NOT DETECTED
Shigella, PCR: NOT DETECTED

## 2020-05-12 ENCOUNTER — Other Ambulatory Visit: Payer: Self-pay | Admitting: Internal Medicine

## 2020-05-12 ENCOUNTER — Ambulatory Visit (HOSPITAL_COMMUNITY)
Admission: RE | Admit: 2020-05-12 | Discharge: 2020-05-12 | Disposition: A | Discharge status: Home / Self Care | Payer: Medicare Other | Source: Ambulatory Visit | Attending: Adult Health Nurse Practitioner | Admitting: Adult Health Nurse Practitioner

## 2020-05-12 ENCOUNTER — Other Ambulatory Visit (HOSPITAL_COMMUNITY): Payer: Self-pay | Admitting: Adult Health Nurse Practitioner

## 2020-05-12 ENCOUNTER — Other Ambulatory Visit: Payer: Self-pay

## 2020-05-12 ENCOUNTER — Ambulatory Visit (HOSPITAL_COMMUNITY)
Admission: RE | Admit: 2020-05-12 | Discharge: 2020-05-12 | Disposition: A | Discharge status: Home / Self Care | Payer: Medicare Other | Source: Ambulatory Visit | Attending: Internal Medicine | Admitting: Internal Medicine

## 2020-05-12 ENCOUNTER — Encounter (HOSPITAL_COMMUNITY): Payer: Self-pay

## 2020-05-12 DIAGNOSIS — W19XXXA Unspecified fall, initial encounter: Secondary | ICD-10-CM | POA: Diagnosis not present

## 2020-05-12 DIAGNOSIS — R609 Edema, unspecified: Secondary | ICD-10-CM | POA: Insufficient documentation

## 2020-05-12 DIAGNOSIS — I700 Atherosclerosis of aorta: Secondary | ICD-10-CM | POA: Diagnosis not present

## 2020-05-12 DIAGNOSIS — R042 Hemoptysis: Secondary | ICD-10-CM

## 2020-05-12 DIAGNOSIS — R05 Cough: Secondary | ICD-10-CM | POA: Diagnosis present

## 2020-05-12 DIAGNOSIS — R059 Cough, unspecified: Secondary | ICD-10-CM

## 2020-05-12 DIAGNOSIS — M25531 Pain in right wrist: Secondary | ICD-10-CM

## 2020-05-12 DIAGNOSIS — J439 Emphysema, unspecified: Secondary | ICD-10-CM | POA: Diagnosis not present

## 2020-05-28 ENCOUNTER — Other Ambulatory Visit: Payer: Self-pay | Admitting: Internal Medicine

## 2020-05-28 ENCOUNTER — Other Ambulatory Visit (HOSPITAL_COMMUNITY): Payer: Self-pay | Admitting: Internal Medicine

## 2020-05-28 DIAGNOSIS — R0602 Shortness of breath: Secondary | ICD-10-CM

## 2020-06-03 ENCOUNTER — Other Ambulatory Visit: Payer: Self-pay

## 2020-06-03 ENCOUNTER — Ambulatory Visit (HOSPITAL_COMMUNITY)
Admission: RE | Admit: 2020-06-03 | Discharge: 2020-06-03 | Disposition: A | Discharge status: Home / Self Care | Payer: Medicare Other | Source: Ambulatory Visit | Attending: Internal Medicine | Admitting: Internal Medicine

## 2020-06-03 DIAGNOSIS — R0602 Shortness of breath: Secondary | ICD-10-CM

## 2020-06-08 ENCOUNTER — Other Ambulatory Visit: Payer: Self-pay

## 2020-06-08 ENCOUNTER — Inpatient Hospital Stay (HOSPITAL_COMMUNITY): Payer: Medicare Other | Attending: Hematology | Admitting: Hematology

## 2020-06-08 ENCOUNTER — Other Ambulatory Visit (HOSPITAL_COMMUNITY): Payer: Self-pay | Admitting: Internal Medicine

## 2020-06-08 ENCOUNTER — Encounter (HOSPITAL_COMMUNITY): Payer: Self-pay | Admitting: *Deleted

## 2020-06-08 ENCOUNTER — Encounter (HOSPITAL_COMMUNITY): Payer: Self-pay | Admitting: Hematology

## 2020-06-08 VITALS — BP 115/84 | HR 57 | Temp 97.8°F | Resp 18 | Ht 74.5 in | Wt 224.0 lb

## 2020-06-08 DIAGNOSIS — R918 Other nonspecific abnormal finding of lung field: Secondary | ICD-10-CM | POA: Diagnosis not present

## 2020-06-08 DIAGNOSIS — Z79899 Other long term (current) drug therapy: Secondary | ICD-10-CM | POA: Insufficient documentation

## 2020-06-08 DIAGNOSIS — I4891 Unspecified atrial fibrillation: Secondary | ICD-10-CM | POA: Diagnosis not present

## 2020-06-08 DIAGNOSIS — I712 Thoracic aortic aneurysm, without rupture: Secondary | ICD-10-CM | POA: Diagnosis not present

## 2020-06-08 DIAGNOSIS — Z7901 Long term (current) use of anticoagulants: Secondary | ICD-10-CM | POA: Insufficient documentation

## 2020-06-08 DIAGNOSIS — Z77090 Contact with and (suspected) exposure to asbestos: Secondary | ICD-10-CM | POA: Insufficient documentation

## 2020-06-08 DIAGNOSIS — Z87891 Personal history of nicotine dependence: Secondary | ICD-10-CM | POA: Diagnosis not present

## 2020-06-08 DIAGNOSIS — I10 Essential (primary) hypertension: Secondary | ICD-10-CM | POA: Diagnosis not present

## 2020-06-08 DIAGNOSIS — Z8701 Personal history of pneumonia (recurrent): Secondary | ICD-10-CM | POA: Insufficient documentation

## 2020-06-08 DIAGNOSIS — G4730 Sleep apnea, unspecified: Secondary | ICD-10-CM | POA: Insufficient documentation

## 2020-06-08 DIAGNOSIS — K219 Gastro-esophageal reflux disease without esophagitis: Secondary | ICD-10-CM | POA: Insufficient documentation

## 2020-06-08 DIAGNOSIS — J449 Chronic obstructive pulmonary disease, unspecified: Secondary | ICD-10-CM | POA: Diagnosis not present

## 2020-06-08 DIAGNOSIS — J61 Pneumoconiosis due to asbestos and other mineral fibers: Secondary | ICD-10-CM | POA: Diagnosis not present

## 2020-06-08 DIAGNOSIS — E7800 Pure hypercholesterolemia, unspecified: Secondary | ICD-10-CM | POA: Diagnosis not present

## 2020-06-08 DIAGNOSIS — R059 Cough, unspecified: Secondary | ICD-10-CM

## 2020-06-08 NOTE — Progress Notes (Signed)
I called patient after the visit today. I explained that I was not able to meet with him during the visit.  He is appreciative of my call.  He had questions about PET scan and MRI and why there was a need for both. I explained the general process of the two scans and reason for need.  He didn't have any further questions at this time.  I explained to him that we will meet in person at his next follow up visit.

## 2020-06-08 NOTE — Progress Notes (Signed)
[facility] [address], [place] [address]   CLINIC:  Medical Oncology/Hematology  CONSULT NOTE  Patient Care Team: Celene Squibb, MD as PCP - General (Internal Medicine) Harl Bowie, Alphonse Guild, MD as PCP - Cardiology (Cardiology) Gala Romney Cristopher Estimable, MD as Consulting Physician (Gastroenterology)  CHIEF COMPLAINTS/PURPOSE OF CONSULTATION:  Evaluation of left upper lobe lung mass  HISTORY OF PRESENTING ILLNESS:  [name] 79 y.o. male is here because of evaluation of left upper lobe lung mass on CT chest, at the request of Dr. Allyn Kenner. He had the CT scan of chest done on 06/03/2020 after complaining of SOB for 1 month as well as an abnormal shadow from a previous CXR when he was diagnosed with PNA the last week of 04/2020.  Today he is accompanied by his wife. He reports that his lawyers from [place] have informed him of his asbestos exposure from his work, but he is not followed by any doctor for it. He denies any abnormal weight loss, but has been having recent SOB over the past year. He denies any headaches or vision changes and is able to do his daily activities, though he has been feeling more fatigued and SOB lately. He denies any history of MI or strokes or DVT, but has A-fib on Xarelto.  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 He stopped smoking at age 55, smoking 1-3 PPD for 33 years. He quit since it was giving him SOB, but since he quit it has resolved. Sister is deceased from breast and colon cancer.  MEDICAL HISTORY:  Past Medical History:  Diagnosis Date  . A-fib ([place])   . Bradycardia   . COPD (chronic obstructive pulmonary disease) ([place])   . Frequent urination   . GERD (gastroesophageal reflux disease)   . Hypercholesterolemia   . Hypertension   . Prostate cancer ([place])    RADIATION [employer] IMPLANTS 02-28-12  . Sleep apnea   . Stroke ([place])    2011;ST memory loss, no other  deficits.  . Urinary urgency     SURGICAL HISTORY: Past Surgical History:  Procedure Laterality Date  . CATARACT EXTRACTION W/PHACO Right 11/06/2013   Procedure: CATARACT EXTRACTION PHACO AND INTRAOCULAR LENS PLACEMENT ([place]);  Surgeon: Tonny Branch, MD;  Location: [facility];  Service: Ophthalmology;  Laterality: Right;  CDE 11.67  . CATARACT EXTRACTION W/PHACO Left 11/25/2015   Procedure: CATARACT EXTRACTION PHACO AND INTRAOCULAR LENS PLACEMENT ; CDE:  8.15;  Surgeon: Tonny Branch, MD;  Location: [facility];  Service: Ophthalmology;  Laterality: Left;  . COLONOSCOPY  10/03/2006   CLE:[id number] rectal polyps cold biopsied/removed, otherwise normal rectum/Sigmoid diverticula.  Remainder of colon mucosa appeared normal  . COLONOSCOPY N/A 08/19/2014   RMR: Radiation proctitis-status post APC ablation. Colonic diverticulosis.  . CYSTOSCOPY  02/28/2012   Procedure: CYSTOSCOPY;  Surgeon: Bernestine Amass, MD;  Location: [facility];  Service: Urology;  Laterality: N/A;   no seeds noted in bladder  . RADIOACTIVE SEED IMPLANT  02/28/2012   Procedure: RADIOACTIVE SEED IMPLANT;  Surgeon: Bernestine Amass, MD;  Location: [facility];  Service: Urology;  Laterality: N/A;  67seeds implanted     SOCIAL HISTORY: Social History   Socioeconomic History  . Marital status: Married    Spouse name: Vaughan Basta  . Number of children: 2  . Years of education: Not on file  . Highest education level: Not  on file  Occupational History  . Occupation: retired  Tobacco Use  . Smoking status: Former Smoker    Packs/day: 2.00    Years: 35.00    Pack years: 70.00    Types: Cigarettes    Quit date: 02/21/1991    Years since quitting: 29.3  . Smokeless tobacco: Current User    Types: Chew    Last attempt to quit: 03/11/2016  . Tobacco comment: CHEW TOBACCO FOR 20 YRS   Substance and Sexual Activity  . Alcohol use: Yes    Alcohol/week: 14.0 standard drinks    Types: 14 Shots of liquor per week     Comment: 4 ounces daily  . Drug use: No  . Sexual activity: Yes    Birth control/protection: None  Other Topics Concern  . Not on file  Social History Narrative  . Not on file   Social Determinants of Health   Financial Resource Strain: Low Risk   . Difficulty of Paying Living Expenses: Not hard at all  Food Insecurity: No Food Insecurity  . Worried About Charity fundraiser in the Last Year: Never true  . Ran Out of Food in the Last Year: Never true  Transportation Needs: No Transportation Needs  . Lack of Transportation (Medical): No  . Lack of Transportation (Non-Medical): No  Physical Activity:   . Days of Exercise per Week:   . Minutes of Exercise per Session:   Stress: No Stress Concern Present  . Feeling of Stress : Not at all  Social Connections: Moderately Isolated  . Frequency of Communication with Friends and Family: More than three times a week  . Frequency of Social Gatherings with Friends and Family: More than three times a week  . Attends Religious Services: Never  . Active Member of Clubs or Organizations: No  . Attends Archivist Meetings: Never  . Marital Status: Married  Human resources officer Violence: Not At Risk  . Fear of Current or Ex-Partner: No  . Emotionally Abused: No  . Physically Abused: No  . Sexually Abused: No    FAMILY HISTORY: Family History  Problem Relation Age of Onset  . Stroke Mother   . Hypertension Mother   . Colon cancer Sister        in her 15s  . Breast cancer Sister   . Heart attack Brother   . COPD Maternal Aunt   . COPD Sister   . Hypertension Sister   . Pneumonia Sister   . COPD Son   . Cancer Neg Hx     ALLERGIES:  has No Known Allergies.  MEDICATIONS:  Current Outpatient Medications  Medication Sig Dispense Refill  . amLODipine (NORVASC) 5 MG tablet Take 1 tablet (5 mg total) by mouth daily. (Patient taking differently: Take 2.5 mg by mouth at bedtime. ) 30 tablet 11  . b complex vitamins tablet Take  1 tablet by mouth daily.    Marland Kitchen escitalopram (LEXAPRO) 10 MG tablet Take 10 mg by mouth daily.    . Melatonin 10 MG CAPS Take by mouth at bedtime.    Marland Kitchen omeprazole (PRILOSEC) 20 MG capsule Take 20 mg by mouth daily.     . rivaroxaban (XARELTO) 20 MG TABS tablet Take 1 tablet (20 mg total) by mouth daily with supper. 90 tablet 4  . simvastatin (ZOCOR) 40 MG tablet Take 1 tablet by mouth at bedtime.     . solifenacin (VESICARE) 10 MG tablet Take 1 tablet by mouth daily.    Marland Kitchen  simethicone (GAS-X) 80 MG chewable tablet Chew 80 mg by mouth once as needed for flatulence. Reported on 06/20/2016 (Patient not taking: Reported on 06/08/2020)     No current facility-administered medications for this visit.    REVIEW OF SYSTEMS:   Review of Systems  Constitutional: Positive for fatigue (severe). Negative for appetite change and unexpected weight change.  Eyes: Negative for eye problems.  Respiratory: Positive for cough and shortness of breath (w/ exertion).   Genitourinary: Positive for difficulty urinating (hesitancy).   Neurological: Positive for dizziness. Negative for headaches.  All other systems reviewed and are negative.    PHYSICAL EXAMINATION: ECOG PERFORMANCE STATUS: 1 - Symptomatic but completely ambulatory  Vitals:   06/08/20 1343  BP: 115/84  Pulse: (!) 57  Resp: 18  Temp: 97.8 F (36.6 C)  SpO2: 98%   Filed Weights   06/08/20 1343  Weight: 101.6 kg (224 lb)   Physical Exam Vitals reviewed.  Constitutional:      Appearance: Normal appearance.  Cardiovascular:     Rate and Rhythm: Normal rate and regular rhythm.     Pulses: Normal pulses.     Heart sounds: Normal heart sounds.  Pulmonary:     Effort: Pulmonary effort is normal.     Breath sounds: Normal breath sounds.  Abdominal:     Palpations: Abdomen is soft. There is no hepatomegaly, splenomegaly or mass.     Tenderness: There is no abdominal tenderness.  Musculoskeletal:     Cervical back: Neck supple.    Lymphadenopathy:     Cervical: No cervical adenopathy.     Upper Body:     Right upper body: No supraclavicular or axillary adenopathy.     Left upper body: No supraclavicular or axillary adenopathy.     Lower Body: No right inguinal adenopathy. No left inguinal adenopathy.  Neurological:     General: No focal deficit present.     Mental Status: He is alert and oriented to person, place, and time.  Psychiatric:        Mood and Affect: Mood normal.        Behavior: Behavior normal.      LABORATORY DATA:  I have reviewed the data as listed CBC Latest Ref Rng & Units 12/31/2019 01/20/2017 02/29/2016  WBC 3.8 - 10.8 Thousand/uL 6.8 11.6(H) 9.5  Hemoglobin 13.2 - 17.1 g/dL 15.4 15.2 17.1(H)  Hematocrit 38 - 50 % 45.6 45.7 49.5  Platelets 140 - 400 Thousand/uL 184 165 170   CMP Latest Ref Rng & Units 01/20/2017 02/29/2016 06/09/2015  Glucose 65 - 99 mg/dL 154(H) 111(H) -  BUN 6 - 20 mg/dL 18 32(H) -  Creatinine 0.61 - 1.24 mg/dL 1.25(H) 1.63(H) 1.30(H)  Sodium 135 - 145 mmol/L 139 135 -  Potassium 3.5 - 5.1 mmol/L 4.4 3.5 -  Chloride 101 - 111 mmol/L 103 104 -  CO2 22 - 32 mmol/L 28 21(L) -  Calcium 8.9 - 10.3 mg/dL 9.6 8.5(L) -  Total Protein 6.5 - 8.1 g/dL 7.5 8.1 -  Total Bilirubin 0.3 - 1.2 mg/dL 0.9 0.9 -  Alkaline Phos 38 - 126 U/L 69 73 -  AST 15 - 41 U/L 55(H) 63(H) -  ALT 17 - 63 U/L 42 64(H) -    RADIOGRAPHIC STUDIES: I have personally reviewed the radiological images as listed and agreed with the findings in the report. DG Chest 2 View  Result Date: 05/12/2020 CLINICAL DATA:  Cough for 1 month.  Blood in sputum. EXAM: CHEST -  2 VIEW COMPARISON:  Single-view of the chest 01/28/2015. CT chest 06/09/2015. FINDINGS: The lungs are emphysematous. There is airspace disease in the left upper lobe. The right lung is clear. Heart size is normal. Atherosclerosis noted. No pneumothorax or pleural effusion. IMPRESSION: Left upper lobe airspace disease is likely due to pneumonia but  repeat plain films to clearing are recommended to exclude neoplasm. 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 Electronically Signed   By: Inge Rise M.D.   On: 05/12/2020 13:48   DG Wrist Complete Right  Result Date: 05/12/2020 CLINICAL DATA:  Right wrist pain for 1 year. EXAM: RIGHT WRIST - COMPLETE 3+ VIEW COMPARISON:  None. FINDINGS: Moderate degenerative changes mainly along the ulnar aspect of the wrist with joint space narrowing and mild bony eburnation. The lunate is mainly articulating with the ulnar aspect of the radius and the ulna. Moderate degenerative changes also noted at the [facility] joint of the thumb and at the navicular bone articulation with the trapezoid and trapezium. Uniform joint space narrowing at the third MCP joint. No acute fracture is identified. IMPRESSION: Moderate degenerative changes but no acute bony findings. Electronically Signed   By: Marijo Sanes M.D.   On: 05/12/2020 18:44   CT CHEST WO CONTRAST  Result Date: 06/03/2020 CLINICAL DATA:  79 year old male with shortness of breath. EXAM: CT CHEST WITHOUT CONTRAST TECHNIQUE: Multidetector CT imaging of the chest was performed following the standard protocol without IV contrast. COMPARISON:  Chest radiograph dated 05/12/2020 and chest CT dated 06/09/2015. FINDINGS: Evaluation of this exam is limited in the absence of intravenous contrast. Cardiovascular: There is no cardiomegaly or pericardial effusion. Three-vessel coronary vascular calcification. There is moderate atherosclerotic calcification of the thoracic aorta. The ascending aorta measures 4.3 cm in diameter. The central pulmonary arteries are grossly unremarkable. Mediastinum/Nodes: There is no hilar or mediastinal adenopathy. The esophagus is grossly unremarkable. No mediastinal fluid collection. Lungs/Pleura: Severe emphysema. There is a 2.3 x 2.2 x 4.4 cm subpleural masslike consolidation in the left upper lobe corresponding to the density  seen on the prior radiograph. This is not characterized on this CT and may represent pneumonia or a neoplastic process. Clinical correlation and close follow-up recommended. Pulmonary consult/referral is advised. There is no pleural effusion or pneumothorax. The central airways are patent. Upper Abdomen: No acute abnormality. Musculoskeletal: Degenerative changes of the spine. No acute osseous pathology. IMPRESSION: 1. Left upper lobe subpleural consolidation versus mass. Clinical correlation and close follow-up recommended. 2. 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 Recommend annual imaging followup by CTA or MRA. This recommendation follows 2010 ACCF/AHA/AATS/ACR/ASA/SCA/SCAI/SIR/STS/SVM Guidelines for the Diagnosis and Management of Patients with Thoracic Aortic Disease. Circulation. 2010; 121: Z610-R604. Aortic aneurysm NOS (ICD10-I71.9) 3. 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 Electronically Signed   By: Anner Crete M.D.   On: 06/03/2020 19:25    ASSESSMENT:  1.  Left upper lobe lung mass: -Recent history of pneumonia followed by abnormal chest x-ray findings. -CT of the chest without contrast on 06/03/2020 showed 2.3 x 2.2 x 4.4 cm subpleural mass like consolidation in the left upper lobe corresponding to the density seen on the prior radiograph. -Patient smoked for 33 years, quit smoking at age 32.  He smoked on average of 1 pack/day for most years and at the time of quitting, was smoking 3 packs/day of cigarettes. 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  He reports asbestos exposure throughout his career. 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  2.  Ascending aortic aneurysm: -4.3 cm aneurysmal dilatation of the ascending aorta was noted on the current CT scan. -Annual imaging by CTA or MRA was recommended.   PLAN:  1.  Left upper lobe  lung mass: -I have a reviewed CT scan images with the patient and his wife. -He has an appointment to see pulmonary tomorrow morning. -I have recommended a PET CT scan and brain MRI for staging. -He will need biopsy of the PET positive lesion. -I will see him back after the PET scan and brain MRI.  2.  Worsening shortness of breath: -He reported shortness of breath worsening for the past 1 year. -He has a follow-up with pulmonary tomorrow.  All questions were answered. The patient knows to call the clinic with any problems, questions or concerns.   Derek Jack, MD, 06/08/20 2:31 PM  [place] [phone]   I, Milinda Antis, am acting as a scribe for Dr. Sanda Linger.  I, Derek Jack MD, have reviewed the above documentation for accuracy and completeness, and I agree with the above.

## 2020-06-08 NOTE — Patient Instructions (Signed)
[place] at [facility] Discharge Instructions  You were seen today by Dr. Delton Coombes. He went over your history,family history and your recent scan results. He will schedule you for a PET scan and an MRI of your brain. He will likely schedule you for a biopsy depending on your PET scan results. He will see you back after your sans for follow up.   Thank you for choosing [place] at [facility] to provide your oncology and hematology care.  To afford each patient quality time with our provider, please arrive at least 15 minutes before your scheduled appointment time.   If you have a lab appointment with the [place] please come in thru the  Main Entrance and check in at the main information desk  You need to re-schedule your appointment should you arrive 10 or more minutes late.  We strive to give you quality time with our providers, and arriving late affects you and other patients whose appointments are after yours.  Also, if you no show three or more times for appointments you may be dismissed from the clinic at the providers discretion.     Again, thank you for choosing [facility].  Our hope is that these requests will decrease the amount of time that you wait before being seen by our physicians.       _____________________________________________________________  Should you have questions after your visit to [facility], please contact our office at [phone] between the hours of 8:00 a.m. and 4:30 p.m.  Voicemails left after 4:00 p.m. will not be returned until the following business day.  For prescription refill requests, have your pharmacy contact our office and allow 72 hours.    Cancer Center Support Programs:   > Cancer Support Group  2nd Tuesday of the month 1pm-2pm, Journey Room

## 2020-06-09 ENCOUNTER — Ambulatory Visit (HOSPITAL_COMMUNITY): Payer: Medicare Other | Admitting: Hematology

## 2020-06-09 ENCOUNTER — Ambulatory Visit: Payer: Medicare Other | Admitting: Pulmonary Disease

## 2020-06-09 ENCOUNTER — Encounter: Payer: Self-pay | Admitting: Pulmonary Disease

## 2020-06-09 VITALS — BP 138/86 | HR 74 | Temp 96.8°F | Ht 74.0 in | Wt 224.0 lb

## 2020-06-09 DIAGNOSIS — J432 Centrilobular emphysema: Secondary | ICD-10-CM

## 2020-06-09 DIAGNOSIS — G4733 Obstructive sleep apnea (adult) (pediatric): Secondary | ICD-10-CM

## 2020-06-09 DIAGNOSIS — R918 Other nonspecific abnormal finding of lung field: Secondary | ICD-10-CM

## 2020-06-09 DIAGNOSIS — Z9989 Dependence on other enabling machines and devices: Secondary | ICD-10-CM

## 2020-06-09 DIAGNOSIS — Z77090 Contact with and (suspected) exposure to asbestos: Secondary | ICD-10-CM

## 2020-06-09 MED ORDER — ALBUTEROL SULFATE HFA 108 (90 BASE) MCG/ACT IN AERS
2.0000 | INHALATION_SPRAY | Freq: Four times a day (QID) | RESPIRATORY_TRACT | 5 refills | Status: DC | PRN
Start: 2020-06-09 — End: 2022-10-06

## 2020-06-09 NOTE — Patient Instructions (Addendum)
Albuterol two puffs every 6 hours as needed for cough, wheeze, chest congestion, or shortness of breath  Will arrange for pulmonary function test  Call our office after you have your PET scan done so I can review the images and coordinate with Dr. Delton Coombes about whether you need a biopsy  Follow up in 4 weeks

## 2020-06-09 NOTE — Progress Notes (Signed)
[facility]  Chief Complaint  Patient presents with  . Pulmonary Consult    Referred by Dr Allyn Kenner for eval of lung mass on CT Chest 06/03/20- pt states CT done to evaluate trouble with breathing that started 5 years ago and has progressively worsened over the past year. He states also had hemoptysis approx 6 wks ago and was txed for PNA.     Constitutional:  BP 138/86 (BP Location: Left Arm, Cuff Size: Normal)   Pulse 74   Temp (!) 96.8 F (36 C) (Oral)   Ht 6\' 2"  (1.88 m)   Wt 224 lb (101.6 kg)   SpO2 99% Comment: on RA  BMI 28.76 kg/m   Past Medical History:  A fib, GERD, HLD, HTN, Prostate cancer  Summary:  Jonathan Greene is a 79 y.o. male former smoker with dyspnea, cough, COPD, OSA, Lt lung mass, and history of asbestos exposure.  Subjective:   Previously seen by Dr. Luan Pulling.    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  He has noticed trouble with his breathing for years.  This was worse in June 2021.  He also had cough and had episode of coughing blood.  He was told he had pneumonia based on a chest xray and was treated for this.  He then had CT chest on July 1 and found to have left upper lung peripheral based mass like density.  He was seen by Dr. Delton Coombes with oncology and has PET scan and MRI brain scheduled.  He is no longer coughing up blood.  He denies chest pain.  No fever, sweats, weight loss, gland swelling, hoarseness, skin rash or leg swelling.     Physical Exam:   Appearance - well kempt  ENMT - no sinus tenderness, no nasal discharge, no oral exudate, Mallampati 2, wears dentures  Respiratory - no wheeze, or rales  CV - regular rate and rhythm, no murmurs  GI - soft, non tender  Lymph - no adenopathy noted in neck  Ext - no edema  Skin - no rashes  Neuro - normal strength, oriented x 3  Psych - normal mood and affect   Assessment/Plan:   Recent history of  pneumonia with Lt upper lobe mass like consolidation seen on CT chest 06/03/20. - he has PET scan scheduled through Dr. 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 - prn albuterol - will arrange for pulmonary function testing  Obstructive sleep apnea. - he reports compliance. - will get copy of his CPAP download  History of asbestos exposure. - worked as a Engineer, civil (consulting) fibrillation, ascending thoracic aortic aneurysm. - followed by Dr. Harl Bowie with cardiology   A total of  48 minutes spent addressing patient care issues on day of visit.  Follow up:  Patient Instructions  Albuterol two puffs every 6 hours as needed for cough, wheeze, chest congestion, or shortness of breath  Will arrange for pulmonary function test  Call our office after you have your PET scan done so I can review the images and coordinate with Dr. Delton Coombes about whether you need a biopsy  Follow up in 4 weeks   Signature:  Chesley Mires, MD [place] Pager: [phone] 06/09/2020, 12:07 PM  Flow Sheet     Pulmonary tests:    Chest imaging:  CT chest 06/03/20 >> atherosclerosis, aortic root 4.3 cm, severe centrilobular emphysema, LUL subpleural mass like consolidation 4.4 x 2.3 x 2.2 cm  Medications:   Allergies as of 06/09/2020   No Known Allergies     Medication List       Accurate as of June 09, 2020 12:07 PM. If you have any questions, ask your nurse or doctor.        albuterol 108 (90 Base) MCG/ACT inhaler Commonly known as: VENTOLIN HFA Inhale 2 puffs into the lungs every 6 (six) hours as needed for wheezing or shortness of breath. Started by: Chesley Mires, MD   amLODipine 5 MG tablet Commonly known as: NORVASC Take 1 tablet (5 mg total) by mouth daily. What changed:   how much to take  when to take this   b  complex vitamins tablet Take 1 tablet by mouth daily.   escitalopram 10 MG tablet Commonly known as: LEXAPRO Take 10 mg by mouth daily.   Gas-X 80 MG chewable tablet Generic drug: simethicone Chew 80 mg by mouth once as needed for flatulence. Reported on 06/20/2016   Melatonin 10 MG Caps Take by mouth at bedtime.   omeprazole 20 MG capsule Commonly known as: PRILOSEC Take 20 mg by mouth daily.   rivaroxaban 20 MG Tabs tablet Commonly known as: XARELTO Take 1 tablet (20 mg total) by mouth daily with supper.   simvastatin 40 MG tablet Commonly known as: ZOCOR Take 1 tablet by mouth at bedtime.   solifenacin 10 MG tablet Commonly known as: VESICARE Take 1 tablet by mouth daily.       Past Surgical History:  He  has a past surgical history that includes Radioactive seed implant (02/28/2012); Cystoscopy (02/28/2012); Cataract extraction w/PHACO (Right, 11/06/2013); Colonoscopy (10/03/2006); Colonoscopy (N/A, 08/19/2014); and Cataract extraction w/PHACO (Left, 11/25/2015).  Family History:  His family history includes Breast cancer in his sister; COPD in his maternal aunt, sister, and son; Colon cancer in his sister; Heart attack in his brother; Hypertension in his mother and sister; Pneumonia in his sister; Stroke in his mother.  Social History:  He  reports that he quit smoking about 29 years ago. His smoking use included cigarettes. He has a 70.00 pack-year smoking history. His smokeless tobacco use includes chew. He reports current alcohol use of about 14.0 standard drinks of alcohol per week. He reports that he does not use drugs.

## 2020-06-10 ENCOUNTER — Telehealth: Payer: Self-pay | Admitting: Pulmonary Disease

## 2020-06-10 NOTE — Telephone Encounter (Signed)
Called and spoke with Jonathan Greene letting him know the info stated by Dr. Halford Chessman and he verbalized understanding. Jonathan Greene stated that he is not having any problems with the mask but states that he has noticed when he wakes up that there was a message about a leak. Jonathan Greene stated if it does begin to cause him a problem that he would call the office. Nothing further needed.

## 2020-06-10 NOTE — Telephone Encounter (Signed)
Pt returning missed call. [phone]

## 2020-06-10 NOTE — Telephone Encounter (Signed)
Called and left message for patient to return call.  

## 2020-06-10 NOTE — Telephone Encounter (Signed)
Auto CPAP 05/10/20 to 06/08/20 >> used on 29 of 30 nights with average 5 hrs 59 min.  Average AHI 1.1 with median CPAP 6 and 95 th percentile CPAP 8 cm H2O.  Air leak.   Please let him know that CPAP report shows great control of sleep apnea.  He does appear to be having some mask leak.  If this is causing him trouble using CPAP, then please send order to his DME to have his mask refit.

## 2020-06-16 ENCOUNTER — Encounter (HOSPITAL_COMMUNITY)
Admission: RE | Admit: 2020-06-16 | Discharge: 2020-06-16 | Disposition: A | Discharge status: Home / Self Care | Payer: Medicare Other | Source: Ambulatory Visit | Attending: Hematology | Admitting: Hematology

## 2020-06-16 ENCOUNTER — Other Ambulatory Visit: Payer: Self-pay

## 2020-06-16 ENCOUNTER — Telehealth: Payer: Self-pay | Admitting: Pulmonary Disease

## 2020-06-16 DIAGNOSIS — J439 Emphysema, unspecified: Secondary | ICD-10-CM | POA: Diagnosis not present

## 2020-06-16 DIAGNOSIS — G9389 Other specified disorders of brain: Secondary | ICD-10-CM | POA: Diagnosis not present

## 2020-06-16 DIAGNOSIS — I2510 Atherosclerotic heart disease of native coronary artery without angina pectoris: Secondary | ICD-10-CM | POA: Insufficient documentation

## 2020-06-16 DIAGNOSIS — K449 Diaphragmatic hernia without obstruction or gangrene: Secondary | ICD-10-CM | POA: Diagnosis not present

## 2020-06-16 DIAGNOSIS — R911 Solitary pulmonary nodule: Secondary | ICD-10-CM | POA: Insufficient documentation

## 2020-06-16 DIAGNOSIS — R918 Other nonspecific abnormal finding of lung field: Secondary | ICD-10-CM | POA: Insufficient documentation

## 2020-06-16 DIAGNOSIS — I700 Atherosclerosis of aorta: Secondary | ICD-10-CM | POA: Insufficient documentation

## 2020-06-16 DIAGNOSIS — I712 Thoracic aortic aneurysm, without rupture: Secondary | ICD-10-CM | POA: Diagnosis not present

## 2020-06-16 LAB — GLUCOSE, CAPILLARY: Glucose-Capillary: 116 mg/dL — ABNORMAL HIGH (ref 70–99)

## 2020-06-16 MED ORDER — FLUDEOXYGLUCOSE F - 18 (FDG) INJECTION
11.3000 | Freq: Once | INTRAVENOUS | Status: AC | PRN
  Administered 2020-06-16: 11.3 via INTRAVENOUS

## 2020-06-16 NOTE — Telephone Encounter (Signed)
Patient called to say he got his PET scan. Patient advised that we will contact him once Dr. Halford Chessman reviews his scan.

## 2020-06-17 ENCOUNTER — Telehealth (HOSPITAL_COMMUNITY): Payer: Self-pay | Admitting: *Deleted

## 2020-06-17 ENCOUNTER — Telehealth: Payer: Self-pay | Admitting: Pulmonary Disease

## 2020-06-17 ENCOUNTER — Encounter (HOSPITAL_COMMUNITY): Payer: Self-pay | Admitting: Radiology

## 2020-06-17 DIAGNOSIS — R942 Abnormal results of pulmonary function studies: Secondary | ICD-10-CM

## 2020-06-17 DIAGNOSIS — R911 Solitary pulmonary nodule: Secondary | ICD-10-CM

## 2020-06-17 NOTE — Progress Notes (Signed)
[facility] Male, 79 y.o., 1941-04-11 MRN:  [id number] Phone:  [phone] (H) ... PCP:  Celene Squibb, MD Coverage:  [place] With Radiology ([facility]-MR 1) 06/22/2020 at 4:00 PM  RE: CT Biopsy Received: Today Arne Cleveland, MD  Jillyn Hidden Ok   CT core LUL PET+ SPN   DDH       Previous Messages   ----- Message -----  From: Garth Bigness D  Sent: 06/17/2020  1:05 PM EDT  To: Ir Procedure Requests  Subject: CT Biopsy                     Procedure: CT Biopsy   Reason: Abnormal positron emission tomography (PET) of lung, PET positive Lt upper lung pleural based nodule, Nodule of left lung   History:  NM PET, CT in computer   Provider: Chesley Mires   Provider Contact: [phone]

## 2020-06-17 NOTE — Telephone Encounter (Signed)
PET scan 06/16/20 >> 2.4 x 2.3 cm pleural based LUL nodule 6.2 SUV, 6 mm AP window node 4.2 SUV, ascending aorta 4.1 cm, centrilobular and paraseptal emphysema   I spoke with pt and his wife over the phone and reviewed results.    Will arrange for referral to interventional radiology to do CT guided biopsy of pleural based Lt upper lobe nodule.

## 2020-06-17 NOTE — Telephone Encounter (Signed)
Patient called requesting PET scan results from 06/16/20.  Advised patient that the results from that scan and the MRI scheduled for 06/22/20 would be reviewed with him on his 06/28/20 follow up with Dr. Delton Coombes.  Patient's wife then got on the call and stated they could not wait until the 26tj for the PET results and requested Dr. Raliegh Ip call them with same.  Advised patient I would relay that message.

## 2020-06-17 NOTE — Telephone Encounter (Signed)
Pt had PET scan performed 7/14 and he is calling requesting to know the results. Dr. Halford Chessman, please advise.

## 2020-06-18 ENCOUNTER — Encounter (HOSPITAL_COMMUNITY): Payer: Self-pay | Admitting: Radiology

## 2020-06-18 NOTE — Progress Notes (Signed)
[facility] Male, 79 y.o., 12/28/1940 MRN:  [id number] Phone:  [phone] (H) ... PCP:  Celene Squibb, MD Coverage:  [place] With Radiology ([facility]-MR 1) 06/22/2020 at 4:00 PM  RE: Biopsy Received: Today Chesley Mires, MD  Garth Bigness D It is okay for him to hold xarelto one day before biopsy.   [place]       Previous Messages   ----- Message -----  From: Garth Bigness D  Sent: 06/17/2020  3:51 PM EDT  To: Chesley Mires, MD  Subject: Biopsy                      Dr. Halford Chessman, patient is on Xarelto and will need to hold 1 day prior to biopsy. Please advise if okay to hold. Thanks Galvin Proffer, MD Jillyn Hidden  Ok   CT core LUL PET+ SPN   DDH      Previous Messages     ----- Message -----  From: Garth Bigness D  Sent: 06/17/2020  1:05 PM EDT  To: Ir Procedure Requests  Subject: CT Biopsy                     Procedure: CT Biopsy   Reason: Abnormal positron emission tomography (PET) of lung, PET positive Lt upper lung pleural based nodule, Nodule of left lung   History:  NM PET, CT in computer   Provider: Chesley Mires

## 2020-06-22 ENCOUNTER — Other Ambulatory Visit: Payer: Self-pay

## 2020-06-22 ENCOUNTER — Ambulatory Visit (HOSPITAL_COMMUNITY)
Admission: RE | Admit: 2020-06-22 | Discharge: 2020-06-22 | Disposition: A | Discharge status: Home / Self Care | Payer: Medicare Other | Source: Ambulatory Visit | Attending: Hematology | Admitting: Hematology

## 2020-06-22 DIAGNOSIS — R918 Other nonspecific abnormal finding of lung field: Secondary | ICD-10-CM | POA: Insufficient documentation

## 2020-06-22 MED ORDER — GADOBUTROL 1 MMOL/ML IV SOLN
10.0000 mL | Freq: Once | INTRAVENOUS | Status: AC | PRN
  Administered 2020-06-22: 10 mL via INTRAVENOUS

## 2020-06-24 ENCOUNTER — Encounter: Payer: Self-pay | Admitting: Physician Assistant

## 2020-06-24 ENCOUNTER — Other Ambulatory Visit: Payer: Self-pay

## 2020-06-24 ENCOUNTER — Ambulatory Visit (INDEPENDENT_AMBULATORY_CARE_PROVIDER_SITE_OTHER): Payer: Medicare Other | Admitting: Physician Assistant

## 2020-06-24 ENCOUNTER — Ambulatory Visit (INDEPENDENT_AMBULATORY_CARE_PROVIDER_SITE_OTHER): Payer: Medicare Other

## 2020-06-24 VITALS — BP 106/60 | HR 86 | Ht 74.0 in | Wt 223.0 lb

## 2020-06-24 DIAGNOSIS — I4811 Longstanding persistent atrial fibrillation: Secondary | ICD-10-CM

## 2020-06-24 DIAGNOSIS — I4819 Other persistent atrial fibrillation: Secondary | ICD-10-CM

## 2020-06-24 DIAGNOSIS — R942 Abnormal results of pulmonary function studies: Secondary | ICD-10-CM | POA: Diagnosis not present

## 2020-06-24 DIAGNOSIS — I10 Essential (primary) hypertension: Secondary | ICD-10-CM | POA: Diagnosis not present

## 2020-06-24 NOTE — Progress Notes (Signed)
Cardiology Office Note   Date:  06/24/2020   ID:  Jonathan Greene, DOB 1941-05-18, MRN [id number]  PCP:  Celene Squibb, MD Cardiologist:  Carlyle Dolly, MD 10/20/2019 Electrphysiologist: None Rosaria Ferries, PA-C   No chief complaint on file.   History of Present Illness: Jonathan Greene is a 79 y.o. male with a history of Afib, COPD, CVA, GERD, HTN, HLD, prostate CA, sinus brady, OSA w/ intermittent compliance CPAP, 4.3 cm Aortic root on CT 06/03/2020  07/16 Notes regarding abnl  CT (4.4 x 2.3 x 2.2 cm mass) & PET scan, needs CT bx lung LUL scheduled 07/29, Pulm MD Dr Halford Chessman  Jonathan Greene presents for cardiology follow up.  He is using his CPAP consistently.  He has low energy, he feels he is weak at baseline and gives out more quickly than usual. +increased DOE but no orthopnea, PND or LE edema.  He has presyncope at time, no syncope or falls.  He has no specific awareness of the atrial fibrillation, just "side effects" from it  He feels that these sx are worse than they were last year. He is still doing things around the house such as gardening, working on the car. He is much slower than previously, this is aggravating for him.   He is compliant w/ Xarelto, no bleeding issues.   Questions from the family: 1) Does the Afib affect BP? Not if HR is controlled  2) Can he stay in Afib all the time? Some people do, depends on multiple things  3) is his BP too low? That may contribute to him feeling bad, will decrease rx.  4) Can he get an echo? None since 2016, good idea, it will help guide Afib treatment  5) Watchman? Not a candidate unless compelling reason to stop Xarelto.   Past Medical History:  Diagnosis Date  . A-fib ([place])   . Bradycardia   . COPD (chronic obstructive pulmonary disease) ([place])   . Frequent urination   . GERD (gastroesophageal reflux disease)   . Hypercholesterolemia   . Hypertension   . Prostate cancer ([place])    RADIATION [place] IMPLANTS 02-28-12  . Sleep apnea   . Stroke ([place])    2011;ST memory loss, no other deficits.  . Urinary urgency     Past Surgical History:  Procedure Laterality Date  . CATARACT EXTRACTION W/PHACO Right 11/06/2013   Procedure: CATARACT EXTRACTION PHACO AND INTRAOCULAR LENS PLACEMENT ([place]);  Surgeon: Tonny Branch, MD;  Location: [facility];  Service: Ophthalmology;  Laterality: Right;  CDE 11.67  . CATARACT EXTRACTION W/PHACO Left 11/25/2015   Procedure: CATARACT EXTRACTION PHACO AND INTRAOCULAR LENS PLACEMENT ; CDE:  8.15;  Surgeon: Tonny Branch, MD;  Location: [facility];  Service: Ophthalmology;  Laterality: Left;  . COLONOSCOPY  10/03/2006   LKG:[id number] rectal polyps cold biopsied/removed, otherwise normal rectum/Sigmoid diverticula.  Remainder of colon mucosa appeared normal  . COLONOSCOPY N/A 08/19/2014   RMR: Radiation proctitis-status post APC ablation. Colonic diverticulosis.  . CYSTOSCOPY  02/28/2012   Procedure: CYSTOSCOPY;  Surgeon: Bernestine Amass, MD;  [facility];  Service: Urology;  Laterality: N/A;   no seeds noted in bladder  . RADIOACTIVE SEED IMPLANT  02/28/2012   Procedure: RADIOACTIVE SEED IMPLANT;  Surgeon: Bernestine Amass, MD;  [facility];  Service: Urology;  Laterality: N/A;  67seeds implanted     Current Outpatient Medications  Medication Sig Dispense Refill  .  albuterol (VENTOLIN HFA) 108 (90 Base) MCG/ACT inhaler Inhale 2 puffs into the lungs every 6 (six) hours as needed for wheezing or shortness of breath. 6.7 g 5  . amLODipine (NORVASC) 10 MG tablet Take 5 mg by mouth daily.    Marland Kitchen b complex vitamins tablet Take 1 tablet by mouth daily.    Marland Kitchen escitalopram (LEXAPRO) 10 MG tablet Take 10 mg by mouth daily.    . Melatonin 10 MG CAPS Take 10 mg by mouth at bedtime.     Marland Kitchen omeprazole (PRILOSEC) 20 MG capsule Take 20 mg by mouth daily.     . rivaroxaban (XARELTO) 20 MG TABS tablet Take 1 tablet (20 mg total) by  mouth daily with supper. 90 tablet 4  . simethicone (GAS-X) 80 MG chewable tablet Chew 80 mg by mouth once as needed for flatulence. Reported on 06/20/2016    . simvastatin (ZOCOR) 40 MG tablet Take 40 mg by mouth at bedtime.     . solifenacin (VESICARE) 10 MG tablet Take 10 mg by mouth daily.     . tamsulosin (FLOMAX) 0.4 MG CAPS capsule Take 0.4 mg by mouth daily.     No current facility-administered medications for this visit.    Allergies:   Patient has no known allergies.    Social History:  The patient  reports that he quit smoking about 29 years ago. His smoking use included cigarettes. He has a 70.00 pack-year smoking history. His smokeless tobacco use includes chew. He reports current alcohol use of about 14.0 standard drinks of alcohol per week. He reports that he does not use drugs.   Family History:  The patient's family history includes Breast cancer in his sister; COPD in his maternal aunt, sister, and son; Colon cancer in his sister; Heart attack in his brother; Hypertension in his mother and sister; Pneumonia in his sister; Stroke in his mother.  He indicated that his mother is deceased. He indicated that his father is deceased. He indicated that three of his four sisters are alive. He indicated that both of his brothers are deceased. He indicated that his daughter is alive. He indicated that his son is alive. He indicated that his maternal aunt is alive. He indicated that his paternal aunt is deceased. He indicated that his paternal uncle is deceased. He indicated that the status of his neg hx is unknown.  ROS:  Please see the history of present illness. All other systems are reviewed and negative.    PHYSICAL EXAM: VS:  BP (!) 106/60   Pulse 86   Ht 6\' 2"  (1.88 m)   Wt (!) 223 lb (101.2 kg)   SpO2 97%   BMI 28.63 kg/m  , BMI Body mass index is 28.63 kg/m. GEN: Well nourished, well developed, male in no acute distress HEENT: normal for age  Neck: no JVD, no carotid  bruit, no masses Cardiac: Irregular rate and rhythm; no murmur, no rubs, or gallops Respiratory: Decreased breath sounds bases bilaterally, normal work of breathing GI: soft, nontender, nondistended, + BS MS: no deformity or atrophy; no edema; distal pulses are 2+ in all 4 extremities  Skin: warm and dry, no rash Neuro:  Strength and sensation are intact Psych: euthymic mood, full affect   EKG:  EKG is not ordered today.  ECHO: 02/10/2015 - Left ventricle: The cavity size was normal. Wall thickness was  increased in a pattern of mild LVH. Systolic function was normal.  The estimated ejection fraction was in the  range of 50% to 55%.  Wall motion was normal; there were no regional wall motion  abnormalities. Doppler parameters are consistent with abnormal  left ventricular relaxation (grade 1 diastolic dysfunction).  - Aortic valve: Mildly calcified annulus. Trileaflet; mildly  thickened leaflets. There was mild regurgitation. Valve area  (VTI): 3.12 cm^2. Valve area (Vmax): 3.15 cm^2.  - Aortic root: The visualized portion of the proximal ascending  aorta is mildly dilated at 4.0 cm.  - Mitral valve: Mildly calcified annulus. Mildly thickened leaflets  - Systemic veins: IVC appears small, suggestive of low RA pressures  and hypovolemia.  - Technically adequate study.     ETT: 06/21/2017  Blood pressure demonstrated a normal response to exercise.  ECG had significant artifact throughout study.  Submaximal stress test as inadequate heart rate achieved.  Consider alternative forms of stress testing with imaging if deemed clinically indicated.    Recent Labs: 12/31/2019: Hemoglobin 15.4; Platelets 184  CBC    Component Value Date/Time   WBC 6.8 12/31/2019 0952   RBC 4.94 12/31/2019 0952   HGB 15.4 12/31/2019 0952   HCT 45.6 12/31/2019 0952   PLT 184 12/31/2019 0952   MCV 92.3 12/31/2019 0952   MCH 31.2 12/31/2019 0952   MCHC 33.8 12/31/2019 0952   RDW  12.8 12/31/2019 0952   LYMPHSABS 1,605 12/31/2019 0952   MONOABS 1.0 02/29/2016 2145   EOSABS 150 12/31/2019 0952   BASOSABS 41 12/31/2019 0952   CMP Latest Ref Rng & Units 01/20/2017 02/29/2016 06/09/2015  Glucose 65 - 99 mg/dL 154(H) 111(H) -  BUN 6 - 20 mg/dL 18 32(H) -  Creatinine 0.61 - 1.24 mg/dL 1.25(H) 1.63(H) 1.30(H)  Sodium 135 - 145 mmol/L 139 135 -  Potassium 3.5 - 5.1 mmol/L 4.4 3.5 -  Chloride 101 - 111 mmol/L 103 104 -  CO2 22 - 32 mmol/L 28 21(L) -  Calcium 8.9 - 10.3 mg/dL 9.6 8.5(L) -  Total Protein 6.5 - 8.1 g/dL 7.5 8.1 -  Total Bilirubin 0.3 - 1.2 mg/dL 0.9 0.9 -  Alkaline Phos 38 - 126 U/L 69 73 -  AST 15 - 41 U/L 55(H) 63(H) -  ALT 17 - 63 U/L 42 64(H) -     Lipid Panel No results found for: CHOL, HDL, LDLCALC, LDLDIRECT, TRIG, CHOLHDL    Wt Readings from Last 3 Encounters:  06/24/20 (!) 223 lb (101.2 kg)  06/09/20 224 lb (101.6 kg)  06/08/20 224 lb (101.6 kg)     Other studies Reviewed: Additional studies/ records that were reviewed today include: Office notes, hospital records and testing.  ASSESSMENT AND PLAN:  1.  Persistent atrial fibrillation: -His heart rate is higher than usual today -I explained to he and his daughter that I could not put him on rate lowering medications because he has a history of significant sinus bradycardia -We will get a monitor to determine if he is in atrial fibrillation all the time, and if his heart rate is controlled -Get an echo as well to determine if his EF is normal or if he perhaps is developing a tachycardia mediated cardiomyopathy.  It will also help to know if his atria are normal in size or are dilated. -I explained that he might need an antiarrhythmic, but I was not going to start that now until we got the information from the echo and the monitor. -He may also need referral to EP.  2.  Hypertension: -I want to make sure that his weakness is not  coming from hypotension as he describes frequent episodes of  presyncope and getting lightheaded and weak with exertion. -He had already cut back on the amlodipine from 10 mg to 5 mg daily.  I will stop it completely and see if his symptoms improve.  3.  Chronic anticoagulation: -CHA2DS2-VASc is 72 (age x2, HTN, CVA x2) -Continue Xarelto  4.  Abnormal PET scan: -The family wished to understand the PET scan report better, I explained that the hypermetabolic areas are the source of concern. -He has a biopsy scheduled, the MDs will know more after that -Depending on what they find and the treatment for it, that may affect the treatment of his atrial fibrillation.   Current medicines are reviewed at length with the patient today.  The patient has concerns regarding medicines.  Concerns were addressed  The following changes have been made: DC amlodipine  Labs/ tests ordered today include:   Orders Placed This Encounter  Procedures  . CARDIAC EVENT MONITOR  . ECHOCARDIOGRAM COMPLETE     Disposition:   FU with Carlyle Dolly, MD  Signed, Rosaria Ferries, PA-C  06/24/2020 3:02 PM    [place] Phone: [phone]; Fax: [phone]

## 2020-06-24 NOTE — Patient Instructions (Signed)
Medication Instructions:  Your physician has recommended you make the following change in your medication:   Stop Taking Amlodipine  Goal blood pressure is 130/80   *If you need a refill on your cardiac medications before your next appointment, please call your pharmacy*   Lab Work: NONE   If you have labs (blood work) drawn today and your tests are completely normal, you will receive your results only by: Marland Kitchen MyChart Message (if you have MyChart) OR . A paper copy in the mail If you have any lab test that is abnormal or we need to change your treatment, we will call you to review the results.   Testing/Procedures: Your physician has requested that you have an echocardiogram. Echocardiography is a painless test that uses sound waves to create images of your heart. It provides your doctor with information about the size and shape of your heart and how well your heart's chambers and valves are working. This procedure takes approximately one hour. There are no restrictions for this procedure.     Follow-Up: At [facility], you and your health needs are our priority.  As part of our continuing mission to provide you with exceptional heart care, we have created designated Provider Care Teams.  These Care Teams include your primary Cardiologist (physician) and Advanced Practice Providers (APPs -  Physician Assistants and Nurse Practitioners) who all work together to provide you with the care you need, when you need it.  We recommend signing up for the patient portal called "MyChart".  Sign up information is provided on this After Visit Summary.  MyChart is used to connect with patients for Virtual Visits (Telemedicine).  Patients are able to view lab/test results, encounter notes, upcoming appointments, etc.  Non-urgent messages can be sent to your provider as well.   To learn more about what you can do with MyChart, go to [email or web address].    Your next appointment:   6  week(s)  The format for your next appointment:   In Person  Provider:   Carlyle Dolly, MD   Other Instructions Thank you for choosing [facility]! '

## 2020-06-28 ENCOUNTER — Other Ambulatory Visit (HOSPITAL_COMMUNITY)
Admission: RE | Admit: 2020-06-28 | Discharge: 2020-06-28 | Disposition: A | Discharge status: Home / Self Care | Payer: Medicare Other | Source: Ambulatory Visit | Attending: Pulmonary Disease | Admitting: Pulmonary Disease

## 2020-06-28 ENCOUNTER — Inpatient Hospital Stay (HOSPITAL_COMMUNITY): Payer: Medicare Other | Admitting: Hematology

## 2020-06-28 ENCOUNTER — Other Ambulatory Visit: Payer: Self-pay

## 2020-06-28 VITALS — BP 125/86 | HR 84 | Temp 98.8°F | Resp 18 | Wt 225.2 lb

## 2020-06-28 DIAGNOSIS — Z20822 Contact with and (suspected) exposure to covid-19: Secondary | ICD-10-CM | POA: Diagnosis not present

## 2020-06-28 DIAGNOSIS — R918 Other nonspecific abnormal finding of lung field: Secondary | ICD-10-CM

## 2020-06-28 DIAGNOSIS — Z01812 Encounter for preprocedural laboratory examination: Secondary | ICD-10-CM | POA: Insufficient documentation

## 2020-06-28 NOTE — Progress Notes (Signed)
[facility] [address],  [address]   CLINIC:  Medical Oncology/Hematology  PCP:  Jonathan Squibb, MD [address] Jonathan Greene [place] [address] [phone]   REASON FOR VISIT:  Follow-up for left upper lobe lung mass  PRIOR THERAPY: None  NGS Results: N/A  CURRENT THERAPY: To be discussed.  BRIEF ONCOLOGIC HISTORY:  Oncology History   No history exists.    CANCER STAGING: Cancer Staging Prostate cancer [facility]) Staging form: Prostate, AJCC 7th Edition - Clinical: Stage IIA (T2a, N0, M0) - Signed by Jonathan Edison, MD on 12/07/2011 - Pathologic: No stage assigned - Unsigned   INTERVAL HISTORY:  Jonathan Greene, a 79 y.o. male, returns for routine follow-up of his left upper lobe lung mass. Jonathan Greene was last seen on 06/08/2020. He is accompanied by his daughter.  Since his last visit, he has been followed by Jonathan Greene in Pulmonology.   He also completed imaging studies.   He is scheduled for a biopsy on 07/01/2020.   REVIEW OF SYSTEMS:  Review of Systems  Constitutional: Negative.   HENT:  Negative.   Eyes: Negative.   Respiratory: Positive for cough.   Cardiovascular: Negative.   Gastrointestinal: Positive for diarrhea.  Endocrine: Negative.   Genitourinary: Negative.    Musculoskeletal: Negative.   Skin: Negative.   Neurological: Positive for dizziness.  Hematological: Negative.   Psychiatric/Behavioral: Negative.   All other systems reviewed and are negative.   PAST MEDICAL/SURGICAL HISTORY:  Past Medical History:  Diagnosis Date  . A-fib ([place])   . Bradycardia   . COPD (chronic obstructive pulmonary disease) ([place])   . Frequent urination   . GERD (gastroesophageal reflux disease)   . Hypercholesterolemia   . Hypertension   . Prostate cancer ([place])    RADIATION [place] IMPLANTS 02-28-12  . Sleep apnea   . Stroke ([place])    2011;ST memory loss, no other deficits.  . Urinary urgency    Past  Surgical History:  Procedure Laterality Date  . CATARACT EXTRACTION W/PHACO Right 11/06/2013   Procedure: CATARACT EXTRACTION PHACO AND INTRAOCULAR LENS PLACEMENT ([place]);  Surgeon: Jonathan Branch, MD;  Location: [facility];  Service: Ophthalmology;  Laterality: Right;  CDE 11.67  . CATARACT EXTRACTION W/PHACO Left 11/25/2015   Procedure: CATARACT EXTRACTION PHACO AND INTRAOCULAR LENS PLACEMENT ; CDE:  8.15;  Surgeon: Jonathan Branch, MD;  Location: [facility];  Service: Ophthalmology;  Laterality: Left;  . COLONOSCOPY  10/03/2006   YQI:[id number] rectal polyps cold biopsied/removed, otherwise normal rectum/Sigmoid diverticula.  Remainder of colon mucosa appeared normal  . COLONOSCOPY N/A 08/19/2014   RMR: Radiation proctitis-status post APC ablation. Colonic diverticulosis.  . CYSTOSCOPY  02/28/2012   Procedure: CYSTOSCOPY;  Surgeon: Jonathan Amass, MD;  Location[facility];  Service: Urology;  Laterality: N/A;   no seeds noted in bladder  . RADIOACTIVE SEED IMPLANT  02/28/2012   Procedure: RADIOACTIVE SEED IMPLANT;  Surgeon: Jonathan Amass, MD;  Location: [facility];  Service: Urology;  Laterality: N/A;  67seeds implanted     SOCIAL HISTORY:  Social History   Socioeconomic History  . Marital status: Married    Spouse name: Jonathan Greene  . Number of children: 2  . Years of education: Not on file  . Highest education level: Not on file  Occupational History  . Occupation: retired  Tobacco Use  . Smoking status: Former Smoker    Packs/day: 2.00  Years: 35.00    Pack years: 70.00    Types: Cigarettes    Quit date: 02/21/1991    Years since quitting: 29.3  . Smokeless tobacco: Current User    Types: Chew    Last attempt to quit: 03/11/2016  . Tobacco comment: CHEW TOBACCO FOR 20 YRS   Substance and Sexual Activity  . Alcohol use: Yes    Alcohol/week: 14.0 standard drinks    Types: 14 Shots of liquor per week    Comment: 4 ounces daily  . Drug use: No  . Sexual activity:  Yes    Birth control/protection: None  Other Topics Concern  . Not on file  Social History Narrative  . Not on file   Social Determinants of Health   Financial Resource Strain: Low Risk   . Difficulty of Paying Living Expenses: Not hard at all  Food Insecurity: No Food Insecurity  . Worried About Charity fundraiser in the Last Year: Never true  . Ran Out of Food in the Last Year: Never true  Transportation Needs: No Transportation Needs  . Lack of Transportation (Medical): No  . Lack of Transportation (Non-Medical): No  Physical Activity:   . Days of Exercise per Week:   . Minutes of Exercise per Session:   Stress: No Stress Concern Present  . Feeling of Stress : Not at all  Social Connections: Moderately Isolated  . Frequency of Communication with Friends and Family: More than three times a week  . Frequency of Social Gatherings with Friends and Family: More than three times a week  . Attends Religious Services: Never  . Active Member of Clubs or Organizations: No  . Attends Archivist Meetings: Never  . Marital Status: Married  Human resources officer Violence: Not At Risk  . Fear of Current or Ex-Partner: No  . Emotionally Abused: No  . Physically Abused: No  . Sexually Abused: No   He notes that he was exposed to asbestos when he worked for [employer] Greene.    FAMILY HISTORY:  Family History  Problem Relation Age of Onset  . Stroke Mother   . Hypertension Mother   . Colon cancer Sister        in her 66s  . Breast cancer Sister   . Heart attack Brother   . COPD Maternal Aunt   . COPD Sister   . Hypertension Sister   . Pneumonia Sister   . COPD Son   . Cancer Neg Hx     CURRENT MEDICATIONS:  Current Outpatient Medications  Medication Sig Dispense Refill  . b complex vitamins tablet Take 1 tablet by mouth daily.    Marland Kitchen escitalopram (LEXAPRO) 10 MG tablet Take 10 mg by mouth daily.    . Melatonin 10 MG CAPS Take 10 mg by mouth at bedtime.     Marland Kitchen omeprazole  (PRILOSEC) 20 MG capsule Take 20 mg by mouth daily.     . rivaroxaban (XARELTO) 20 MG TABS tablet Take 1 tablet (20 mg total) by mouth daily with supper. 90 tablet 4  . simvastatin (ZOCOR) 40 MG tablet Take 40 mg by mouth at bedtime.     . solifenacin (VESICARE) 10 MG tablet Take 10 mg by mouth daily.     . tamsulosin (FLOMAX) 0.4 MG CAPS capsule Take 0.4 mg by mouth daily.    Marland Kitchen triamcinolone cream (KENALOG) 0.1 % Apply topically.    Marland Kitchen albuterol (VENTOLIN HFA) 108 (90 Base) MCG/ACT inhaler Inhale 2 puffs  into the lungs every 6 (six) hours as needed for wheezing or shortness of breath. (Patient not taking: Reported on 06/28/2020) 6.7 g 5  . simethicone (GAS-X) 80 MG chewable tablet Chew 80 mg by mouth once as needed for flatulence. Reported on 06/20/2016 (Patient not taking: Reported on 06/28/2020)     No current facility-administered medications for this visit.    ALLERGIES:  No Known Allergies  PHYSICAL EXAM:  Performance status (ECOG): 1 - Symptomatic but completely ambulatory  Vitals:   06/28/20 1159  BP: (!) 125/86  Pulse: 84  Resp: 18  Temp: 98.8 F (37.1 C)  SpO2: 98%   Wt Readings from Last 3 Encounters:  06/28/20 (!) 225 lb 3.2 oz (102.2 kg)  06/24/20 (!) 223 lb (101.2 kg)  06/09/20 224 lb (101.6 kg)   Physical Exam Vitals reviewed.  Constitutional:      Appearance: Normal appearance.  HENT:     Mouth/Throat:     Mouth: Mucous membranes are moist.  Eyes:     Pupils: Pupils are equal, round, and reactive to light.  Cardiovascular:     Rate and Rhythm: Normal rate. Rhythm regularly irregular.     Pulses: Normal pulses.     Heart sounds: Normal heart sounds.  Pulmonary:     Effort: Pulmonary effort is normal.     Breath sounds: Normal breath sounds.  Abdominal:     Palpations: Abdomen is soft. There is no mass.     Tenderness: There is no abdominal tenderness.  Musculoskeletal:        General: No tenderness.  Neurological:     General: No focal deficit present.      Mental Status: He is alert and oriented to person, place, and time.  Psychiatric:        Mood and Affect: Mood normal.        Behavior: Behavior normal.      LABORATORY DATA:  I have reviewed the labs as listed.  CBC Latest Ref Rng & Units 12/31/2019 01/20/2017 02/29/2016  WBC 3.8 - 10.8 Thousand/uL 6.8 11.6(H) 9.5  Hemoglobin 13.2 - 17.1 g/dL 15.4 15.2 17.1(H)  Hematocrit 38 - 50 % 45.6 45.7 49.5  Platelets 140 - 400 Thousand/uL 184 165 170   CMP Latest Ref Rng & Units 01/20/2017 02/29/2016 06/09/2015  Glucose 65 - 99 mg/dL 154(H) 111(H) -  BUN 6 - 20 mg/dL 18 32(H) -  Creatinine 0.61 - 1.24 mg/dL 1.25(H) 1.63(H) 1.30(H)  Sodium 135 - 145 mmol/L 139 135 -  Potassium 3.5 - 5.1 mmol/L 4.4 3.5 -  Chloride 101 - 111 mmol/L 103 104 -  CO2 22 - 32 mmol/L 28 21(L) -  Calcium 8.9 - 10.3 mg/dL 9.6 8.5(L) -  Total Protein 6.5 - 8.1 g/dL 7.5 8.1 -  Total Bilirubin 0.3 - 1.2 mg/dL 0.9 0.9 -  Alkaline Phos 38 - 126 U/L 69 73 -  AST 15 - 41 U/L 55(H) 63(H) -  ALT 17 - 63 U/L 42 64(H) -    DIAGNOSTIC IMAGING:  I have independently reviewed the scans and discussed with the patient. CT CHEST WO CONTRAST  Result Date: 06/03/2020 CLINICAL DATA:  79 year old male with shortness of breath. EXAM: CT CHEST WITHOUT CONTRAST TECHNIQUE: Multidetector CT imaging of the chest was performed following the standard protocol without IV contrast. COMPARISON:  Chest radiograph dated 05/12/2020 and chest CT dated 06/09/2015. FINDINGS: Evaluation of this exam is limited in the absence of intravenous contrast. Cardiovascular: There is no cardiomegaly or  pericardial effusion. Three-vessel coronary vascular calcification. There is moderate atherosclerotic calcification of the thoracic aorta. The ascending aorta measures 4.3 cm in diameter. The central pulmonary arteries are grossly unremarkable. Mediastinum/Nodes: There is no hilar or mediastinal adenopathy. The esophagus is grossly unremarkable. No mediastinal fluid  collection. Lungs/Pleura: Severe emphysema. There is a 2.3 x 2.2 x 4.4 cm subpleural masslike consolidation in the left upper lobe corresponding to the density seen on the prior radiograph. This is not characterized on this CT and may represent pneumonia or a neoplastic process. Clinical correlation and close follow-up recommended. Pulmonary consult/referral is advised. There is no pleural effusion or pneumothorax. The central airways are patent. Upper Abdomen: No acute abnormality. Musculoskeletal: Degenerative changes of the spine. No acute osseous pathology. IMPRESSION: 1. Left upper lobe subpleural consolidation versus mass. Clinical correlation and close follow-up recommended. 2. 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 Recommend annual imaging followup by CTA or MRA. This recommendation follows 2010 ACCF/AHA/AATS/ACR/ASA/SCA/SCAI/SIR/STS/SVM Guidelines for the Diagnosis and Management of Patients with Thoracic Aortic Disease. Circulation. 2010; 121: X324-M010. Aortic aneurysm NOS (ICD10-I71.9) 3. 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 Electronically Signed   By: Anner Crete M.D.   On: 06/03/2020 19:25   MR Brain W Wo Contrast  Result Date: 06/23/2020 CLINICAL DATA:  New diagnosis lung mass.  Staging. EXAM: MRI HEAD WITHOUT AND WITH CONTRAST TECHNIQUE: Multiplanar, multiecho pulse sequences of the brain and surrounding structures were obtained without and with intravenous contrast. CONTRAST:  74mL GADAVIST GADOBUTROL 1 MMOL/ML IV SOLN COMPARISON:  CT 12/11/2013 FINDINGS: Brain: Diffusion imaging does not show any acute or subacute infarction or other cause of restricted diffusion. Chronic small-vessel changes affect the a dull a and pons. No focal cerebellar insult. Cerebral hemispheres show old infarction in the right posterior temporal lobe and temporoparietal junction with atrophy, encephalomalacia and gliosis. Patient does not show a pattern of widespread  small-vessel disease. 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 This was seen in 2015 but appeared larger at that time. This could be re-evaluated with CT angiography or catheter angiography. It does not represent a neoplastic mass. After contrast administration, there is a single suspicious focus of enhancement along the inferolateral margin of the temporal lobe on the left measuring 4 mm, axial image 75. This is not well seen on the sagittal or coronal postcontrast imaging and I am not certain if this represents a small metastasis or a surface vessel. No second suspicious focus is identified. Vascular: Major vessels at the base of the brain show flow. 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 This was present on a CT study of 2015 but measured larger at that time, and may have undergone additional thrombosis. 4 mm focus of enhancement at the inferolateral margin of the left temporal lobe. This is possibly a small superficial metastasis. I am not certain of this however, and it could possibly represent a surface vessel. This could either be followed or reimaged in higher detail. Electronically Signed   By: Nelson Chimes M.D.   On: 06/23/2020 22:00   NM PET Image Initial (PI) Skull Base To Thigh  Result Date: 06/16/2020 CLINICAL DATA:  Initial treatment strategy for left upper lobe lung lesion. COVID-19 vaccine in left arm 2 months ago. EXAM:  NUCLEAR MEDICINE PET SKULL BASE TO THIGH TECHNIQUE: 11.3 mCi F-18 FDG was injected intravenously. Full-ring PET imaging was performed from the skull base to thigh after the radiotracer. CT data was obtained and used for attenuation correction and anatomic localization. Fasting blood  glucose: 116 mg/dl COMPARISON:  Chest CT 06/03/2020 FINDINGS: Mediastinal blood pool activity: SUV max 2.8 Liver activity: SUV max NA NECK: Photopenia within the right parietal lobe corresponding to encephalomalacia on 04/04. No cervical nodal hypermetabolism. Incidental CT findings: A calcified cortical based right frontal/parietal lesion on 02/02 measures 1.0 cm. Advanced cerebral atrophy. No cervical adenopathy.  Bilateral carotid atherosclerosis. CHEST: Posterior left upper lobe pleural-based hypermetabolic nodule. 2.4 x 2.3 cm and a S.U.V. max of 6.2 on 29/8. This is similar in size to on the prior exam. Right infrahilar hypermetabolism is favored to be vascular/physiologic. No correlate adenopathy. An AP window node measures 6 mm and a S.U.V. max of 4.3 on 74/4. Incidental CT findings: Mild ascending aortic dilatation including at 4.1 cm on 81/4. Tortuous thoracic aorta. Moderate cardiomegaly, without pericardial effusion. Tiny hiatal hernia.  Fluid or debris within the esophagus on 67/4. Centrilobular and paraseptal emphysema.  Bibasilar atelectasis. ABDOMEN/PELVIS: No abdominopelvic parenchymal or nodal hypermetabolism. Incidental CT findings: Normal adrenal glands. Mild renal cortical thinning bilaterally. Too small to characterize interpolar right renal lesion. Mild hepatic steatosis. Scattered colonic diverticula. Radiation seeds in the prostate. Aortic atherosclerosis. 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 Moderate to large, minimally complex right-sided hydrocele. SKELETON: No abnormal marrow activity. Incidental CT findings: none IMPRESSION: 1. Similar size of a left upper lobe pulmonary nodule, which is hypermetabolic and consistent with primary bronchogenic carcinoma. 2. AP window diminutive but hypermetabolic node, suspicious for ipsilateral mediastinal nodal metastasis. 3. No hypermetabolic extrathoracic metastatic disease. 4. Calcified  right cerebral cortical base lesion is suboptimally evaluated. 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 5. Right parietal encephalomalacia, possibly related to remote infarct. 6. Incidental findings, including: Aortic atherosclerosis (ICD10-I70.0), coronary artery atherosclerosis and emphysema (ICD10-J43.9). 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 Tiny hiatal hernia. Esophageal air fluid level suggests dysmotility or gastroesophageal reflux. 7. Mild ascending thoracic aortic aneurysm, as before. Electronically Signed   By: Abigail Miyamoto M.D.   On: 06/16/2020 15:16     ASSESSMENT:  1.  Left upper lobe lung mass: -Recent history of pneumonia followed by abnormal chest x-ray findings. -CT of the chest without contrast on 06/03/2020 showed 2.3 x 2.2 x 4.4 cm subpleural mass like consolidation in the left upper lobe corresponding to the density seen on the prior radiograph. -Patient smoked for 33 years, quit smoking at age 31.  He smoked on average of 1 pack/day for most years and at the time of quitting, was smoking 3 packs/day of cigarettes. 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  He reports asbestos exposure throughout his career. 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 -PET scan on 06/16/2020 shows posterior left upper lobe pleural-based hypermetabolic nodule, 2.4 x 2.3 cm SUV 6.2.  AP window lymph node measures 6 mm with SUV 4.3.  No hypermetabolic extrathoracic metastatic disease. -MRI of the brain on 06/22/2020 shows single suspicious focus of enhancement along the inferolateral margin of the temporal lobe on the left measuring 4 mm, not well seen on sagittal or coronal postcontrast imaging.  Unsure if it is small meta stasis or a  surface vessel.  No second suspicious focus identified.  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  2.  Ascending  aortic aneurysm: -4.3 cm aneurysmal dilatation of the ascending aorta was noted on the current CT scan. -Annual imaging by CTA or MRA was recommended.   PLAN:  1.  Left upper lobe lung mass: -We reviewed images of the PET scan and MRI of the brain and discussed the findings in detail. -He will have a biopsy of the lung lesion on 07/01/2020. -I plan to present his case at tumor board.  Increased likelihood of mesothelioma given his asbestos exposure. -I plan to see him back next week to discuss pathology report and treatment plan.  2.  COPD: -Continue albuterol as needed. -Pulmonary function tests planned.  3.  Sleep apnea: -Continue CPAP.  4.  Atrial fibrillation: -Continue Xarelto.  Rate well controlled. -He will hold Xarelto 2 days prior to his biopsy.   Orders placed this encounter:  No orders of the defined types were placed in this encounter.  Total time spent is 40 minutes with more than 50% of the time spent face-to-face discussing and reviewing scan results, further work-up, possible treatment options, counseling and coordination of care.  Derek Jack, MD [facility] [phone]   I, Jacqualyn Posey, am acting as a scribe for Dr. Sanda Linger.  I, Derek Jack MD, have reviewed the above documentation for accuracy and completeness, and I agree with the above.

## 2020-06-29 LAB — SARS CORONAVIRUS 2 (TAT 6-24 HRS): SARS Coronavirus 2: NEGATIVE

## 2020-06-30 ENCOUNTER — Other Ambulatory Visit: Payer: Self-pay

## 2020-06-30 ENCOUNTER — Telehealth: Payer: Self-pay | Admitting: Physician Assistant

## 2020-06-30 ENCOUNTER — Ambulatory Visit (HOSPITAL_COMMUNITY)
Admission: RE | Admit: 2020-06-30 | Discharge: 2020-06-30 | Disposition: A | Discharge status: Home / Self Care | Payer: Medicare Other | Source: Ambulatory Visit | Attending: Pulmonary Disease | Admitting: Pulmonary Disease

## 2020-06-30 ENCOUNTER — Ambulatory Visit (HOSPITAL_BASED_OUTPATIENT_CLINIC_OR_DEPARTMENT_OTHER)
Admission: RE | Admit: 2020-06-30 | Discharge: 2020-06-30 | Disposition: A | Discharge status: Home / Self Care | Payer: Medicare Other | Source: Ambulatory Visit | Attending: Internal Medicine | Admitting: Internal Medicine

## 2020-06-30 ENCOUNTER — Other Ambulatory Visit: Payer: Self-pay | Admitting: Radiology

## 2020-06-30 DIAGNOSIS — I4819 Other persistent atrial fibrillation: Secondary | ICD-10-CM

## 2020-06-30 DIAGNOSIS — J432 Centrilobular emphysema: Secondary | ICD-10-CM | POA: Insufficient documentation

## 2020-06-30 DIAGNOSIS — Z87891 Personal history of nicotine dependence: Secondary | ICD-10-CM | POA: Insufficient documentation

## 2020-06-30 DIAGNOSIS — R001 Bradycardia, unspecified: Secondary | ICD-10-CM | POA: Diagnosis not present

## 2020-06-30 DIAGNOSIS — I10 Essential (primary) hypertension: Secondary | ICD-10-CM | POA: Diagnosis not present

## 2020-06-30 DIAGNOSIS — Z8546 Personal history of malignant neoplasm of prostate: Secondary | ICD-10-CM | POA: Insufficient documentation

## 2020-06-30 LAB — PULMONARY FUNCTION TEST
DL/VA % pred: 64 %
DL/VA: 2.48 ml/min/mmHg/L
DLCO unc % pred: 51 %
DLCO unc: 14.37 ml/min/mmHg
FEF 25-75 Post: 3.2 L/sec
FEF 25-75 Pre: 2.88 L/sec
FEF2575-%Change-Post: 11 %
FEF2575-%Pred-Post: 127 %
FEF2575-%Pred-Pre: 114 %
FEV1-%Change-Post: 2 %
FEV1-%Pred-Post: 99 %
FEV1-%Pred-Pre: 96 %
FEV1-Post: 3.5 L
FEV1-Pre: 3.4 L
FEV1FVC-%Change-Post: 0 %
FEV1FVC-%Pred-Pre: 105 %
FEV6-%Change-Post: 2 %
FEV6-%Pred-Post: 97 %
FEV6-%Pred-Pre: 95 %
FEV6-Post: 4.47 L
FEV6-Pre: 4.37 L
FEV6FVC-%Change-Post: 0 %
FEV6FVC-%Pred-Post: 103 %
FEV6FVC-%Pred-Pre: 104 %
FVC-%Change-Post: 2 %
FVC-%Pred-Post: 94 %
FVC-%Pred-Pre: 91 %
FVC-Post: 4.57 L
FVC-Pre: 4.45 L
Post FEV1/FVC ratio: 77 %
Post FEV6/FVC ratio: 98 %
Pre FEV1/FVC ratio: 76 %
Pre FEV6/FVC Ratio: 98 %
RV % pred: 79 %
RV: 2.25 L
TLC % pred: 90 %
TLC: 7.09 L

## 2020-06-30 LAB — ECHOCARDIOGRAM COMPLETE
Area-P 1/2: 2.97 cm2
S' Lateral: 3.14 cm

## 2020-06-30 MED ORDER — ALBUTEROL SULFATE (2.5 MG/3ML) 0.083% IN NEBU
2.5000 mg | INHALATION_SOLUTION | Freq: Once | RESPIRATORY_TRACT | Status: AC
  Administered 2020-06-30: 2.5 mg via RESPIRATORY_TRACT

## 2020-06-30 NOTE — Telephone Encounter (Signed)
Attempted to reach patient, machine answered, left message again to call back.

## 2020-06-30 NOTE — Progress Notes (Signed)
*  PRELIMINARY RESULTS* Echocardiogram 2D Echocardiogram has been performed.  Leavy Cella 06/30/2020, 12:04 PM

## 2020-06-30 NOTE — Telephone Encounter (Signed)
New message    Patient returning call for echo results

## 2020-07-01 ENCOUNTER — Other Ambulatory Visit (HOSPITAL_COMMUNITY): Payer: Self-pay

## 2020-07-01 ENCOUNTER — Ambulatory Visit (HOSPITAL_COMMUNITY)
Admission: RE | Admit: 2020-07-01 | Discharge: 2020-07-01 | Disposition: A | Discharge status: Home / Self Care | Payer: Medicare Other | Source: Ambulatory Visit | Attending: Interventional Radiology | Admitting: Interventional Radiology

## 2020-07-01 ENCOUNTER — Other Ambulatory Visit: Payer: Self-pay

## 2020-07-01 ENCOUNTER — Encounter (HOSPITAL_COMMUNITY): Payer: Self-pay

## 2020-07-01 ENCOUNTER — Ambulatory Visit (HOSPITAL_COMMUNITY)
Admission: RE | Admit: 2020-07-01 | Discharge: 2020-07-01 | Disposition: A | Discharge status: Home / Self Care | Payer: Medicare Other | Source: Ambulatory Visit | Attending: Pulmonary Disease | Admitting: Pulmonary Disease

## 2020-07-01 DIAGNOSIS — R942 Abnormal results of pulmonary function studies: Secondary | ICD-10-CM | POA: Insufficient documentation

## 2020-07-01 DIAGNOSIS — J449 Chronic obstructive pulmonary disease, unspecified: Secondary | ICD-10-CM | POA: Diagnosis not present

## 2020-07-01 DIAGNOSIS — I4891 Unspecified atrial fibrillation: Secondary | ICD-10-CM | POA: Diagnosis not present

## 2020-07-01 DIAGNOSIS — I10 Essential (primary) hypertension: Secondary | ICD-10-CM | POA: Diagnosis not present

## 2020-07-01 DIAGNOSIS — R911 Solitary pulmonary nodule: Secondary | ICD-10-CM | POA: Diagnosis present

## 2020-07-01 DIAGNOSIS — J95811 Postprocedural pneumothorax: Secondary | ICD-10-CM | POA: Diagnosis present

## 2020-07-01 DIAGNOSIS — G4730 Sleep apnea, unspecified: Secondary | ICD-10-CM | POA: Insufficient documentation

## 2020-07-01 LAB — CBC
HCT: 50 % (ref 39.0–52.0)
Hemoglobin: 16.2 g/dL (ref 13.0–17.0)
MCH: 30.2 pg (ref 26.0–34.0)
MCHC: 32.4 g/dL (ref 30.0–36.0)
MCV: 93.3 fL (ref 80.0–100.0)
Platelets: 157 10*3/uL (ref 150–400)
RBC: 5.36 MIL/uL (ref 4.22–5.81)
RDW: 13.7 % (ref 11.5–15.5)
WBC: 6.9 10*3/uL (ref 4.0–10.5)
nRBC: 0 % (ref 0.0–0.2)

## 2020-07-01 LAB — PROTIME-INR
INR: 1 (ref 0.8–1.2)
Prothrombin Time: 12.9 seconds (ref 11.4–15.2)

## 2020-07-01 MED ORDER — MIDAZOLAM HCL 2 MG/2ML IJ SOLN
INTRAMUSCULAR | Status: AC
  Filled 2020-07-01: qty 2

## 2020-07-01 MED ORDER — FENTANYL CITRATE (PF) 100 MCG/2ML IJ SOLN
INTRAMUSCULAR | Status: AC | PRN
  Administered 2020-07-01: 50 ug via INTRAVENOUS

## 2020-07-01 MED ORDER — FENTANYL CITRATE (PF) 100 MCG/2ML IJ SOLN
INTRAMUSCULAR | Status: AC
  Filled 2020-07-01: qty 2

## 2020-07-01 MED ORDER — SODIUM CHLORIDE 0.9 % IV SOLN: INTRAVENOUS | Status: DC

## 2020-07-01 MED ORDER — MIDAZOLAM HCL 2 MG/2ML IJ SOLN
INTRAMUSCULAR | Status: AC | PRN
  Administered 2020-07-01: 1 mg via INTRAVENOUS

## 2020-07-01 NOTE — H&P (Signed)
Chief Complaint: Patient was seen in consultation today for left lung mass biopsy at the request of Sood,Vineet  Referring Physician(s): Sood,Vineet  Supervising Physician: Corrie Mckusick  Patient Status: [facility] - Out-pt  History of Present Illness: Jonathan Greene is a 79 y.o. male   Remote Hx prostate cancer Quit smoking 1993  Left lung mass noted on CXR when diagnosed with PNA 05/2020 Follow up CT  06/03/20:IMPRESSION: 1. Left upper lobe subpleural consolidation versus mass. Clinical correlation and close follow-up recommended. 2. 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 Similar size of a left upper lobe pulmonary nodule, which is hypermetabolic and consistent with primary bronchogenic carcinoma. 2. AP window diminutive but hypermetabolic node, suspicious for ipsilateral mediastinal nodal metastasis. 3. No hypermetabolic extrathoracic metastatic disease. 4. Calcified right cerebral cortical base lesion is suboptimally evaluated. 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 5. Right parietal encephalomalacia, possibly related to remote infarct.  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 This was present on a CT study of 2015 but measured larger at that time, and may have undergone additional thrombosis. 4 mm focus of enhancement at the inferolateral margin of the left temporal lobe. This is possibly a small superficial metastasis. I am not certain of this however, and it could possibly represent a surface vessel. This could either be followed or reimaged in higher detail.  Referred to Dr Halford Chessman Now request for biopsy of LUL mass  LD Xarelto 7/26  Past Medical History:  Diagnosis Date  . A-fib ([place])   . Bradycardia   . COPD (chronic obstructive pulmonary disease) ([place])   . Frequent  urination   . GERD (gastroesophageal reflux disease)   . Hypercholesterolemia   . Hypertension   . Prostate cancer ([place])    RADIATION [facility] IMPLANTS 02-28-12  . Sleep apnea   . Stroke ([place])    2011;ST memory loss, no other deficits.  . Urinary urgency     Past Surgical History:  Procedure Laterality Date  . CATARACT EXTRACTION W/PHACO Right 11/06/2013   Procedure: CATARACT EXTRACTION PHACO AND INTRAOCULAR LENS PLACEMENT ([place]);  Surgeon: Tonny Branch, MD;  Location: [facility];  Service: Ophthalmology;  Laterality: Right;  CDE 11.67  . CATARACT EXTRACTION W/PHACO Left 11/25/2015   Procedure: CATARACT EXTRACTION PHACO AND INTRAOCULAR LENS PLACEMENT ; CDE:  8.15;  Surgeon: Tonny Branch, MD;  Location: [facility];  Service: Ophthalmology;  Laterality: Left;  . COLONOSCOPY  10/03/2006   IFO:YDXAJOINOM rectal polyps cold biopsied/removed, otherwise normal rectum/Sigmoid diverticula.  Remainder of colon mucosa appeared normal  . COLONOSCOPY N/A 08/19/2014   RMR: Radiation proctitis-status post APC ablation. Colonic diverticulosis.  . CYSTOSCOPY  02/28/2012   Procedure: CYSTOSCOPY;  Surgeon: Bernestine Amass, MD;  Location: [facility];  Service: Urology;  Laterality: N/A;   no seeds noted in bladder  . RADIOACTIVE SEED IMPLANT  02/28/2012   Procedure: RADIOACTIVE SEED IMPLANT;  Surgeon: Bernestine Amass, MD;  Location: [facility];  Service: Urology;  Laterality: N/A;  67seeds implanted     Allergies: Patient has no known allergies.  Medications: Prior to Admission medications   Medication Sig Start Date End Date Taking? Authorizing Provider  albuterol (VENTOLIN HFA) 108 (90 Base) MCG/ACT inhaler Inhale 2 puffs into the lungs every 6 (six) hours as needed for wheezing or shortness of  breath. 06/09/20  Yes Chesley Mires, MD  b complex vitamins tablet Take 1 tablet by mouth daily.   Yes [provider]  escitalopram (LEXAPRO) 10 MG tablet Take 10 mg by  mouth daily.   Yes [provider]  Melatonin 10 MG CAPS Take 10 mg by mouth at bedtime.    Yes [provider]  omeprazole (PRILOSEC) 20 MG capsule Take 20 mg by mouth daily.    Yes [provider]  rivaroxaban (XARELTO) 20 MG TABS tablet Take 1 tablet (20 mg total) by mouth daily with supper. 03/16/15  Yes BranchAlphonse Guild, MD  simvastatin (ZOCOR) 40 MG tablet Take 40 mg by mouth at bedtime.  12/15/14  Yes [provider]  solifenacin (VESICARE) 10 MG tablet Take 10 mg by mouth daily.  02/05/19  Yes [provider]  tamsulosin (FLOMAX) 0.4 MG CAPS capsule Take 0.4 mg by mouth daily.   Yes [provider]  triamcinolone cream (KENALOG) 0.1 % Apply topically. 06/08/20  Yes [provider]  simethicone (GAS-X) 80 MG chewable tablet Chew 80 mg by mouth once as needed for flatulence. Reported on 06/20/2016 Patient not taking: Reported on 06/28/2020    [provider]     Family History  Problem Relation Age of Onset  . Stroke Mother   . Hypertension Mother   . Colon cancer Sister        in her 1s  . Breast cancer Sister   . Heart attack Brother   . COPD Maternal Aunt   . COPD Sister   . Hypertension Sister   . Pneumonia Sister   . COPD Son   . Cancer Neg Hx     Social History   Socioeconomic History  . Marital status: Married    Spouse name: Vaughan Basta  . Number of children: 2  . Years of education: Not on file  . Highest education level: Not on file  Occupational History  . Occupation: retired  Tobacco Use  . Smoking status: Former Smoker    Packs/day: 2.00    Years: 35.00    Pack years: 70.00    Types: Cigarettes    Quit date: 02/21/1991    Years since quitting: 29.3  . Smokeless tobacco: Current User    Types: Chew    Last attempt to quit: 03/11/2016  . Tobacco comment: CHEW TOBACCO FOR 20 YRS   Substance and Sexual Activity  . Alcohol use: Yes    Alcohol/week: 14.0 standard drinks    Types: 14 Shots of  liquor per week    Comment: 4 ounces daily  . Drug use: No  . Sexual activity: Yes    Birth control/protection: None  Other Topics Concern  . Not on file  Social History Narrative  . Not on file   Social Determinants of Health   Financial Resource Strain: Low Risk   . Difficulty of Paying Living Expenses: Not hard at all  Food Insecurity: No Food Insecurity  . Worried About Charity fundraiser in the Last Year: Never true  . Ran Out of Food in the Last Year: Never true  Transportation Needs: No Transportation Needs  . Lack of Transportation (Medical): No  . Lack of Transportation (Non-Medical): No  Physical Activity:   . Days of Exercise per Week:   . Minutes of Exercise per Session:   Stress: No Stress Concern Present  . Feeling of Stress : Not at all  Social Connections: Moderately Isolated  .  Frequency of Communication with Friends and Family: More than three times a week  . Frequency of Social Gatherings with Friends and Family: More than three times a week  . Attends Religious Services: Never  . Active Member of Clubs or Organizations: No  . Attends Archivist Meetings: Never  . Marital Status: Married    Review of Systems: A 12 point ROS discussed and pertinent positives are indicated in the HPI above.  All other systems are negative.  Review of Systems  Constitutional: Negative for activity change, fatigue and fever.  Respiratory: Negative for cough and shortness of breath.   Cardiovascular: Negative for chest pain.  Gastrointestinal: Negative for abdominal pain.  Neurological: Negative for weakness.  Psychiatric/Behavioral: Negative for behavioral problems and confusion.    Vital Signs: BP (!) 153/92   Pulse 63   Temp 97.9 F (36.6 C) (Oral)   Resp 16   Ht 6\' 2"  (1.88 m)   Wt (!) 225 lb (102.1 kg)   SpO2 99%   BMI 28.89 kg/m   Physical Exam Vitals reviewed.  Cardiovascular:     Rate and Rhythm: Normal rate. Rhythm irregular.     Heart  sounds: Normal heart sounds.  Pulmonary:     Effort: Pulmonary effort is normal.     Breath sounds: Normal breath sounds.  Abdominal:     Palpations: Abdomen is soft.     Tenderness: There is no abdominal tenderness.  Musculoskeletal:        General: Normal range of motion.  Skin:    General: Skin is warm.  Neurological:     Mental Status: He is alert and oriented to person, place, and time.  Psychiatric:        Behavior: Behavior normal.     Imaging: CT CHEST WO CONTRAST  Result Date: 06/03/2020 CLINICAL DATA:  79 year old male with shortness of breath. EXAM: CT CHEST WITHOUT CONTRAST TECHNIQUE: Multidetector CT imaging of the chest was performed following the standard protocol without IV contrast. COMPARISON:  Chest radiograph dated 05/12/2020 and chest CT dated 06/09/2015. FINDINGS: Evaluation of this exam is limited in the absence of intravenous contrast. Cardiovascular: There is no cardiomegaly or pericardial effusion. Three-vessel coronary vascular calcification. There is moderate atherosclerotic calcification of the thoracic aorta. The ascending aorta measures 4.3 cm in diameter. The central pulmonary arteries are grossly unremarkable. Mediastinum/Nodes: There is no hilar or mediastinal adenopathy. The esophagus is grossly unremarkable. No mediastinal fluid collection. Lungs/Pleura: Severe emphysema. There is a 2.3 x 2.2 x 4.4 cm subpleural masslike consolidation in the left upper lobe corresponding to the density seen on the prior radiograph. This is not characterized on this CT and may represent pneumonia or a neoplastic process. Clinical correlation and close follow-up recommended. Pulmonary consult/referral is advised. There is no pleural effusion or pneumothorax. The central airways are patent. Upper Abdomen: No acute abnormality. Musculoskeletal: Degenerative changes of the spine. No acute osseous pathology. IMPRESSION: 1. Left upper lobe subpleural consolidation versus mass.  Clinical correlation and close follow-up recommended. 2. 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 Recommend annual imaging followup by CTA or MRA. This recommendation follows 2010 ACCF/AHA/AATS/ACR/ASA/SCA/SCAI/SIR/STS/SVM Guidelines for the Diagnosis and Management of Patients with Thoracic Aortic Disease. Circulation. 2010; 121: I951-O841. Aortic aneurysm NOS (ICD10-I71.9) 3. 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 Electronically Signed   By: Anner Crete M.D.   On: 06/03/2020 19:25   MR Brain W Wo Contrast  Result Date: 06/23/2020 CLINICAL DATA:  New diagnosis lung mass.  Staging. EXAM: MRI HEAD WITHOUT AND WITH CONTRAST TECHNIQUE: Multiplanar, multiecho pulse sequences of the brain and surrounding structures were obtained without and with intravenous contrast. CONTRAST:  2mL GADAVIST GADOBUTROL 1 MMOL/ML IV SOLN COMPARISON:  CT 12/11/2013 FINDINGS: Brain: Diffusion imaging does not show any acute or subacute infarction or other cause of restricted diffusion. Chronic small-vessel changes affect the a dull a and pons. No focal cerebellar insult. Cerebral hemispheres show old infarction in the right posterior temporal lobe and temporoparietal junction with atrophy, encephalomalacia and gliosis. Patient does not show a pattern of widespread small-vessel disease. 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 This was seen in 2015 but appeared larger at that time. This could be re-evaluated with CT angiography or catheter angiography. It does not represent a neoplastic mass. After contrast administration, there is a single suspicious focus of enhancement along the inferolateral margin of the temporal lobe on the left measuring 4 mm, axial image 75. This is not well seen on the sagittal or coronal postcontrast imaging and I am not certain if this represents a small metastasis or a surface vessel. No  second suspicious focus is identified. Vascular: Major vessels at the base of the brain show flow. 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 This was present on a CT study of 2015 but measured larger at that time, and may have undergone additional thrombosis. 4 mm focus of enhancement at the inferolateral margin of the left temporal lobe. This is possibly a small superficial metastasis. I am not certain of this however, and it could possibly represent a surface vessel. This could either be followed or reimaged in higher detail. Electronically Signed   By: Nelson Chimes M.D.   On: 06/23/2020 22:00   NM PET Image Initial (PI) Skull Base To Thigh  Result Date: 06/16/2020 CLINICAL DATA:  Initial treatment strategy for left upper lobe lung lesion. COVID-19 vaccine in left arm 2 months ago. EXAM: NUCLEAR MEDICINE PET SKULL BASE TO THIGH TECHNIQUE: 11.3 mCi F-18 FDG was injected intravenously. Full-ring PET imaging was performed from the skull base to thigh after the radiotracer. CT data was obtained and used for attenuation correction and anatomic localization. Fasting blood glucose: 116 mg/dl COMPARISON:  Chest CT 06/03/2020 FINDINGS: Mediastinal blood pool activity: SUV max 2.8 Liver activity: SUV max NA NECK: Photopenia within the right parietal lobe corresponding to encephalomalacia on 04/04. No cervical nodal hypermetabolism. Incidental CT findings: A calcified cortical based right frontal/parietal lesion on 02/02 measures 1.0 cm. Advanced cerebral atrophy. No cervical adenopathy.  Bilateral carotid atherosclerosis. CHEST: Posterior left upper lobe pleural-based hypermetabolic nodule. 2.4 x 2.3 cm and a S.U.V. max of 6.2 on 29/8. This is similar in size to on the prior exam. Right infrahilar  hypermetabolism is favored to be vascular/physiologic. No correlate adenopathy. An AP window node measures 6 mm and a S.U.V. max of 4.3 on 74/4. Incidental CT findings: Mild ascending aortic dilatation including at 4.1 cm on 81/4. Tortuous thoracic aorta. Moderate cardiomegaly, without pericardial effusion. Tiny hiatal hernia.  Fluid or debris within the esophagus on 67/4. Centrilobular and paraseptal emphysema.  Bibasilar atelectasis. ABDOMEN/PELVIS: No abdominopelvic parenchymal or nodal hypermetabolism. Incidental CT findings: Normal adrenal glands. Mild renal cortical thinning bilaterally. Too small to characterize interpolar right renal  lesion. Mild hepatic steatosis. Scattered colonic diverticula. Radiation seeds in the prostate. Aortic atherosclerosis. 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 Moderate to large, minimally complex right-sided hydrocele. SKELETON: No abnormal marrow activity. Incidental CT findings: none IMPRESSION: 1. Similar size of a left upper lobe pulmonary nodule, which is hypermetabolic and consistent with primary bronchogenic carcinoma. 2. AP window diminutive but hypermetabolic node, suspicious for ipsilateral mediastinal nodal metastasis. 3. No hypermetabolic extrathoracic metastatic disease. 4. Calcified right cerebral cortical base lesion is suboptimally evaluated. 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 5. Right parietal encephalomalacia, possibly related to remote infarct. 6. Incidental findings, including: Aortic atherosclerosis (ICD10-I70.0), coronary artery atherosclerosis and emphysema (ICD10-J43.9). 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 Tiny hiatal hernia. Esophageal air fluid level suggests dysmotility or gastroesophageal reflux. 7. Mild ascending thoracic aortic aneurysm, as before. Electronically Signed   By: Abigail Miyamoto M.D.   On: 06/16/2020 15:16   ECHOCARDIOGRAM COMPLETE  Result Date: 06/30/2020     ECHOCARDIOGRAM REPORT   Patient Name:   KALIJAH ZEISS [facility] Date of Exam: 06/30/2020 Medical Rec #:  [id number]         Height:       74.0 in Accession #:    [id number]        Weight:       225.2 lb Date of Birth:  02/02/1941         BSA:          2.286 m Patient Age:    48 years          BP:           142/82 mmHg Patient Gender: M                 HR:           84 bpm. Exam Location:  [facility] Procedure: 2D Echo Indications:    Atrial Fibrillation 427.31 / I48.91  History:        Patient has prior history of Echocardiogram examinations, most                 recent 02/10/2015. Arrythmias:Atrial Fibrillation; Risk                 Factors:Former Smoker and Hypertension. Bradycardia, Prostate                 Cancer, Emphysema of Lung.  Sonographer:    Leavy Cella RDCS (AE) Referring Phys: [place]  1. Left ventricular ejection fraction, by estimation, is 55 to 60%. The left ventricle has normal function. The left ventricle has no regional wall motion abnormalities. There is moderate left ventricular hypertrophy. Left ventricular diastolic parameters are indeterminate in the setting of atrial fibrillation.  2. Right ventricular systolic function is normal. The right ventricular size is normal.  3. Left atrial size was mildly dilated.  4. The mitral valve is grossly normal. Trivial mitral valve regurgitation.  5. The aortic valve is tricuspid. Aortic valve regurgitation is not visualized. Mild aortic valve sclerosis is present, with no evidence of aortic valve stenosis.  6. The inferior vena cava is normal in size with greater than 50% respiratory variability, suggesting right atrial pressure of 3 mmHg. FINDINGS  Left Ventricle: Left ventricular ejection fraction, by estimation, is 55 to 60%. The left ventricle has normal function. The left ventricle has no regional wall motion abnormalities. The left ventricular internal cavity size was normal in  size. There is  moderate left ventricular  hypertrophy. Left ventricular diastolic parameters are indeterminate. Right Ventricle: The right ventricular size is normal. No increase in right ventricular wall thickness. Right ventricular systolic function is normal. Left Atrium: Left atrial size was mildly dilated. Right Atrium: Right atrial size was normal in size. Pericardium: There is no evidence of pericardial effusion. Mitral Valve: The mitral valve is grossly normal. Trivial mitral valve regurgitation. Tricuspid Valve: The tricuspid valve is grossly normal. Tricuspid valve regurgitation is trivial. Aortic Valve: The aortic valve is tricuspid. Aortic valve regurgitation is not visualized. Mild aortic valve sclerosis is present, with no evidence of aortic valve stenosis. Mild to moderate aortic valve annular calcification. Pulmonic Valve: The pulmonic valve was grossly normal. Pulmonic valve regurgitation is trivial. Aorta: The aortic root is normal in size and structure. Venous: The inferior vena cava is normal in size with greater than 50% respiratory variability, suggesting right atrial pressure of 3 mmHg. IAS/Shunts: No atrial level shunt detected by color flow Doppler.  LEFT VENTRICLE PLAX 2D LVIDd:         4.55 cm  Diastology LVIDs:         3.14 cm  LV e' lateral:   12.10 cm/s LV PW:         1.62 cm  LV E/e' lateral: 5.4 LV IVS:        1.38 cm  LV e' medial:    7.72 cm/s LVOT diam:     2.10 cm  LV E/e' medial:  8.4 LVOT Area:     3.46 cm  RIGHT VENTRICLE RV S prime:     12.70 cm/s TAPSE (M-mode): 2.4 cm LEFT ATRIUM             Index       RIGHT ATRIUM           Index LA diam:        5.40 cm 2.36 cm/m  RA Area:     18.00 cm LA Vol (A2C):   72.5 ml 31.72 ml/m RA Volume:   56.10 ml  24.54 ml/m LA Vol (A4C):   88.4 ml 38.67 ml/m LA Biplane Vol: 80.4 ml 35.17 ml/m   AORTA Ao Root diam: 3.80 cm MITRAL VALVE MV Area (PHT): 2.97 cm    SHUNTS MV Decel Time: 255 msec    Systemic Diam: 2.10 cm MV E velocity: 65.10 cm/s Rozann Lesches MD Electronically  signed by Rozann Lesches MD Signature Date/Time: 06/30/2020/12:20:01 PM    Final     Labs:  CBC: Recent Labs    12/31/19 0952 07/01/20 0910  WBC 6.8 6.9  HGB 15.4 16.2  HCT 45.6 50.0  PLT 184 157    COAGS: Recent Labs    07/01/20 0910  INR 1.0    BMP: No results for input(s): NA, K, CL, CO2, GLUCOSE, BUN, CALCIUM, CREATININE, GFRNONAA, GFRAA in the last 8760 hours.  Invalid input(s): CMP  LIVER FUNCTION TESTS: No results for input(s): BILITOT, AST, ALT, ALKPHOS, PROT, ALBUMIN in the last 8760 hours.  TUMOR MARKERS: No results for input(s): AFPTM, CEA, CA199, CHROMGRNA in the last 8760 hours.  Assessment and Plan:  LUL mass; +PET Remote Hx Prostate ca Scheduled for lung mass biopsy Last dose Xarelto 7/26 Risks and benefits of CT guided lung nodule biopsy was discussed with the patient including, but not limited to bleeding, hemoptysis, respiratory failure requiring intubation, infection, pneumothorax requiring chest tube placement, stroke from air embolism or even death.  All of  the patient's questions were answered and the patient is agreeable to proceed. Consent signed and in chart.    Thank you for this interesting consult.  I greatly enjoyed meeting Jonathan Greene and look forward to participating in their care.  A copy of this report was sent to the requesting provider on this date.  Electronically Signed: Lavonia Drafts, PA-C 07/01/2020, 10:09 AM   I spent a total of  30 Minutes   in face to face in clinical consultation, greater than 50% of which was counseling/coordinating care for LUL mass bx

## 2020-07-01 NOTE — Discharge Instructions (Signed)
Lung Biopsy, Care After This sheet gives you information about how to care for yourself after your procedure. Your health care provider may also give you more specific instructions depending on the type of biopsy you had. If you have problems or questions, contact your health care provider. What can I expect after the procedure? After the procedure, it is common to have:  A cough.  A sore throat.  Pain where a needle, bronchoscope, or incision was used to collect a biopsy sample (biopsy site). Follow these instructions at home: Medicines  Take over-the-counter and prescription medicines only as told by your health care provider.  Do not drink alcohol if your health care provider tells you not to drink.  Ask your health care provider if the medicine prescribed to you: ? Requires you to avoid driving or using heavy machinery. ? Can cause constipation. You may need to take these actions to prevent or treat constipation:  Drink enough fluid to keep your urine pale yellow.  Take over-the-counter or prescription medicines.  Eat foods that are high in fiber, such as beans, whole grains, and fresh fruits and vegetables.  Limit foods that are high in fat and processed sugars, such as fried or sweet foods.  Do not drive for 24 hours if you were given a sedative. Biopsy site care   Follow instructions from your health care provider about how to take care of your biopsy site. Make sure you: ? Wash your hands with soap and water before and after you change your bandage (dressing). If soap and water are not available, use hand sanitizer. ? Change your dressing as told by your health care provider. ? Leave stitches (sutures), skin glue, or adhesive strips in place. These skin closures may need to stay in place for 2 weeks or longer. If adhesive strip edges start to loosen and curl up, you may trim the loose edges. Do not remove adhesive strips completely unless your health care provider tells  you to do that.  Do not take baths, swim, or use a hot tub until your health care provider approves. Ask your health care provider if you may take showers. You may only be allowed to take sponge baths.  Check your biopsy site every day for signs of infection. Check for: ? Redness, swelling, or more pain. ? Fluid or blood. ? Warmth. ? Pus or a bad smell. General instructions  Return to your normal activities as told by your health care provider. Ask your health care provider what activities are safe for you.  It is up to you to get the results of your procedure. Ask your health care provider, or the department that is doing the procedure, when your results will be ready.  Keep all follow-up visits as told by your health care provider. This is important. Contact a health care provider if:  You have a fever.  You have redness, swelling, or more pain around your biopsy site.  You have fluid or blood coming from your biopsy site.  Your biopsy site feels warm to the touch.  You have pus or a bad smell coming from your biopsy site.  You have pain that does not get better with medicine. Get help right away if:  You cough up blood.  You have trouble breathing.  You have chest pain.  You lose consciousness. Summary  After the procedure, it is common to have a sore throat and a cough.  Return to your normal activities as told by your  health care provider. Ask your health care provider what activities are safe for you.  Take over-the-counter and prescription medicines only as told by your health care provider.  Report any unusual symptoms to your health care provider. This information is not intended to replace advice given to you by your health care provider. Make sure you discuss any questions you have with your health care provider. Document Revised: 12/25/2018 Document Reviewed: 12/19/2016 Elsevier Patient Education  [place].  Moderate Conscious Sedation, Adult,  Care After These instructions provide you with information about caring for yourself after your procedure. Your health care provider may also give you more specific instructions. Your treatment has been planned according to current medical practices, but problems sometimes occur. Call your health care provider if you have any problems or questions after your procedure. What can I expect after the procedure? After your procedure, it is common:  To feel sleepy for several hours.  To feel clumsy and have poor balance for several hours.  To have poor judgment for several hours.  To vomit if you eat too soon. Follow these instructions at home: For at least 24 hours after the procedure:   Do not: ? Participate in activities where you could fall or become injured. ? Drive. ? Use heavy machinery. ? Drink alcohol. ? Take sleeping pills or medicines that cause drowsiness. ? Make important decisions or sign legal documents. ? Take care of children on your own.  Rest. Eating and drinking  Follow the diet recommended by your health care provider.  If you vomit: ? Drink water, juice, or soup when you can drink without vomiting. ? Make sure you have little or no nausea before eating solid foods. General instructions  Have a responsible adult stay with you until you are awake and alert.  Take over-the-counter and prescription medicines only as told by your health care provider.  If you smoke, do not smoke without supervision.  Keep all follow-up visits as told by your health care provider. This is important. Contact a health care provider if:  You keep feeling nauseous or you keep vomiting.  You feel light-headed.  You develop a rash.  You have a fever. Get help right away if:  You have trouble breathing. This information is not intended to replace advice given to you by your health care provider. Make sure you discuss any questions you have with your health care  provider. Document Revised: 11/02/2017 Document Reviewed: 03/11/2016 Elsevier Patient Education  2020 [employer].

## 2020-07-01 NOTE — Procedures (Addendum)
Interventional Radiology Procedure Note  Procedure: CT guided biopsy of left lung mass/nodule Complications: None Recommendations: - Bedrest until CXR cleared.  Minimize talking, coughing or otherwise straining.  - Follow up 1 hr CXR pending  - NPO until CXR cleared  Signed,  Corrie Mckusick, DO

## 2020-07-06 LAB — SURGICAL PATHOLOGY

## 2020-07-13 ENCOUNTER — Inpatient Hospital Stay (HOSPITAL_COMMUNITY): Payer: Medicare Other | Attending: Hematology | Admitting: Hematology

## 2020-07-13 ENCOUNTER — Other Ambulatory Visit: Payer: Self-pay

## 2020-07-13 VITALS — BP 147/88 | HR 97 | Temp 97.3°F | Resp 18 | Wt 226.6 lb

## 2020-07-13 DIAGNOSIS — I712 Thoracic aortic aneurysm, without rupture: Secondary | ICD-10-CM | POA: Insufficient documentation

## 2020-07-13 DIAGNOSIS — Z87891 Personal history of nicotine dependence: Secondary | ICD-10-CM | POA: Insufficient documentation

## 2020-07-13 DIAGNOSIS — Z7901 Long term (current) use of anticoagulants: Secondary | ICD-10-CM | POA: Insufficient documentation

## 2020-07-13 DIAGNOSIS — Z8546 Personal history of malignant neoplasm of prostate: Secondary | ICD-10-CM | POA: Diagnosis not present

## 2020-07-13 DIAGNOSIS — R918 Other nonspecific abnormal finding of lung field: Secondary | ICD-10-CM | POA: Diagnosis not present

## 2020-07-13 DIAGNOSIS — Z77090 Contact with and (suspected) exposure to asbestos: Secondary | ICD-10-CM | POA: Diagnosis not present

## 2020-07-13 DIAGNOSIS — Z79899 Other long term (current) drug therapy: Secondary | ICD-10-CM | POA: Diagnosis not present

## 2020-07-13 DIAGNOSIS — I4891 Unspecified atrial fibrillation: Secondary | ICD-10-CM | POA: Diagnosis not present

## 2020-07-13 DIAGNOSIS — J449 Chronic obstructive pulmonary disease, unspecified: Secondary | ICD-10-CM | POA: Diagnosis not present

## 2020-07-13 DIAGNOSIS — Z8701 Personal history of pneumonia (recurrent): Secondary | ICD-10-CM | POA: Diagnosis not present

## 2020-07-13 DIAGNOSIS — G4730 Sleep apnea, unspecified: Secondary | ICD-10-CM | POA: Insufficient documentation

## 2020-07-13 DIAGNOSIS — Z923 Personal history of irradiation: Secondary | ICD-10-CM | POA: Diagnosis not present

## 2020-07-13 MED ORDER — AMOXICILLIN-POT CLAVULANATE 875-125 MG PO TABS
1.0000 | ORAL_TABLET | Freq: Two times a day (BID) | ORAL | 0 refills | Status: DC
Start: 2020-07-13 — End: 2020-07-29

## 2020-07-13 MED ORDER — LEVOFLOXACIN 500 MG PO TABS
500.0000 mg | ORAL_TABLET | Freq: Every day | ORAL | 0 refills | Status: DC
Start: 2020-07-13 — End: 2020-07-13

## 2020-07-13 NOTE — Progress Notes (Signed)
Jonathan Greene, [place] [address]   CLINIC:  Medical Oncology/Hematology  PCP:  Celene Squibb, MD [address] [name] [place] [address] [phone]   REASON FOR VISIT:  Follow-up for left upper lobe lung mass  PRIOR THERAPY: None  NGS Results: Not done  CURRENT THERAPY: Under work-up  BRIEF ONCOLOGIC HISTORY:  Oncology History   No history exists.    CANCER STAGING: Cancer Staging Prostate cancer [facility] Staging form: Prostate, AJCC 7th Edition - Clinical: Stage IIA (T2a, N0, M0) - Signed by Rexene Edison, MD on 12/07/2011 - Pathologic: No stage assigned - Unsigned   INTERVAL HISTORY:  Jonathan Greene, a 79 y.o. male, returns for routine follow-up of his left upper lobe lung mass. Jonathan Greene was last seen on 06/28/2020.  Today he is accompanied by his wife and grandson. He complains of fatigue and continues having SOB with exertion. He denies having any pain in his chest wall, but he coughs up thick green sputum with specks of blood which started when he had pneumonia around June 2021; he was treated possibly with a Z-Pack and prednisone.   REVIEW OF SYSTEMS:  Review of Systems  Constitutional: Positive for fatigue (severe). Negative for appetite change.  Respiratory: Positive for cough (productive cough) and shortness of breath (w/ exertion).   Cardiovascular: Negative for chest pain.  Neurological: Positive for dizziness.  All other systems reviewed and are negative.   PAST MEDICAL/SURGICAL HISTORY:  Past Medical History:  Diagnosis Date  . A-fib ([place])   . Bradycardia   . COPD (chronic obstructive pulmonary disease) ([place])   . Frequent urination   . GERD (gastroesophageal reflux disease)   . Hypercholesterolemia   . Hypertension   . Prostate cancer ([place])    RADIATION [place] IMPLANTS 02-28-12  . Sleep apnea   . Stroke ([place])    2011;ST memory loss, no other deficits.  . Urinary urgency    Past  Surgical History:  Procedure Laterality Date  . CATARACT EXTRACTION W/PHACO Right 11/06/2013   Procedure: CATARACT EXTRACTION PHACO AND INTRAOCULAR LENS PLACEMENT ([place]);  Surgeon: Tonny Branch, MD;  Location: [facility];  Service: Ophthalmology;  Laterality: Right;  CDE 11.67  . CATARACT EXTRACTION W/PHACO Left 11/25/2015   Procedure: CATARACT EXTRACTION PHACO AND INTRAOCULAR LENS PLACEMENT ; CDE:  8.15;  Surgeon: Tonny Branch, MD;  Location: [facility];  Service: Ophthalmology;  Laterality: Left;  . COLONOSCOPY  10/03/2006   KGM:[id number] rectal polyps cold biopsied/removed, otherwise normal rectum/Sigmoid diverticula.  Remainder of colon mucosa appeared normal  . COLONOSCOPY N/A 08/19/2014   RMR: Radiation proctitis-status post APC ablation. Colonic diverticulosis.  . CYSTOSCOPY  02/28/2012   Procedure: CYSTOSCOPY;  Surgeon: Bernestine Amass, MD;  Location: [facility];  Service: Urology;  Laterality: N/A;   no seeds noted in bladder  . RADIOACTIVE SEED IMPLANT  02/28/2012   Procedure: RADIOACTIVE SEED IMPLANT;  Surgeon: Bernestine Amass, MD;  Location[facility];  Service: Urology;  Laterality: N/A;  67seeds implanted     SOCIAL HISTORY:  Social History   Socioeconomic History  . Marital status: Married    Spouse name: Jonathan Greene  . Number of children: 2  . Years of education: Not on file  . Highest education level: Not on file  Occupational History  . Occupation: retired  Tobacco Use  . Smoking status: Former Smoker    Packs/day: 2.00  Years: 35.00    Pack years: 70.00    Types: Cigarettes    Quit date: 02/21/1991    Years since quitting: 29.4  . Smokeless tobacco: Current User    Types: Chew    Last attempt to quit: 03/11/2016  . Tobacco comment: CHEW TOBACCO FOR 20 YRS   Substance and Sexual Activity  . Alcohol use: Yes    Alcohol/week: 14.0 standard drinks    Types: 14 Shots of liquor per week    Comment: 4 ounces daily  . Drug use: No  . Sexual activity:  Yes    Birth control/protection: None  Other Topics Concern  . Not on file  Social History Narrative  . Not on file   Social Determinants of Health   Financial Resource Strain: Low Risk   . Difficulty of Paying Living Expenses: Not hard at all  Food Insecurity: No Food Insecurity  . Worried About Charity fundraiser in the Last Year: Never true  . Ran Out of Food in the Last Year: Never true  Transportation Needs: No Transportation Needs  . Lack of Transportation (Medical): No  . Lack of Transportation (Non-Medical): No  Physical Activity:   . Days of Exercise per Week:   . Minutes of Exercise per Session:   Stress: No Stress Concern Present  . Feeling of Stress : Not at all  Social Connections: Moderately Isolated  . Frequency of Communication with Friends and Family: More than three times a week  . Frequency of Social Gatherings with Friends and Family: More than three times a week  . Attends Religious Services: Never  . Active Member of Clubs or Organizations: No  . Attends Archivist Meetings: Never  . Marital Status: Married  Human resources officer Violence: Not At Risk  . Fear of Current or Ex-Partner: No  . Emotionally Abused: No  . Physically Abused: No  . Sexually Abused: No    FAMILY HISTORY:  Family History  Problem Relation Age of Onset  . Stroke Mother   . Hypertension Mother   . Colon cancer Sister        in her 54s  . Breast cancer Sister   . Heart attack Brother   . COPD Maternal Aunt   . COPD Sister   . Hypertension Sister   . Pneumonia Sister   . COPD Son   . Cancer Neg Hx     CURRENT MEDICATIONS:  Current Outpatient Medications  Medication Sig Dispense Refill  . albuterol (VENTOLIN HFA) 108 (90 Base) MCG/ACT inhaler Inhale 2 puffs into the lungs every 6 (six) hours as needed for wheezing or shortness of breath. 6.7 g 5  . b complex vitamins tablet Take 1 tablet by mouth daily.    Marland Kitchen escitalopram (LEXAPRO) 10 MG tablet Take 10 mg by mouth  daily.    . Melatonin 10 MG CAPS Take 10 mg by mouth at bedtime.     Marland Kitchen omeprazole (PRILOSEC) 20 MG capsule Take 20 mg by mouth daily.     . rivaroxaban (XARELTO) 20 MG TABS tablet Take 1 tablet (20 mg total) by mouth daily with supper. 90 tablet 4  . simethicone (GAS-X) 80 MG chewable tablet Chew 80 mg by mouth once as needed for flatulence. Reported on 06/20/2016    . simvastatin (ZOCOR) 40 MG tablet Take 40 mg by mouth at bedtime.     . solifenacin (VESICARE) 10 MG tablet Take 10 mg by mouth daily.     Marland Kitchen  tamsulosin (FLOMAX) 0.4 MG CAPS capsule Take 0.4 mg by mouth daily.    Marland Kitchen triamcinolone cream (KENALOG) 0.1 % Apply topically.     No current facility-administered medications for this visit.    ALLERGIES:  No Known Allergies  PHYSICAL EXAM:  Performance status (ECOG): 1 - Symptomatic but completely ambulatory  Vitals:   07/13/20 1157  BP: (!) 147/88  Pulse: 97  Resp: 18  Temp: (!) 97.3 F (36.3 C)  SpO2: 97%   Wt Readings from Last 3 Encounters:  07/13/20 226 lb 9.6 oz (102.8 kg)  07/01/20 (!) 225 lb (102.1 kg)  06/28/20 (!) 225 lb 3.2 oz (102.2 kg)   Physical Exam Vitals reviewed.  Constitutional:      Appearance: Normal appearance.  Cardiovascular:     Rate and Rhythm: Normal rate and regular rhythm.     Pulses: Normal pulses.     Heart sounds: Normal heart sounds.  Pulmonary:     Effort: Pulmonary effort is normal.     Breath sounds: Normal breath sounds.  Neurological:     General: No focal deficit present.     Mental Status: He is alert and oriented to person, place, and time.  Psychiatric:        Mood and Affect: Mood normal.        Behavior: Behavior normal.      LABORATORY DATA:  I have reviewed the labs as listed.  CBC Latest Ref Rng & Units 07/01/2020 12/31/2019 01/20/2017  WBC 4.0 - 10.5 K/uL 6.9 6.8 11.6(H)  Hemoglobin 13.0 - 17.0 g/dL 16.2 15.4 15.2  Hematocrit 39 - 52 % 50.0 45.6 45.7  Platelets 150 - 400 K/uL 157 184 165   CMP Latest Ref Rng &  Units 01/20/2017 02/29/2016 06/09/2015  Glucose 65 - 99 mg/dL 154(H) 111(H) -  BUN 6 - 20 mg/dL 18 32(H) -  Creatinine 0.61 - 1.24 mg/dL 1.25(H) 1.63(H) 1.30(H)  Sodium 135 - 145 mmol/L 139 135 -  Potassium 3.5 - 5.1 mmol/L 4.4 3.5 -  Chloride 101 - 111 mmol/L 103 104 -  CO2 22 - 32 mmol/L 28 21(L) -  Calcium 8.9 - 10.3 mg/dL 9.6 8.5(L) -  Total Protein 6.5 - 8.1 g/dL 7.5 8.1 -  Total Bilirubin 0.3 - 1.2 mg/dL 0.9 0.9 -  Alkaline Phos 38 - 126 U/L 69 73 -  AST 15 - 41 U/L 55(H) 63(H) -  ALT 17 - 63 U/L 42 64(H) -   Surgical pathology ([id number]) from 07/01/2020: LUL lung nodule: benign lung with inflammation and fibrosis  DIAGNOSTIC IMAGING:  I have independently reviewed the scans and discussed with the patient. MR Brain W Wo Contrast  Result Date: 06/23/2020 CLINICAL DATA:  New diagnosis lung mass.  Staging. EXAM: MRI HEAD WITHOUT AND WITH CONTRAST TECHNIQUE: Multiplanar, multiecho pulse sequences of the brain and surrounding structures were obtained without and with intravenous contrast. CONTRAST:  14mL GADAVIST GADOBUTROL 1 MMOL/ML IV SOLN COMPARISON:  CT 12/11/2013 FINDINGS: Brain: Diffusion imaging does not show any acute or subacute infarction or other cause of restricted diffusion. Chronic small-vessel changes affect the a dull a and pons. No focal cerebellar insult. Cerebral hemispheres show old infarction in the right posterior temporal lobe and temporoparietal junction with atrophy, encephalomalacia and gliosis. Patient does not show a pattern of widespread small-vessel disease. 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 This was seen in 2015 but appeared larger at  that time. This could be re-evaluated with CT angiography or catheter angiography. It does not represent a neoplastic mass. After contrast administration, there is a single suspicious focus of enhancement along the inferolateral margin of the  temporal lobe on the left measuring 4 mm, axial image 75. This is not well seen on the sagittal or coronal postcontrast imaging and I am not certain if this represents a small metastasis or a surface vessel. No second suspicious focus is identified. Vascular: Major vessels at the base of the brain show flow. 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 This was present on a CT study of 2015 but measured larger at that time, and may have undergone additional thrombosis. 4 mm focus of enhancement at the inferolateral margin of the left temporal lobe. This is possibly a small superficial metastasis. I am not certain of this however, and it could possibly represent a surface vessel. This could either be followed or reimaged in higher detail. Electronically Signed   By: Nelson Chimes M.D.   On: 06/23/2020 22:00   NM PET Image Initial (PI) Skull Base To Thigh  Result Date: 06/16/2020 CLINICAL DATA:  Initial treatment strategy for left upper lobe lung lesion. COVID-19 vaccine in left arm 2 months ago. EXAM: NUCLEAR MEDICINE PET SKULL BASE TO THIGH TECHNIQUE: 11.3 mCi F-18 FDG was injected intravenously. Full-ring PET imaging was performed from the skull base to thigh after the radiotracer. CT data was obtained and used for attenuation correction and anatomic localization. Fasting blood glucose: 116 mg/dl COMPARISON:  Chest CT 06/03/2020 FINDINGS: Mediastinal blood pool activity: SUV max 2.8 Liver activity: SUV max NA NECK: Photopenia within the right parietal lobe corresponding to encephalomalacia on 04/04. No cervical nodal hypermetabolism. Incidental CT findings: A calcified cortical based right frontal/parietal lesion on 02/02 measures 1.0 cm. Advanced cerebral atrophy. No cervical adenopathy.  Bilateral  carotid atherosclerosis. CHEST: Posterior left upper lobe pleural-based hypermetabolic nodule. 2.4 x 2.3 cm and a S.U.V. max of 6.2 on 29/8. This is similar in size to on the prior exam. Right infrahilar hypermetabolism is favored to be vascular/physiologic. No correlate adenopathy. An AP window node measures 6 mm and a S.U.V. max of 4.3 on 74/4. Incidental CT findings: Mild ascending aortic dilatation including at 4.1 cm on 81/4. Tortuous thoracic aorta. Moderate cardiomegaly, without pericardial effusion. Tiny hiatal hernia.  Fluid or debris within the esophagus on 67/4. Centrilobular and paraseptal emphysema.  Bibasilar atelectasis. ABDOMEN/PELVIS: No abdominopelvic parenchymal or nodal hypermetabolism. Incidental CT findings: Normal adrenal glands. Mild renal cortical thinning bilaterally. Too small to characterize interpolar right renal lesion. Mild hepatic steatosis. Scattered colonic diverticula. Radiation seeds in the prostate. Aortic atherosclerosis. 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 Moderate to large, minimally complex right-sided hydrocele. SKELETON: No abnormal marrow activity. Incidental CT findings: none IMPRESSION: 1. Similar size of a left upper lobe pulmonary nodule, which is hypermetabolic and consistent with primary bronchogenic carcinoma. 2. AP window diminutive but hypermetabolic node, suspicious for ipsilateral mediastinal nodal metastasis. 3. No hypermetabolic extrathoracic metastatic disease. 4. Calcified right cerebral cortical base lesion is suboptimally evaluated. 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 5. Right parietal encephalomalacia, possibly related to remote infarct. 6. Incidental findings, including: Aortic atherosclerosis (ICD10-I70.0), coronary artery atherosclerosis and emphysema (ICD10-J43.9). 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 Tiny hiatal hernia. Esophageal air fluid level suggests dysmotility or  gastroesophageal reflux. 7. Mild ascending thoracic aortic aneurysm, as before. Electronically Signed   By: Abigail Miyamoto M.D.   On: 06/16/2020 15:16   CT BIOPSY  Result Date: 07/01/2020 INDICATION: 79 year old male with a history left lung nodule, FDG avid EXAM: CT BIOPSY MEDICATIONS: None. ANESTHESIA/SEDATION: Moderate (conscious) sedation was employed during this procedure. A total of Versed 1.0 mg and Fentanyl 50 mcg was administered intravenously. Moderate Sedation Time: 15 minutes. The patient's level of consciousness and vital signs were monitored continuously by radiology nursing throughout the procedure under my direct supervision. FLUOROSCOPY TIME:  CT COMPLICATIONS: None PROCEDURE: The procedure, risks, benefits, and alternatives were explained to the patient and the patient's family. Specific risks that were addressed included bleeding, infection, pneumothorax, need for further procedure including chest tube placement, chance of delayed pneumothorax or hemorrhage, hemoptysis, nondiagnostic sample, cardiopulmonary collapse, death. Questions regarding the procedure were encouraged and answered. The patient understands and consents to the procedure. Patient was positioned in the left decubitus position on the CT gantry table and a scout CT of the chest was performed for planning purposes. Once angle of approach was determined, the skin and subcutaneous tissues this scan was prepped and draped in the usual sterile fashion, and a sterile drape was applied covering the operative field. A sterile gown and sterile gloves were used for the procedure. Local anesthesia was provided with 1% Lidocaine. The skin and subcutaneous tissues were infiltrated 1% lidocaine for local anesthesia, and a small stab incision was made with an 11 blade scalpel. Using CT guidance, a 17 gauge trocar needle was advanced into the left upper lobetarget. After confirmation of the tip, separate 18 gauge core biopsies were performed.  These were placed into solution for transportation to the lab. Biosentry Device was deployed. A final CT image was performed. Patient tolerated the procedure well and remained hemodynamically stable throughout. No complications were encountered and no significant blood loss was encounter IMPRESSION: Status post CT-guided biopsy of left upper lobe lung nodule. Signed, Dulcy Fanny. Dellia Nims, RPVI [facility] Electronically Signed   By: Corrie Mckusick D.O.   On: 07/01/2020 16:54   DG Chest Port 1 View  Result Date: 07/01/2020 CLINICAL DATA:  Status post lung biopsy EXAM: PORTABLE CHEST 1 VIEW COMPARISON:  PET-CT June 16, 2020 FINDINGS: No evident pneumothorax. Ill-defined opacity in the left upper lobe is noted, potentially representing hemorrhage following biopsy. The left upper lobe mass seen on recent PET-CT examination is not appreciable on this study. Foci of apparent underlying fibrotic type change better seen on recent CT. No consolidation. Heart size and pulmonary vascularity are normal. There is no appreciable adenopathy. There is aortic atherosclerosis. IMPRESSION: No pneumothorax. Ill-defined opacity left upper lobe may represent post biopsy hemorrhage. Previously noted mass in the left upper lobe is not well seen by radiography. Underlying fibrosis better seen on CT. No consolidation. Heart size within normal limits. No appreciable adenopathy by radiography. Aortic Atherosclerosis (ICD10-I70.0). Electronically Signed   By: Lowella Grip III M.D.   On: 07/01/2020 12:46   ECHOCARDIOGRAM COMPLETE  Result Date: 06/30/2020    ECHOCARDIOGRAM REPORT   Patient Name:   JAKIE DEBOW [facility] Date of Exam: 06/30/2020 Medical Rec #:  [id number]         Height:       74.0 in Accession #:    [id number]        Weight:  225.2 lb Date of Birth:  1941-06-25         BSA:          2.286 m Patient Age:    79 years          BP:           142/82 mmHg Patient  Gender: M                 HR:           84 bpm. Exam Location:  [facility] Procedure: 2D Echo Indications:    Atrial Fibrillation 427.31 / I48.91  History:        Patient has prior history of Echocardiogram examinations, most                 recent 02/10/2015. Arrythmias:Atrial Fibrillation; Risk                 Factors:Former Smoker and Hypertension. Bradycardia, Prostate                 Cancer, Emphysema of Lung.  Sonographer:    Leavy Cella RDCS (AE) Referring Phys: [place]  1. Left ventricular ejection fraction, by estimation, is 55 to 60%. The left ventricle has normal function. The left ventricle has no regional wall motion abnormalities. There is moderate left ventricular hypertrophy. Left ventricular diastolic parameters are indeterminate in the setting of atrial fibrillation.  2. Right ventricular systolic function is normal. The right ventricular size is normal.  3. Left atrial size was mildly dilated.  4. The mitral valve is grossly normal. Trivial mitral valve regurgitation.  5. The aortic valve is tricuspid. Aortic valve regurgitation is not visualized. Mild aortic valve sclerosis is present, with no evidence of aortic valve stenosis.  6. The inferior vena cava is normal in size with greater than 50% respiratory variability, suggesting right atrial pressure of 3 mmHg. FINDINGS  Left Ventricle: Left ventricular ejection fraction, by estimation, is 55 to 60%. The left ventricle has normal function. The left ventricle has no regional wall motion abnormalities. The left ventricular internal cavity size was normal in size. There is  moderate left ventricular hypertrophy. Left ventricular diastolic parameters are indeterminate. Right Ventricle: The right ventricular size is normal. No increase in right ventricular wall thickness. Right ventricular systolic function is normal. Left Atrium: Left atrial size was mildly dilated. Right Atrium: Right atrial size was normal in size.  Pericardium: There is no evidence of pericardial effusion. Mitral Valve: The mitral valve is grossly normal. Trivial mitral valve regurgitation. Tricuspid Valve: The tricuspid valve is grossly normal. Tricuspid valve regurgitation is trivial. Aortic Valve: The aortic valve is tricuspid. Aortic valve regurgitation is not visualized. Mild aortic valve sclerosis is present, with no evidence of aortic valve stenosis. Mild to moderate aortic valve annular calcification. Pulmonic Valve: The pulmonic valve was grossly normal. Pulmonic valve regurgitation is trivial. Aorta: The aortic root is normal in size and structure. Venous: The inferior vena cava is normal in size with greater than 50% respiratory variability, suggesting right atrial pressure of 3 mmHg. IAS/Shunts: No atrial level shunt detected by color flow Doppler.  LEFT VENTRICLE PLAX 2D LVIDd:         4.55 cm  Diastology LVIDs:         3.14 cm  LV e' lateral:   12.10 cm/s LV PW:         1.62 cm  LV E/e' lateral: 5.4 LV IVS:  1.38 cm  LV e' medial:    7.72 cm/s LVOT diam:     2.10 cm  LV E/e' medial:  8.4 LVOT Area:     3.46 cm  RIGHT VENTRICLE RV S prime:     12.70 cm/s TAPSE (M-mode): 2.4 cm LEFT ATRIUM             Index       RIGHT ATRIUM           Index LA diam:        5.40 cm 2.36 cm/m  RA Area:     18.00 cm LA Vol (A2C):   72.5 ml 31.72 ml/m RA Volume:   56.10 ml  24.54 ml/m LA Vol (A4C):   88.4 ml 38.67 ml/m LA Biplane Vol: 80.4 ml 35.17 ml/m   AORTA Ao Root diam: 3.80 cm MITRAL VALVE MV Area (PHT): 2.97 cm    SHUNTS MV Decel Time: 255 msec    Systemic Diam: 2.10 cm MV E velocity: 65.10 cm/s Rozann Lesches MD Electronically signed by Rozann Lesches MD Signature Date/Time: 06/30/2020/12:20:01 PM    Final      ASSESSMENT:  1. Left upper lobe lung mass: -Recent history of pneumonia followed by abnormal chest x-ray findings. -CT of the chest without contrast on 06/03/2020 showed 2.3 x 2.2 x 4.4 cm subpleural mass like consolidation in the  left upper lobe corresponding to the density seen on the prior radiograph. -Patient smoked for 33 years, quit smoking at age 79. He smoked on average of 1 pack/day for most years and at the time of quitting, was smoking 3 packs/day of cigarettes. 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 He reports asbestos exposure throughout his career. 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 -PET scan on 06/16/2020 shows posterior left upper lobe pleural-based hypermetabolic nodule, 2.4 x 2.3 cm SUV 6.2.  AP window lymph node measures 6 mm with SUV 4.3.  No hypermetabolic extrathoracic metastatic disease. -MRI of the brain on 06/22/2020 shows single suspicious focus of enhancement along the inferolateral margin of the temporal lobe on the left measuring 4 mm, not well seen on sagittal or coronal postcontrast imaging.  Unsure if it is small metastasis or a surface vessel.  No second suspicious focus identified.  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  2. Ascending aortic aneurysm: -4.3 cm aneurysmal dilatation of the ascending aorta was noted on the current CT scan. -Annual imaging by CTA or MRAwas recommended.   PLAN:  1. Left upper lobe lung mass: -We reviewed results of the needle core biopsy of the left upper lobe from 07/01/2020.  They reveal lung tissue with a lymphoplasmacytic infiltrate and organizing pneumonia type changes and fibrosis.  Immunohistochemistry was negative for tumor. -He has used Levaquin in the past.  I have recommended 2 more weeks of antibiotic.  I will prescribe Augmentin 875 mg twice daily for 14 days. -I have recommended repeating the PET scan in 6 more weeks.  If it still continues to light up, we will either consider rebiopsy or referral to CT surgery.  2.  COPD: -Continue albuterol as needed.  PFTs show normal lung volumes.   Spirometry was normal.  Moderate diffusion defect.  3.  Sleep apnea: -Continue CPAP.  4.  Atrial fibrillation: -Rate well controlled.  Continue Xarelto.   Orders placed this encounter:  No  orders of the defined types were placed in this encounter.    Derek Jack, MD [place] [phone]   I, Milinda Antis, am acting as a scribe for Dr. Sanda Linger.  I, Derek Jack MD, have reviewed the above documentation for accuracy and completeness, and I agree with the above.

## 2020-07-13 NOTE — Patient Instructions (Signed)
[place] at [facility] Discharge Instructions  You were seen today by Dr. Delton Coombes. He went over your recent results and scans: your biopsy did not show lung cancer. You will be prescribed levofloxacin 500 mg to take daily for 2 weeks. You will be scheduled for a PET scan in mid-September. Dr. Delton Coombes will see you back after the PET scan for follow up.   Thank you for choosing [place] at [facility] to provide your oncology and hematology care.  To afford each patient quality time with our provider, please arrive at least 15 minutes before your scheduled appointment time.   If you have a lab appointment with the [facility] please come in thru the Main Entrance and check in at the main information desk  You need to re-schedule your appointment should you arrive 10 or more minutes late.  We strive to give you quality time with our providers, and arriving late affects you and other patients whose appointments are after yours.  Also, if you no show three or more times for appointments you may be dismissed from the clinic at the providers discretion.     Again, thank you for choosing [facility].  Our hope is that these requests will decrease the amount of time that you wait before being seen by our physicians.       _____________________________________________________________  Should you have questions after your visit to [facility], please contact our office at [phone] between the hours of 8:00 a.m. and 4:30 p.m.  Voicemails left after 4:00 p.m. will not be returned until the following business day.  For prescription refill requests, have your pharmacy contact our office and allow 72 hours.    Cancer Center Support Programs:   > Cancer Support Group  2nd Tuesday of the month 1pm-2pm, Journey Room

## 2020-07-21 ENCOUNTER — Other Ambulatory Visit: Payer: Self-pay

## 2020-07-21 ENCOUNTER — Ambulatory Visit (INDEPENDENT_AMBULATORY_CARE_PROVIDER_SITE_OTHER): Payer: Medicare Other | Admitting: Pulmonary Disease

## 2020-07-21 ENCOUNTER — Encounter: Payer: Self-pay | Admitting: Pulmonary Disease

## 2020-07-21 VITALS — BP 128/70 | HR 83 | Temp 97.0°F | Ht 75.0 in | Wt 225.0 lb

## 2020-07-21 DIAGNOSIS — Z77090 Contact with and (suspected) exposure to asbestos: Secondary | ICD-10-CM | POA: Diagnosis not present

## 2020-07-21 DIAGNOSIS — R942 Abnormal results of pulmonary function studies: Secondary | ICD-10-CM

## 2020-07-21 DIAGNOSIS — R918 Other nonspecific abnormal finding of lung field: Secondary | ICD-10-CM

## 2020-07-21 DIAGNOSIS — J432 Centrilobular emphysema: Secondary | ICD-10-CM | POA: Diagnosis not present

## 2020-07-21 DIAGNOSIS — R911 Solitary pulmonary nodule: Secondary | ICD-10-CM | POA: Diagnosis not present

## 2020-07-21 DIAGNOSIS — G4733 Obstructive sleep apnea (adult) (pediatric): Secondary | ICD-10-CM

## 2020-07-21 DIAGNOSIS — Z9989 Dependence on other enabling machines and devices: Secondary | ICD-10-CM

## 2020-07-21 NOTE — Progress Notes (Signed)
Jonathan Greene, Jonathan Greene, Jonathan Greene  Chief Complaint  Patient presents with   Follow-up    No complaints    Constitutional:  BP 128/70 (BP Location: Left Arm, Cuff Size: Normal)    Pulse 83    Temp (!) 97 F (36.1 C) (Other (Comment)) Comment (Src): Wrist   Ht 6\' 3"  (1.905 m)    Wt 225 lb (102.1 kg)    SpO2 97% Comment: Room Air   BMI 28.12 kg/m   Past Medical History:  A fib, GERD, HLD, HTN, Prostate cancer  Summary:  Jonathan Greene is a 79 y.o. 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  Subjective:   He had PFT that showed moderate diffusion defect.  Core needle biopsy with IR showed inflammation Jonathan fibrosis.  He is on extending course of antibiotics from oncology with plan to repeat PET scan.  Using CPAP nightly.  No issues with mask fit.  Not having sinus congestion or sore throat.  Uses albuterol intermittently.  Not having cough, wheeze, or sputum.  Physical Exam:   Appearance - well kempt   ENMT - no sinus tenderness, no oral exudate, no LAN, Mallampati 2 airway, no stridor, wears dentures  Respiratory - equal breath sounds bilaterally, no wheezing or rales  CV - s1s2 regular rate Jonathan rhythm, no murmurs  Ext - no clubbing, no edema  Skin - no rashes  Psych - normal mood Jonathan affect   Assessment/Plan:   Lt upper lobe mass like consolidation seen on CT chest 06/03/20 that was PET scan positive. - core needle biopsy found to have benign changes - he is on extended course of antibiotics from Dr. 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 - he does have diffusion defect on PFT but no significant obstructive defect - minimal respiratory symptoms - continue prn albuterol  Obstructive sleep apnea. - he is benefit from therapy - continue auto CPAP  History of asbestos exposure. - worked as a  Investment banker, corporate - reviewed outside records Jonathan will have these scanned into Epic  Atrial fibrillation, ascending thoracic aortic aneurysm. - followed by Dr. Harl Bowie with cardiology  A total of  33 minutes spent addressing patient Greene issues on day of visit.  Follow up:  Patient Instructions  Follow up in 8 weeks   Signature:  Chesley Mires, MD [place] Pager: [phone] 07/21/2020, 12:51 PM  Flow Sheet     Greene tests:   PFT 06/30/20 >> FEV1 3.50 (99%), FEV1% 77, TLC 7.09 (90%), DLCO 51%  Lt upper lobe core needle biopsy 07/01/20 >> benign lung with inflammation Jonathan fibrosis  Chest imaging:   CT chest 06/03/20 >> atherosclerosis, aortic root 4.3 cm, severe centrilobular emphysema, LUL subpleural mass like consolidation 4.4 x 2.3 x 2.2 cm   PET scan 06/16/20 >> 2.4 x 2.3 cm pleural based LUL nodule 6.2 SUV, 6 mm AP window node 4.2 SUV, ascending aorta 4.1 cm, centrilobular Jonathan paraseptal emphysema  Sleep testing:   Auto CPAP 05/10/20 to 06/08/20 >> used on 29 of 30 nights with average 5 hrs 59 min.  Average AHI 1.1 with median CPAP 6 Jonathan 95 th percentile CPAP 8 cm H2O.  Air leak.  Cardiac tests:   Echo 06/30/20 >> EF 55 to 60%, mod LVH  Medications:   Allergies as of 07/21/2020   No Known Allergies  Medication List       Accurate as of July 21, 2020 12:51 PM. If you have any questions, ask your nurse or doctor.        albuterol 108 (90 Base) MCG/ACT inhaler Commonly known as: VENTOLIN HFA Inhale 2 puffs into the lungs every 6 (six) hours as needed for wheezing or shortness of breath.   amoxicillin-clavulanate 875-125 MG tablet Commonly known as: AUGMENTIN Take 1 tablet by mouth 2 (two) times daily.   b complex vitamins tablet Take 1 tablet by mouth daily.   escitalopram 10 MG tablet Commonly known as: LEXAPRO Take 10 mg by mouth daily.   Gas-X 80 MG chewable tablet Generic drug: simethicone Chew 80 mg by mouth once as needed  for flatulence. Reported on 06/20/2016   Melatonin 10 MG Caps Take 10 mg by mouth at bedtime.   omeprazole 20 MG capsule Commonly known as: PRILOSEC Take 20 mg by mouth daily.   rivaroxaban 20 MG Tabs tablet Commonly known as: XARELTO Take 1 tablet (20 mg total) by mouth daily with supper.   simvastatin 40 MG tablet Commonly known as: ZOCOR Take 40 mg by mouth at bedtime.   solifenacin 10 MG tablet Commonly known as: VESICARE Take 10 mg by mouth daily.   tamsulosin 0.4 MG Caps capsule Commonly known as: FLOMAX Take 0.4 mg by mouth daily.   triamcinolone cream 0.1 % Commonly known as: KENALOG Apply topically.       Past Surgical History:  He  has a past surgical history that includes Radioactive seed implant (02/28/2012); Cystoscopy (02/28/2012); Cataract extraction w/PHACO (Right, 11/06/2013); Colonoscopy (10/03/2006); Colonoscopy (N/A, 08/19/2014); Jonathan Cataract extraction w/PHACO (Left, 11/25/2015).  Family History:  His family history includes Breast cancer in his sister; COPD in his maternal aunt, sister, Jonathan son; Colon cancer in his sister; Heart attack in his brother; Hypertension in his mother Jonathan sister; Pneumonia in his sister; Stroke in his mother.  Social History:  He  reports that he quit smoking about 29 years ago. His smoking use included cigarettes. He has a 70.00 pack-year smoking history. His smokeless tobacco use includes chew. He reports current alcohol use of about 14.0 standard drinks of alcohol per week. He reports that he does not use drugs.

## 2020-07-21 NOTE — Patient Instructions (Signed)
Follow up in 8 weeks 

## 2020-07-22 ENCOUNTER — Other Ambulatory Visit: Payer: Self-pay

## 2020-07-22 DIAGNOSIS — I4811 Longstanding persistent atrial fibrillation: Secondary | ICD-10-CM

## 2020-07-29 ENCOUNTER — Other Ambulatory Visit: Payer: Self-pay

## 2020-07-29 ENCOUNTER — Ambulatory Visit: Payer: Medicare Other | Admitting: Student

## 2020-07-29 ENCOUNTER — Encounter: Payer: Self-pay | Admitting: Student

## 2020-07-29 VITALS — BP 138/76 | HR 78 | Ht 75.0 in | Wt 223.6 lb

## 2020-07-29 DIAGNOSIS — R5383 Other fatigue: Secondary | ICD-10-CM

## 2020-07-29 DIAGNOSIS — I4819 Other persistent atrial fibrillation: Secondary | ICD-10-CM | POA: Diagnosis not present

## 2020-07-29 DIAGNOSIS — G4733 Obstructive sleep apnea (adult) (pediatric): Secondary | ICD-10-CM | POA: Diagnosis not present

## 2020-07-29 DIAGNOSIS — R0600 Dyspnea, unspecified: Secondary | ICD-10-CM

## 2020-07-29 DIAGNOSIS — R0609 Other forms of dyspnea: Secondary | ICD-10-CM

## 2020-07-29 MED ORDER — METOPROLOL SUCCINATE ER 25 MG PO TB24
25.0000 mg | ORAL_TABLET | Freq: Every day | ORAL | 11 refills | Status: DC
Start: 2020-07-29 — End: 2024-10-09

## 2020-07-29 NOTE — Progress Notes (Signed)
Cardiology Office Note    Date:  07/29/2020   ID:  Jonathan Greene, DOB 12-20-1940, MRN [id number]  PCP:  Celene Squibb, MD  Cardiologist: Carlyle Dolly, MD    Chief Complaint  Patient presents with  . Follow-up    1 month visit    History of Present Illness:    Jonathan Greene is a 79 y.o. male with past medical history of persistent atrial fibrillation, COPD, OSA and HTN who presents to the office today for 1 month follow-up.  He was last examined by Rosaria Ferries, PA-C on 06/24/2020 and reported having progressive weakness and increased dyspnea on exertion.  He was in atrial fibrillation at the time of his visit and given his prior history of significant bradycardia, he was not placed on a beta-blocker. He was continued on Xarelto for anticoagulation and an echocardiogram along with event monitor were recommended for further assessment. Given his progressive weakness and episodes of hypotension, Amlodipine was discontinued at the time of his visit. His echocardiogram showed a preserved EF of 55 to 60% with no regional wall motion abnormalities.  He did have moderate LVH. The LA was mildly dilated. He also had mild aortic valve sclerosis without stenosis. His event monitor showed 3 episodes of NSVT with the longest being 4 beats. He was in atrial fibrillation 100% of the time with heart rate ranging from 43 up to 163 bpm with an average heart rate of 87 bpm.  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 He was referred to Pulmonology and continued on an extended course of antibiotic therapy.  In talking with the patient today, he reports having persistent fatigue for the past 3 to 4 months. He feels like he has little energy to do activities throughout the day and he was previously very active on his farm. He has noticed worsening dyspnea on  exertion within the past few months as well and is being followed by Pulmonology as outlined above. He denies any associated chest pain or palpitations. No recent orthopnea, PND or lower extremity edema.  Past Medical History:  Diagnosis Date  . A-fib ([place])   . Bradycardia   . COPD (chronic obstructive pulmonary disease) ([place])   . Frequent urination   . GERD (gastroesophageal reflux disease)   . Hypercholesterolemia   . Hypertension   . Prostate cancer ([place])    RADIATION [place] IMPLANTS 02-28-12  . Sleep apnea   . Stroke ([place])    2011;ST memory loss, no other deficits.  . Urinary urgency     Past Surgical History:  Procedure Laterality Date  . CATARACT EXTRACTION W/PHACO Right 11/06/2013   Procedure: CATARACT EXTRACTION PHACO AND INTRAOCULAR LENS PLACEMENT ([place]);  Surgeon: Tonny [place], MD;  Location: [facility];  Service: Ophthalmology;  Laterality: Right;  CDE 11.67  . CATARACT EXTRACTION W/PHACO Left 11/25/2015   Procedure: CATARACT EXTRACTION PHACO AND INTRAOCULAR LENS PLACEMENT ; CDE:  8.15;  Surgeon: Tonny [place], MD;  Location: [facility];  Service: Ophthalmology;  Laterality: Left;  . COLONOSCOPY  10/03/2006   [id number] rectal polyps cold biopsied/removed, otherwise normal rectum/Sigmoid diverticula.  Remainder of colon mucosa appeared normal  . COLONOSCOPY N/A 08/19/2014   RMR: Radiation proctitis-status post APC ablation. Colonic diverticulosis.  . CYSTOSCOPY  02/28/2012   Procedure: CYSTOSCOPY;  Surgeon: Bernestine Amass, MD;  Location: [facility];  Service: Urology;  Laterality: N/A;   no seeds noted in bladder  . RADIOACTIVE SEED IMPLANT  02/28/2012   Procedure: RADIOACTIVE SEED IMPLANT;  Surgeon: Bernestine Amass, MD;  Location: [facility];  Service: Urology;  Laterality: N/A;  67seeds implanted     Current Medications: Outpatient Medications Prior to Visit  Medication Sig Dispense Refill  . albuterol (VENTOLIN HFA) 108 (90 Base)  MCG/ACT inhaler Inhale 2 puffs into the lungs every 6 (six) hours as needed for wheezing or shortness of breath. 6.7 g 5  . b complex vitamins tablet Take 1 tablet by mouth daily.    Marland Kitchen escitalopram (LEXAPRO) 10 MG tablet Take 10 mg by mouth daily.    . Melatonin 10 MG CAPS Take 10 mg by mouth at bedtime.     Marland Kitchen omeprazole (PRILOSEC) 20 MG capsule Take 20 mg by mouth daily.     . rivaroxaban (XARELTO) 20 MG TABS tablet Take 1 tablet (20 mg total) by mouth daily with supper. 90 tablet 4  . simethicone (GAS-X) 80 MG chewable tablet Chew 80 mg by mouth once as needed for flatulence. Reported on 06/20/2016    . simvastatin (ZOCOR) 40 MG tablet Take 40 mg by mouth at bedtime.     . solifenacin (VESICARE) 10 MG tablet Take 10 mg by mouth daily.     . tamsulosin (FLOMAX) 0.4 MG CAPS capsule Take 0.4 mg by mouth daily.    Marland Kitchen triamcinolone cream (KENALOG) 0.1 % Apply topically.    Marland Kitchen amoxicillin-clavulanate (AUGMENTIN) 875-125 MG tablet Take 1 tablet by mouth 2 (two) times daily. (Patient not taking: Reported on 07/29/2020) 28 tablet 0   No facility-administered medications prior to visit.     Allergies:   Patient has no known allergies.   Social History   Socioeconomic History  . Marital status: Married    Spouse name: Jonathan Greene  . Number of children: 2  . Years of education: Not on file  . Highest education level: Not on file  Occupational History  . Occupation: retired  Tobacco Use  . Smoking status: Former Smoker    Packs/day: 2.00    Years: 35.00    Pack years: 70.00    Types: Cigarettes    Quit date: 02/21/1991    Years since quitting: 29.4  . Smokeless tobacco: Current User    Types: Chew    Last attempt to quit: 03/11/2016  . Tobacco comment: CHEW TOBACCO FOR 20 YRS   Substance and Sexual Activity  . Alcohol use: Yes    Alcohol/week: 14.0 standard drinks    Types: 14 Shots of liquor per week    Comment: 4 ounces daily  . Drug use: No  . Sexual activity: Yes    Birth  control/protection: None  Other Topics Concern  . Not on file  Social History Narrative  . Not on file   Social Determinants of Health   Financial Resource Strain: Low Risk   . Difficulty of Paying Living Expenses: Not hard at all  Food Insecurity: No Food Insecurity  . Worried About Charity fundraiser in the Last Year: Never true  . Ran Out of Food in the Last Year: Never true  Transportation Needs: No Transportation Needs  . Lack of Transportation (Medical): No  . Lack of Transportation (Non-Medical): No  Physical Activity:   . Days of Exercise per Week: Not on file  . Minutes of Exercise per Session: Not on file  Stress:  No Stress Concern Present  . Feeling of Stress : Not at all  Social Connections: Moderately Isolated  . Frequency of Communication with Friends and Family: More than three times a week  . Frequency of Social Gatherings with Friends and Family: More than three times a week  . Attends Religious Services: Never  . Active Member of Clubs or Organizations: No  . Attends Archivist Meetings: Never  . Marital Status: Married     Family History:  The patient's family history includes Breast cancer in his sister; COPD in his maternal aunt, sister, and son; Colon cancer in his sister; Heart attack in his brother; Hypertension in his mother and sister; Pneumonia in his sister; Stroke in his mother.   Review of Systems:   Please see the history of present illness.     General:  No chills, fever, night sweats or weight changes. Positive for fatigue.  Cardiovascular:  No chest pain, edema, orthopnea, palpitations, paroxysmal nocturnal dyspnea. Positive for dyspnea on exertion.  Dermatological: No rash, lesions/masses Respiratory: No cough.  Urologic: No hematuria, dysuria Abdominal:   No nausea, vomiting, diarrhea, bright red blood per rectum, melena, or hematemesis Neurologic:  No visual changes, wkns, changes in mental status. All other systems reviewed  and are otherwise negative except as noted above.   Physical Exam:    VS:  BP 138/76   Pulse 78   Ht 6\' 3"  (1.905 m)   Wt 223 lb 9.6 oz (101.4 kg)   SpO2 96%   BMI 27.95 kg/m    General: Well developed, well nourished,male appearing in no acute distress. Head: Normocephalic, atraumatic. Neck: No carotid bruits. JVD not elevated.  Lungs: Respirations regular and unlabored, without wheezes or rales.  Heart: Irregularly irregular. No S3 or S4.  No murmur, no rubs, or gallops appreciated. Abdomen: Appears non-distended. No obvious abdominal masses. Msk:  Strength and tone appear normal for age. No obvious joint deformities or effusions. Extremities: No clubbing or cyanosis. No lower extremity edema.  Distal pedal pulses are 2+ bilaterally. Neuro: Alert and oriented X 3. Moves all extremities spontaneously. No focal deficits noted. Psych:  Responds to questions appropriately with a normal affect. Skin: No rashes or lesions noted  Wt Readings from Last 3 Encounters:  07/29/20 223 lb 9.6 oz (101.4 kg)  07/21/20 225 lb (102.1 kg)  07/13/20 226 lb 9.6 oz (102.8 kg)     Studies/Labs Reviewed:   EKG:  EKG is not ordered today.    Recent Labs: 07/01/2020: Hemoglobin 16.2; Platelets 157   Lipid Panel No results found for: CHOL, TRIG, HDL, CHOLHDL, VLDL, LDLCALC, LDLDIRECT  Additional studies/ records that were reviewed today include:   Echocardiogram: 06/2020 IMPRESSIONS    1. Left ventricular ejection fraction, by estimation, is 55 to 60%. The  left ventricle has normal function. The left ventricle has no regional  wall motion abnormalities. There is moderate left ventricular hypertrophy.  Left ventricular diastolic  parameters are indeterminate in the setting of atrial fibrillation.  2. Right ventricular systolic function is normal. The right ventricular  size is normal.  3. Left atrial size was mildly dilated.  4. The mitral valve is grossly normal. Trivial mitral valve   regurgitation.  5. The aortic valve is tricuspid. Aortic valve regurgitation is not  visualized. Mild aortic valve sclerosis is present, with no evidence of  aortic valve stenosis.  6. The inferior vena cava is normal in size with greater than 50%  respiratory variability, suggesting right  atrial pressure of 3 mmHg.   Event Monitor: 07/2020    Assessment:    1. Dyspnea on exertion   2. Fatigue, unspecified type   3. Persistent atrial fibrillation ([place])   4. OSA (obstructive sleep apnea)      Plan:   In order of problems listed above:  1. Dyspnea on Exertion/Fatigue - Unclear etiology of his symptoms at this time. He reports routine labs have been checked by his PCP and were unrevealing. Recent echo showed a preserved EF with no regional WMA as outlined above. His event monitor showed 100% AF burden and he did have episodes of atrial fibrillation with RVR. He has been in atrial fibrillation since last year but I am concerned he might be symptomatic with his higher HR when active. Was previously bradycardiac when in sinus rhythm but given his AF burden, will try low-dose Toprol-XL 12.5mg  daily to see if this helps with symptoms. Would titrate to 25mg  daily in 1 week. If no improvement in several weeks, could discontinue. This was picked over Cardizem given the drug interaction with Simvastatin and due to brief NSVT.  - If his dyspnea and fatigue persist, would plan to obtain a Lexiscan Myoview to rule out an ischemic etiology and this was reviewed with the patient today. He also has scheduled follow-up with Pulmonology and a repeat PET scan scheduled for 08/16/2020.  2. Persistent Atrial Fibrillation - Recent monitor showed 100% AF burden with average HR of 87 as outlined above. Will start a trial of BB therapy to see if this helps with his symptoms.  - He denies any evidence of active bleeding. Remains on Xarelto 20mg  daily for anticoagulation. Hgb was stable at 16.2 in 06/2020 with  platelets at 157.  3. OSA - Continued compliance with CPAP encouraged.    Medication Adjustments/Labs and Tests Ordered: Current medicines are reviewed at length with the patient today.  Concerns regarding medicines are outlined above.  Medication changes, Labs and Tests ordered today are listed in the Patient Instructions below. Patient Instructions  Medication Instructions:   Start Toprol-XL 12.5mg  daily for 1 week. After 1 week, increase to a full tablet (25mg  daily).   Call after two weeks to let us know how you are doing. If no improvement in symptoms, we will arrange for a stress test.   Labwork:  None  Testing/Procedures:  None  Follow-Up:  3 months with Dr. Harl Bowie  Any Other Special Instructions Will Be Listed Below (If Applicable).     If you need a refill on your cardiac medications before your next appointment, please call your pharmacy.    Signed, Erma Heritage, PA-C  07/29/2020 8:06 PM    [place] [address] [place], [place] [address] Phone: [phone] Fax: [phone]

## 2020-07-29 NOTE — Patient Instructions (Addendum)
Medication Instructions:   Start Toprol-XL 12.5mg  daily for 1 week. After 1 week, increase to a full tablet (25mg  daily).   Call after two weeks to let us know how you are doing. If no improvement in symptoms, we will arrange for a stress test.   Labwork:  None  Testing/Procedures:  None  Follow-Up:  3 months with Dr. Harl Bowie  Any Other Special Instructions Will Be Listed Below (If Applicable).     If you need a refill on your cardiac medications before your next appointment, please call your pharmacy.

## 2020-07-30 ENCOUNTER — Encounter (HOSPITAL_COMMUNITY): Payer: Self-pay

## 2020-07-30 NOTE — Progress Notes (Signed)
Patient called today to provide an update following recent antibiotic regimen. Patient reports that symptoms have improved but reports that he still has decreased energy levels and occasional small amounts of pink-tinged sputum. Randi, NP aware. Per Randi, no new medications to be prescribed at this time. I informed the patient of this. I encouraged the patient to call immediately to report worsening symptoms.

## 2020-08-16 ENCOUNTER — Ambulatory Visit (HOSPITAL_COMMUNITY)
Admission: RE | Admit: 2020-08-16 | Discharge: 2020-08-16 | Disposition: A | Discharge status: Home / Self Care | Payer: Medicare Other | Source: Ambulatory Visit | Attending: Hematology | Admitting: Hematology

## 2020-08-16 ENCOUNTER — Other Ambulatory Visit: Payer: Self-pay

## 2020-08-16 DIAGNOSIS — R918 Other nonspecific abnormal finding of lung field: Secondary | ICD-10-CM | POA: Diagnosis present

## 2020-08-16 MED ORDER — FLUDEOXYGLUCOSE F - 18 (FDG) INJECTION
12.5000 | Freq: Once | INTRAVENOUS | Status: AC | PRN
  Administered 2020-08-16: 12.5 via INTRAVENOUS

## 2020-08-18 ENCOUNTER — Other Ambulatory Visit: Payer: Self-pay

## 2020-08-18 ENCOUNTER — Inpatient Hospital Stay (HOSPITAL_COMMUNITY): Payer: Medicare Other | Attending: Hematology | Admitting: Hematology

## 2020-08-18 VITALS — BP 123/87 | HR 57 | Temp 96.9°F | Resp 18 | Wt 228.4 lb

## 2020-08-18 DIAGNOSIS — R918 Other nonspecific abnormal finding of lung field: Secondary | ICD-10-CM

## 2020-08-18 DIAGNOSIS — R093 Abnormal sputum: Secondary | ICD-10-CM | POA: Diagnosis not present

## 2020-08-18 DIAGNOSIS — J449 Chronic obstructive pulmonary disease, unspecified: Secondary | ICD-10-CM | POA: Diagnosis not present

## 2020-08-18 DIAGNOSIS — I4891 Unspecified atrial fibrillation: Secondary | ICD-10-CM | POA: Insufficient documentation

## 2020-08-18 DIAGNOSIS — Z8701 Personal history of pneumonia (recurrent): Secondary | ICD-10-CM | POA: Diagnosis not present

## 2020-08-18 DIAGNOSIS — I719 Aortic aneurysm of unspecified site, without rupture: Secondary | ICD-10-CM | POA: Insufficient documentation

## 2020-08-18 DIAGNOSIS — Z87891 Personal history of nicotine dependence: Secondary | ICD-10-CM | POA: Diagnosis not present

## 2020-08-18 DIAGNOSIS — G4730 Sleep apnea, unspecified: Secondary | ICD-10-CM | POA: Insufficient documentation

## 2020-08-18 DIAGNOSIS — Z7901 Long term (current) use of anticoagulants: Secondary | ICD-10-CM | POA: Diagnosis not present

## 2020-08-18 NOTE — Patient Instructions (Signed)
[place] at [facility] Discharge Instructions  You were seen today by Dr. Delton Coombes. He went over your recent results and scans. You will be scheduled for a CT scan of your chest before your next visit. Dr. Delton Coombes will see you back in 3 months for labs and follow up.   Thank you for choosing [place] at [facility] to provide your oncology and hematology care.  To afford each patient quality time with our provider, please arrive at least 15 minutes before your scheduled appointment time.   If you have a lab appointment with the [facility] please come in thru the Main Entrance and check in at the main information desk  You need to re-schedule your appointment should you arrive 10 or more minutes late.  We strive to give you quality time with our providers, and arriving late affects you and other patients whose appointments are after yours.  Also, if you no show three or more times for appointments you may be dismissed from the clinic at the providers discretion.     Again, thank you for choosing [facility].  Our hope is that these requests will decrease the amount of time that you wait before being seen by our physicians.       _____________________________________________________________  Should you have questions after your visit to [facility], please contact our office at [phone] between the hours of 8:00 a.m. and 4:30 p.m.  Voicemails left after 4:00 p.m. will not be returned until the following business day.  For prescription refill requests, have your pharmacy contact our office and allow 72 hours.    Cancer Center Support Programs:   > Cancer Support Group  2nd Tuesday of the month 1pm-2pm, Journey Room

## 2020-08-18 NOTE — Progress Notes (Signed)
[place], [place] [address]   CLINIC:  Medical Oncology/Hematology  PCP:  Celene Squibb, MD [address] [name] [place] [address] [phone]   REASON FOR VISIT:  Follow-up for left upper lobe lung mass  PRIOR THERAPY: None  NGS Results: Not done  CURRENT THERAPY: Under work-up  BRIEF ONCOLOGIC HISTORY:  Oncology History   No history exists.    CANCER STAGING: Cancer Staging Prostate cancer [facility]) Staging form: Prostate, AJCC 7th Edition - Clinical: Stage IIA (T2a, N0, M0) - Signed by Rexene Edison, MD on 12/07/2011 - Pathologic: No stage assigned - Unsigned   INTERVAL HISTORY:  Jonathan Greene, a 79 y.o. male, returns for routine follow-up of his left upper lobe lung mass. Jonathan Greene was last seen on 07/13/2020.  Today he reports feeling better and his cough is improving. He finished taking the antibiotics though he continues coughing up greenish sputum, which is chronic, but without blood; Dr. Halford Chessman is his pulmonologist. He denies having CP. His appetite is great.  He used to work as a Estate manager/land agent and he has exposure to arsenic and asbestos.   REVIEW OF SYSTEMS:  Review of Systems  Constitutional: Positive for fatigue (mild). Negative for appetite change.  Respiratory: Positive for cough (productive w/ greenish sputum; improving) and shortness of breath. Negative for hemoptysis.   Cardiovascular: Negative for chest pain.  Genitourinary: Positive for difficulty urinating (d/t hx of prostate cancer).   Neurological: Positive for dizziness.  All other systems reviewed and are negative.   PAST MEDICAL/SURGICAL HISTORY:  Past Medical History:  Diagnosis Date  . A-fib ([place])   . Bradycardia   . COPD (chronic obstructive pulmonary disease) ([place])   . Frequent urination   . GERD (gastroesophageal reflux disease)   . Hypercholesterolemia   . Hypertension   . Prostate cancer ([place])    RADIATION [place] IMPLANTS 02-28-12  . Sleep apnea   . Stroke ([place])    2011;ST memory loss, no other deficits.  . Urinary urgency    Past Surgical History:  Procedure Laterality Date  . CATARACT EXTRACTION W/PHACO Right 11/06/2013   Procedure: CATARACT EXTRACTION PHACO AND INTRAOCULAR LENS PLACEMENT ([place]);  Surgeon: Tonny Branch, MD;  Location: [facility];  Service: Ophthalmology;  Laterality: Right;  CDE 11.67  . CATARACT EXTRACTION W/PHACO Left 11/25/2015   Procedure: CATARACT EXTRACTION PHACO AND INTRAOCULAR LENS PLACEMENT ; CDE:  8.15;  Surgeon: Tonny Branch, MD;  Location: [facility];  Service: Ophthalmology;  Laterality: Left;  . COLONOSCOPY  10/03/2006   TIR:[id number] rectal polyps cold biopsied/removed, otherwise normal rectum/Sigmoid diverticula.  Remainder of colon mucosa appeared normal  . COLONOSCOPY N/A 08/19/2014   RMR: Radiation proctitis-status post APC ablation. Colonic diverticulosis.  . CYSTOSCOPY  02/28/2012   Procedure: CYSTOSCOPY;  Surgeon: Bernestine Amass, MD;  Location: [facility];  Service: Urology;  Laterality: N/A;   no seeds noted in bladder  . RADIOACTIVE SEED IMPLANT  02/28/2012   Procedure: RADIOACTIVE SEED IMPLANT;  Surgeon: Bernestine Amass, MD;  Location: [facility];  Service: Urology;  Laterality: N/A;  67seeds implanted     SOCIAL HISTORY:  Social History   Socioeconomic History  . Marital status: Married    Spouse name: Vaughan Basta  . Number of children: 2  . Years of education: Not on file  . Highest education level: Not on file  Occupational History  . Occupation: retired  Tobacco Use  . Smoking status: Former Smoker    Packs/day: 2.00    Years: 35.00    Pack years: 70.00    Types: Cigarettes    Quit date: 02/21/1991    Years since quitting: 29.5  . Smokeless tobacco: Current User    Types: Chew    Last attempt to quit: 03/11/2016  . Tobacco comment: CHEW TOBACCO FOR 20 YRS   Substance and Sexual Activity  . Alcohol use: Yes     Alcohol/week: 14.0 standard drinks    Types: 14 Shots of liquor per week    Comment: 4 ounces daily  . Drug use: No  . Sexual activity: Yes    Birth control/protection: None  Other Topics Concern  . Not on file  Social History Narrative  . Not on file   Social Determinants of Health   Financial Resource Strain: Low Risk   . Difficulty of Paying Living Expenses: Not hard at all  Food Insecurity: No Food Insecurity  . Worried About Charity fundraiser in the Last Year: Never true  . Ran Out of Food in the Last Year: Never true  Transportation Needs: No Transportation Needs  . Lack of Transportation (Medical): No  . Lack of Transportation (Non-Medical): No  Physical Activity:   . Days of Exercise per Week: Not on file  . Minutes of Exercise per Session: Not on file  Stress: No Stress Concern Present  . Feeling of Stress : Not at all  Social Connections: Moderately Isolated  . Frequency of Communication with Friends and Family: More than three times a week  . Frequency of Social Gatherings with Friends and Family: More than three times a week  . Attends Religious Services: Never  . Active Member of Clubs or Organizations: No  . Attends Archivist Meetings: Never  . Marital Status: Married  Human resources officer Violence: Not At Risk  . Fear of Current or Ex-Partner: No  . Emotionally Abused: No  . Physically Abused: No  . Sexually Abused: No    FAMILY HISTORY:  Family History  Problem Relation Age of Onset  . Stroke Mother   . Hypertension Mother   . Colon cancer Sister        in her 34s  . Breast cancer Sister   . Heart attack Brother   . COPD Maternal Aunt   . COPD Sister   . Hypertension Sister   . Pneumonia Sister   . COPD Son   . Cancer Neg Hx     CURRENT MEDICATIONS:  Current Outpatient Medications  Medication Sig Dispense Refill  . albuterol (VENTOLIN HFA) 108 (90 Base) MCG/ACT inhaler Inhale 2 puffs into the lungs every 6 (six) hours as needed for  wheezing or shortness of breath. 6.7 g 5  . b complex vitamins tablet Take 1 tablet by mouth daily.    Marland Kitchen escitalopram (LEXAPRO) 10 MG tablet Take 10 mg by mouth daily.    . Melatonin 10 MG CAPS Take 10 mg by mouth at bedtime.     . metoprolol succinate (TOPROL XL) 25 MG 24 hr tablet Take 1 tablet (25 mg total) by mouth daily. 30 tablet 11  . omeprazole (PRILOSEC) 20 MG capsule Take 20 mg by mouth daily.     . rivaroxaban (XARELTO) 20 MG TABS tablet Take 1 tablet (20 mg total) by mouth daily with supper. 90 tablet 4  . simethicone (GAS-X) 80 MG chewable tablet Chew 80 mg by mouth once as  needed for flatulence. Reported on 06/20/2016    . simvastatin (ZOCOR) 40 MG tablet Take 40 mg by mouth at bedtime.     . solifenacin (VESICARE) 10 MG tablet Take 10 mg by mouth daily.     . tamsulosin (FLOMAX) 0.4 MG CAPS capsule Take 0.4 mg by mouth daily.    Marland Kitchen triamcinolone cream (KENALOG) 0.1 % Apply topically.     No current facility-administered medications for this visit.    ALLERGIES:  No Known Allergies  PHYSICAL EXAM:  Performance status (ECOG): 1 - Symptomatic but completely ambulatory  Vitals:   08/18/20 1044  BP: 123/87  Pulse: (!) 57  Resp: 18  Temp: (!) 96.9 F (36.1 C)  SpO2: 98%   Wt Readings from Last 3 Encounters:  08/18/20 228 lb 6.4 oz (103.6 kg)  07/29/20 223 lb 9.6 oz (101.4 kg)  07/21/20 225 lb (102.1 kg)   Physical Exam Vitals reviewed.  Constitutional:      Appearance: Normal appearance.  Cardiovascular:     Rate and Rhythm: Normal rate and regular rhythm.     Pulses: Normal pulses.     Heart sounds: Normal heart sounds.  Pulmonary:     Effort: Pulmonary effort is normal.     Breath sounds: Normal breath sounds.  Neurological:     General: No focal deficit present.     Mental Status: He is alert and oriented to person, place, and time.  Psychiatric:        Mood and Affect: Mood normal.        Behavior: Behavior normal.      LABORATORY DATA:  I have  reviewed the labs as listed.  CBC Latest Ref Rng & Units 07/01/2020 12/31/2019 01/20/2017  WBC 4.0 - 10.5 K/uL 6.9 6.8 11.6(H)  Hemoglobin 13.0 - 17.0 g/dL 16.2 15.4 15.2  Hematocrit 39 - 52 % 50.0 45.6 45.7  Platelets 150 - 400 K/uL 157 184 165   CMP Latest Ref Rng & Units 01/20/2017 02/29/2016 06/09/2015  Glucose 65 - 99 mg/dL 154(H) 111(H) -  BUN 6 - 20 mg/dL 18 32(H) -  Creatinine 0.61 - 1.24 mg/dL 1.25(H) 1.63(H) 1.30(H)  Sodium 135 - 145 mmol/L 139 135 -  Potassium 3.5 - 5.1 mmol/L 4.4 3.5 -  Chloride 101 - 111 mmol/L 103 104 -  CO2 22 - 32 mmol/L 28 21(L) -  Calcium 8.9 - 10.3 mg/dL 9.6 8.5(L) -  Total Protein 6.5 - 8.1 g/dL 7.5 8.1 -  Total Bilirubin 0.3 - 1.2 mg/dL 0.9 0.9 -  Alkaline Phos 38 - 126 U/L 69 73 -  AST 15 - 41 U/L 55(H) 63(H) -  ALT 17 - 63 U/L 42 64(H) -    DIAGNOSTIC IMAGING:  I have independently reviewed the scans and discussed with the patient. Cardiac event monitor  Result Date: 08/11/2020  14 day event monitor  Max HR 190, Min HR 43, Avg HR 87  Atrial firbillation throughout study  Rare ventricular ectopy in the form of isolated PVCs, couplets, triplets.  Reported symptoms correlated with afib with elevated heart rates.    NM PET Image Restag (PS) Skull Base To Thigh  Result Date: 08/17/2020 CLINICAL DATA:  Subsequent treatment strategy for left upper lobe pulmonary nodule. Biopsy negative on 07/01/2020. Interval antibiotic therapy. History of prostate cancer treated with radiation therapy 4 years prior. EXAM: NUCLEAR MEDICINE PET SKULL BASE TO THIGH TECHNIQUE: 12.5 mCi F-18 FDG was injected intravenously. Full-ring PET imaging was performed from the skull  base to thigh after the radiotracer. CT data was obtained and used for attenuation correction and anatomic localization. Fasting blood glucose: 108 mg/dl COMPARISON:  06/16/2020 PET-CT. FINDINGS: Mediastinal blood pool activity: SUV max 3.2 Liver activity: SUV max NA NECK: No hypermetabolic lymph nodes in  the neck. Incidental CT findings: none CHEST: Mildly hypermetabolic irregular somewhat bandlike subpleural 2.2 x 1.2 cm pulmonary nodule in the peripheral left upper lobe with max SUV 3.4 (series 3/image 102), previously 2.8 x 2.2 cm using similar measurement technique with max SUV 6.2, decreased in size and metabolism. No new hypermetabolic pulmonary findings. No enlarged or hypermetabolic axillary, mediastinal or hilar lymph nodes. Previously described hypermetabolic AP window node has resolved. Incidental CT findings: Severe centrilobular and paraseptal emphysema. Mild cardiomegaly. Coronary atherosclerosis. 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 Hyperdense non hypermetabolic superficial subcutaneous 2.1 cm lesion in the ventral left chest wall is unchanged and compatible with a benign lesion such as a complex sebaceous cyst. ABDOMEN/PELVIS: No abnormal hypermetabolic activity within the liver, pancreas, adrenal glands, or spleen. No hypermetabolic lymph nodes in the abdomen or pelvis. Incidental CT findings: Small hiatal hernia. Atherosclerotic nonaneurysmal abdominal aorta. Mild sigmoid diverticulosis. Atrophic prostate with numerous brachytherapy seeds. Moderate right hydrocele appears unchanged. SKELETON: No focal hypermetabolic activity to suggest skeletal metastasis. Incidental CT findings: none IMPRESSION: 1. Somewhat bandlike 2.2 x 1.2 cm subpleural pulmonary nodule in the peripheral left upper lobe with mild hypermetabolism, decreased in size and metabolism since 06/16/2020 PET-CT study, most suggestive of resolving infection with evolving nodular scarring. Continued close chest CT surveillance recommended in 3-6 months. 2. No evidence of hypermetabolic metastatic disease. Previously described hypermetabolic AP window lymphadenopathy has resolved. 3. 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 Ascending thoracic aortic aneurysm. Recommend semi-annual imaging  followup by CTA or MRA and referral to cardiothoracic surgery if not already obtained. This recommendation follows 2010 ACCF/AHA/AATS/ACR/ASA/SCA/SCAI/SIR/STS/SVM Guidelines for the Diagnosis and Management of Patients With Thoracic Aortic Disease. Circulation. 2010; 121: X528-U132. Aortic aneurysm NOS (ICD10-I71.9). 4. Chronic findings include: Aortic Atherosclerosis (ICD10-I70.0) and Emphysema (ICD10-J43.9). Mild cardiomegaly. Small hiatal hernia. Mild sigmoid diverticulosis. Moderate right hydrocele. Electronically Signed   By: Ilona Sorrel M.D.   On: 08/17/2020 10:59     ASSESSMENT:  1. Left upper lobe lung mass: -Recent history of pneumonia followed by abnormal chest x-ray findings. -CT of the chest without contrast on 06/03/2020 showed 2.3 x 2.2 x 4.4 cm subpleural mass like consolidation in the left upper lobe corresponding to the density seen on the prior radiograph. -Patient smoked for 33 years, quit smoking at age 20. He smoked on average of 1 pack/day for most years and at the time of quitting, was smoking 3 packs/day of cigarettes. 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 He reports asbestos exposure throughout his career. 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 -PET scan on 06/16/2020 shows posterior left upper lobe pleural-based hypermetabolic nodule, 2.4 x 2.3 cm SUV 6.2. AP window lymph node measures 6 mm with SUV 4.3. No hypermetabolic extrathoracic metastatic disease. -MRI of the brain on 06/22/2020 shows single suspicious focus of enhancement along the inferolateral margin of the temporal lobe on the left measuring 4 mm, not well seen on sagittal or coronal postcontrast imaging. Unsure if it is small metastasis or a surface vessel. No second suspicious focus identified. 12 mm lesion in the right sylvian fissure consistent with thrombosed MCA  aneurysm. -Left upper lobe needle  biopsy on 07/01/2020 shows lung tissue with a lymphoplasmacytic infiltrate and organizing pneumonia type changes and fibrosis.  IHC negative for tumor. -PET scan on 08/16/2020 shows subpleural left upper lobe lung nodule measuring 2.2 x 1.2 cm, previously 2.8 x 2.2 cm.  SUV 3.4, previously 6.2.  Previously described hypermetabolic AP window node has resolved.  2. Ascending aortic aneurysm: -4.3 cm aneurysmal dilatation of the ascending aorta was noted on the current CT scan. -Annual imaging by CTA or MRAwas recommended.   PLAN:  1. Left upper lobe lung mass: -He is continuing to have some cough with a greenish sputum which is stable for years. -Reviewed PET scan from 08/16/2020.  The size of the left upper lobe nodule as well as SUV has improved.  Lymphadenopathy is no longer seen. -I would not prescribe any antibiotic at this time.  We will plan to repeat CT scan of the chest in 3 months.  2.COPD: -Continue albuterol as needed.  3. Sleep apnea: -Continue CPAP.  4. Atrial fibrillation: -Continue Xarelto.  Rate well controlled.   Orders placed this encounter:  No orders of the defined types were placed in this encounter.    Derek Jack, MD [place] [phone]   I, Milinda Antis, am acting as a scribe for Dr. Sanda Linger.  I, Derek Jack MD, have reviewed the above documentation for accuracy and completeness, and I agree with the above.

## 2020-09-15 ENCOUNTER — Encounter: Payer: Self-pay | Admitting: Pulmonary Disease

## 2020-09-15 ENCOUNTER — Ambulatory Visit: Payer: Medicare Other | Admitting: Pulmonary Disease

## 2020-09-15 ENCOUNTER — Other Ambulatory Visit: Payer: Self-pay

## 2020-09-15 VITALS — BP 144/80 | HR 86 | Temp 97.6°F | Ht 75.0 in | Wt 227.8 lb

## 2020-09-15 DIAGNOSIS — Z23 Encounter for immunization: Secondary | ICD-10-CM | POA: Diagnosis not present

## 2020-09-15 DIAGNOSIS — G4733 Obstructive sleep apnea (adult) (pediatric): Secondary | ICD-10-CM

## 2020-09-15 DIAGNOSIS — R911 Solitary pulmonary nodule: Secondary | ICD-10-CM | POA: Diagnosis not present

## 2020-09-15 DIAGNOSIS — Z9989 Dependence on other enabling machines and devices: Secondary | ICD-10-CM

## 2020-09-15 DIAGNOSIS — R942 Abnormal results of pulmonary function studies: Secondary | ICD-10-CM

## 2020-09-15 DIAGNOSIS — Z77090 Contact with and (suspected) exposure to asbestos: Secondary | ICD-10-CM | POA: Diagnosis not present

## 2020-09-15 DIAGNOSIS — J432 Centrilobular emphysema: Secondary | ICD-10-CM | POA: Diagnosis not present

## 2020-09-15 DIAGNOSIS — R918 Other nonspecific abnormal finding of lung field: Secondary | ICD-10-CM

## 2020-09-15 NOTE — Progress Notes (Signed)
[facility]  Chief Complaint  Patient presents with  . Follow-up    Cpap follow up    Constitutional:  BP (!) 144/80 (BP Location: Left Arm, Cuff Size: Normal)   Pulse 86   Temp 97.6 F (36.4 C) (Other (Comment)) Comment (Src): wrist  Ht 6\' 3"  (1.905 m)   Wt 227 lb 12.8 oz (103.3 kg)   SpO2 96% Comment: Room air  BMI 28.47 kg/m   Past Medical History:  A fib, GERD, HLD, HTN, Prostate cancer  Past Surgical History:  His  has a past surgical history that includes Radioactive seed implant (02/28/2012); Cystoscopy (02/28/2012); Cataract extraction w/PHACO (Right, 11/06/2013); Colonoscopy (10/03/2006); Colonoscopy (N/A, 08/19/2014); and Cataract extraction w/PHACO (Left, 11/25/2015).  Brief Summary:  Jonathan Greene is a 79 y.o. 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      Subjective:   He had f/u PET scan in September.  This showed decreased size and uptake of LUL mass and improvement in adenopathy.  He has tried using albuterol intermittently, but doesn't seem to help much.    Has occasional cough with sputum.  Doesn't feel that his breathing limits his activity.  Uses CPAP nightly.  Has to adjust his mask occasionally to prevent mask leak.  Physical Exam:   Appearance - well kempt   ENMT - no sinus tenderness, no oral exudate, no LAN, Mallampati 3 airway, no stridor  Respiratory - equal breath sounds bilaterally, no wheezing or rales  CV - s1s2 regular rate and rhythm, no murmurs  Ext - no clubbing, no edema  Skin - no rashes  Psych - normal mood and affect   Pulmonary testing:   PFT 06/30/20 >> FEV1 3.50 (99%), FEV1% 77, TLC 7.09 (90%), DLCO 51%  Lt upper lobe core needle biopsy 07/01/20 >> benign lung with inflammation and fibrosis  Chest Imaging:   CT chest 06/03/20 >> atherosclerosis, aortic root 4.3 cm, severe  centrilobular emphysema, LUL subpleural mass like consolidation 4.4 x 2.3 x 2.2 cm   PET scan 06/16/20 >> 2.4 x 2.3 cm pleural based LUL nodule 6.2 SUV, 6 mm AP window node 4.2 SUV, ascending aorta 4.1 cm, centrilobular and paraseptal emphysema  PET scan 08/16/20 >> decreased size and SUV of LUL 2.2 x 1.2 cm mass, AP window LAN resolved  Sleep Tests:   Auto CPAP 08/15/20 to 09/13/20 >> used on 30 of 30 nights with average 7 hrs 46 min.  Average AHI 4.2 with median CPAP 6 and 95 th percentile CPAP 9 cm H2O  Cardiac Tests:   Echo 06/30/20 >> EF 55 to 60%, mod LVH  Social History:  He  reports that he quit smoking about 29 years ago. His smoking use included cigarettes. He has a 70.00 pack-year smoking history. His smokeless tobacco use includes chew. He reports current alcohol use of about 14.0 standard drinks of alcohol per week. He reports that he does not use drugs.  Family History:  His family history includes Breast cancer in his sister; COPD in his maternal aunt, sister, and son; Colon cancer in his sister; Heart attack in his brother; Hypertension in his mother and sister; Pneumonia in his sister; Stroke in his mother.     Assessment/Plan:   Lt upper lobe mass like consolidation seen on CT chest 06/03/20 that was PET scan positive but had improvement on PET scan from 08/16/20. -  he has f/u CT chest scheduled through Dr. Delton Coombes in December 2021  Centrilobular emphysema with extensive history of smoking. - had detailed discussion about COPD and emphysema - his PFT showed diffusion defect, but not obstructive defect - minimal respiratory symptoms at present - continue prn albuterol - high dose influenza vaccine today  Obstructive sleep apnea. - he is compliant with CPAP and reports benefit from therapy - he uses [facility] for his DME - continue auto CPAP 5 to 20 cm H2O  History of asbestos exposure. - worked as a Engineer, civil (consulting) fibrillation, ascending  thoracic aortic aneurysm. - followed by Dr. Harl Bowie with cardiology  Time Spent Involved in Patient Care on Day of Examination:  27 minutes  Follow up:  Patient Instructions  High dose flu shot today  Follow up in 3 months   Medication List:   Allergies as of 09/15/2020   No Known Allergies     Medication List       Accurate as of September 15, 2020  1:27 PM. If you have any questions, ask your nurse or doctor.        albuterol 108 (90 Base) MCG/ACT inhaler Commonly known as: VENTOLIN HFA Inhale 2 puffs into the lungs every 6 (six) hours as needed for wheezing or shortness of breath.   b complex vitamins tablet Take 1 tablet by mouth daily.   escitalopram 10 MG tablet Commonly known as: LEXAPRO Take 10 mg by mouth daily.   Gas-X 80 MG chewable tablet Generic drug: simethicone Chew 80 mg by mouth once as needed for flatulence. Reported on 06/20/2016   Melatonin 10 MG Caps Take 10 mg by mouth at bedtime.   metoprolol succinate 25 MG 24 hr tablet Commonly known as: Toprol XL Take 1 tablet (25 mg total) by mouth daily.   omeprazole 20 MG capsule Commonly known as: PRILOSEC Take 20 mg by mouth daily.   rivaroxaban 20 MG Tabs tablet Commonly known as: XARELTO Take 1 tablet (20 mg total) by mouth daily with supper.   simvastatin 40 MG tablet Commonly known as: ZOCOR Take 40 mg by mouth at bedtime.   solifenacin 10 MG tablet Commonly known as: VESICARE Take 10 mg by mouth daily.   tamsulosin 0.4 MG Caps capsule Commonly known as: FLOMAX Take 0.4 mg by mouth daily.   triamcinolone cream 0.1 % Commonly known as: KENALOG Apply topically.       Signature:  Chesley Mires, MD [place] Pager - [phone] 09/15/2020, 1:27 PM

## 2020-09-15 NOTE — Patient Instructions (Addendum)
High dose flu shot today  Follow up in 3 months

## 2020-11-02 ENCOUNTER — Other Ambulatory Visit: Payer: Self-pay

## 2020-11-02 ENCOUNTER — Ambulatory Visit: Payer: Medicare Other | Admitting: Student

## 2020-11-02 ENCOUNTER — Encounter: Payer: Self-pay | Admitting: Student

## 2020-11-02 VITALS — BP 142/80 | HR 96 | Ht 75.0 in | Wt 229.0 lb

## 2020-11-02 DIAGNOSIS — I4811 Longstanding persistent atrial fibrillation: Secondary | ICD-10-CM

## 2020-11-02 DIAGNOSIS — I10 Essential (primary) hypertension: Secondary | ICD-10-CM | POA: Diagnosis not present

## 2020-11-02 DIAGNOSIS — G4733 Obstructive sleep apnea (adult) (pediatric): Secondary | ICD-10-CM | POA: Diagnosis not present

## 2020-11-02 DIAGNOSIS — I712 Thoracic aortic aneurysm, without rupture, unspecified: Secondary | ICD-10-CM

## 2020-11-02 MED ORDER — AMLODIPINE BESYLATE 2.5 MG PO TABS: 2.5000 mg | ORAL_TABLET | Freq: Every day | ORAL | 3 refills | Status: DC

## 2020-11-02 NOTE — Progress Notes (Signed)
Cardiology Office Note    Date:  11/02/2020   ID:  KOL CONSUEGRA, DOB Oct 19, 1941, MRN [id number]  PCP:  Celene Squibb, MD  Cardiologist: Carlyle Dolly, MD    Chief Complaint  Patient presents with  . Follow-up    3 month visit    History of Present Illness:    Jonathan Greene is a 79 y.o. 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  He was last examined by myself in 07/2020 and reported persistent fatigue over the past 3-4 months along with dyspnea on exertion. Denied any chest pain or palpitations. His recent monitor had shown an AF burden of 100% with episodes of atrial fibrillation with RVR, therefore he was started on Toprol-XL 12.5mg  daily with plans to further titrate to 25mg  daily if HR and BP allowed. Was recommended if dyspnea and fatigue persisted, then to obtain a [place] for ischemic evaluation.   In talking with the patient today, he reports overall doing well since his last office visit. He denies any chest pain or palpitations. He does have baseline dyspnea on exertion in the setting of COPD. No recent orthopnea or PND. He does report good compliance with his CPAP at night. Feels like his energy level has overall improved. Going on a fishing trip tomorrow.   He brings with him today a detailed blood pressure log and SBP has been elevated when checked at home ranging from the 140's to 170's. He reports a history of hypotension when on medical therapy for HTN in the past.   Past Medical History:  Diagnosis Date  . A-fib ([place])   . Bradycardia   . COPD (chronic obstructive pulmonary disease) ([place])   . Frequent urination   . GERD (gastroesophageal  reflux disease)   . Hypercholesterolemia   . Hypertension   . Prostate cancer ([place])    RADIATION [place] IMPLANTS 02-28-12  . Sleep apnea   . Stroke ([place])    2011;ST memory loss, no other deficits.  . Urinary urgency     Past Surgical History:  Procedure Laterality Date  . CATARACT EXTRACTION W/PHACO Right 11/06/2013   Procedure: CATARACT EXTRACTION PHACO AND INTRAOCULAR LENS PLACEMENT ([place]);  Surgeon: Tonny Branch, MD;  Location: [facility];  Service: Ophthalmology;  Laterality: Right;  CDE 11.67  . CATARACT EXTRACTION W/PHACO Left 11/25/2015   Procedure: CATARACT EXTRACTION PHACO AND INTRAOCULAR LENS PLACEMENT ; CDE:  8.15;  Surgeon: Tonny Branch, MD;  Location: [facility];  Service: Ophthalmology;  Laterality: Left;  . COLONOSCOPY  10/03/2006   JSH:FWYOVZCHYI rectal polyps cold biopsied/removed, otherwise normal rectum/Sigmoid diverticula.  Remainder of colon mucosa appeared normal  . COLONOSCOPY N/A 08/19/2014   RMR: Radiation proctitis-status post APC ablation. Colonic diverticulosis.  . CYSTOSCOPY  02/28/2012   Procedure: CYSTOSCOPY;  Surgeon: Bernestine Amass, MD;  Location: [facility];  Service: Urology;  Laterality: N/A;   no seeds noted in bladder  . RADIOACTIVE SEED IMPLANT  02/28/2012   Procedure: RADIOACTIVE SEED IMPLANT;  Surgeon: Bernestine Amass, MD;  Location: [facility];  Service: Urology;  Laterality: N/A;  67seeds implanted     Current Medications: Outpatient Medications Prior to Visit  Medication  Sig Dispense Refill  . albuterol (VENTOLIN HFA) 108 (90 Base) MCG/ACT inhaler Inhale 2 puffs into the lungs every 6 (six) hours as needed for wheezing or shortness of breath. 6.7 g 5  . escitalopram (LEXAPRO) 10 MG tablet Take 10 mg by mouth daily.    . Melatonin 10 MG CAPS Take 10 mg by mouth at bedtime.     . metoprolol succinate (TOPROL XL) 25 MG 24 hr tablet Take 1 tablet (25 mg total) by mouth daily. 30 tablet 11  . Multiple Vitamin  (MULTIVITAMIN ADULT) TABS Take by mouth.    Marland Kitchen omeprazole (PRILOSEC) 20 MG capsule Take 20 mg by mouth daily.     . rivaroxaban (XARELTO) 20 MG TABS tablet Take 1 tablet (20 mg total) by mouth daily with supper. 90 tablet 4  . simethicone (GAS-X) 80 MG chewable tablet Chew 80 mg by mouth once as needed for flatulence. Reported on 06/20/2016    . simvastatin (ZOCOR) 40 MG tablet Take 40 mg by mouth at bedtime.     . solifenacin (VESICARE) 10 MG tablet Take 10 mg by mouth daily.     . tamsulosin (FLOMAX) 0.4 MG CAPS capsule Take 0.4 mg by mouth daily.    Marland Kitchen b complex vitamins tablet Take 1 tablet by mouth daily.    Marland Kitchen triamcinolone cream (KENALOG) 0.1 % Apply topically.     No facility-administered medications prior to visit.     Allergies:   Patient has no known allergies.   Social History   Socioeconomic History  . Marital status: Married    Spouse name: Vaughan Basta  . Number of children: 2  . Years of education: Not on file  . Highest education level: Not on file  Occupational History  . Occupation: retired  Tobacco Use  . Smoking status: Former Smoker    Packs/day: 2.00    Years: 35.00    Pack years: 70.00    Types: Cigarettes    Quit date: 02/21/1991    Years since quitting: 29.7  . Smokeless tobacco: Current User    Types: Chew  . Tobacco comment: CHEW TOBACCO FOR 20 YRS   Vaping Use  . Vaping Use: Never used  Substance and Sexual Activity  . Alcohol use: Yes    Alcohol/week: 14.0 standard drinks    Types: 14 Shots of liquor per week    Comment: 4 ounces daily  . Drug use: No  . Sexual activity: Yes    Birth control/protection: None  Other Topics Concern  . Not on file  Social History Narrative  . Not on file   Social Determinants of Health   Financial Resource Strain: Low Risk   . Difficulty of Paying Living Expenses: Not hard at all  Food Insecurity: No Food Insecurity  . Worried About Charity fundraiser in the Last Year: Never true  . Ran Out of Food in the Last  Year: Never true  Transportation Needs: No Transportation Needs  . Lack of Transportation (Medical): No  . Lack of Transportation (Non-Medical): No  Physical Activity:   . Days of Exercise per Week: Not on file  . Minutes of Exercise per Session: Not on file  Stress: No Stress Concern Present  . Feeling of Stress : Not at all  Social Connections: Moderately Isolated  . Frequency of Communication with Friends and Family: More than three times a week  . Frequency of Social Gatherings with Friends and Family: More than three times a week  .  Attends Religious Services: Never  . Active Member of Clubs or Organizations: No  . Attends Archivist Meetings: Never  . Marital Status: Married     Family History:  The patient's family history includes Breast cancer in his sister; COPD in his maternal aunt, sister, and son; Colon cancer in his sister; Heart attack in his brother; Hypertension in his mother and sister; Pneumonia in his sister; Stroke in his mother.   Review of Systems:   Please see the history of present illness.     General:  No chills, fever, night sweats or weight changes.  Cardiovascular:  No chest pain, edema, orthopnea, palpitations, paroxysmal nocturnal dyspnea. Positive for dyspnea on exertion (at baseline).  Dermatological: No rash, lesions/masses Respiratory: No cough, dyspnea Urologic: No hematuria, dysuria Abdominal:   No nausea, vomiting, diarrhea, bright red blood per rectum, melena, or hematemesis Neurologic:  No visual changes, wkns, changes in mental status. All other systems reviewed and are otherwise negative except as noted above.   Physical Exam:    VS:  BP (!) 142/80   Pulse 96   Ht 6\' 3"  (1.905 m)   Wt 229 lb (103.9 kg)   SpO2 97%   BMI 28.62 kg/m    General: Well developed, well nourished,male appearing in no acute distress. Head: Normocephalic, atraumatic. Neck: No carotid bruits. JVD not elevated.  Lungs: Respirations regular and  unlabored, without wheezes or rales.  Heart: Irregularly irregular. No S3 or S4.  No murmur, no rubs, or gallops appreciated. Abdomen: Appears non-distended. No obvious abdominal masses. Msk:  Strength and tone appear normal for age. No obvious joint deformities or effusions. Extremities: No clubbing or cyanosis. No edema.  Distal pedal pulses are 2+ bilaterally. Neuro: Alert and oriented X 3. Moves all extremities spontaneously. No focal deficits noted. Psych:  Responds to questions appropriately with a normal affect. Skin: No rashes or lesions noted  Wt Readings from Last 3 Encounters:  11/02/20 229 lb (103.9 kg)  09/15/20 227 lb 12.8 oz (103.3 kg)  08/18/20 228 lb 6.4 oz (103.6 kg)     Studies/Labs Reviewed:   EKG:  EKG is not ordered today.    Recent Labs: 07/01/2020: Hemoglobin 16.2; Platelets 157   Lipid Panel No results found for: CHOL, TRIG, HDL, CHOLHDL, VLDL, LDLCALC, LDLDIRECT  Additional studies/ records that were reviewed today include:   Echocardiogram: 06/2020  IMPRESSIONS    1. Left ventricular ejection fraction, by estimation, is 55 to 60%. The  left ventricle has normal function. The left ventricle has no regional  wall motion abnormalities. There is moderate left ventricular hypertrophy.  Left ventricular diastolic  parameters are indeterminate in the setting of atrial fibrillation.  2. Right ventricular systolic function is normal. The right ventricular  size is normal.  3. Left atrial size was mildly dilated.  4. The mitral valve is grossly normal. Trivial mitral valve  regurgitation.  5. The aortic valve is tricuspid. Aortic valve regurgitation is not  visualized. Mild aortic valve sclerosis is present, with no evidence of  aortic valve stenosis.  6. The inferior vena cava is normal in size with greater than 50%  respiratory variability, suggesting right atrial pressure of 3 mmHg.   Event Monitor: 06/2020  14 day event monitor  Max HR  190, Min HR 43, Avg HR 87  Atrial firbillation throughout study  Rare ventricular ectopy in the form of isolated PVCs, couplets, triplets.  Reported symptoms correlated with afib with elevated heart rates.  Assessment:    1. Longstanding persistent atrial fibrillation ([place])   2. Essential hypertension   3. Thoracic aortic aneurysm without rupture ([place])   4. OSA (obstructive sleep apnea)      Plan:   In order of problems listed above:  1. Persistent Atrial Fibrillation - He denies any palpitations and heart rate has been well controlled in the 60's to 70's when checked at home. Continue Toprol-XL 25 mg daily for rate control. - He denies any evidence of active bleeding. Remains on Xarelto 20 mg daily for anticoagulation. Hemoglobin was stable at 16.2 and platelets at 157K by labs in 06/2020.  2. HTN - BP has been elevated when checked at home as outlined above and is at 142/80 during today's visit. He previously had episodes of hypotension with Amlodipine at 5 mg daily and 10 mg daily  I recommended that he try this at a lower dose of 2.5 mg daily. He will continue to track readings at home and report back if this remains elevated or he develops recurrent hypotension. Continue Toprol-XL 25 mg daily.  3. Thoracic Aortic Aneurysm - Reviewed with the patient today. Measured at 4.5 cm by imaging in 08/2020. He is scheduled for repeat imaging by Oncology in 11/2020. Would anticipate obtaining repeat imaging at least every year.  4. OSA - Continued compliance with CPAP encouraged.    Medication Adjustments/Labs and Tests Ordered: Current medicines are reviewed at length with the patient today.  Concerns regarding medicines are outlined above.  Medication changes, Labs and Tests ordered today are listed in the Patient Instructions below. Patient Instructions  Medication Instructions:   Start Amlodipine 2.5mg  daily. Follow Blood Pressure at home and report back if abnormal readings.     Labwork:  None  Testing/Procedures:  None  Follow-Up:  In 6 months with [place], PA-C or Dr. Harl Bowie  Any Other Special Instructions Will Be Listed Below (If Applicable).     If you need a refill on your cardiac medications before your next appointment, please call your pharmacy.       Signed, Erma Heritage, PA-C  11/02/2020 4:51 PM    [facility] [address] [place], [place] [address] Phone: [phone] Fax: [phone]

## 2020-11-02 NOTE — Patient Instructions (Signed)
Medication Instructions:   Start Amlodipine 2.5mg  daily. Follow Blood Pressure at home and report back if abnormal readings.   Labwork:  None  Testing/Procedures:  None  Follow-Up:  In 6 months with [place], PA-C or Dr. Harl Bowie  Any Other Special Instructions Will Be Listed Below (If Applicable).     If you need a refill on your cardiac medications before your next appointment, please call your pharmacy.

## 2020-11-18 ENCOUNTER — Ambulatory Visit (HOSPITAL_COMMUNITY)
Admission: RE | Admit: 2020-11-18 | Discharge: 2020-11-18 | Disposition: A | Discharge status: Home / Self Care | Payer: Medicare Other | Source: Ambulatory Visit | Attending: Hematology | Admitting: Hematology

## 2020-11-18 ENCOUNTER — Encounter (HOSPITAL_COMMUNITY): Payer: Self-pay | Admitting: Radiology

## 2020-11-18 ENCOUNTER — Inpatient Hospital Stay (HOSPITAL_COMMUNITY): Payer: Medicare Other | Attending: Hematology and Oncology

## 2020-11-18 ENCOUNTER — Other Ambulatory Visit: Payer: Self-pay

## 2020-11-18 DIAGNOSIS — J449 Chronic obstructive pulmonary disease, unspecified: Secondary | ICD-10-CM | POA: Diagnosis not present

## 2020-11-18 DIAGNOSIS — R918 Other nonspecific abnormal finding of lung field: Secondary | ICD-10-CM | POA: Diagnosis not present

## 2020-11-18 DIAGNOSIS — Z87891 Personal history of nicotine dependence: Secondary | ICD-10-CM | POA: Diagnosis not present

## 2020-11-18 DIAGNOSIS — I712 Thoracic aortic aneurysm, without rupture: Secondary | ICD-10-CM | POA: Insufficient documentation

## 2020-11-18 DIAGNOSIS — Z7901 Long term (current) use of anticoagulants: Secondary | ICD-10-CM | POA: Insufficient documentation

## 2020-11-18 DIAGNOSIS — I4891 Unspecified atrial fibrillation: Secondary | ICD-10-CM | POA: Insufficient documentation

## 2020-11-18 LAB — CBC WITH DIFFERENTIAL/PLATELET
Abs Immature Granulocytes: 0.04 10*3/uL (ref 0.00–0.07)
Basophils Absolute: 0 10*3/uL (ref 0.0–0.1)
Basophils Relative: 1 %
Eosinophils Absolute: 0.2 10*3/uL (ref 0.0–0.5)
Eosinophils Relative: 3 %
HCT: 51.1 % (ref 39.0–52.0)
Hemoglobin: 16.8 g/dL (ref 13.0–17.0)
Immature Granulocytes: 1 %
Lymphocytes Relative: 22 %
Lymphs Abs: 1.5 10*3/uL (ref 0.7–4.0)
MCH: 31.9 pg (ref 26.0–34.0)
MCHC: 32.9 g/dL (ref 30.0–36.0)
MCV: 97.1 fL (ref 80.0–100.0)
Monocytes Absolute: 0.6 10*3/uL (ref 0.1–1.0)
Monocytes Relative: 9 %
Neutro Abs: 4.4 10*3/uL (ref 1.7–7.7)
Neutrophils Relative %: 64 %
Platelets: 158 10*3/uL (ref 150–400)
RBC: 5.26 MIL/uL (ref 4.22–5.81)
RDW: 13.2 % (ref 11.5–15.5)
WBC: 6.7 10*3/uL (ref 4.0–10.5)
nRBC: 0 % (ref 0.0–0.2)

## 2020-11-18 LAB — COMPREHENSIVE METABOLIC PANEL
ALT: 62 U/L — ABNORMAL HIGH (ref 0–44)
AST: 57 U/L — ABNORMAL HIGH (ref 15–41)
Albumin: 3.9 g/dL (ref 3.5–5.0)
Alkaline Phosphatase: 86 U/L (ref 38–126)
Anion gap: 6 (ref 5–15)
BUN: 20 mg/dL (ref 8–23)
CO2: 27 mmol/L (ref 22–32)
Calcium: 9.4 mg/dL (ref 8.9–10.3)
Chloride: 103 mmol/L (ref 98–111)
Creatinine, Ser: 1.15 mg/dL (ref 0.61–1.24)
GFR, Estimated: >60 mL/min (ref >60)
Glucose, Bld: 117 mg/dL — ABNORMAL HIGH (ref 70–99)
Potassium: 4.2 mmol/L (ref 3.5–5.1)
Sodium: 136 mmol/L (ref 135–145)
Total Bilirubin: 1 mg/dL (ref 0.3–1.2)
Total Protein: 7.4 g/dL (ref 6.5–8.1)

## 2020-11-18 MED ORDER — IOHEXOL 300 MG/ML  SOLN
100.0000 mL | Freq: Once | INTRAMUSCULAR | Status: AC | PRN
  Administered 2020-11-18: 75 mL via INTRAVENOUS

## 2020-11-22 NOTE — Progress Notes (Signed)
[place] [address], [place] [address]   CLINIC:  Medical Oncology/Hematology  PCP:  Celene Squibb, MD [address] [name] [address] [phone]   REASON FOR VISIT:  Follow-up for left upper lobe lung mass  PRIOR THERAPY: None  NGS Results: Not done  CURRENT THERAPY: Under work-up  BRIEF ONCOLOGIC HISTORY:  Oncology History   No history exists.    CANCER STAGING: Cancer Staging Prostate cancer [facility]) Staging form: Prostate, AJCC 7th Edition - Clinical: Stage IIA (T2a, N0, M0) - Signed by Rexene Edison, MD on 12/07/2011 - Pathologic: No stage assigned - Unsigned   INTERVAL HISTORY:  Jonathan Greene, a 79 y.o. male, returns for routine follow-up of his left upper lobe lung mass.  Today he reports feeling better and his cough is improving.  He denies having CP or worsening SOB. His appetite is great.  He used to work as a Estate manager/land agent and he has exposure to arsenic and asbestos. He drink 4 oz Bourbon daily Rest of the pertinent ROS reviewed and negative.  REVIEW OF SYSTEMS:  Review of Systems  Constitutional: Positive for fatigue (mild). Negative for appetite change.  Respiratory: Positive for cough (mild) and shortness of breath. Negative for hemoptysis.   Cardiovascular: Negative for chest pain.  Genitourinary: Positive for difficulty urinating (d/t hx of prostate cancer).   Neurological: Negative for dizziness.  All other systems reviewed and are negative.   PAST MEDICAL/SURGICAL HISTORY:  Past Medical History:  Diagnosis Date  . A-fib ([place])   . Bradycardia   . COPD (chronic obstructive pulmonary disease) ([place])   . Frequent urination   . GERD (gastroesophageal reflux disease)   . Hypercholesterolemia   . Hypertension   . Prostate cancer ([place])    RADIATION [place] IMPLANTS 02-28-12  . Sleep apnea   . Stroke ([place])    2011;ST memory loss, no other deficits.  . Urinary urgency    Past  Surgical History:  Procedure Laterality Date  . CATARACT EXTRACTION W/PHACO Right 11/06/2013   Procedure: CATARACT EXTRACTION PHACO AND INTRAOCULAR LENS PLACEMENT ([place]);  Surgeon: Tonny Branch, MD;  Location: [facility];  Service: Ophthalmology;  Laterality: Right;  CDE 11.67  . CATARACT EXTRACTION W/PHACO Left 11/25/2015   Procedure: CATARACT EXTRACTION PHACO AND INTRAOCULAR LENS PLACEMENT ; CDE:  8.15;  Surgeon: Tonny Branch, MD;  Location: [facility];  Service: Ophthalmology;  Laterality: Left;  . COLONOSCOPY  10/03/2006   GNF:[id number] rectal polyps cold biopsied/removed, otherwise normal rectum/Sigmoid diverticula.  Remainder of colon mucosa appeared normal  . COLONOSCOPY N/A 08/19/2014   RMR: Radiation proctitis-status post APC ablation. Colonic diverticulosis.  . CYSTOSCOPY  02/28/2012   Procedure: CYSTOSCOPY;  Surgeon: Bernestine Amass, MD;  Location: [facility];  Service: Urology;  Laterality: N/A;   no seeds noted in bladder  . RADIOACTIVE SEED IMPLANT  02/28/2012   Procedure: RADIOACTIVE SEED IMPLANT;  Surgeon: Bernestine Amass, MD;  Location: [facility];  Service: Urology;  Laterality: N/A;  67seeds implanted     SOCIAL HISTORY:  Social History   Socioeconomic History  . Marital status: Married    Spouse name: Vaughan Basta  . Number of children: 2  . Years of education: Not on file  . Highest education level: Not on file  Occupational History  . Occupation: retired  Tobacco Use  . Smoking status: Former Smoker    Packs/day: 2.00  Years: 35.00    Pack years: 70.00    Types: Cigarettes    Quit date: 02/21/1991    Years since quitting: 29.7  . Smokeless tobacco: Current User    Types: Chew  . Tobacco comment: CHEW TOBACCO FOR 20 YRS   Vaping Use  . Vaping Use: Never used  Substance and Sexual Activity  . Alcohol use: Yes    Alcohol/week: 14.0 standard drinks    Types: 14 Shots of liquor per week    Comment: 4 ounces daily  . Drug use: No  . Sexual  activity: Yes    Birth control/protection: None  Other Topics Concern  . Not on file  Social History Narrative  . Not on file   Social Determinants of Health   Financial Resource Strain: Low Risk   . Difficulty of Paying Living Expenses: Not hard at all  Food Insecurity: No Food Insecurity  . Worried About Charity fundraiser in the Last Year: Never true  . Ran Out of Food in the Last Year: Never true  Transportation Needs: No Transportation Needs  . Lack of Transportation (Medical): No  . Lack of Transportation (Non-Medical): No  Physical Activity: Not on file  Stress: No Stress Concern Present  . Feeling of Stress : Not at all  Social Connections: Moderately Isolated  . Frequency of Communication with Friends and Family: More than three times a week  . Frequency of Social Gatherings with Friends and Family: More than three times a week  . Attends Religious Services: Never  . Active Member of Clubs or Organizations: No  . Attends Archivist Meetings: Never  . Marital Status: Married  Human resources officer Violence: Not At Risk  . Fear of Current or Ex-Partner: No  . Emotionally Abused: No  . Physically Abused: No  . Sexually Abused: No    FAMILY HISTORY:  Family History  Problem Relation Age of Onset  . Stroke Mother   . Hypertension Mother   . Colon cancer Sister        in her 51s  . Breast cancer Sister   . Heart attack Brother   . COPD Maternal Aunt   . COPD Sister   . Hypertension Sister   . Pneumonia Sister   . COPD Son   . Cancer Neg Hx     CURRENT MEDICATIONS:  Current Outpatient Medications  Medication Sig Dispense Refill  . albuterol (VENTOLIN HFA) 108 (90 Base) MCG/ACT inhaler Inhale 2 puffs into the lungs every 6 (six) hours as needed for wheezing or shortness of breath. 6.7 g 5  . amLODipine (NORVASC) 2.5 MG tablet Take 1 tablet (2.5 mg total) by mouth daily. 90 tablet 3  . escitalopram (LEXAPRO) 10 MG tablet Take 10 mg by mouth daily.    .  Melatonin 10 MG CAPS Take 10 mg by mouth at bedtime.     . metoprolol succinate (TOPROL XL) 25 MG 24 hr tablet Take 1 tablet (25 mg total) by mouth daily. 30 tablet 11  . Multiple Vitamin (MULTIVITAMIN ADULT) TABS Take by mouth.    Marland Kitchen omeprazole (PRILOSEC) 20 MG capsule Take 20 mg by mouth daily.     . rivaroxaban (XARELTO) 20 MG TABS tablet Take 1 tablet (20 mg total) by mouth daily with supper. 90 tablet 4  . simethicone (GAS-X) 80 MG chewable tablet Chew 80 mg by mouth once as needed for flatulence. Reported on 06/20/2016    . simvastatin (ZOCOR) 40 MG tablet Take  40 mg by mouth at bedtime.     . solifenacin (VESICARE) 10 MG tablet Take 10 mg by mouth daily.     . tamsulosin (FLOMAX) 0.4 MG CAPS capsule Take 0.4 mg by mouth daily.     No current facility-administered medications for this visit.    ALLERGIES:  No Known Allergies  PHYSICAL EXAM:  Performance status (ECOG): 1 - Symptomatic but completely ambulatory  There were no vitals filed for this visit. Wt Readings from Last 3 Encounters:  11/02/20 229 lb (103.9 kg)  09/15/20 227 lb 12.8 oz (103.3 kg)  08/18/20 228 lb 6.4 oz (103.6 kg)   Physical Exam Vitals reviewed.  Constitutional:      Appearance: Normal appearance.  Cardiovascular:     Rate and Rhythm: Normal rate and regular rhythm.     Pulses: Normal pulses.     Heart sounds: Normal heart sounds.  Pulmonary:     Effort: Pulmonary effort is normal.     Breath sounds: Normal breath sounds.  Neurological:     General: No focal deficit present.     Mental Status: He is alert and oriented to person, place, and time.  Psychiatric:        Mood and Affect: Mood normal.        Behavior: Behavior normal.      LABORATORY DATA:  I have reviewed the labs as listed.  CBC Latest Ref Rng & Units 11/18/2020 07/01/2020 12/31/2019  WBC 4.0 - 10.5 K/uL 6.7 6.9 6.8  Hemoglobin 13.0 - 17.0 g/dL 16.8 16.2 15.4  Hematocrit 39.0 - 52.0 % 51.1 50.0 45.6  Platelets 150 - 400 K/uL 158  157 184   CMP Latest Ref Rng & Units 11/18/2020 01/20/2017 02/29/2016  Glucose 70 - 99 mg/dL 117(H) 154(H) 111(H)  BUN 8 - 23 mg/dL 20 18 32(H)  Creatinine 0.61 - 1.24 mg/dL 1.15 1.25(H) 1.63(H)  Sodium 135 - 145 mmol/L 136 139 135  Potassium 3.5 - 5.1 mmol/L 4.2 4.4 3.5  Chloride 98 - 111 mmol/L 103 103 104  CO2 22 - 32 mmol/L 27 28 21(L)  Calcium 8.9 - 10.3 mg/dL 9.4 9.6 8.5(L)  Total Protein 6.5 - 8.1 g/dL 7.4 7.5 8.1  Total Bilirubin 0.3 - 1.2 mg/dL 1.0 0.9 0.9  Alkaline Phos 38 - 126 U/L 86 69 73  AST 15 - 41 U/L 57(H) 55(H) 63(H)  ALT 0 - 44 U/L 62(H) 42 64(H)    DIAGNOSTIC IMAGING:  I have independently reviewed the scans and discussed with the patient. CT Chest W Contrast  Result Date: 11/19/2020 CLINICAL DATA:  79 year old male with history of non-small cell lung cancer. Follow-up study. EXAM: CT CHEST WITH CONTRAST TECHNIQUE: Multidetector CT imaging of the chest was performed during intravenous contrast administration. CONTRAST:  25mL OMNIPAQUE IOHEXOL 300 MG/ML  SOLN COMPARISON:  Chest CT 06/03/2020. FINDINGS: Cardiovascular: Heart size is mildly enlarged with left atrial dilatation. There is no significant pericardial fluid, thickening or pericardial calcification. There is aortic atherosclerosis, as well as atherosclerosis of the great vessels of the mediastinum and the coronary arteries, including calcified atherosclerotic plaque in the left main, left anterior descending, left circumflex and right coronary arteries. Mediastinum/Nodes: No pathologically enlarged mediastinal or hilar lymph nodes. Esophagus is unremarkable in appearance. No axillary lymphadenopathy. Lungs/Pleura: Sessile area of pleuroparenchymal thickening and architectural distortion in the posterolateral aspect of the left upper lobe (axial image 45 of series 4) less pronounced than the prior PET-CT, corresponding to the site of treated lung  lesion. No other definite suspicious appearing pulmonary nodules or  masses are noted. No acute consolidative airspace disease. No pleural effusions. Diffuse bronchial wall thickening with moderate centrilobular and paraseptal emphysema. Upper Abdomen: Aortic atherosclerosis. Musculoskeletal: There are no aggressive appearing lytic or blastic lesions noted in the visualized portions of the skeleton. Subcutaneous lesion in the medial aspect of the left pectoral region, similar to the prior study, measuring 2.1 x 1.3 cm (44 HU), previously not hypermetabolic on PET CTs, presumably a benign lesion. IMPRESSION: 1. Treated left upper lobe mass shows further signs of regression compared to prior examinations. No definitive findings to suggest locally recurrent disease or metastatic disease in the thorax. 2. Diffuse bronchial wall thickening with moderate centrilobular and paraseptal emphysema; imaging findings suggestive of underlying COPD. 3. Aortic atherosclerosis, in addition to left main and 3 vessel coronary artery disease. Assessment for potential risk factor modification, dietary therapy or pharmacologic therapy may be warranted, if clinically indicated. 4. Cardiomegaly with left atrial dilatation. 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 Electronically Signed   By: Vinnie Langton M.D.   On: 11/19/2020 07:49     ASSESSMENT:  1. Left upper lobe lung mass:  -Recent history of pneumonia followed by abnormal chest x-ray findings. -CT of the chest without contrast on 06/03/2020 showed 2.3 x 2.2 x 4.4 cm subpleural mass like consolidation in the left upper lobe corresponding to the density seen on the prior radiograph. -Patient smoked for 33 years, quit smoking at age 21. He smoked on average of 1 pack/day for most years and at the time of quitting, was smoking 3 packs/day of cigarettes. 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 He reports asbestos exposure throughout his career. 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 -PET scan on 06/16/2020 shows posterior left upper lobe pleural-based hypermetabolic nodule, 2.4 x 2.3 cm SUV 6.2. AP window lymph node measures 6 mm with SUV 4.3. No hypermetabolic extrathoracic metastatic disease. -MRI of the brain on 06/22/2020 shows single suspicious focus of enhancement along the inferolateral margin of the temporal lobe on the left measuring 4 mm, not well seen on sagittal or coronal postcontrast imaging. Unsure if it is small metastasis or a surface vessel. No second suspicious focus identified. 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 -Left upper lobe needle biopsy on 07/01/2020 shows lung tissue with a lymphoplasmacytic infiltrate and organizing pneumonia type changes and fibrosis.  IHC negative for tumor. -PET scan on 08/16/2020 shows subpleural left upper lobe lung nodule measuring 2.2 x 1.2 cm, previously 2.8 x 2.2 cm.  SUV 3.4, previously 6.2.  Previously described hypermetabolic AP window node has resolved.  2. Ascending aortic aneurysm: -4.3 cm aneurysmal dilatation of the ascending aorta was noted on the current CT scan. -Annual imaging by CTA or MRAwas recommended.   PLAN:  1. Left upper lobe lung mass: Recent imaging from Dec 2021 -- Treated left upper lobe mass shows further signs of regression compared to prior examinations. No definitive findings to suggest locally recurrent disease or metastatic disease in the thorax. 2. Diffuse bronchial wall thickening with moderate centrilobular and paraseptal emphysema; imaging findings suggestive of underlying COPD.  No concerns for an active infection today Repeat imaging ordered in 4 months.  2.COPD: -Continue albuterol as needed.  3. Sleep apnea: -Continue CPAP.  4. Atrial fibrillation: -Continue Xarelto.  Rate well controlled.  5.  Encouraged to cut down alcohol intake. He  will discuss with his cardiologist about atherosclerosis mentioned on the CT report He is already on lipid lowering agent.   Orders placed this encounter:  No orders of the defined types were placed in this encounter.

## 2020-11-23 ENCOUNTER — Inpatient Hospital Stay (HOSPITAL_COMMUNITY): Payer: Medicare Other | Admitting: Hematology and Oncology

## 2020-11-23 ENCOUNTER — Other Ambulatory Visit: Payer: Self-pay

## 2020-11-23 ENCOUNTER — Encounter (HOSPITAL_COMMUNITY): Payer: Self-pay | Admitting: Hematology and Oncology

## 2020-11-23 VITALS — BP 128/84 | HR 65 | Temp 96.8°F | Resp 18 | Wt 226.8 lb

## 2020-11-23 DIAGNOSIS — R918 Other nonspecific abnormal finding of lung field: Secondary | ICD-10-CM

## 2020-11-24 ENCOUNTER — Telehealth: Payer: Self-pay | Admitting: Student

## 2020-11-24 NOTE — Telephone Encounter (Signed)
     Please let the patient know I reviewed his blood pressure log and his BP and HR have overall been well-controlled.  Would continue his current medication regimen at this time.   He did leave a note asking me to review his recent Chest CT which was ordered by Oncology. It appears this mentioned evidence of aortic atherosclerosis along with coronary artery calcification.  I typically recommend adding ASA 81 mg daily for this but given that he is on Xarelto for anticoagulation, would not start ASA. He is already on statin therapy with Simvastatin 40 mg daily.  The challenging part with a Chest CT is this does not specify the amount of plaque in an artery and therefore does not assess for a blockage. When it is noted that you have coronary artery calcification, we typically recommend a stress test for further evaluation and this can be obtained as a [employer] (medicine type stress test as opposed to walking on the treadmill) as we had previously discussed when he was having symptoms of dyspnea on exertion or earlier this year. Last stress test was in 06/2017 but he did not reach his target HR at that time. If he is overall feeling well, we can plan to arrange for this at the time of his next office visit and this can be moved up. If he prefers to have the test in the interim, that is fine as well.  Signed, Erma Heritage, PA-C 11/24/2020, 11:32 AM Pager: [phone]

## 2020-11-24 NOTE — Telephone Encounter (Signed)
Spoke with pt and notified him of current recommendations. Pt voiced understanding and stated he would like to have stress test a next visit.

## 2020-11-24 NOTE — Telephone Encounter (Signed)
Called pt and left msg with wife to call back.

## 2021-01-25 ENCOUNTER — Ambulatory Visit: Payer: Medicare Other | Admitting: Physician Assistant

## 2021-01-25 ENCOUNTER — Ambulatory Visit: Payer: Medicare Other | Admitting: Student

## 2021-01-25 NOTE — Progress Notes (Addendum)
Cardiology Office Note    Date:  01/26/2021   ID:  Jonathan Greene, DOB Feb 18, 1941, MRN [id number]  PCP:  Jonathan Squibb, Greene  Cardiologist: Jonathan Dolly, Greene    Chief Complaint  Patient presents with  . Follow-up    2 month visit    History of Present Illness:    Jonathan Greene is a 80 y.o. 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   He was last examined by myself in 10/2020 and reported having baseline dyspnea on exertion due to COPD but denied any associated chest pain or palpitations. His BP was elevated during his visit, therefore it was recommended he resume Amlodipine at 2.5mg  daily as he had hypotension with higher dosing in the past.   He did have a Chest CT with Oncology in 11/2020 which showed regression of his prior LUL mass but was noted to have aortic atherosclerosis and LM and 3-vessel coronary artery calcifications. He was not on ASA due to the need for anticoagulation and was already on statin therapy. Was recommended to consider a stress test at that time but he wished to wait until his follow-up visit.  In talking with the patient today, he reports overall doing well since his last office visit. He does have baseline dyspnea on exertion at times in the setting of COPD but denies any acute changes in this. No recent chest pain or palpitations. He has been hunting frequently and is looking forward to fishing regularly at [facility] over the coming weeks. He denies any recent orthopnea, PND or lower extremity edema.  He reports good compliance with his current medication regimen. Remains on Xarelto for anticoagulation and denies any recent melena, hematochezia or  hematuria.   Past Medical History:  Diagnosis Date  . A-fib ([place])   . Bradycardia   . COPD (chronic obstructive pulmonary disease) ([place])   . Frequent urination   . GERD (gastroesophageal reflux disease)   . Hypercholesterolemia   . Hypertension   . Prostate cancer ([place])    RADIATION [place] IMPLANTS 02-28-12  . Sleep apnea   . Stroke ([place])    2011;ST memory loss, no other deficits.  . Urinary urgency     Past Surgical History:  Procedure Laterality Date  . CATARACT EXTRACTION W/PHACO Right 11/06/2013   Procedure: CATARACT EXTRACTION PHACO AND INTRAOCULAR LENS PLACEMENT ([place]);  Surgeon: Jonathan Greene;  Location: [facility];  Service: Ophthalmology;  Laterality: Right;  CDE 11.67  . CATARACT EXTRACTION W/PHACO Left 11/25/2015   Procedure: CATARACT EXTRACTION PHACO AND INTRAOCULAR LENS PLACEMENT ; CDE:  8.15;  Surgeon: Jonathan Greene;  Location: [facility];  Service: Ophthalmology;  Laterality: Left;  . COLONOSCOPY  10/03/2006   [id number] rectal polyps cold biopsied/removed, otherwise normal rectum/Sigmoid diverticula.  Remainder of colon mucosa appeared normal  . COLONOSCOPY N/A 08/19/2014   RMR: Radiation proctitis-status post APC ablation. Colonic diverticulosis.  . CYSTOSCOPY  02/28/2012   Procedure: CYSTOSCOPY;  Surgeon: Jonathan Greene;  Location: [facility];  Service: Urology;  Laterality: N/A;   no seeds noted in bladder  . RADIOACTIVE SEED IMPLANT  02/28/2012   Procedure: RADIOACTIVE SEED IMPLANT;  Surgeon: Jonathan Eng  Risa Grill, Greene;  Location[facility];  Service: Urology;  Laterality: N/A;  67seeds implanted     Current Medications: Outpatient Medications Prior to Visit  Medication Sig Dispense Refill  . albuterol (VENTOLIN HFA) 108 (90 Base) MCG/ACT inhaler Inhale 2 puffs into the lungs every 6 (six) hours as needed for wheezing or shortness of breath. 6.7 g 5  . amLODipine (NORVASC) 2.5 MG tablet Take 1 tablet (2.5 mg total)  by mouth daily. 90 tablet 3  . escitalopram (LEXAPRO) 10 MG tablet Take 10 mg by mouth daily.    . Melatonin 10 MG CAPS Take 10 mg by mouth at bedtime.     . metoprolol succinate (TOPROL XL) 25 MG 24 hr tablet Take 1 tablet (25 mg total) by mouth daily. 30 tablet 11  . Multiple Vitamin (MULTIVITAMIN ADULT) TABS Take by mouth.    Marland Kitchen omeprazole (PRILOSEC) 20 MG capsule Take 20 mg by mouth daily.    . rivaroxaban (XARELTO) 20 MG TABS tablet Take 1 tablet (20 mg total) by mouth daily with supper. 90 tablet 4  . simethicone (MYLICON) 80 MG chewable tablet Chew 80 mg by mouth once as needed for flatulence. Reported on 06/20/2016    . simvastatin (ZOCOR) 40 MG tablet Take 40 mg by mouth at bedtime.     . solifenacin (VESICARE) 10 MG tablet Take 10 mg by mouth daily.     . tamsulosin (FLOMAX) 0.4 MG CAPS capsule Take 0.4 mg by mouth daily.    . mupirocin ointment (BACTROBAN) 2 % Apply topically.     No facility-administered medications prior to visit.     Allergies:   Patient has no known allergies.   Social History   Socioeconomic History  . Marital status: Married    Spouse name: Jonathan Greene  . Number of children: 2  . Years of education: Not on file  . Highest education level: Not on file  Occupational History  . Occupation: retired  Tobacco Use  . Smoking status: Former Smoker    Packs/day: 2.00    Years: 35.00    Pack years: 70.00    Types: Cigarettes    Quit date: 02/21/1991    Years since quitting: 29.9  . Smokeless tobacco: Current User    Types: Chew  . Tobacco comment: CHEW TOBACCO FOR 20 YRS   Vaping Use  . Vaping Use: Never used  Substance and Sexual Activity  . Alcohol use: Yes    Alcohol/week: 14.0 standard drinks    Types: 14 Shots of liquor per week    Comment: 4 ounces daily  . Drug use: No  . Sexual activity: Yes    Birth control/protection: None  Other Topics Concern  . Not on file  Social History Narrative  . Not on file   Social Determinants of Health    Financial Resource Strain: Low Risk   . Difficulty of Paying Living Expenses: Not hard at all  Food Insecurity: No Food Insecurity  . Worried About Charity fundraiser in the Last Year: Never true  . Ran Out of Food in the Last Year: Never true  Transportation Needs: No Transportation Needs  . Lack of Transportation (Medical): No  . Lack of Transportation (Non-Medical): No  Physical Activity: Not on file  Stress: No Stress Concern Present  . Feeling of Stress : Not at all  Social Connections: Moderately Isolated  . Frequency of Communication with Friends and Family: More than three times a week  .  Frequency of Social Gatherings with Friends and Family: More than three times a week  . Attends Religious Services: Never  . Active Member of Clubs or Organizations: No  . Attends Archivist Meetings: Never  . Marital Status: Married     Family History:  The patient's family history includes Breast cancer in his sister; COPD in his maternal aunt, sister, and son; Colon cancer in his sister; Heart attack in his brother; Hypertension in his mother and sister; Pneumonia in his sister; Stroke in his mother.   Review of Systems:   Please see the history of present illness.     General:  No chills, fever, night sweats or weight changes.  Cardiovascular:  No chest pain, edema, orthopnea, palpitations, paroxysmal nocturnal dyspnea. Positive for dyspnea on exertion.  Dermatological: No rash, lesions/masses Respiratory: No cough, dyspnea Urologic: No hematuria, dysuria Abdominal:   No nausea, vomiting, diarrhea, bright red blood per rectum, melena, or hematemesis Neurologic:  No visual changes, wkns, changes in mental status. All other systems reviewed and are otherwise negative except as noted above.   Physical Exam:    VS:  BP 134/86   Pulse 69   Ht 6\' 2"  (1.88 m)   Wt 232 lb 9.6 oz (105.5 kg)   SpO2 95%   BMI 29.86 kg/m    General: Well developed, well nourished,male  appearing in no acute distress. Head: Normocephalic, atraumatic. Neck: No carotid bruits. JVD not elevated.  Lungs: Respirations regular and unlabored, without wheezes or rales.  Heart: Irregularly irregular. No S3 or S4.  No murmur, no rubs, or gallops appreciated. Abdomen: Appears non-distended. No obvious abdominal masses. Msk:  Strength and tone appear normal for age. No obvious joint deformities or effusions. Extremities: No clubbing or cyanosis. No lower extremity edema.  Distal pedal pulses are 2+ bilaterally. Neuro: Alert and oriented X 3. Moves all extremities spontaneously. No focal deficits noted. Psych:  Responds to questions appropriately with a normal affect. Skin: No rashes or lesions noted  Wt Readings from Last 3 Encounters:  01/26/21 232 lb 9.6 oz (105.5 kg)  11/23/20 226 lb 12.8 oz (102.9 kg)  11/02/20 229 lb (103.9 kg)     Studies/Labs Reviewed:   EKG:  EKG is ordered today.  The ekg ordered today demonstrates rate-controlled atrial fibrillation, HR 69. No acute ST changes when compared to prior tracings.   Recent Labs: 11/18/2020: ALT 62; BUN 20; Creatinine, Ser 1.15; Hemoglobin 16.8; Platelets 158; Potassium 4.2; Sodium 136   Lipid Panel No results found for: CHOL, TRIG, HDL, CHOLHDL, VLDL, LDLCALC, LDLDIRECT  Additional studies/ records that were reviewed today include:   Echocardiogram: 06/2020 IMPRESSIONS    1. Left ventricular ejection fraction, by estimation, is 55 to 60%. The  left ventricle has normal function. The left ventricle has no regional  wall motion abnormalities. There is moderate left ventricular hypertrophy.  Left ventricular diastolic  parameters are indeterminate in the setting of atrial fibrillation.  2. Right ventricular systolic function is normal. The right ventricular  size is normal.  3. Left atrial size was mildly dilated.  4. The mitral valve is grossly normal. Trivial mitral valve  regurgitation.  5. The aortic valve  is tricuspid. Aortic valve regurgitation is not  visualized. Mild aortic valve sclerosis is present, with no evidence of  aortic valve stenosis.  6. The inferior vena cava is normal in size with greater than 50%  respiratory variability, suggesting right atrial pressure of 3 mmHg.   Event Monitor:  06/2020  14 day event monitor  Max HR 190, Min HR 43, Avg HR 87  Atrial firbillation throughout study  Rare ventricular ectopy in the form of isolated PVCs, couplets, triplets.  Reported symptoms correlated with afib with elevated heart rates.     Assessment:    1. Coronary artery calcification seen on CT scan   2. Dyspnea on exertion   3. Longstanding persistent atrial fibrillation ([place])   4. Essential hypertension   5. Mass of upper lobe of left lung   6. Thoracic aortic aneurysm without rupture ([place])      Plan:   In order of problems listed above:  1. Coronary Calcification on CT/Dyspnea on Exertion - Prior Chest CT in 11/2020 showed LM and 3-vessel coronary artery calcifications. He previously had a GXT but this was nondiagnostic as he did not reach his target heart rate. He does have baseline dyspnea on exertion in setting of COPD but denies any progression of his symptoms or associated chest pain. - Reviewed options with the patient today in regards to a [place] or Coronary CT and will plan to proceed with a Coronary CT for further evaluation as long as renal function remains stable (creatinine was at 1.15 in 11/2020). - He is not on ASA given the need for anticoagulation. Will continue Simvastatin 40 mg daily and Toprol-XL 25 mg daily.  2. Persistent Atrial Fibrillation - He denies any recent palpitations and heart rate is well controlled in the 60's during today's visit. Continue Toprol-XL 25 mg daily for rate control. - He denies any evidence of active bleeding. Remains on Xarelto 20 mg daily for anticoagulation.  3. HTN - BP is well controlled at 134/86  during today's visit and has been well controlled when checked at home as well by the provided BP log. Continue current medication regimen with Amlodipine 2.5 mg daily and Toprol-XL 25 mg daily.  4. LUL Mass - Followed by Oncology and felt to be a lymphoplasmacytic infiltrate and organizing pneumonia as it improved by repeat imaging in 08/2020 and had further regressed by repeat imaging in 11/2020. He is due for repeat imaging in 03/2021 and I will reach out to Oncology today to see if the Radiology overread from his upcoming Coronary CT can serve as his next screening evaluation or if he needs to keep the scheduled test.   Addendum: Reviewed with Oncology and his screening Chest CT can be canceled as long as the LUL mass is visualized on the over-read of his Coronary CTA. Will cancel Chest CT in 03/2021 pending Coronary CT results. Patient updated as well.   5. Ascending Thoracic Aortic Aneurysm - Measured 4.5 cm by imaging in 08/2020. Will continue to follow.      Medication Adjustments/Labs and Tests Ordered: Current medicines are reviewed at length with the patient today.  Concerns regarding medicines are outlined above.  Medication changes, Labs and Tests ordered today are listed in the Patient Instructions below. Patient Instructions   Medication Instructions:  Your physician recommends that you continue on your current medications as directed. Please refer to the Current Medication list given to you today.  *If you need a refill on your cardiac medications before your next appointment, please call your pharmacy*   Lab Work:   If you have labs (blood work) drawn today and your tests are completely normal, you will receive your results only by: Marland Kitchen MyChart Message (if you have MyChart) OR . A paper copy in the mail If you  have any lab test that is abnormal or we need to change your treatment, we will call you to review the results.   Testing/Procedures:  Cardiac CT Angiogram A  cardiac CT angiogram is a procedure to look at the heart and the area around the heart. It may be done to help find the cause of chest pains or other symptoms of heart disease. During this procedure, a substance called contrast dye is injected into the blood vessels in the area to be checked. A large X-ray machine, called a CT scanner, then takes detailed pictures of the heart and the surrounding area. The procedure is also sometimes called a coronary CT angiogram, coronary artery scanning, or CTA. A cardiac CT angiogram allows the health care provider to see how well blood is flowing to and from the heart. The health care provider will be able to see if there are any problems, such as:  Blockage or narrowing of the coronary arteries in the heart.  Fluid around the heart.  Signs of weakness or disease in the muscles, valves, and tissues of the heart. Tell a health care provider about:  Any allergies you have. This is especially important if you have had a previous allergic reaction to contrast dye.  All medicines you are taking, including vitamins, herbs, eye drops, creams, and over-the-counter medicines.  Any blood disorders you have.  Any surgeries you have had.  Any medical conditions you have.  Whether you are pregnant or may be pregnant.  Any anxiety disorders, chronic pain, or other conditions you have that may increase your stress or prevent you from lying still. What are the risks? Generally, this is a safe procedure. However, problems may occur, including:  Bleeding.  Infection.  Allergic reactions to medicines or dyes.  Damage to other structures or organs.  Kidney damage from the contrast dye that is used.  Increased risk of cancer from radiation exposure. This risk is low. Talk with your health care provider about: ? The risks and benefits of testing. ? How you can receive the lowest dose of radiation. What happens before the procedure?  Wear comfortable clothing  and remove any jewelry, glasses, dentures, and hearing aids.  Follow instructions from your health care provider about eating and drinking. This may include: ? For 12 hours before the procedure -- avoid caffeine. This includes tea, coffee, soda, energy drinks, and diet pills. Drink plenty of water or other fluids that do not have caffeine in them. Being well hydrated can prevent complications. ? For 4-6 hours before the procedure -- stop eating and drinking. The contrast dye can cause nausea, but this is less likely if your stomach is empty.  Ask your health care provider about changing or stopping your regular medicines. This is especially important if you are taking diabetes medicines, blood thinners, or medicines to treat problems with erections (erectile dysfunction). What happens during the procedure?  Hair on your chest may need to be removed so that small sticky patches called electrodes can be placed on your chest. These will transmit information that helps to monitor your heart during the procedure.  An IV will be inserted into one of your veins.  You might be given a medicine to control your heart rate during the procedure. This will help to ensure that good images are obtained.  You will be asked to lie on an exam table. This table will slide in and out of the CT machine during the procedure.  Contrast dye will be  injected into the IV. You might feel warm, or you may get a metallic taste in your mouth.  You will be given a medicine called nitroglycerin. This will relax or dilate the arteries in your heart.  The table that you are lying on will move into the CT machine tunnel for the scan.  The person running the machine will give you instructions while the scans are being done. You may be asked to: ? Keep your arms above your head. ? Hold your breath. ? Stay very still, even if the table is moving.  When the scanning is complete, you will be moved out of the machine.  The IV  will be removed. The procedure may vary among health care providers and hospitals.   What can I expect after the procedure? After your procedure, it is common to have:  A metallic taste in your mouth from the contrast dye.  A feeling of warmth.  A headache from the nitroglycerin. Follow these instructions at home:  Take over-the-counter and prescription medicines only as told by your health care provider.  If you are told, drink enough fluid to keep your urine pale yellow. This will help to flush the contrast dye out of your body.  Most people can return to their normal activities right after the procedure. Ask your health care provider what activities are safe for you.  It is up to you to get the results of your procedure. Ask your health care provider, or the department that is doing the procedure, when your results will be ready.  Keep all follow-up visits as told by your health care provider. This is important. Contact a health care provider if:  You have any symptoms of allergy to the contrast dye. These include: ? Shortness of breath. ? Rash or hives. ? A racing heartbeat. Summary  A cardiac CT angiogram is a procedure to look at the heart and the area around the heart. It may be done to help find the cause of chest pains or other symptoms of heart disease.  During this procedure, a large X-ray machine, called a CT scanner, takes detailed pictures of the heart and the surrounding area after a contrast dye has been injected into blood vessels in the area.  Ask your health care provider about changing or stopping your regular medicines before the procedure. This is especially important if you are taking diabetes medicines, blood thinners, or medicines to treat erectile dysfunction.  If you are told, drink enough fluid to keep your urine pale yellow. This will help to flush the contrast dye out of your body. This information is not intended to replace advice given to you by  your health care provider. Make sure you discuss any questions you have with your health care provider. Document Revised: 07/16/2019 Document Reviewed: 07/16/2019 Elsevier Patient Education  2021 [place]: At [facility], you and your health needs are our priority.  As part of our continuing mission to provide you with exceptional heart care, we have created designated Provider Care Teams.  These Care Teams include your primary Cardiologist (physician) and Advanced Practice Providers (APPs -  Physician Assistants and Nurse Practitioners) who all work together to provide you with the care you need, when you need it.  We recommend signing up for the patient portal called "MyChart".  Sign up information is provided on this After Visit Summary.  MyChart is used to connect with patients for Virtual Visits (Telemedicine).  Patients are  able to view lab/test results, encounter notes, upcoming appointments, etc.  Non-urgent messages can be sent to your provider as well.   To learn more about what you can do with MyChart, go to [email or web address].    Your next appointment:   6 month(s)  The format for your next appointment:   In Person  Provider:   Bernerd Pho, PA-C   Other Instructions None        Signed, Erma Heritage, PA-C  01/26/2021 5:10 PM    [place] [address] [place], [place] [address] Phone: [phone] Fax: [phone]

## 2021-01-26 ENCOUNTER — Ambulatory Visit: Payer: Medicare Other | Admitting: Student

## 2021-01-26 ENCOUNTER — Encounter: Payer: Self-pay | Admitting: Student

## 2021-01-26 ENCOUNTER — Encounter: Payer: Self-pay | Admitting: *Deleted

## 2021-01-26 ENCOUNTER — Other Ambulatory Visit: Payer: Self-pay

## 2021-01-26 VITALS — BP 134/86 | HR 69 | Ht 74.0 in | Wt 232.6 lb

## 2021-01-26 DIAGNOSIS — R0609 Other forms of dyspnea: Secondary | ICD-10-CM

## 2021-01-26 DIAGNOSIS — R918 Other nonspecific abnormal finding of lung field: Secondary | ICD-10-CM

## 2021-01-26 DIAGNOSIS — I712 Thoracic aortic aneurysm, without rupture, unspecified: Secondary | ICD-10-CM

## 2021-01-26 DIAGNOSIS — R0600 Dyspnea, unspecified: Secondary | ICD-10-CM | POA: Diagnosis not present

## 2021-01-26 DIAGNOSIS — I2510 Atherosclerotic heart disease of native coronary artery without angina pectoris: Secondary | ICD-10-CM | POA: Diagnosis not present

## 2021-01-26 DIAGNOSIS — I10 Essential (primary) hypertension: Secondary | ICD-10-CM | POA: Diagnosis not present

## 2021-01-26 DIAGNOSIS — I4811 Longstanding persistent atrial fibrillation: Secondary | ICD-10-CM | POA: Diagnosis not present

## 2021-01-26 NOTE — Patient Instructions (Addendum)
Medication Instructions:  Your physician recommends that you continue on your current medications as directed. Please refer to the Current Medication list given to you today.  *If you need a refill on your cardiac medications before your next appointment, please call your pharmacy*   Lab Work:   If you have labs (blood work) drawn today and your tests are completely normal, you will receive your results only by: Marland Kitchen MyChart Message (if you have MyChart) OR . A paper copy in the mail If you have any lab test that is abnormal or we need to change your treatment, we will call you to review the results.   Testing/Procedures:  Cardiac CT Angiogram A cardiac CT angiogram is a procedure to look at the heart and the area around the heart. It may be done to help find the cause of chest pains or other symptoms of heart disease. During this procedure, a substance called contrast dye is injected into the blood vessels in the area to be checked. A large X-ray machine, called a CT scanner, then takes detailed pictures of the heart and the surrounding area. The procedure is also sometimes called a coronary CT angiogram, coronary artery scanning, or CTA. A cardiac CT angiogram allows the health care provider to see how well blood is flowing to and from the heart. The health care provider will be able to see if there are any problems, such as:  Blockage or narrowing of the coronary arteries in the heart.  Fluid around the heart.  Signs of weakness or disease in the muscles, valves, and tissues of the heart. Tell a health care provider about:  Any allergies you have. This is especially important if you have had a previous allergic reaction to contrast dye.  All medicines you are taking, including vitamins, herbs, eye drops, creams, and over-the-counter medicines.  Any blood disorders you have.  Any surgeries you have had.  Any medical conditions you have.  Whether you are pregnant or may be  pregnant.  Any anxiety disorders, chronic pain, or other conditions you have that may increase your stress or prevent you from lying still. What are the risks? Generally, this is a safe procedure. However, problems may occur, including:  Bleeding.  Infection.  Allergic reactions to medicines or dyes.  Damage to other structures or organs.  Kidney damage from the contrast dye that is used.  Increased risk of cancer from radiation exposure. This risk is low. Talk with your health care provider about: ? The risks and benefits of testing. ? How you can receive the lowest dose of radiation. What happens before the procedure?  Wear comfortable clothing and remove any jewelry, glasses, dentures, and hearing aids.  Follow instructions from your health care provider about eating and drinking. This may include: ? For 12 hours before the procedure -- avoid caffeine. This includes tea, coffee, soda, energy drinks, and diet pills. Drink plenty of water or other fluids that do not have caffeine in them. Being well hydrated can prevent complications. ? For 4-6 hours before the procedure -- stop eating and drinking. The contrast dye can cause nausea, but this is less likely if your stomach is empty.  Ask your health care provider about changing or stopping your regular medicines. This is especially important if you are taking diabetes medicines, blood thinners, or medicines to treat problems with erections (erectile dysfunction). What happens during the procedure?  Hair on your chest may need to be removed so that small sticky patches  called electrodes can be placed on your chest. These will transmit information that helps to monitor your heart during the procedure.  An IV will be inserted into one of your veins.  You might be given a medicine to control your heart rate during the procedure. This will help to ensure that good images are obtained.  You will be asked to lie on an exam table. This  table will slide in and out of the CT machine during the procedure.  Contrast dye will be injected into the IV. You might feel warm, or you may get a metallic taste in your mouth.  You will be given a medicine called nitroglycerin. This will relax or dilate the arteries in your heart.  The table that you are lying on will move into the CT machine tunnel for the scan.  The person running the machine will give you instructions while the scans are being done. You may be asked to: ? Keep your arms above your head. ? Hold your breath. ? Stay very still, even if the table is moving.  When the scanning is complete, you will be moved out of the machine.  The IV will be removed. The procedure may vary among health care providers and hospitals.   What can I expect after the procedure? After your procedure, it is common to have:  A metallic taste in your mouth from the contrast dye.  A feeling of warmth.  A headache from the nitroglycerin. Follow these instructions at home:  Take over-the-counter and prescription medicines only as told by your health care provider.  If you are told, drink enough fluid to keep your urine pale yellow. This will help to flush the contrast dye out of your body.  Most people can return to their normal activities right after the procedure. Ask your health care provider what activities are safe for you.  It is up to you to get the results of your procedure. Ask your health care provider, or the department that is doing the procedure, when your results will be ready.  Keep all follow-up visits as told by your health care provider. This is important. Contact a health care provider if:  You have any symptoms of allergy to the contrast dye. These include: ? Shortness of breath. ? Rash or hives. ? A racing heartbeat. Summary  A cardiac CT angiogram is a procedure to look at the heart and the area around the heart. It may be done to help find the cause of chest  pains or other symptoms of heart disease.  During this procedure, a large X-ray machine, called a CT scanner, takes detailed pictures of the heart and the surrounding area after a contrast dye has been injected into blood vessels in the area.  Ask your health care provider about changing or stopping your regular medicines before the procedure. This is especially important if you are taking diabetes medicines, blood thinners, or medicines to treat erectile dysfunction.  If you are told, drink enough fluid to keep your urine pale yellow. This will help to flush the contrast dye out of your body. This information is not intended to replace advice given to you by your health care provider. Make sure you discuss any questions you have with your health care provider. Document Revised: 07/16/2019 Document Reviewed: 07/16/2019 Elsevier Patient Education  2021 [place]: At [facility], you and your health needs are our priority.  As part of our continuing mission to  provide you with exceptional heart care, we have created designated Provider Care Teams.  These Care Teams include your primary Cardiologist (physician) and Advanced Practice Providers (APPs -  Physician Assistants and Nurse Practitioners) who all work together to provide you with the care you need, when you need it.  We recommend signing up for the patient portal called "MyChart".  Sign up information is provided on this After Visit Summary.  MyChart is used to connect with patients for Virtual Visits (Telemedicine).  Patients are able to view lab/test results, encounter notes, upcoming appointments, etc.  Non-urgent messages can be sent to your provider as well.   To learn more about what you can do with MyChart, go to [email or web address].    Your next appointment:   6 month(s)  The format for your next appointment:   In Person  Provider:   Bernerd Pho, PA-C   Other Instructions None

## 2021-01-28 NOTE — Telephone Encounter (Signed)
[place] can you change the time of this , patient states he wanted 11am for Cardiac Ct

## 2021-01-31 ENCOUNTER — Encounter: Payer: Self-pay | Admitting: Cardiology

## 2021-02-02 ENCOUNTER — Other Ambulatory Visit: Payer: Self-pay

## 2021-02-02 ENCOUNTER — Telehealth (HOSPITAL_COMMUNITY): Payer: Self-pay | Admitting: *Deleted

## 2021-02-02 DIAGNOSIS — I2510 Atherosclerotic heart disease of native coronary artery without angina pectoris: Secondary | ICD-10-CM

## 2021-02-02 NOTE — Telephone Encounter (Signed)
Reaching out to patient to offer assistance regarding upcoming cardiac imaging study; pt's wife verbalizes understanding of appt date/time, parking situation and where to check in, pre-test NPO status, and verified current allergies; name and call back number provided for further questions should they arise  Gordy Clement RN [facility] [phone] office [phone] cell  Pt's wife stated that pt will get blood work on 02/03/21.

## 2021-02-03 ENCOUNTER — Other Ambulatory Visit (HOSPITAL_COMMUNITY)
Admission: RE | Admit: 2021-02-03 | Discharge: 2021-02-03 | Disposition: A | Discharge status: Home / Self Care | Payer: Medicare Other | Source: Ambulatory Visit | Attending: Student | Admitting: Student

## 2021-02-03 ENCOUNTER — Telehealth: Payer: Self-pay | Admitting: Student

## 2021-02-03 DIAGNOSIS — R0609 Other forms of dyspnea: Secondary | ICD-10-CM

## 2021-02-03 DIAGNOSIS — I2510 Atherosclerotic heart disease of native coronary artery without angina pectoris: Secondary | ICD-10-CM | POA: Diagnosis present

## 2021-02-03 DIAGNOSIS — R0600 Dyspnea, unspecified: Secondary | ICD-10-CM

## 2021-02-03 LAB — COMPREHENSIVE METABOLIC PANEL
ALT: 60 U/L — ABNORMAL HIGH (ref 0–44)
AST: 57 U/L — ABNORMAL HIGH (ref 15–41)
Albumin: 3.7 g/dL (ref 3.5–5.0)
Alkaline Phosphatase: 65 U/L (ref 38–126)
Anion gap: 8 (ref 5–15)
BUN: 19 mg/dL (ref 8–23)
CO2: 27 mmol/L (ref 22–32)
Calcium: 9 mg/dL (ref 8.9–10.3)
Chloride: 103 mmol/L (ref 98–111)
Creatinine, Ser: 1.21 mg/dL (ref 0.61–1.24)
GFR, Estimated: >60 mL/min (ref >60)
Glucose, Bld: 123 mg/dL — ABNORMAL HIGH (ref 70–99)
Potassium: 4.1 mmol/L (ref 3.5–5.1)
Sodium: 138 mmol/L (ref 135–145)
Total Bilirubin: 1.4 mg/dL — ABNORMAL HIGH (ref 0.3–1.2)
Total Protein: 7.1 g/dL (ref 6.5–8.1)

## 2021-02-03 NOTE — Telephone Encounter (Signed)
Patient is returning a call to [facility] in regards to lab results

## 2021-02-04 ENCOUNTER — Other Ambulatory Visit: Payer: Self-pay

## 2021-02-04 ENCOUNTER — Ambulatory Visit (HOSPITAL_COMMUNITY)
Admission: RE | Admit: 2021-02-04 | Discharge: 2021-02-04 | Disposition: A | Discharge status: Home / Self Care | Payer: Medicare Other | Source: Ambulatory Visit | Attending: Student | Admitting: Student

## 2021-02-04 ENCOUNTER — Telehealth (HOSPITAL_COMMUNITY): Payer: Self-pay | Admitting: Emergency Medicine

## 2021-02-04 DIAGNOSIS — R0609 Other forms of dyspnea: Secondary | ICD-10-CM

## 2021-02-04 DIAGNOSIS — R0600 Dyspnea, unspecified: Secondary | ICD-10-CM | POA: Diagnosis present

## 2021-02-04 DIAGNOSIS — I2510 Atherosclerotic heart disease of native coronary artery without angina pectoris: Secondary | ICD-10-CM | POA: Diagnosis present

## 2021-02-04 MED ORDER — NITROGLYCERIN 0.4 MG SL SUBL
SUBLINGUAL_TABLET | SUBLINGUAL | Status: AC
  Filled 2021-02-04: qty 2

## 2021-02-04 MED ORDER — METOPROLOL TARTRATE 5 MG/5ML IV SOLN
INTRAVENOUS | Status: AC
  Filled 2021-02-04: qty 15

## 2021-02-04 MED ORDER — NITROGLYCERIN 0.4 MG SL SUBL
0.8000 mg | SUBLINGUAL_TABLET | SUBLINGUAL | Status: DC | PRN
  Administered 2021-02-04: 0.8 mg via SUBLINGUAL

## 2021-02-04 MED ORDER — IOHEXOL 350 MG/ML SOLN
80.0000 mL | Freq: Once | INTRAVENOUS | Status: AC | PRN
  Administered 2021-02-04: 80 mL via INTRAVENOUS

## 2021-02-04 MED ORDER — METOPROLOL TARTRATE 5 MG/5ML IV SOLN
5.0000 mg | INTRAVENOUS | Status: DC | PRN
  Administered 2021-02-04 (×2): 5 mg via INTRAVENOUS

## 2021-02-04 NOTE — Telephone Encounter (Signed)
Pt calling with questions about medications. Pt questions answered and appreciated the callback  Marchia Bond RN Navigator Cardiac Imaging [facility] [phone] Office  [phone] Cell

## 2021-02-06 ENCOUNTER — Other Ambulatory Visit: Payer: Self-pay

## 2021-02-06 ENCOUNTER — Ambulatory Visit (HOSPITAL_COMMUNITY)
Admission: RE | Admit: 2021-02-06 | Discharge: 2021-02-06 | Disposition: A | Discharge status: Home / Self Care | Payer: Medicare Other | Source: Ambulatory Visit | Attending: Student | Admitting: Student

## 2021-02-06 DIAGNOSIS — I4891 Unspecified atrial fibrillation: Secondary | ICD-10-CM | POA: Diagnosis not present

## 2021-02-06 DIAGNOSIS — R0600 Dyspnea, unspecified: Secondary | ICD-10-CM

## 2021-02-06 DIAGNOSIS — I712 Thoracic aortic aneurysm, without rupture: Secondary | ICD-10-CM | POA: Insufficient documentation

## 2021-02-06 DIAGNOSIS — R931 Abnormal findings on diagnostic imaging of heart and coronary circulation: Secondary | ICD-10-CM | POA: Diagnosis not present

## 2021-02-06 DIAGNOSIS — I2510 Atherosclerotic heart disease of native coronary artery without angina pectoris: Secondary | ICD-10-CM | POA: Diagnosis not present

## 2021-02-06 DIAGNOSIS — R0609 Other forms of dyspnea: Secondary | ICD-10-CM

## 2021-02-07 ENCOUNTER — Encounter: Payer: Self-pay | Admitting: Cardiology

## 2021-02-07 ENCOUNTER — Other Ambulatory Visit: Payer: Self-pay

## 2021-02-07 ENCOUNTER — Encounter: Payer: Self-pay | Admitting: *Deleted

## 2021-02-07 ENCOUNTER — Ambulatory Visit: Payer: Medicare Other | Admitting: Cardiology

## 2021-02-07 VITALS — BP 140/84 | HR 70 | Ht 74.0 in | Wt 234.2 lb

## 2021-02-07 DIAGNOSIS — I712 Thoracic aortic aneurysm, without rupture, unspecified: Secondary | ICD-10-CM

## 2021-02-07 DIAGNOSIS — I10 Essential (primary) hypertension: Secondary | ICD-10-CM | POA: Diagnosis not present

## 2021-02-07 DIAGNOSIS — I2510 Atherosclerotic heart disease of native coronary artery without angina pectoris: Secondary | ICD-10-CM | POA: Diagnosis not present

## 2021-02-07 NOTE — Patient Instructions (Signed)
Your physician recommends that you schedule a follow-up appointment in: 6 MONTHS WITH DR BRANCH  Your physician recommends that you continue on your current medications as directed. Please refer to the Current Medication list given to you today.  Thank you for choosing [facility]!!    

## 2021-02-07 NOTE — Progress Notes (Signed)
Clinical Summary Jonathan Greene is a 80 y.o.male seen today for follow up of the following medical problems. This is a focused visit to discuss recent coronary CTA findings, for more detailed history please refer to prior clinic notes.       1. CAD - noted on prior chest CT - 02/2021 had coronary CTA showed 3 vessel CAD without signs of significant stenosis by FFR - evidence of apical aneurysm suggestiong possible distal vessel infarct.  - no symptoms - he is on xarelto for afib, not on ASA. On statin, beta blocker.     2. HTN - has noticed some low bp's at home. SBPs in 90s at times, can feel dizzy. Will stop his lisionpril and norvasc for a few days when this happens. 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 He is due for repeat imaging in 03/2021 and I will reach out to Oncology today to see if the Radiology overread from his upcoming Coronary CT can serve as his next screening evaluation or if he needs to keep the scheduled test.   Per PA Strader reviewed with Oncology and his screening Chest CT can be canceled as long as the LUL mass is visualized on the over-read of his Coronary CTA. Will cancel Chest CT in 03/2021 pending Coronary CT results. Patient updated as well.     5. Hyperlipidemia - on simva 40mg   06/2019 TC 116 TG 79 HDL 38 LDL 62 Past Medical History:  Diagnosis Date  . A-fib ([place])   . Bradycardia   . COPD (chronic obstructive pulmonary disease) ([place])   . Frequent urination   . GERD (gastroesophageal reflux disease)   . Hypercholesterolemia   . Hypertension   . Prostate cancer ([place])    RADIATION [place] IMPLANTS  02-28-12  . Sleep apnea   . Stroke ([place])    2011;ST memory loss, no other deficits.  . Urinary urgency      No Known Allergies   Current Outpatient Medications  Medication Sig Dispense Refill  . albuterol (VENTOLIN HFA) 108 (90 Base) MCG/ACT inhaler Inhale 2 puffs into the lungs every 6 (six) hours as needed for wheezing or shortness of breath. 6.7 g 5  . amLODipine (NORVASC) 2.5 MG tablet Take 1 tablet (2.5 mg total) by mouth daily. 90 tablet 3  . escitalopram (LEXAPRO) 10 MG tablet Take 10 mg by mouth daily.    . Melatonin 10 MG CAPS Take 10 mg by mouth at bedtime.     . metoprolol succinate (TOPROL XL) 25 MG 24 hr tablet Take 1 tablet (25 mg total) by mouth daily. 30 tablet 11  . Multiple Vitamin (MULTIVITAMIN ADULT) TABS Take by mouth.    Marland Kitchen omeprazole (PRILOSEC) 20 MG capsule Take 20 mg by mouth daily.    . rivaroxaban (XARELTO) 20 MG TABS tablet Take 1 tablet (20 mg total) by mouth daily with supper. 90 tablet 4  . simethicone (MYLICON) 80 MG chewable tablet Chew 80 mg by mouth once as needed for flatulence. Reported on 06/20/2016    . simvastatin (ZOCOR) 40 MG tablet Take 40 mg by mouth at bedtime.     Marland Kitchen  solifenacin (VESICARE) 10 MG tablet Take 10 mg by mouth daily.     . tamsulosin (FLOMAX) 0.4 MG CAPS capsule Take 0.4 mg by mouth daily.     No current facility-administered medications for this visit.     Past Surgical History:  Procedure Laterality Date  . CATARACT EXTRACTION W/PHACO Right 11/06/2013   Procedure: CATARACT EXTRACTION PHACO AND INTRAOCULAR LENS PLACEMENT ([place]);  Surgeon: Tonny Dino Borntreger, MD;  Location: [facility];  Service: Ophthalmology;  Laterality: Right;  CDE 11.67  . CATARACT EXTRACTION W/PHACO Left 11/25/2015   Procedure: CATARACT EXTRACTION PHACO AND INTRAOCULAR LENS PLACEMENT ; CDE:  8.15;  Surgeon: Tonny Corrin Hingle, MD;  Location: [facility];  Service: Ophthalmology;  Laterality: Left;  . COLONOSCOPY  10/03/2006   ATF:TDDUKGURKY rectal polyps cold biopsied/removed,  otherwise normal rectum/Sigmoid diverticula.  Remainder of colon mucosa appeared normal  . COLONOSCOPY N/A 08/19/2014   RMR: Radiation proctitis-status post APC ablation. Colonic diverticulosis.  . CYSTOSCOPY  02/28/2012   Procedure: CYSTOSCOPY;  Surgeon: Bernestine Amass, MD;  Location: [facility];  Service: Urology;  Laterality: N/A;   no seeds noted in bladder  . RADIOACTIVE SEED IMPLANT  02/28/2012   Procedure: RADIOACTIVE SEED IMPLANT;  Surgeon: Bernestine Amass, MD;  [facility];  Service: Urology;  Laterality: N/A;  67seeds implanted      No Known Allergies    Family History  Problem Relation Age of Onset  . Stroke Mother   . Hypertension Mother   . Colon cancer Sister        in her 9s  . Breast cancer Sister   . Heart attack Brother   . COPD Maternal Aunt   . COPD Sister   . Hypertension Sister   . Pneumonia Sister   . COPD Son   . Cancer Neg Hx      Social History Jonathan Greene reports that he quit smoking about 29 years ago. His smoking use included cigarettes. He has a 70.00 pack-year smoking history. His smokeless tobacco use includes chew. Jonathan Greene reports current alcohol use of about 14.0 standard drinks of alcohol per week.   Review of Systems CONSTITUTIONAL: No weight loss, fever, chills, weakness or fatigue.  HEENT: Eyes: No visual loss, blurred vision, double vision or yellow sclerae.No hearing loss, sneezing, congestion, runny nose or sore throat.  SKIN: No rash or itching.  CARDIOVASCULAR: per hpi RESPIRATORY: No shortness of breath, cough or sputum.  GASTROINTESTINAL: No anorexia, nausea, vomiting or diarrhea. No abdominal pain or blood.  GENITOURINARY: No burning on urination, no polyuria NEUROLOGICAL: No headache, dizziness, syncope, paralysis, ataxia, numbness or tingling in the extremities. No change in bowel or bladder control.  MUSCULOSKELETAL: No muscle, back pain, joint pain or stiffness.  LYMPHATICS: No  enlarged nodes. No history of splenectomy.  PSYCHIATRIC: No history of depression or anxiety.  ENDOCRINOLOGIC: No reports of sweating, cold or heat intolerance. No polyuria or polydipsia.  Marland Kitchen   Physical Examination Today's Vitals   02/07/21 1552  BP: 140/84  Pulse: 70  SpO2: 98%  Weight: 234 lb 3.2 oz (106.2 kg)  Height: 6\' 2"  (1.88 m)   Body mass index is 30.07 kg/m.  Gen: resting comfortably, no acute distress HEENT: no scleral icterus, pupils equal round and reactive, no palptable cervical adenopathy,  CV: irreg, no m/r/g, no jvd Resp: Clear to auscultation bilaterally GI: abdomen is soft, non-tender, non-distended, normal bowel sounds, no hepatosplenomegaly MSK: extremities are warm, no edema.  Skin: warm, no  rash Neuro:  no focal deficits Psych: appropriate affect   Diagnostic Studies  02/2015 Echo Study Conclusions  - Left ventricle: The cavity size was normal. Wall thickness was increased in a pattern of mild LVH. Systolic function was normal. The estimated ejection fraction was in the range of 50% to 55%. Wall motion was normal; there were no regional wall motion abnormalities. Doppler parameters are consistent with abnormal left ventricular relaxation (grade 1 diastolic dysfunction). - Aortic valve: Mildly calcified annulus. Trileaflet; mildly thickened leaflets. There was mild regurgitation. Valve area (VTI): 3.12 cm^2. Valve area (Vmax): 3.15 cm^2. - Aortic root: The visualized portion of the proximal ascending aorta is mildly dilated at 4.0 cm. - Mitral valve: Mildly calcified annulus. Mildly thickened leaflets . - Systemic veins: IVC appears small, suggestive of low RA pressures and hypovolemia. - Technically adequate study.  02/2015 Event monitor Mild sinus brady, no tachyarrhythmias  03/2015 PFTs Decreased DLCO  04/2017 24 hr holter  Telemetry tracings show primarily sinus rhythm  Min HR 39, Max HR 87, Avg HR 56  Rare  ventricular ectopy, primarily in the form of isolated PVCs. One 3 beat run of NSVT.  Rare supraventricular ectopy, all in the form of isolated PACs. Some of the PACs are followed by a compensatory pause  No symptoms reported    Assessment and Plan   1. CAD - coronary CTA as reported above, 3 vessel disease not significant by FFR - continue risk factor modification  2. Aortic aneurysm - mild, continue beta blocker and bp control  3. HTN - mildly elevated here, has had prior issues with low bp's and we had to scale his meds back - continue current regimen.       Arnoldo Lenis, M.D.

## 2021-02-09 ENCOUNTER — Telehealth: Payer: Self-pay | Admitting: Cardiology

## 2021-02-09 NOTE — Telephone Encounter (Signed)
Needing orders for his lab work sent to Dr. Juel Burrow office

## 2021-02-09 NOTE — Telephone Encounter (Signed)
Lab results sent to Dr Durene Cal office

## 2021-03-03 ENCOUNTER — Ambulatory Visit: Payer: Medicare Other | Admitting: Student

## 2021-03-23 ENCOUNTER — Inpatient Hospital Stay (HOSPITAL_COMMUNITY): Payer: Medicare Other

## 2021-03-23 ENCOUNTER — Ambulatory Visit (HOSPITAL_COMMUNITY): Payer: Medicare Other

## 2021-03-25 ENCOUNTER — Other Ambulatory Visit (HOSPITAL_COMMUNITY): Payer: Self-pay

## 2021-03-25 ENCOUNTER — Encounter (HOSPITAL_COMMUNITY): Payer: Self-pay

## 2021-03-25 DIAGNOSIS — R918 Other nonspecific abnormal finding of lung field: Secondary | ICD-10-CM

## 2021-03-28 ENCOUNTER — Other Ambulatory Visit: Payer: Self-pay

## 2021-03-28 ENCOUNTER — Inpatient Hospital Stay (HOSPITAL_COMMUNITY): Payer: Medicare Other | Attending: Hematology | Admitting: Hematology

## 2021-03-28 ENCOUNTER — Inpatient Hospital Stay (HOSPITAL_COMMUNITY): Payer: Medicare Other

## 2021-03-28 VITALS — BP 156/102 | HR 64 | Temp 97.8°F | Resp 16 | Wt 229.0 lb

## 2021-03-28 DIAGNOSIS — Z8701 Personal history of pneumonia (recurrent): Secondary | ICD-10-CM | POA: Diagnosis not present

## 2021-03-28 DIAGNOSIS — Z7901 Long term (current) use of anticoagulants: Secondary | ICD-10-CM | POA: Insufficient documentation

## 2021-03-28 DIAGNOSIS — J449 Chronic obstructive pulmonary disease, unspecified: Secondary | ICD-10-CM | POA: Insufficient documentation

## 2021-03-28 DIAGNOSIS — Z8546 Personal history of malignant neoplasm of prostate: Secondary | ICD-10-CM | POA: Insufficient documentation

## 2021-03-28 DIAGNOSIS — Z79899 Other long term (current) drug therapy: Secondary | ICD-10-CM | POA: Insufficient documentation

## 2021-03-28 DIAGNOSIS — G4730 Sleep apnea, unspecified: Secondary | ICD-10-CM | POA: Diagnosis not present

## 2021-03-28 DIAGNOSIS — Z87891 Personal history of nicotine dependence: Secondary | ICD-10-CM | POA: Insufficient documentation

## 2021-03-28 DIAGNOSIS — R918 Other nonspecific abnormal finding of lung field: Secondary | ICD-10-CM | POA: Diagnosis not present

## 2021-03-28 DIAGNOSIS — I4891 Unspecified atrial fibrillation: Secondary | ICD-10-CM | POA: Insufficient documentation

## 2021-03-28 LAB — COMPREHENSIVE METABOLIC PANEL
ALT: 59 U/L — ABNORMAL HIGH (ref 0–44)
AST: 65 U/L — ABNORMAL HIGH (ref 15–41)
Albumin: 3.9 g/dL (ref 3.5–5.0)
Alkaline Phosphatase: 64 U/L (ref 38–126)
Anion gap: 6 (ref 5–15)
BUN: 16 mg/dL (ref 8–23)
CO2: 27 mmol/L (ref 22–32)
Calcium: 9.3 mg/dL (ref 8.9–10.3)
Chloride: 106 mmol/L (ref 98–111)
Creatinine, Ser: 1.07 mg/dL (ref 0.61–1.24)
GFR, Estimated: >60 mL/min (ref >60)
Glucose, Bld: 97 mg/dL (ref 70–99)
Potassium: 5.2 mmol/L — ABNORMAL HIGH (ref 3.5–5.1)
Sodium: 139 mmol/L (ref 135–145)
Total Bilirubin: 1.5 mg/dL — ABNORMAL HIGH (ref 0.3–1.2)
Total Protein: 7 g/dL (ref 6.5–8.1)

## 2021-03-28 LAB — CBC WITH DIFFERENTIAL/PLATELET
Abs Immature Granulocytes: 0.03 10*3/uL (ref 0.00–0.07)
Basophils Absolute: 0 10*3/uL (ref 0.0–0.1)
Basophils Relative: 1 %
Eosinophils Absolute: 0.3 10*3/uL (ref 0.0–0.5)
Eosinophils Relative: 5 %
HCT: 47.1 % (ref 39.0–52.0)
Hemoglobin: 15.3 g/dL (ref 13.0–17.0)
Immature Granulocytes: 1 %
Lymphocytes Relative: 25 %
Lymphs Abs: 1.6 10*3/uL (ref 0.7–4.0)
MCH: 32.7 pg (ref 26.0–34.0)
MCHC: 32.5 g/dL (ref 30.0–36.0)
MCV: 100.6 fL — ABNORMAL HIGH (ref 80.0–100.0)
Monocytes Absolute: 0.7 10*3/uL (ref 0.1–1.0)
Monocytes Relative: 11 %
Neutro Abs: 3.7 10*3/uL (ref 1.7–7.7)
Neutrophils Relative %: 57 %
Platelets: 156 10*3/uL (ref 150–400)
RBC: 4.68 MIL/uL (ref 4.22–5.81)
RDW: 13.2 % (ref 11.5–15.5)
WBC: 6.4 10*3/uL (ref 4.0–10.5)
nRBC: 0 % (ref 0.0–0.2)

## 2021-03-28 NOTE — Progress Notes (Signed)
Jonathan Greene, [place] [address]   CLINIC:  Medical Oncology/Hematology  PCP:  Celene Squibb, MD [address] [name] [address] [phone]   REASON FOR VISIT:  Follow-up for left upper lobe lung mass  PRIOR THERAPY: None  NGS Results: Not done  CURRENT THERAPY: Observation  BRIEF ONCOLOGIC HISTORY:  Oncology History   No history exists.    CANCER STAGING: Cancer Staging Prostate cancer [facility]) Staging form: Prostate, AJCC 7th Edition - Clinical: Stage IIA (T2a, N0, M0) - Signed by Rexene Edison, MD on 12/07/2011 - Pathologic: No stage assigned - Unsigned   INTERVAL HISTORY:  Jonathan Greene, a 80 y.o. male, returns for routine follow-up of his left upper lobe lung mass. Tadarius was last seen by Dr. Benay Pike on 11/23/2020.   Today he reports feeling okay. He denies having any new cough or hemoptysis, though he reports getting fatigued easily and SOB with minimal activity. He continues clearing his throat from the sputum. He continues taking Xarelto daily and albuterol inhaler PRN.   REVIEW OF SYSTEMS:  Review of Systems  Constitutional: Positive for fatigue (25%). Negative for appetite change.  Respiratory: Positive for shortness of breath (w/ minimal exertion). Negative for cough.   Genitourinary: Positive for difficulty urinating.   Musculoskeletal: Positive for arthralgias (5/10 R wrist & R shoulder).  Neurological: Positive for numbness (feet).  All other systems reviewed and are negative.   PAST MEDICAL/SURGICAL HISTORY:  Past Medical History:  Diagnosis Date  . A-fib ([place])   . Bradycardia   . COPD (chronic obstructive pulmonary disease) ([place])   . Frequent urination   . GERD (gastroesophageal reflux disease)   . Hypercholesterolemia   . Hypertension   . Prostate cancer ([place])    RADIATION [place] IMPLANTS 02-28-12  . Sleep apnea   . Stroke ([place])    2011;ST memory loss, no  other deficits.  . Urinary urgency    Past Surgical History:  Procedure Laterality Date  . CATARACT EXTRACTION W/PHACO Right 11/06/2013   Procedure: CATARACT EXTRACTION PHACO AND INTRAOCULAR LENS PLACEMENT ([place]);  Surgeon: Tonny Branch, MD;  Location: [facility];  Service: Ophthalmology;  Laterality: Right;  CDE 11.67  . CATARACT EXTRACTION W/PHACO Left 11/25/2015   Procedure: CATARACT EXTRACTION PHACO AND INTRAOCULAR LENS PLACEMENT ; CDE:  8.15;  Surgeon: Tonny Branch, MD;  Location: [facility];  Service: Ophthalmology;  Laterality: Left;  . COLONOSCOPY  10/03/2006   HCW:CBJSEGBTDV rectal polyps cold biopsied/removed, otherwise normal rectum/Sigmoid diverticula.  Remainder of colon mucosa appeared normal  . COLONOSCOPY N/A 08/19/2014   RMR: Radiation proctitis-status post APC ablation. Colonic diverticulosis.  . CYSTOSCOPY  02/28/2012   Procedure: CYSTOSCOPY;  Surgeon: Bernestine Amass, MD;  Location: [facility];  Service: Urology;  Laterality: N/A;   no seeds noted in bladder  . RADIOACTIVE SEED IMPLANT  02/28/2012   Procedure: RADIOACTIVE SEED IMPLANT;  Surgeon: Bernestine Amass, MD;  Location[facility];  Service: Urology;  Laterality: N/A;  67seeds implanted     SOCIAL HISTORY:  Social History   Socioeconomic History  . Marital status: Married    Spouse name: Vaughan Basta  . Number of children: 2  . Years of education: Not on file  . Highest education level: Not on file  Occupational History  . Occupation: retired  Tobacco Use  . Smoking status: Former Smoker    Packs/day: 2.00  Years: 35.00    Pack years: 70.00    Types: Cigarettes    Quit date: 02/21/1991    Years since quitting: 30.1  . Smokeless tobacco: Current User    Types: Chew  . Tobacco comment: CHEW TOBACCO FOR 20 YRS   Vaping Use  . Vaping Use: Never used  Substance and Sexual Activity  . Alcohol use: Yes    Alcohol/week: 14.0 standard drinks    Types: 14 Shots of liquor per week    Comment: 4  ounces daily  . Drug use: No  . Sexual activity: Yes    Birth control/protection: None  Other Topics Concern  . Not on file  Social History Narrative  . Not on file   Social Determinants of Health   Financial Resource Strain: Low Risk   . Difficulty of Paying Living Expenses: Not hard at all  Food Insecurity: No Food Insecurity  . Worried About Charity fundraiser in the Last Year: Never true  . Ran Out of Food in the Last Year: Never true  Transportation Needs: No Transportation Needs  . Lack of Transportation (Medical): No  . Lack of Transportation (Non-Medical): No  Physical Activity: Not on file  Stress: No Stress Concern Present  . Feeling of Stress : Not at all  Social Connections: Moderately Isolated  . Frequency of Communication with Friends and Family: More than three times a week  . Frequency of Social Gatherings with Friends and Family: More than three times a week  . Attends Religious Services: Never  . Active Member of Clubs or Organizations: No  . Attends Archivist Meetings: Never  . Marital Status: Married  Human resources officer Violence: Not At Risk  . Fear of Current or Ex-Partner: No  . Emotionally Abused: No  . Physically Abused: No  . Sexually Abused: No    FAMILY HISTORY:  Family History  Problem Relation Age of Onset  . Stroke Mother   . Hypertension Mother   . Colon cancer Sister        in her 80s  . Breast cancer Sister   . Heart attack Brother   . COPD Maternal Aunt   . COPD Sister   . Hypertension Sister   . Pneumonia Sister   . COPD Son   . Cancer Neg Hx     CURRENT MEDICATIONS:  Current Outpatient Medications  Medication Sig Dispense Refill  . albuterol (VENTOLIN HFA) 108 (90 Base) MCG/ACT inhaler Inhale 2 puffs into the lungs every 6 (six) hours as needed for wheezing or shortness of breath. 6.7 g 5  . amLODipine (NORVASC) 2.5 MG tablet Take 1 tablet (2.5 mg total) by mouth daily. 90 tablet 3  . escitalopram (LEXAPRO) 10 MG  tablet Take 10 mg by mouth daily.    . Melatonin 10 MG CAPS Take 10 mg by mouth at bedtime.     . metoprolol succinate (TOPROL XL) 25 MG 24 hr tablet Take 1 tablet (25 mg total) by mouth daily. 30 tablet 11  . Multiple Vitamin (MULTIVITAMIN ADULT) TABS Take by mouth.    Marland Kitchen omeprazole (PRILOSEC) 20 MG capsule Take 20 mg by mouth daily.    . rivaroxaban (XARELTO) 20 MG TABS tablet Take 1 tablet (20 mg total) by mouth daily with supper. 90 tablet 4  . simethicone (MYLICON) 80 MG chewable tablet Chew 80 mg by mouth once as needed for flatulence. Reported on 06/20/2016    . simvastatin (ZOCOR) 40 MG tablet Take 40  mg by mouth at bedtime.     . solifenacin (VESICARE) 10 MG tablet Take 10 mg by mouth daily.     . tamsulosin (FLOMAX) 0.4 MG CAPS capsule Take 0.4 mg by mouth daily.     No current facility-administered medications for this visit.    ALLERGIES:  No Known Allergies  PHYSICAL EXAM:  Performance status (ECOG): 1 - Symptomatic but completely ambulatory  Vitals:   03/28/21 1143  BP: (!) 156/102  Pulse: 64  Resp: 16  Temp: 97.8 F (36.6 C)  SpO2: 97%   Wt Readings from Last 3 Encounters:  03/28/21 229 lb (103.9 kg)  02/07/21 234 lb 3.2 oz (106.2 kg)  01/26/21 232 lb 9.6 oz (105.5 kg)   Physical Exam Vitals reviewed.  Constitutional:      Appearance: Normal appearance.  Cardiovascular:     Rate and Rhythm: Normal rate and regular rhythm.     Pulses: Normal pulses.     Heart sounds: Normal heart sounds.  Pulmonary:     Effort: Pulmonary effort is normal.     Breath sounds: Normal breath sounds.  Abdominal:     Palpations: Abdomen is soft. There is no hepatomegaly, splenomegaly or mass.     Tenderness: There is no abdominal tenderness.  Musculoskeletal:     Right lower leg: No edema.     Left lower leg: No edema.  Neurological:     General: No focal deficit present.     Mental Status: He is alert and oriented to person, place, and time.  Psychiatric:        Mood and  Affect: Mood normal.        Behavior: Behavior normal.      LABORATORY DATA:  I have reviewed the labs as listed.  CBC Latest Ref Rng & Units 03/28/2021 11/18/2020 07/01/2020  WBC 4.0 - 10.5 K/uL 6.4 6.7 6.9  Hemoglobin 13.0 - 17.0 g/dL 15.3 16.8 16.2  Hematocrit 39.0 - 52.0 % 47.1 51.1 50.0  Platelets 150 - 400 K/uL 156 158 157   CMP Latest Ref Rng & Units 03/28/2021 02/03/2021 11/18/2020  Glucose 70 - 99 mg/dL 97 123(H) 117(H)  BUN 8 - 23 mg/dL 16 19 20   Creatinine 0.61 - 1.24 mg/dL 1.07 1.21 1.15  Sodium 135 - 145 mmol/L 139 138 136  Potassium 3.5 - 5.1 mmol/L 5.2(H) 4.1 4.2  Chloride 98 - 111 mmol/L 106 103 103  CO2 22 - 32 mmol/L 27 27 27   Calcium 8.9 - 10.3 mg/dL 9.3 9.0 9.4  Total Protein 6.5 - 8.1 g/dL 7.0 7.1 7.4  Total Bilirubin 0.3 - 1.2 mg/dL 1.5(H) 1.4(H) 1.0  Alkaline Phos 38 - 126 U/L 64 65 86  AST 15 - 41 U/L 65(H) 57(H) 57(H)  ALT 0 - 44 U/L 59(H) 60(H) 62(H)    DIAGNOSTIC IMAGING:  I have independently reviewed the scans and discussed with the patient. No results found.   ASSESSMENT:  1. Left upper lobe lung mass: -Recent history of pneumonia followed by abnormal chest x-ray findings. -CT of the chest without contrast on 06/03/2020 showed 2.3 x 2.2 x 4.4 cm subpleural mass like consolidation in the left upper lobe corresponding to the density seen on the prior radiograph. -Patient smoked for 33 years, quit smoking at age 19. He smoked on average of 1 pack/day for most years and at the time of quitting, was smoking 3 packs/day of cigarettes. 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 He reports asbestos exposure  throughout his career. 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 -PET scan on 06/16/2020 shows posterior left upper lobe pleural-based hypermetabolic nodule, 2.4 x 2.3 cm SUV 6.2. AP window lymph node measures 6 mm with SUV 4.3. No hypermetabolic  extrathoracic metastatic disease. -MRI of the brain on 06/22/2020 shows single suspicious focus of enhancement along the inferolateral margin of the temporal lobe on the left measuring 4 mm, not well seen on sagittal or coronal postcontrast imaging. Unsure if it is small metastasis or a surface vessel. No second suspicious focus identified. 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 -Left upper lobe needle biopsy on 07/01/2020 shows lung tissue with a lymphoplasmacytic infiltrate and organizing pneumonia type changes and fibrosis.  IHC negative for tumor. -PET scan on 08/16/2020 shows subpleural left upper lobe lung nodule measuring 2.2 x 1.2 cm, previously 2.8 x 2.2 cm.  SUV 3.4, previously 6.2.  Previously described hypermetabolic AP window node has resolved. - CT chest on 11/18/2020 showed further signs of regression of the left upper lobe mass.  2. Ascending aortic aneurysm: -4.3 cm aneurysmal dilatation of the ascending aorta was noted on the current CT scan. -Annual imaging by CTA or MRAwas recommended.   PLAN:  1. Left upper lobe lung mass: -He has baseline cough with clear expectoration. - Last CT in December showed further regression of the left upper lobe mass. - CT coronaries on 02/04/2021 showed visualized lungs with no evidence of nodules or consolidation.  The left upper lobe lung mass was not included in the CT coronaries. - Recommend follow-up of the left upper lobe lung nodule with repeat CT scan of the chest in 2-3 months.  2.COPD: -Continue albuterol as needed.  3. Sleep apnea: -Continue CPAP.  4. Atrial fibrillation: -Continue Xarelto.  Rate is well controlled.   Orders placed this encounter:  No orders of the defined types were placed in this encounter.    Derek Jack, MD [place] [phone]   I, Milinda Antis, am acting as a scribe for Dr. Sanda Linger.  I, Derek Jack MD, have reviewed the above documentation for accuracy and completeness, and I agree with the above.

## 2021-03-28 NOTE — Patient Instructions (Signed)
[place] at [facility] Discharge Instructions  You were seen today by Dr. Delton Coombes. He went over your recent results and scans. You will be scheduled to have a CT scan of your chest done before your next visit. Dr. Delton Coombes will see you back in 2 months for labs and follow up.   Thank you for choosing [place] at [facility] to provide your oncology and hematology care.  To afford each patient quality time with our provider, please arrive at least 15 minutes before your scheduled appointment time.   If you have a lab appointment with the [facility] please come in thru the Main Entrance and check in at the main information desk  You need to re-schedule your appointment should you arrive 10 or more minutes late.  We strive to give you quality time with our providers, and arriving late affects you and other patients whose appointments are after yours.  Also, if you no show three or more times for appointments you may be dismissed from the clinic at the providers discretion.     Again, thank you for choosing [facility].  Our hope is that these requests will decrease the amount of time that you wait before being seen by our physicians.       _____________________________________________________________  Should you have questions after your visit to [facility], please contact our office at [phone] between the hours of 8:00 a.m. and 4:30 p.m.  Voicemails left after 4:00 p.m. will not be returned until the following business day.  For prescription refill requests, have your pharmacy contact our office and allow 72 hours.    Cancer Center Support Programs:   > Cancer Support Group  2nd Tuesday of the month 1pm-2pm, Journey Room

## 2021-05-23 ENCOUNTER — Other Ambulatory Visit: Payer: Self-pay

## 2021-05-23 ENCOUNTER — Ambulatory Visit (HOSPITAL_COMMUNITY)
Admission: RE | Admit: 2021-05-23 | Discharge: 2021-05-23 | Disposition: A | Discharge status: Home / Self Care | Payer: Medicare Other | Source: Ambulatory Visit | Attending: Hematology | Admitting: Hematology

## 2021-05-23 ENCOUNTER — Inpatient Hospital Stay (HOSPITAL_COMMUNITY): Payer: Medicare Other | Attending: Hematology

## 2021-05-23 ENCOUNTER — Encounter (HOSPITAL_COMMUNITY): Payer: Self-pay | Admitting: Radiology

## 2021-05-23 DIAGNOSIS — Z7901 Long term (current) use of anticoagulants: Secondary | ICD-10-CM | POA: Diagnosis not present

## 2021-05-23 DIAGNOSIS — R918 Other nonspecific abnormal finding of lung field: Secondary | ICD-10-CM | POA: Diagnosis not present

## 2021-05-23 DIAGNOSIS — Z79899 Other long term (current) drug therapy: Secondary | ICD-10-CM | POA: Insufficient documentation

## 2021-05-23 DIAGNOSIS — I4891 Unspecified atrial fibrillation: Secondary | ICD-10-CM | POA: Insufficient documentation

## 2021-05-23 DIAGNOSIS — Z8546 Personal history of malignant neoplasm of prostate: Secondary | ICD-10-CM | POA: Insufficient documentation

## 2021-05-23 DIAGNOSIS — G4730 Sleep apnea, unspecified: Secondary | ICD-10-CM | POA: Diagnosis not present

## 2021-05-23 DIAGNOSIS — J449 Chronic obstructive pulmonary disease, unspecified: Secondary | ICD-10-CM | POA: Diagnosis not present

## 2021-05-23 LAB — COMPREHENSIVE METABOLIC PANEL
ALT: 73 U/L — ABNORMAL HIGH (ref 0–44)
AST: 70 U/L — ABNORMAL HIGH (ref 15–41)
Albumin: 4.1 g/dL (ref 3.5–5.0)
Alkaline Phosphatase: 78 U/L (ref 38–126)
Anion gap: 8 (ref 5–15)
BUN: 14 mg/dL (ref 8–23)
CO2: 28 mmol/L (ref 22–32)
Calcium: 9.5 mg/dL (ref 8.9–10.3)
Chloride: 102 mmol/L (ref 98–111)
Creatinine, Ser: 1.17 mg/dL (ref 0.61–1.24)
GFR, Estimated: >60 mL/min (ref >60)
Glucose, Bld: 99 mg/dL (ref 70–99)
Potassium: 4.7 mmol/L (ref 3.5–5.1)
Sodium: 138 mmol/L (ref 135–145)
Total Bilirubin: 0.7 mg/dL (ref 0.3–1.2)
Total Protein: 7.2 g/dL (ref 6.5–8.1)

## 2021-05-23 LAB — CBC WITH DIFFERENTIAL/PLATELET
Abs Immature Granulocytes: 0.03 10*3/uL (ref 0.00–0.07)
Basophils Absolute: 0 10*3/uL (ref 0.0–0.1)
Basophils Relative: 1 %
Eosinophils Absolute: 0.2 10*3/uL (ref 0.0–0.5)
Eosinophils Relative: 3 %
HCT: 49.2 % (ref 39.0–52.0)
Hemoglobin: 16.1 g/dL (ref 13.0–17.0)
Immature Granulocytes: 0 %
Lymphocytes Relative: 22 %
Lymphs Abs: 1.7 10*3/uL (ref 0.7–4.0)
MCH: 32.8 pg (ref 26.0–34.0)
MCHC: 32.7 g/dL (ref 30.0–36.0)
MCV: 100.2 fL — ABNORMAL HIGH (ref 80.0–100.0)
Monocytes Absolute: 0.7 10*3/uL (ref 0.1–1.0)
Monocytes Relative: 9 %
Neutro Abs: 5.1 10*3/uL (ref 1.7–7.7)
Neutrophils Relative %: 65 %
Platelets: 149 10*3/uL — ABNORMAL LOW (ref 150–400)
RBC: 4.91 MIL/uL (ref 4.22–5.81)
RDW: 13 % (ref 11.5–15.5)
WBC: 7.8 10*3/uL (ref 4.0–10.5)
nRBC: 0 % (ref 0.0–0.2)

## 2021-05-23 MED ORDER — IOHEXOL 300 MG/ML  SOLN
75.0000 mL | Freq: Once | INTRAMUSCULAR | Status: AC | PRN
  Administered 2021-05-23: 10:00:00 75 mL via INTRAVENOUS

## 2021-05-24 LAB — CEA: CEA: 3.4 ng/mL (ref 0.0–4.7)

## 2021-05-30 ENCOUNTER — Ambulatory Visit (HOSPITAL_COMMUNITY): Payer: Medicare Other | Admitting: Hematology

## 2021-06-15 ENCOUNTER — Ambulatory Visit (HOSPITAL_COMMUNITY): Payer: Medicare Other | Admitting: Hematology and Oncology

## 2021-07-18 ENCOUNTER — Emergency Department (HOSPITAL_COMMUNITY)
Admission: EM | Admit: 2021-07-18 | Discharge: 2021-07-18 | Disposition: A | Discharge status: Home / Self Care | Payer: Medicare Other | Attending: Emergency Medicine | Admitting: Emergency Medicine

## 2021-07-18 ENCOUNTER — Emergency Department (HOSPITAL_COMMUNITY): Payer: Medicare Other

## 2021-07-18 ENCOUNTER — Encounter (HOSPITAL_COMMUNITY): Payer: Self-pay | Admitting: Emergency Medicine

## 2021-07-18 ENCOUNTER — Other Ambulatory Visit: Payer: Self-pay

## 2021-07-18 DIAGNOSIS — R042 Hemoptysis: Secondary | ICD-10-CM | POA: Insufficient documentation

## 2021-07-18 DIAGNOSIS — Z7901 Long term (current) use of anticoagulants: Secondary | ICD-10-CM | POA: Insufficient documentation

## 2021-07-18 DIAGNOSIS — J181 Lobar pneumonia, unspecified organism: Secondary | ICD-10-CM | POA: Insufficient documentation

## 2021-07-18 DIAGNOSIS — I4891 Unspecified atrial fibrillation: Secondary | ICD-10-CM | POA: Diagnosis not present

## 2021-07-18 DIAGNOSIS — I10 Essential (primary) hypertension: Secondary | ICD-10-CM | POA: Insufficient documentation

## 2021-07-18 DIAGNOSIS — Z20822 Contact with and (suspected) exposure to covid-19: Secondary | ICD-10-CM | POA: Insufficient documentation

## 2021-07-18 DIAGNOSIS — J189 Pneumonia, unspecified organism: Secondary | ICD-10-CM

## 2021-07-18 DIAGNOSIS — Z87891 Personal history of nicotine dependence: Secondary | ICD-10-CM | POA: Diagnosis not present

## 2021-07-18 DIAGNOSIS — Z79899 Other long term (current) drug therapy: Secondary | ICD-10-CM | POA: Diagnosis not present

## 2021-07-18 DIAGNOSIS — J449 Chronic obstructive pulmonary disease, unspecified: Secondary | ICD-10-CM | POA: Insufficient documentation

## 2021-07-18 DIAGNOSIS — Z8546 Personal history of malignant neoplasm of prostate: Secondary | ICD-10-CM | POA: Diagnosis not present

## 2021-07-18 DIAGNOSIS — R079 Chest pain, unspecified: Secondary | ICD-10-CM

## 2021-07-18 HISTORY — DX: Acute myocardial infarction, unspecified: I21.9

## 2021-07-18 LAB — CBC WITH DIFFERENTIAL/PLATELET
Abs Immature Granulocytes: 0.05 10*3/uL (ref 0.00–0.07)
Basophils Absolute: 0 10*3/uL (ref 0.0–0.1)
Basophils Relative: 0 %
Eosinophils Absolute: 0.2 10*3/uL (ref 0.0–0.5)
Eosinophils Relative: 1 %
HCT: 47.9 % (ref 39.0–52.0)
Hemoglobin: 15.9 g/dL (ref 13.0–17.0)
Immature Granulocytes: 0 %
Lymphocytes Relative: 9 %
Lymphs Abs: 1.1 10*3/uL (ref 0.7–4.0)
MCH: 32.6 pg (ref 26.0–34.0)
MCHC: 33.2 g/dL (ref 30.0–36.0)
MCV: 98.4 fL (ref 80.0–100.0)
Monocytes Absolute: 0.5 10*3/uL (ref 0.1–1.0)
Monocytes Relative: 4 %
Neutro Abs: 9.8 10*3/uL — ABNORMAL HIGH (ref 1.7–7.7)
Neutrophils Relative %: 86 %
Platelets: 134 10*3/uL — ABNORMAL LOW (ref 150–400)
RBC: 4.87 MIL/uL (ref 4.22–5.81)
RDW: 13.5 % (ref 11.5–15.5)
WBC: 11.5 10*3/uL — ABNORMAL HIGH (ref 4.0–10.5)
nRBC: 0 % (ref 0.0–0.2)

## 2021-07-18 LAB — COMPREHENSIVE METABOLIC PANEL
ALT: 56 U/L — ABNORMAL HIGH (ref 0–44)
AST: 54 U/L — ABNORMAL HIGH (ref 15–41)
Albumin: 3.8 g/dL (ref 3.5–5.0)
Alkaline Phosphatase: 67 U/L (ref 38–126)
Anion gap: 7 (ref 5–15)
BUN: 14 mg/dL (ref 8–23)
CO2: 25 mmol/L (ref 22–32)
Calcium: 8.7 mg/dL — ABNORMAL LOW (ref 8.9–10.3)
Chloride: 103 mmol/L (ref 98–111)
Creatinine, Ser: 1.16 mg/dL (ref 0.61–1.24)
GFR, Estimated: >60 mL/min (ref >60)
Glucose, Bld: 142 mg/dL — ABNORMAL HIGH (ref 70–99)
Potassium: 4.3 mmol/L (ref 3.5–5.1)
Sodium: 135 mmol/L (ref 135–145)
Total Bilirubin: 1.6 mg/dL — ABNORMAL HIGH (ref 0.3–1.2)
Total Protein: 7.1 g/dL (ref 6.5–8.1)

## 2021-07-18 LAB — RESP PANEL BY RT-PCR (FLU A&B, COVID) ARPGX2
Influenza A by PCR: NEGATIVE
Influenza B by PCR: NEGATIVE
SARS Coronavirus 2 by RT PCR: NEGATIVE

## 2021-07-18 LAB — TROPONIN I (HIGH SENSITIVITY)
Troponin I (High Sensitivity): 3 ng/L (ref <18)
Troponin I (High Sensitivity): 3 ng/L (ref <18)

## 2021-07-18 LAB — PROTIME-INR
INR: 2.2 — ABNORMAL HIGH (ref 0.8–1.2)
Prothrombin Time: 24.5 seconds — ABNORMAL HIGH (ref 11.4–15.2)

## 2021-07-18 MED ORDER — IOHEXOL 350 MG/ML SOLN
100.0000 mL | Freq: Once | INTRAVENOUS | Status: AC | PRN
  Administered 2021-07-18: 100 mL via INTRAVENOUS

## 2021-07-18 MED ORDER — CEFTRIAXONE SODIUM 1 G IJ SOLR
1.0000 g | Freq: Once | INTRAMUSCULAR | Status: AC
  Administered 2021-07-18: 1 g via INTRAMUSCULAR
  Filled 2021-07-18: qty 10

## 2021-07-18 MED ORDER — AMOXICILLIN-POT CLAVULANATE 875-125 MG PO TABS: 1.0000 | ORAL_TABLET | Freq: Two times a day (BID) | ORAL | 0 refills | Status: AC

## 2021-07-18 MED ORDER — LIDOCAINE HCL (PF) 1 % IJ SOLN
INTRAMUSCULAR | Status: AC
  Filled 2021-07-18: qty 5

## 2021-07-18 MED ORDER — DOXYCYCLINE HYCLATE 100 MG PO TABS
100.0000 mg | ORAL_TABLET | Freq: Once | ORAL | Status: AC
  Administered 2021-07-18: 100 mg via ORAL
  Filled 2021-07-18: qty 1

## 2021-07-18 MED ORDER — DOXYCYCLINE HYCLATE 100 MG PO CAPS: 100.0000 mg | ORAL_CAPSULE | Freq: Two times a day (BID) | ORAL | 0 refills | Status: AC

## 2021-07-18 NOTE — Progress Notes (Signed)
[place], [place] [address]   CLINIC:  Medical Oncology/Hematology  PCP:  Pablo Lawrence, NP [address] [place] [address] [phone]   REASON FOR VISIT:  Follow-up for  left upper lobe lung mass  PRIOR THERAPY: none  NGS Results: not done  CURRENT THERAPY: surveillance  BRIEF ONCOLOGIC HISTORY:  Oncology History   No history exists.    CANCER STAGING: Cancer Staging Prostate cancer [facility]) Staging form: Prostate, AJCC 7th Edition - Clinical: Stage IIA (T2a, N0, M0) - Signed by Rexene Edison, MD on 12/07/2011 - Pathologic: No stage assigned - Unsigned   INTERVAL HISTORY:  Mr. KYJUAN GAUSE, a 80 y.o. male, returns for routine follow-up of his  left upper lobe lung mass. Jhoan was last seen on 03/28/21.   Today he reports feeling  well. He reported to the ED yesterday for a cough productive of blood and fatigue; there he was diagnosed with pneumonia. His cough and energy levels have improved.    REVIEW OF SYSTEMS:  Review of Systems  Constitutional:  Positive for fatigue (40%). Negative for appetite change.  Respiratory:  Positive for cough and shortness of breath.   Cardiovascular:  Positive for palpitations (Afib).  Neurological:  Positive for dizziness.  All other systems reviewed and are negative.  PAST MEDICAL/SURGICAL HISTORY:  Past Medical History:  Diagnosis Date   A-fib (HCC)    Bradycardia    COPD (chronic obstructive pulmonary disease) (HCC)    Frequent urination    GERD (gastroesophageal reflux disease)    Hypercholesterolemia    Hypertension    MI (myocardial infarction) ([place])    Prostate cancer ([place])    RADIATION TX AND SCHEDULED FOR SEED IMPLANTS 02-28-12   Sleep apnea    Stroke ([place])    2011;ST memory loss, no other deficits.   Urinary urgency    Past Surgical History:  Procedure Laterality Date   CATARACT EXTRACTION W/PHACO Right 11/06/2013   Procedure: CATARACT  EXTRACTION PHACO AND INTRAOCULAR LENS PLACEMENT (IOC);  Surgeon: Tonny Branch, MD;  Location: [facility];  Service: Ophthalmology;  Laterality: Right;  CDE 11.67   CATARACT EXTRACTION W/PHACO Left 11/25/2015   Procedure: CATARACT EXTRACTION PHACO AND INTRAOCULAR LENS PLACEMENT ; CDE:  8.15;  Surgeon: Tonny Branch, MD;  Location: [facility];  Service: Ophthalmology;  Laterality: Left;   COLONOSCOPY  10/03/2006   [id number] rectal polyps cold biopsied/removed, otherwise normal rectum/Sigmoid diverticula.  Remainder of colon mucosa appeared normal   COLONOSCOPY N/A 08/19/2014   RMR: Radiation proctitis-status post APC ablation. Colonic diverticulosis.   CYSTOSCOPY  02/28/2012   Procedure: CYSTOSCOPY;  Surgeon: Bernestine Amass, MD;  Location: [facility];  Service: Urology;  Laterality: N/A;   no seeds noted in bladder   RADIOACTIVE SEED IMPLANT  02/28/2012   Procedure: RADIOACTIVE SEED IMPLANT;  Surgeon: Bernestine Amass, MD;  [facility];  Service: Urology;  Laterality: N/A;  67seeds implanted     SOCIAL HISTORY:  Social History   Socioeconomic History   Marital status: Married    Spouse name: Vaughan Basta   Number of children: 2   Years of education: Not on file   Highest education level: Not on file  Occupational History   Occupation: retired  Tobacco Use   Smoking status: Former    Packs/day: 2.00    Years: 35.00    Pack years: 70.00    Types: Cigarettes  Quit date: 02/21/1991    Years since quitting: 30.4   Smokeless tobacco: Current    Types: Chew   Tobacco comments:    CHEW TOBACCO FOR 20 YRS   Vaping Use   Vaping Use: Never used  Substance and Sexual Activity   Alcohol use: Yes    Alcohol/week: 14.0 standard drinks    Types: 14 Shots of liquor per week    Comment: 4 ounces daily   Drug use: No   Sexual activity: Yes    Birth control/protection: None  Other Topics Concern   Not on file  Social History Narrative   Not on file   Social  Determinants of Health   Financial Resource Strain: Not on file  Food Insecurity: Not on file  Transportation Needs: Not on file  Physical Activity: Not on file  Stress: Not on file  Social Connections: Not on file  Intimate Partner Violence: Not on file    FAMILY HISTORY:  Family History  Problem Relation Age of Onset   Stroke Mother    Hypertension Mother    Colon cancer Sister        in her 26s   Breast cancer Sister    Heart attack Brother    COPD Maternal Aunt    COPD Sister    Hypertension Sister    Pneumonia Sister    COPD Son    Cancer Neg Hx     CURRENT MEDICATIONS:  Current Outpatient Medications  Medication Sig Dispense Refill   albuterol (VENTOLIN HFA) 108 (90 Base) MCG/ACT inhaler Inhale 2 puffs into the lungs every 6 (six) hours as needed for wheezing or shortness of breath. 6.7 g 5   amLODipine (NORVASC) 2.5 MG tablet Take 1 tablet (2.5 mg total) by mouth daily. 90 tablet 3   amoxicillin-clavulanate (AUGMENTIN) 875-125 MG tablet Take 1 tablet by mouth every 12 (twelve) hours for 6 days. 12 tablet 0   doxycycline (VIBRAMYCIN) 100 MG capsule Take 1 capsule (100 mg total) by mouth 2 (two) times daily for 6 days. 12 capsule 0   escitalopram (LEXAPRO) 10 MG tablet Take 10 mg by mouth daily.     Melatonin 10 MG CAPS Take 10 mg by mouth at bedtime.      metoprolol succinate (TOPROL XL) 25 MG 24 hr tablet Take 1 tablet (25 mg total) by mouth daily. 30 tablet 11   Multiple Vitamin (MULTIVITAMIN ADULT) TABS Take 1 tablet by mouth daily.     omeprazole (PRILOSEC) 20 MG capsule Take 20 mg by mouth daily.     rivaroxaban (XARELTO) 20 MG TABS tablet Take 1 tablet (20 mg total) by mouth daily with supper. 90 tablet 4   simvastatin (ZOCOR) 40 MG tablet Take 40 mg by mouth at bedtime.      solifenacin (VESICARE) 10 MG tablet Take 10 mg by mouth daily.      tamsulosin (FLOMAX) 0.4 MG CAPS capsule Take 0.4 mg by mouth daily.     No current facility-administered medications  for this visit.    ALLERGIES:  No Known Allergies  PHYSICAL EXAM:  Performance status (ECOG): 1 - Symptomatic but completely ambulatory  Vitals:   07/19/21 1201  BP: 119/69  Pulse: 73  Resp: 16  Temp: (!) 96.2 F (35.7 C)  SpO2: 97%   Wt Readings from Last 3 Encounters:  07/19/21 230 lb 6.4 oz (104.5 kg)  07/18/21 228 lb (103.4 kg)  03/28/21 229 lb (103.9 kg)   Physical Exam Vitals reviewed.  Constitutional:      Appearance: Normal appearance.  Cardiovascular:     Rate and Rhythm: Normal rate and regular rhythm.     Pulses: Normal pulses.     Heart sounds: Normal heart sounds.  Pulmonary:     Effort: Pulmonary effort is normal.     Breath sounds: Normal breath sounds.  Neurological:     General: No focal deficit present.     Mental Status: He is alert and oriented to person, place, and time.  Psychiatric:        Mood and Affect: Mood normal.        Behavior: Behavior normal.     LABORATORY DATA:  I have reviewed the labs as listed.  CBC Latest Ref Rng & Units 07/18/2021 05/23/2021 03/28/2021  WBC 4.0 - 10.5 K/uL 11.5(H) 7.8 6.4  Hemoglobin 13.0 - 17.0 g/dL 15.9 16.1 15.3  Hematocrit 39.0 - 52.0 % 47.9 49.2 47.1  Platelets 150 - 400 K/uL 134(L) 149(L) 156   CMP Latest Ref Rng & Units 07/18/2021 05/23/2021 03/28/2021  Glucose 70 - 99 mg/dL 142(H) 99 97  BUN 8 - 23 mg/dL 14 14 16   Creatinine 0.61 - 1.24 mg/dL 1.16 1.17 1.07  Sodium 135 - 145 mmol/L 135 138 139  Potassium 3.5 - 5.1 mmol/L 4.3 4.7 5.2(H)  Chloride 98 - 111 mmol/L 103 102 106  CO2 22 - 32 mmol/L 25 28 27   Calcium 8.9 - 10.3 mg/dL 8.7(L) 9.5 9.3  Total Protein 6.5 - 8.1 g/dL 7.1 7.2 7.0  Total Bilirubin 0.3 - 1.2 mg/dL 1.6(H) 0.7 1.5(H)  Alkaline Phos 38 - 126 U/L 67 78 64  AST 15 - 41 U/L 54(H) 70(H) 65(H)  ALT 0 - 44 U/L 56(H) 73(H) 59(H)    DIAGNOSTIC IMAGING:  I have independently reviewed the scans and discussed with the patient. CT Angio Chest PE W and/or Wo Contrast  Result Date:  07/18/2021 CLINICAL DATA:  Chest pain and hemoptysis. EXAM: CT ANGIOGRAPHY CHEST WITH CONTRAST TECHNIQUE: Multidetector CT imaging of the chest was performed using the standard protocol during bolus administration of intravenous contrast. Multiplanar CT image reconstructions and MIPs were obtained to evaluate the vascular anatomy. CONTRAST:  128mL OMNIPAQUE IOHEXOL 350 MG/ML SOLN COMPARISON:  CT chest dated May 23, 2021. FINDINGS: Cardiovascular: Satisfactory opacification of the pulmonary arteries to the segmental level. No evidence of pulmonary embolism. Unchanged mild cardiomegaly. No pericardial effusion. Unchanged aneurysmal dilatation of ascending thoracic aorta measuring up to 4.3 cm. Coronary, aortic arch, and branch vessel atherosclerotic vascular disease. Mediastinum/Nodes: No enlarged mediastinal, hilar, or axillary lymph nodes. Thyroid gland, trachea, and esophagus demonstrate no significant findings. Lungs/Pleura: Moderate centrilobular and paraseptal emphysema again noted. New patchy consolidation in the left upper lobe. No pleural effusion or pneumothorax. Plaque-like peripheral left upper lobe lesion is somewhat obscured by new consolidation but is grossly unchanged. Upper Abdomen: No acute abnormality. Musculoskeletal: No chest wall abnormality. No acute or significant osseous findings. Unchanged epidermal inclusion cyst in the anterior left chest wall. Review of the MIP images confirms the above findings. IMPRESSION: 1. No evidence of pulmonary embolism. 2. New patchy consolidation in the left upper lobe, consistent with pneumonia. 3. Plaque-like peripheral left upper lobe lesion is somewhat obscured by new consolidation but is grossly unchanged. 4. Unchanged aneurysmal dilatation of the ascending thoracic aorta measuring up to 4.3 cm. Recommend annual imaging followup by CTA or MRA. This recommendation follows 2010 ACCF/AHA/AATS/ACR/ASA/SCA/SCAI/SIR/STS/SVM Guidelines for the Diagnosis and  Management of Patients with Thoracic  Aortic Disease. Circulation. 2010; 121: Y101-B510. Aortic aneurysm NOS (ICD10-I71.9) 5. 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 Electronically Signed   By: Titus Dubin M.D.   On: 07/18/2021 11:17   DG Chest Port 1 View  Result Date: 07/18/2021 CLINICAL DATA:  Chest pain. EXAM: PORTABLE CHEST 1 VIEW COMPARISON:  July 01, 2020. FINDINGS: Stable cardiomediastinal silhouette. New peripheral left upper lobe airspace opacity is noted most consistent with pneumonia. Minimal reticular densities are noted throughout both lungs which may represent scarring. Bony thorax is unremarkable. IMPRESSION: New left upper lobe airspace opacity is noted most consistent with pneumonia. Followup PA and lateral chest X-ray is recommended in 3-4 weeks following trial of antibiotic therapy to ensure resolution and exclude underlying malignancy. Aortic Atherosclerosis (ICD10-I70.0). Electronically Signed   By: Marijo Conception M.D.   On: 07/18/2021 10:14     ASSESSMENT:  1.  Left upper lobe lung mass: -Recent history of pneumonia followed by abnormal chest x-ray findings. -CT of the chest without contrast on 06/03/2020 showed 2.3 x 2.2 x 4.4 cm subpleural mass like consolidation in the left upper lobe corresponding to the density seen on the prior radiograph. -Patient smoked for 33 years, quit smoking at age 52.  He smoked on average of 1 pack/day for most years and at the time of quitting, was smoking 3 packs/day of cigarettes. 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  He reports asbestos exposure throughout his career. 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 -PET scan on 06/16/2020 shows posterior left upper lobe pleural-based hypermetabolic nodule, 2.4 x 2.3 cm SUV 6.2.  AP window lymph node measures 6 mm with SUV 4.3.  No hypermetabolic extrathoracic  metastatic disease. -MRI of the brain on 06/22/2020 shows single suspicious focus of enhancement along the inferolateral margin of the temporal lobe on the left measuring 4 mm, not well seen on sagittal or coronal postcontrast imaging.  Unsure if it is small metastasis or a surface vessel.  No second suspicious focus identified.  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 -Left upper lobe needle biopsy on 07/01/2020 shows lung tissue with a lymphoplasmacytic infiltrate and organizing pneumonia type changes and fibrosis.  IHC negative for tumor. -PET scan on 08/16/2020 shows subpleural left upper lobe lung nodule measuring 2.2 x 1.2 cm, previously 2.8 x 2.2 cm.  SUV 3.4, previously 6.2.  Previously described hypermetabolic AP window node has resolved. - CT chest on 11/18/2020 showed further signs of regression of the left upper lobe mass.   2.  Ascending aortic aneurysm: -4.3 cm aneurysmal dilatation of the ascending aorta was noted on the current CT scan. -Annual imaging by CTA or MRA was recommended.   PLAN:  1.  Left upper lobe lung mass: - He reportedly developed cough and hemoptysis a day ago and was evaluated in the ER on 07/18/2021. - I reviewed CT scan of the chest PE protocol which was negative for pulmonary embolism.  New patchy consolidation in the left upper lobe consistent with pneumonia.  Left upper lobe plaque-like peripheral lesion is somewhat obscured by the pneumonia. - However I have reviewed CT chest with contrast from 05/23/2021 which showed continued further decrease in size of the peripheral left upper lobe lung lesion now with only residual subpleural plaque-like appearance. - I have recommended follow-up in 6 months with repeat CT scan of the chest. -  He has elevated AST and ALT past few times.  His bilirubin is also elevated at 1.6.  Likely etiology is fatty liver.  Will make a referral to GI for further work-up.   2.  COPD: - Continue  albuterol as needed.   3.  Sleep apnea: - Continue to use CPAP.   4.  Atrial fibrillation: - He was on Xarelto which was held yesterday secondary to hemoptysis. - He will start back once he finishes treatment for his pneumonia and hemoptysis cleared.   Orders placed this encounter:  No orders of the defined types were placed in this encounter.    Derek Jack, MD [place] [phone]   I, Thana Ates, am acting as a scribe for Dr. Derek Jack.  I, Derek Jack MD, have reviewed the above documentation for accuracy and completeness, and I agree with the above.

## 2021-07-18 NOTE — ED Notes (Signed)
Patient transported to CT 

## 2021-07-18 NOTE — ED Triage Notes (Signed)
Pt c/o cp and spitting up blood that started in the middle of the night.

## 2021-07-18 NOTE — Discharge Instructions (Addendum)
I suspect that the coughing up blood is from the pneumonia that you have in your left upper lung.  I have started you on antibiotics please take as prescribed.  Please start this medication tomorrow as you have already gotten your first dosage here today.  Please stop taking your Xarelto, you may restart this after you finish your antibiotics.  Please follow-up with Dr. Raliegh Ip tomorrow for further evaluation.  Come back to the emergency department if you develop chest pain, shortness of breath, severe abdominal pain, uncontrolled nausea, vomiting, diarrhea.

## 2021-07-18 NOTE — ED Notes (Signed)
Pt returned from CT °

## 2021-07-18 NOTE — ED Provider Notes (Signed)
[facility] EMERGENCY DEPARTMENT Provider Note   CSN: [id number] Arrival date & time: 07/18/21  1751     History Chief Complaint  Patient presents with   Chest Pain    Jonathan Greene is a 80 y.o. male.  HPI  Patient with symptom medical history of COPD, A. 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  Patient states last night he woke up on the middle of the sleep due to a cough, states the cough is persistent, states he was coughing up clots of blood, states he then developed some chest pain with on the left side of his chest, does not radiate, was not associated with diaphoresis, nausea, vomiting, lightheadedness, dizziness, orthopnea, worsening pedal edema, no history of PEs or DVTs, currently not on hormone therapy, states he has not missing any doses of his Xarelto.  left side lung mass followed by Dr. Delton Coombes, it was biopsied and was not found to be a tumor and is shrinking in size.  He denies  fevers, nasal congestion, sore throat, general body aches, stomach pain, nausea, vomiting, diarrhea, states he is vaccines against COVID-19, denies recent sick contacts.  Patient currently has no chest pain at this time, states he feels slightly more short of breath than usual, has no other complaints at this time.  Past Medical History:  Diagnosis Date   A-fib (HCC)    Bradycardia    COPD (chronic obstructive pulmonary disease) (HCC)    Frequent urination    GERD (gastroesophageal reflux disease)    Hypercholesterolemia    Hypertension    MI (myocardial infarction) ([place])    Prostate cancer ([place])    RADIATION TX AND SCHEDULED FOR SEED IMPLANTS 02-28-12   Sleep apnea    Stroke ([place])    2011;ST memory loss, no other deficits.   Urinary urgency     Patient Active Problem List   Diagnosis Date Noted   Mass of upper lobe of left lung 06/08/2020   Diarrhea 12/31/2019    Melena 12/31/2019   Essential hypertension 12/11/2016   Bradycardia 12/11/2016   Emphysema of lung ([place]) 12/11/2016   A-fib ([place]) 02/11/2015   Hematochezia 08/04/2014   Prostate cancer ([place]) 12/07/2011    Past Surgical History:  Procedure Laterality Date   CATARACT EXTRACTION W/PHACO Right 11/06/2013   Procedure: CATARACT EXTRACTION PHACO AND INTRAOCULAR LENS PLACEMENT (IOC);  Surgeon: Tonny Branch, MD;  Location: [facility];  Service: Ophthalmology;  Laterality: Right;  CDE 11.67   CATARACT EXTRACTION W/PHACO Left 11/25/2015   Procedure: CATARACT EXTRACTION PHACO AND INTRAOCULAR LENS PLACEMENT ; CDE:  8.15;  Surgeon: Tonny Branch, MD;  Location: [facility];  Service: Ophthalmology;  Laterality: Left;   COLONOSCOPY  10/03/2006   [facility]:ENIDPOEUMP rectal polyps cold biopsied/removed, otherwise normal rectum/Sigmoid diverticula.  Remainder of colon mucosa appeared normal   COLONOSCOPY N/A 08/19/2014   RMR: Radiation proctitis-status post APC ablation. Colonic diverticulosis.   CYSTOSCOPY  02/28/2012   Procedure: CYSTOSCOPY;  Surgeon: Bernestine Amass, MD;  Location: [facility];  Service: Urology;  Laterality: N/A;   no seeds noted in bladder   RADIOACTIVE SEED IMPLANT  02/28/2012   Procedure: RADIOACTIVE SEED IMPLANT;  Surgeon: Bernestine Amass, MD;  Location: [facility];  Service: Urology;  Laterality: N/A;  52seeds implanted        Family History  Problem Relation Age of Onset   Stroke Mother  Hypertension Mother    Colon cancer Sister        in her 67s   Breast cancer Sister    Heart attack Brother    COPD Maternal Aunt    COPD Sister    Hypertension Sister    Pneumonia Sister    COPD Son    Cancer Neg Hx     Social History   Tobacco Use   Smoking status: Former    Packs/day: 2.00    Years: 35.00    Pack years: 70.00    Types: Cigarettes    Quit date: 02/21/1991    Years since quitting: 30.4   Smokeless tobacco: Current    Types: Chew   Tobacco  comments:    CHEW TOBACCO FOR 20 YRS   Vaping Use   Vaping Use: Never used  Substance Use Topics   Alcohol use: Yes    Alcohol/week: 14.0 standard drinks    Types: 14 Shots of liquor per week    Comment: 4 ounces daily   Drug use: No    Home Medications Prior to Admission medications   Medication Sig Start Date End Date Taking? Authorizing Provider  albuterol (VENTOLIN HFA) 108 (90 Base) MCG/ACT inhaler Inhale 2 puffs into the lungs every 6 (six) hours as needed for wheezing or shortness of breath. 06/09/20  Yes Chesley Mires, MD  amLODipine (NORVASC) 2.5 MG tablet Take 1 tablet (2.5 mg total) by mouth daily. 11/02/20  Yes Strader, Fransisco Hertz, PA-C  amoxicillin-clavulanate (AUGMENTIN) 875-125 MG tablet Take 1 tablet by mouth every 12 (twelve) hours for 6 days. 07/18/21 07/24/21 Yes Marcello Fennel, PA-C  doxycycline (VIBRAMYCIN) 100 MG capsule Take 1 capsule (100 mg total) by mouth 2 (two) times daily for 6 days. 07/18/21 07/24/21 Yes Marcello Fennel, PA-C  escitalopram (LEXAPRO) 10 MG tablet Take 10 mg by mouth daily.   Yes [provider]  Melatonin 10 MG CAPS Take 10 mg by mouth at bedtime.    Yes [provider]  metoprolol succinate (TOPROL XL) 25 MG 24 hr tablet Take 1 tablet (25 mg total) by mouth daily. 07/29/20  Yes Strader, [place] M, PA-C  Multiple Vitamin (MULTIVITAMIN ADULT) TABS Take 1 tablet by mouth daily.   Yes [provider]  omeprazole (PRILOSEC) 20 MG capsule Take 20 mg by mouth daily.   Yes [provider]  rivaroxaban (XARELTO) 20 MG TABS tablet Take 1 tablet (20 mg total) by mouth daily with supper. 03/16/15  Yes BranchAlphonse Guild, MD  simvastatin (ZOCOR) 40 MG tablet Take 40 mg by mouth at bedtime.  12/15/14  Yes [provider]  solifenacin (VESICARE) 10 MG tablet Take 10 mg by mouth daily.  02/05/19  Yes [provider]  tamsulosin (FLOMAX) 0.4 MG CAPS capsule Take 0.4 mg by mouth daily.   Yes [provider]    Allergies    Patient has no known allergies.  Review of Systems   Review of Systems  Constitutional:  Negative for chills and fever.  HENT:  Negative for congestion.   Respiratory:  Positive for cough and shortness of breath.   Cardiovascular:  Positive for chest pain. Negative for palpitations and leg swelling.  Gastrointestinal:  Negative for abdominal pain, diarrhea, nausea and vomiting.  Genitourinary:  Negative for enuresis.  Musculoskeletal:  Negative for back pain.  Skin:  Negative for rash.  Neurological:  Negative for dizziness.  Hematological:  Does not bruise/bleed easily.   Physical Exam Updated  Vital Signs BP 113/79   Pulse 83   Temp 98.6 F (37 C) (Oral)   Resp (!) 25   Ht 6\' 3"  (1.905 m)   Wt 103.4 kg   SpO2 93%   BMI 28.50 kg/m   Physical Exam Vitals and nursing note reviewed.  Constitutional:      General: He is not in acute distress.    Appearance: He is not ill-appearing.  HENT:     Head: Normocephalic and atraumatic.     Nose: No congestion.  Eyes:     Conjunctiva/sclera: Conjunctivae normal.  Cardiovascular:     Rate and Rhythm: Normal rate. Rhythm irregular.     Pulses: Normal pulses.     Heart sounds: No murmur heard.   No friction rub. No gallop.  Pulmonary:     Effort: No respiratory distress.     Breath sounds: No wheezing, rhonchi or rales.  Musculoskeletal:     Right lower leg: No edema.     Left lower leg: No edema.  Skin:    General: Skin is warm and dry.  Neurological:     Mental Status: He is alert.  Psychiatric:        Mood and Affect: Mood normal.    ED Results / Procedures / Treatments   Labs (all labs ordered are listed, but only abnormal results are displayed) Labs Reviewed  COMPREHENSIVE METABOLIC PANEL - Abnormal; Notable for the following components:      Result Value   Glucose, Bld 142 (*)    Calcium 8.7 (*)    AST 54 (*)    ALT 56 (*)    Total Bilirubin 1.6 (*)    All other components  within normal limits  CBC WITH DIFFERENTIAL/PLATELET - Abnormal; Notable for the following components:   WBC 11.5 (*)    Platelets 134 (*)    Neutro Abs 9.8 (*)    All other components within normal limits  PROTIME-INR - Abnormal; Notable for the following components:   Prothrombin Time 24.5 (*)    INR 2.2 (*)    All other components within normal limits  RESP PANEL BY RT-PCR (FLU A&B, COVID) ARPGX2  TROPONIN I (HIGH SENSITIVITY)  TROPONIN I (HIGH SENSITIVITY)    EKG None  Radiology CT Angio Chest PE W and/or Wo Contrast  Result Date: 07/18/2021 CLINICAL DATA:  Chest pain and hemoptysis. EXAM: CT ANGIOGRAPHY CHEST WITH CONTRAST TECHNIQUE: Multidetector CT imaging of the chest was performed using the standard protocol during bolus administration of intravenous contrast. Multiplanar CT image reconstructions and MIPs were obtained to evaluate the vascular anatomy. CONTRAST:  117mL OMNIPAQUE IOHEXOL 350 MG/ML SOLN COMPARISON:  CT chest dated May 23, 2021. FINDINGS: Cardiovascular: Satisfactory opacification of the pulmonary arteries to the segmental level. No evidence of pulmonary embolism. Unchanged mild cardiomegaly. No pericardial effusion. Unchanged aneurysmal dilatation of ascending thoracic aorta measuring up to 4.3 cm. Coronary, aortic arch, and branch vessel atherosclerotic vascular disease. Mediastinum/Nodes: No enlarged mediastinal, hilar, or axillary lymph nodes. Thyroid gland, trachea, and esophagus demonstrate no significant findings. Lungs/Pleura: Moderate centrilobular and paraseptal emphysema again noted. New patchy consolidation in the left upper lobe. No pleural effusion or pneumothorax. Plaque-like peripheral left upper lobe lesion is somewhat obscured by new consolidation but is grossly unchanged. Upper Abdomen: No acute abnormality. Musculoskeletal: No chest wall abnormality. No acute or significant osseous findings. Unchanged epidermal inclusion cyst in the anterior left chest  wall. Review of the MIP images confirms the above findings. IMPRESSION: 1.  No evidence of pulmonary embolism. 2. New patchy consolidation in the left upper lobe, consistent with pneumonia. 3. Plaque-like peripheral left upper lobe lesion is somewhat obscured by new consolidation but is grossly unchanged. 4. Unchanged aneurysmal dilatation of the ascending thoracic aorta measuring up to 4.3 cm. Recommend annual imaging followup by CTA or MRA. This recommendation follows 2010 ACCF/AHA/AATS/ACR/ASA/SCA/SCAI/SIR/STS/SVM Guidelines for the Diagnosis and Management of Patients with Thoracic Aortic Disease. Circulation. 2010; 121: E720-N470. Aortic aneurysm NOS (ICD10-I71.9) 5. 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 Electronically Signed   By: Titus Dubin M.D.   On: 07/18/2021 11:17   DG Chest Port 1 View  Result Date: 07/18/2021 CLINICAL DATA:  Chest pain. EXAM: PORTABLE CHEST 1 VIEW COMPARISON:  July 01, 2020. FINDINGS: Stable cardiomediastinal silhouette. New peripheral left upper lobe airspace opacity is noted most consistent with pneumonia. Minimal reticular densities are noted throughout both lungs which may represent scarring. Bony thorax is unremarkable. IMPRESSION: New left upper lobe airspace opacity is noted most consistent with pneumonia. Followup PA and lateral chest X-ray is recommended in 3-4 weeks following trial of antibiotic therapy to ensure resolution and exclude underlying malignancy. Aortic Atherosclerosis (ICD10-I70.0). Electronically Signed   By: Marijo Conception M.D.   On: 07/18/2021 10:14    Procedures Procedures   Medications Ordered in ED Medications  lidocaine (PF) (XYLOCAINE) 1 % injection (has no administration in time range)  iohexol (OMNIPAQUE) 350 MG/ML injection 100 mL (100 mLs Intravenous Contrast Given 07/18/21 1056)  cefTRIAXone (ROCEPHIN) injection 1 g (1 g Intramuscular Given 07/18/21 1320)  doxycycline (VIBRA-TABS) tablet 100 mg (100 mg Oral  Given 07/18/21 1320)    ED Course  I have reviewed the triage vital signs and the nursing notes.  Pertinent labs & imaging results that were available during my care of the patient were reviewed by me and considered in my medical decision making (see chart for details).    MDM Rules/Calculators/A&P                          Initial impression-presents with hemoptysis and chest pain, he is alert, does not appear acute stress, vital signs are reassuring.  Hemoptysis with subsequent chest pain concerning for possible PE, malignancy, pneumonia will obtain CTA of chest chest pain work-up and continue to evaluate.  Work-up-CBC shows slight leukocytosis of 11.5, CMP shows slight hyperglycemia 142, slightly elevated liver enzymes and T bili.  Prothrombin time elevated at 24.5, INR 2.2, respiratory panel negative, first troponin is 3.  Chest x-ray reveals new left upper lobe airspace opacity noted more consistent with pneumonia, CTA of chest reveals new patchy consolidation in the left upper lobe consistent with pneumonia, plaque-like peripheral left upper lobe lesion appears grossly unchanged.  Dilated ascending thoracic aorta aneurysm measuring 4.3 cm follow-up in years time.  Reassessment-patient's imaging was reviewed, I suspect hemoptysis secondary due to pneumonia.  will give him a dose of antibiotics here in the emergency department.  Continue to trend troponins.   Patient has a negative delta troponin, states he has no complaints this time.  Due to the persistent hemoptysis will speak with oncology for further recommendations.  Updated patient on these recommendations, he is in agreement this plan.   Consult spoke with Dr. Delton Coombes he recommends withholding the Xarelto for a week's time and then putting him back on once the pneumonia has resolved.  Rule out- I have low suspicion for ACS as history is atypical, EKG was sinus rhythm  without signs of ischemia, patient had a delta troponin.  Low  suspicion for PE CT of chest is negative.  Low suspicion for AAA or aortic dissection as history is atypical, imaging was negative for acute findings.  Low suspicion for systemic infection as patient is nontoxic-appearing, vital signs reassuring, no obvious source infection noted on exam.   Plan-  Hemodialysis and chest pain-I suspect likely due to left upper lobe pneumonia, per oncology recommendations will discontinue Xarelto have him continue with antibiotics and can restart after his pneumonia has resolved.  He will follow-up with oncology tomorrow for further evaluation.  Vital signs have remained stable, no indication for hospital admission.  Patient discussed with attending and they agreed with assessment and plan.  Patient given at home care as well strict return precautions.  Patient verbalized that they understood agreed to said plan.  Final Clinical Impression(s) / ED Diagnoses Final diagnoses:  Community acquired pneumonia of left upper lobe of lung  Hemoptysis    Rx / DC Orders ED Discharge Orders          Ordered    amoxicillin-clavulanate (AUGMENTIN) 875-125 MG tablet  Every 12 hours        07/18/21 1446    doxycycline (VIBRAMYCIN) 100 MG capsule  2 times daily        07/18/21 1446             Aron Baba 07/18/21 1449    Milton Ferguson, MD 07/20/21 [phone]

## 2021-07-19 ENCOUNTER — Inpatient Hospital Stay (HOSPITAL_COMMUNITY): Payer: Medicare Other | Attending: Hematology | Admitting: Hematology

## 2021-07-19 ENCOUNTER — Encounter: Payer: Self-pay | Admitting: Internal Medicine

## 2021-07-19 ENCOUNTER — Other Ambulatory Visit: Payer: Self-pay

## 2021-07-19 VITALS — BP 119/69 | HR 73 | Temp 96.2°F | Resp 16 | Wt 230.4 lb

## 2021-07-19 DIAGNOSIS — I252 Old myocardial infarction: Secondary | ICD-10-CM | POA: Diagnosis not present

## 2021-07-19 DIAGNOSIS — R918 Other nonspecific abnormal finding of lung field: Secondary | ICD-10-CM | POA: Diagnosis not present

## 2021-07-19 DIAGNOSIS — E7800 Pure hypercholesterolemia, unspecified: Secondary | ICD-10-CM | POA: Diagnosis not present

## 2021-07-19 DIAGNOSIS — K219 Gastro-esophageal reflux disease without esophagitis: Secondary | ICD-10-CM | POA: Diagnosis not present

## 2021-07-19 DIAGNOSIS — Z8546 Personal history of malignant neoplasm of prostate: Secondary | ICD-10-CM | POA: Diagnosis not present

## 2021-07-19 DIAGNOSIS — Z8673 Personal history of transient ischemic attack (TIA), and cerebral infarction without residual deficits: Secondary | ICD-10-CM | POA: Insufficient documentation

## 2021-07-19 DIAGNOSIS — D381 Neoplasm of uncertain behavior of trachea, bronchus and lung: Secondary | ICD-10-CM | POA: Diagnosis present

## 2021-07-19 DIAGNOSIS — J189 Pneumonia, unspecified organism: Secondary | ICD-10-CM | POA: Diagnosis not present

## 2021-07-19 DIAGNOSIS — I4891 Unspecified atrial fibrillation: Secondary | ICD-10-CM | POA: Diagnosis not present

## 2021-07-19 DIAGNOSIS — R5383 Other fatigue: Secondary | ICD-10-CM | POA: Diagnosis not present

## 2021-07-19 DIAGNOSIS — Z7901 Long term (current) use of anticoagulants: Secondary | ICD-10-CM | POA: Diagnosis not present

## 2021-07-19 DIAGNOSIS — I10 Essential (primary) hypertension: Secondary | ICD-10-CM | POA: Insufficient documentation

## 2021-07-19 DIAGNOSIS — Z79899 Other long term (current) drug therapy: Secondary | ICD-10-CM | POA: Diagnosis not present

## 2021-07-19 DIAGNOSIS — Z77090 Contact with and (suspected) exposure to asbestos: Secondary | ICD-10-CM | POA: Diagnosis not present

## 2021-07-19 DIAGNOSIS — J61 Pneumoconiosis due to asbestos and other mineral fibers: Secondary | ICD-10-CM | POA: Insufficient documentation

## 2021-07-19 DIAGNOSIS — I712 Thoracic aortic aneurysm, without rupture: Secondary | ICD-10-CM | POA: Insufficient documentation

## 2021-07-19 DIAGNOSIS — J449 Chronic obstructive pulmonary disease, unspecified: Secondary | ICD-10-CM | POA: Diagnosis not present

## 2021-07-19 DIAGNOSIS — Z87891 Personal history of nicotine dependence: Secondary | ICD-10-CM | POA: Diagnosis not present

## 2021-07-19 DIAGNOSIS — G4730 Sleep apnea, unspecified: Secondary | ICD-10-CM | POA: Insufficient documentation

## 2021-07-19 DIAGNOSIS — R7401 Elevation of levels of liver transaminase levels: Secondary | ICD-10-CM | POA: Diagnosis not present

## 2021-07-19 NOTE — Patient Instructions (Addendum)
[place] at [facility] Discharge Instructions  You were seen today by Dr. Delton Coombes. He went over your recent results and scans. You will be scheduled for a CT scan of your chest prior to your next appointment. Dr. Delton Coombes will see you back in 6 months for labs and follow up.   Thank you for choosing [place] at [facility] to provide your oncology and hematology care.  To afford each patient quality time with our provider, please arrive at least 15 minutes before your scheduled appointment time.   If you have a lab appointment with the [facility] please come in thru the Main Entrance and check in at the main information desk  You need to re-schedule your appointment should you arrive 10 or more minutes late.  We strive to give you quality time with our providers, and arriving late affects you and other patients whose appointments are after yours.  Also, if you no show three or more times for appointments you may be dismissed from the clinic at the providers discretion.     Again, thank you for choosing [facility].  Our hope is that these requests will decrease the amount of time that you wait before being seen by our physicians.       _____________________________________________________________  Should you have questions after your visit to [facility], please contact our office at [phone] between the hours of 8:00 a.m. and 4:30 p.m.  Voicemails left after 4:00 p.m. will not be returned until the following business day.  For prescription refill requests, have your pharmacy contact our office and allow 72 hours.    Cancer Center Support Programs:   > Cancer Support Group  2nd Tuesday of the month 1pm-2pm, Journey Room

## 2021-08-05 ENCOUNTER — Other Ambulatory Visit (HOSPITAL_COMMUNITY): Payer: Self-pay | Admitting: Adult Health Nurse Practitioner

## 2021-08-05 ENCOUNTER — Ambulatory Visit (HOSPITAL_COMMUNITY)
Admission: RE | Admit: 2021-08-05 | Discharge: 2021-08-05 | Disposition: A | Discharge status: Home / Self Care | Payer: Medicare Other | Source: Ambulatory Visit | Attending: Adult Health Nurse Practitioner | Admitting: Adult Health Nurse Practitioner

## 2021-08-05 ENCOUNTER — Other Ambulatory Visit: Payer: Self-pay

## 2021-08-05 DIAGNOSIS — J159 Unspecified bacterial pneumonia: Secondary | ICD-10-CM

## 2021-08-18 ENCOUNTER — Ambulatory Visit: Payer: Medicare Other | Admitting: Cardiology

## 2021-09-16 ENCOUNTER — Ambulatory Visit (HOSPITAL_COMMUNITY)
Admission: RE | Admit: 2021-09-16 | Discharge: 2021-09-16 | Disposition: A | Discharge status: Home / Self Care | Payer: Medicare Other | Source: Ambulatory Visit | Attending: Adult Health Nurse Practitioner | Admitting: Adult Health Nurse Practitioner

## 2021-09-16 ENCOUNTER — Other Ambulatory Visit (HOSPITAL_COMMUNITY): Payer: Self-pay | Admitting: Adult Health Nurse Practitioner

## 2021-09-16 ENCOUNTER — Other Ambulatory Visit: Payer: Self-pay

## 2021-09-16 DIAGNOSIS — M545 Low back pain, unspecified: Secondary | ICD-10-CM | POA: Insufficient documentation

## 2021-09-16 DIAGNOSIS — R7303 Prediabetes: Secondary | ICD-10-CM | POA: Insufficient documentation

## 2021-09-16 DIAGNOSIS — I700 Atherosclerosis of aorta: Secondary | ICD-10-CM | POA: Insufficient documentation

## 2021-09-20 ENCOUNTER — Telehealth: Payer: Self-pay

## 2021-09-20 NOTE — Telephone Encounter (Signed)
LVM for pt to bring in SD on 09/22/21.

## 2021-09-22 ENCOUNTER — Other Ambulatory Visit: Payer: Self-pay

## 2021-09-22 ENCOUNTER — Ambulatory Visit: Payer: Medicare Other | Admitting: Pulmonary Disease

## 2021-09-22 ENCOUNTER — Encounter: Payer: Self-pay | Admitting: Pulmonary Disease

## 2021-09-22 VITALS — BP 136/80 | HR 82 | Temp 98.1°F | Ht 75.0 in | Wt 234.8 lb

## 2021-09-22 DIAGNOSIS — J432 Centrilobular emphysema: Secondary | ICD-10-CM

## 2021-09-22 DIAGNOSIS — G4733 Obstructive sleep apnea (adult) (pediatric): Secondary | ICD-10-CM | POA: Diagnosis not present

## 2021-09-22 DIAGNOSIS — R911 Solitary pulmonary nodule: Secondary | ICD-10-CM | POA: Diagnosis not present

## 2021-09-22 DIAGNOSIS — R918 Other nonspecific abnormal finding of lung field: Secondary | ICD-10-CM

## 2021-09-22 DIAGNOSIS — Z77090 Contact with and (suspected) exposure to asbestos: Secondary | ICD-10-CM | POA: Diagnosis not present

## 2021-09-22 DIAGNOSIS — Z9989 Dependence on other enabling machines and devices: Secondary | ICD-10-CM

## 2021-09-22 NOTE — Progress Notes (Signed)
[facility]  Chief Complaint  Patient presents with   Follow-up    Pt states SOB and dizziness     Constitutional:  BP 136/80 (BP Location: Right Arm, Patient Position: Sitting, Cuff Size: Normal)   Pulse 82   Temp 98.1 F (36.7 C) (Oral)   Ht 6\' 3"  (1.905 m)   Wt 234 lb 12.8 oz (106.5 kg)   SpO2 95%   BMI 29.35 kg/m   Past Medical History:  A fib, GERD, HLD, HTN, Prostate cancer  Past Surgical History:  His  has a past surgical history that includes Radioactive seed implant (02/28/2012); Cystoscopy (02/28/2012); Cataract extraction w/PHACO (Right, 11/06/2013); Colonoscopy (10/03/2006); Colonoscopy (N/A, 08/19/2014); and Cataract extraction w/PHACO (Left, 11/25/2015).  Brief Summary:  Jonathan Greene is a 80 y.o. 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      Subjective:   He was having more trouble with his breathing.  Saw his PCP and got started on breztri.  This has helped.  Gets intermittent episodes of dizziness.  He checks his SpO2 at home and never below 95%.  He had CT chest 07/18/21.    Physical Exam:   Appearance - well kempt   ENMT - no sinus tenderness, no oral exudate, no LAN, Mallampati 3 airway, no stridor  Respiratory - equal breath sounds bilaterally, no wheezing or rales  CV - s1s2 regular rate and rhythm, no murmurs  Ext - no clubbing, no edema  Skin - no rashes  Psych - normal mood and affect   Pulmonary testing:  PFT 06/30/20 >> FEV1 3.50 (99%), FEV1% 77, TLC 7.09 (90%), DLCO 51% Lt upper lobe core needle biopsy 07/01/20 >> benign lung with inflammation and fibrosis  Chest Imaging:  CT chest 06/03/20 >> atherosclerosis, aortic root 4.3 cm, severe centrilobular emphysema, LUL subpleural mass like consolidation 4.4 x 2.3 x 2.2 cm  PET scan 06/16/20 >> 2.4 x 2.3 cm pleural based LUL nodule 6.2 SUV, 6 mm AP window node  4.2 SUV, ascending aorta 4.1 cm, centrilobular and paraseptal emphysema PET scan 08/16/20 >> decreased size and SUV of LUL 2.2 x 1.2 cm mass, AP window LAN resolved CT chest 07/18/21 >> ascending aorta 4.3 cm, atherosclerosis, mod emphysema, new LUL patchy consolidation, plaque like lesion LUL  Sleep Tests:  Auto CPAP 08/15/20 to 09/13/20 >> used on 30 of 30 nights with average 7 hrs 46 min.  Average AHI 4.2 with median CPAP 6 and 95 th percentile CPAP 9 cm H2O  Cardiac Tests:  Echo 06/30/20 >> EF 55 to 60%, mod LVH  Social History:  He  reports that he quit smoking about 30 years ago. His smoking use included cigarettes. He has a 70.00 pack-year smoking history. His smokeless tobacco use includes chew. He reports current alcohol use of about 14.0 standard drinks per week. He reports that he does not use drugs.  Family History:  His family history includes Breast cancer in his sister; COPD in his maternal aunt, sister, and son; Colon cancer in his sister; Heart attack in his brother; Hypertension in his mother and sister; Pneumonia in his sister; Stroke in his mother.     Assessment/Plan:   Lt upper lobe mass like consolidation seen on CT chest 06/03/20. - biopsy from July 2021 showed inflammation and fibrosis - he will have follow up CT chest in February 2023  Centrilobular emphysema  with extensive history of smoking. - continue breztri - prn albuterol   Obstructive sleep apnea. 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 - followed by Dr. Harl Bowie with cardiology  Time Spent Involved in Patient Care on Day of Examination:  32 minutes  Follow up:   Patient Instructions  Follow up in 4 months in [place] office  Medication List:   Allergies as of 09/22/2021   No  Known Allergies      Medication List        Accurate as of September 22, 2021  1:35 PM. If you have any questions, ask your nurse or doctor.          albuterol 108 (90 Base) MCG/ACT inhaler Commonly known as: VENTOLIN HFA Inhale 2 puffs into the lungs every 6 (six) hours as needed for wheezing or shortness of breath.   amLODipine 2.5 MG tablet Commonly known as: NORVASC Take 1 tablet (2.5 mg total) by mouth daily.   Breztri Aerosphere 160-9-4.8 MCG/ACT Aero Generic drug: Budeson-Glycopyrrol-Formoterol Inhale 2 puffs into the lungs 2 (two) times daily.   escitalopram 10 MG tablet Commonly known as: LEXAPRO Take 5 mg by mouth daily.   Melatonin 10 MG Caps Take 10 mg by mouth at bedtime.   metoprolol succinate 25 MG 24 hr tablet Commonly known as: Toprol XL Take 1 tablet (25 mg total) by mouth daily.   Multivitamin Adult Tabs Take 1 tablet by mouth daily.   omeprazole 20 MG capsule Commonly known as: PRILOSEC Take 20 mg by mouth daily.   rivaroxaban 20 MG Tabs tablet Commonly known as: XARELTO Take 1 tablet (20 mg total) by mouth daily with supper.   simvastatin 40 MG tablet Commonly known as: ZOCOR Take 40 mg by mouth at bedtime.   solifenacin 10 MG tablet Commonly known as: VESICARE Take 10 mg by mouth daily.   tamsulosin 0.4 MG Caps capsule Commonly known as: FLOMAX Take 0.4 mg by mouth daily.        Signature:  Chesley Mires, MD [place] Pager - [phone] 09/22/2021, 1:35 PM

## 2021-09-22 NOTE — Patient Instructions (Signed)
Follow up in 4 months in [place] office ?

## 2021-09-30 ENCOUNTER — Other Ambulatory Visit: Payer: Self-pay

## 2021-09-30 ENCOUNTER — Telehealth: Payer: Self-pay | Admitting: Pulmonary Disease

## 2021-09-30 ENCOUNTER — Ambulatory Visit: Payer: Medicare Other | Admitting: Adult Health

## 2021-09-30 ENCOUNTER — Ambulatory Visit (INDEPENDENT_AMBULATORY_CARE_PROVIDER_SITE_OTHER): Payer: Medicare Other

## 2021-09-30 ENCOUNTER — Encounter: Payer: Self-pay | Admitting: Adult Health

## 2021-09-30 VITALS — BP 130/80 | HR 68 | Temp 99.6°F | Ht 74.0 in | Wt 232.4 lb

## 2021-09-30 DIAGNOSIS — J432 Centrilobular emphysema: Secondary | ICD-10-CM | POA: Diagnosis not present

## 2021-09-30 DIAGNOSIS — R042 Hemoptysis: Secondary | ICD-10-CM

## 2021-09-30 DIAGNOSIS — J441 Chronic obstructive pulmonary disease with (acute) exacerbation: Secondary | ICD-10-CM | POA: Diagnosis not present

## 2021-09-30 DIAGNOSIS — J189 Pneumonia, unspecified organism: Secondary | ICD-10-CM

## 2021-09-30 LAB — CBC WITH DIFFERENTIAL/PLATELET
Basophils Absolute: 0.1 10*3/uL (ref 0.0–0.1)
Basophils Relative: 0.7 % (ref 0.0–3.0)
Eosinophils Absolute: 0 10*3/uL (ref 0.0–0.7)
Eosinophils Relative: 0.3 % (ref 0.0–5.0)
HCT: 46.2 % (ref 39.0–52.0)
Hemoglobin: 15.2 g/dL (ref 13.0–17.0)
Lymphocytes Relative: 7.7 % — ABNORMAL LOW (ref 12.0–46.0)
Lymphs Abs: 1.1 10*3/uL (ref 0.7–4.0)
MCHC: 33 g/dL (ref 30.0–36.0)
MCV: 95.4 fl (ref 78.0–100.0)
Monocytes Absolute: 0.6 10*3/uL (ref 0.1–1.0)
Monocytes Relative: 4.5 % (ref 3.0–12.0)
Neutro Abs: 12.5 10*3/uL — ABNORMAL HIGH (ref 1.4–7.7)
Neutrophils Relative %: 86.8 % — ABNORMAL HIGH (ref 43.0–77.0)
Platelets: 173 10*3/uL (ref 150.0–400.0)
RBC: 4.84 Mil/uL (ref 4.22–5.81)
RDW: 14.3 % (ref 11.5–15.5)
WBC: 14.4 10*3/uL — ABNORMAL HIGH (ref 4.0–10.5)

## 2021-09-30 LAB — BASIC METABOLIC PANEL
BUN: 13 mg/dL (ref 6–23)
CO2: 27 mEq/L (ref 19–32)
Calcium: 9.1 mg/dL (ref 8.4–10.5)
Chloride: 102 mEq/L (ref 96–112)
Creatinine, Ser: 1.23 mg/dL (ref 0.40–1.50)
GFR: 56.01 mL/min — ABNORMAL LOW (ref >60.00)
Glucose, Bld: 116 mg/dL — ABNORMAL HIGH (ref 70–99)
Potassium: 3.8 mEq/L (ref 3.5–5.1)
Sodium: 137 mEq/L (ref 135–145)

## 2021-09-30 LAB — BRAIN NATRIURETIC PEPTIDE: Pro B Natriuretic peptide (BNP): 166 pg/mL — ABNORMAL HIGH (ref 0.0–100.0)

## 2021-09-30 MED ORDER — BENZONATATE 200 MG PO CAPS: 200.0000 mg | ORAL_CAPSULE | Freq: Three times a day (TID) | ORAL | 1 refills | Status: DC | PRN

## 2021-09-30 MED ORDER — AMOXICILLIN-POT CLAVULANATE 875-125 MG PO TABS: 1.0000 | ORAL_TABLET | Freq: Two times a day (BID) | ORAL | 0 refills | Status: AC

## 2021-09-30 NOTE — Assessment & Plan Note (Signed)
Recent left upper lobe pneumonia.  Patient continues to have lingering symptoms.  We will check chest x-ray today.  Treat with empiric antibiotics.  Plan Patient Instructions  Covid 19 test , call if positive .  Augmentin 875mg  Twice daily  for 1 week  Hold Xarelto today  Begin Robitussin DM 2 tsp every 4hr for cough as needed  Begin Tessalon Three times a day for cough as needed  Chest xray and labs today  Follow up with Dr. Halford Chessman  or Kynesha Guerin in 1 week and As needed   Please contact office for sooner follow up if symptoms do not improve or worsen or seek emergency care

## 2021-09-30 NOTE — Telephone Encounter (Signed)
Patient last seen 09/22/2021.  Patient stated that he starting stopping up blood last night. Blood is the size of a dime and bright red in color.  He stated that he coughed up blood 30-50x last night. He is coughing up a very small amount of blood this morning.  He is also experiencing increased weakness and increased sob.  He experienced similar sx 7 weeks ago and was dx with PNA.   Dr. Halford Chessman, please advise. Thanks

## 2021-09-30 NOTE — Progress Notes (Signed)
@Patient  ID: Jonathan Greene, male    DOB: 1941/02/12, 80 y.o.   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 ) , history of asbestos exposure  TEST/EVENTS :  PFT 06/30/20 >> FEV1 3.50 (99%), FEV1% 77, TLC 7.09 (90%), DLCO 51% Lt upper lobe core needle biopsy 07/01/20 >> benign lun g with inflammation and fibrosis   Chest Imaging:  CT chest 06/03/20 >> atherosclerosis, aortic root 4.3 cm, severe centrilobular emphysema, LUL subpleural mass like consolidation 4.4 x 2.3 x 2.2 cm  PET scan 06/16/20 >> 2.4 x 2.3 cm pleural based LUL nodule 6.2 SUV, 6 mm AP window node 4.2 SUV, ascending aorta 4.1 cm, centrilobular and paraseptal emphysema PET scan 08/16/20 >> decreased size and SUV of LUL 2.2 x 1.2 cm mass, AP window LAN resolved CT chest 07/18/21 >> ascending aorta 4.3 cm, atherosclerosis, mod emphysema, new LUL patchy consolidation, plaque like lesion LUL   Sleep Tests:  Auto CPAP 08/15/20 to 09/13/20 >> used on 30 of 30 nights with average 7 hrs 46 min.  Average AHI 4.2 with median CPAP 6 and 95 th percentile CPAP 9 cm H2O   Cardiac Tests:  Echo 06/30/20 >> EF 55 to 60%, mod LVH  09/30/2021 Follow up : COPD and Hemoptysis  Patient presents for an acute office visit.  Patient complains of over the last 2 months he has had increased cough congestion and shortness of breath.  He was treated for a probable pneumonia in August when he had productive cough with blood-tinged mucus.  CT chest was negative for PE.  New patchy consolidation in the left upper lobe.  Patient says he got better with antibiotics but never got totally over it continue to have ongoing cough and 3 weeks ago was seen at urgent care and prescribed another antibiotic and steroids.  Patient says he did get some improvement but last  night started having increased cough and congestion and coughed up blood several times.  Blood has been mixed in his mucus initially bright red and now dark brown to burgundy.  Patient does have a history of A. fib and is on Xarelto.  He endorses compliance.  He denies any chest pain abdominal pain nausea vomiting or diarrhea.  Patient says appetite is good.  He has no weight loss. Patient is not on oxygen.  O2 saturations were 90% on room air today in office He does have sleep apnea is on nocturnal CPAP.  Says he uses his CPAP every single night.    No Known Allergies  Immunization History  Administered Date(s) Administered   Fluad Quad(high Dose 65+) 09/15/2020, 09/16/2021   Influenza, High Dose Seasonal PF 08/18/2019   Moderna Sars-Covid-2 Vaccination 11/19/2020    Past Medical History:  Diagnosis Date   A-fib (HCC)    Bradycardia    COPD (chronic obstructive pulmonary disease) (HCC)    Frequent urination    GERD (gastroesophageal reflux disease)    Hypercholesterolemia    Hypertension    MI (myocardial infarction) ([place])    Prostate cancer ([place])    RADIATION TX AND SCHEDULED FOR SEED IMPLANTS 02-28-12   Sleep apnea    Stroke ([place])    2011;ST memory loss, no other deficits.   Urinary urgency     Tobacco History: Social History   Tobacco Use  Smoking Status Former   Packs/day: 2.00   Years: 35.00   Pack years: 70.00   Types: Cigarettes   Quit date: 02/21/1991   Years since quitting: 30.6  Smokeless Tobacco Current   Types: Chew  Tobacco Comments   CHEW TOBACCO FOR 30 YRS    Ready to quit: Not Answered Counseling given: Not Answered Tobacco comments: CHEW TOBACCO FOR 20 YRS    Outpatient Medications Prior to Visit  Medication Sig Dispense Refill   albuterol (VENTOLIN HFA) 108 (90 Base) MCG/ACT inhaler Inhale 2 puffs into the lungs every 6 (six) hours as needed for wheezing or shortness of breath. 6.7 g 5   BREZTRI AEROSPHERE 160-9-4.8 MCG/ACT AERO Inhale 2  puffs into the lungs 2 (two) times daily.     escitalopram (LEXAPRO) 10 MG tablet Take 5 mg by mouth daily.     metoprolol succinate (TOPROL XL) 25 MG 24 hr tablet Take 1 tablet (25 mg total) by mouth daily. 30 tablet 11   Multiple Vitamin (MULTIVITAMIN ADULT) TABS Take 1 tablet by mouth daily.     omeprazole (PRILOSEC) 20 MG capsule Take 20 mg by mouth daily.     rivaroxaban (XARELTO) 20 MG TABS tablet Take 1 tablet (20 mg total) by mouth daily with supper. 90 tablet 4   simvastatin (ZOCOR) 40 MG tablet Take 40 mg by mouth at bedtime.      solifenacin (VESICARE) 10 MG tablet Take 10 mg by mouth daily.      tamsulosin (FLOMAX) 0.4 MG CAPS capsule Take 0.4 mg by mouth daily.     amLODipine (NORVASC) 2.5 MG tablet Take 1 tablet (2.5 mg total) by mouth daily. (Patient not taking: No sig reported) 90 tablet 3   Melatonin 10 MG CAPS Take 10 mg by mouth at bedtime.  (Patient not taking: No sig reported)     No facility-administered medications prior to visit.     Review of Systems:   Constitutional:   No  weight loss, night sweats,  Fevers, chills,  +fatigue, or  lassitude.  HEENT:   No headaches,  Difficulty swallowing,  Tooth/dental problems, or  Sore throat,                No sneezing, itching, ear ache, nasal congestion, post nasal drip,   CV:  No chest pain,  Orthopnea, PND, swelling in lower extremities, anasarca, dizziness, palpitations, syncope.   GI  No heartburn, indigestion, abdominal pain, nausea, vomiting, diarrhea, change in bowel habits, loss of appetite, bloody stools.   Resp:   No chest wall deformity  Skin: no rash or lesions.  GU: no dysuria, change in color of urine, no urgency or frequency.  No flank pain, no hematuria   MS:  No joint pain or swelling.  No decreased range of motion.  No back pain.    Physical Exam  BP 130/80 (BP Location: Left Arm, Patient Position: Sitting, Cuff Size: Large)   Pulse 68   Temp 99.6 F (37.6 C) (Oral)   Ht 6\' 2"  (1.88 m)    Wt 232 lb 6.4 oz (105.4 kg)   SpO2 90%   BMI 29.84 kg/m   GEN: A/Ox3; pleasant , NAD, well nourished    HEENT:  [place]/AT,  NOSE-clear, THROAT-clear, no lesions, no postnasal drip or exudate noted.   NECK:  Supple w/ fair ROM; no JVD; normal carotid impulses w/o bruits; no thyromegaly or nodules palpated; no lymphadenopathy.    RESP  Clear  P & A; w/o,  wheezes/ rales/ or rhonchi. no accessory muscle use, no dullness to percussion  CARD:  RRR, no m/r/g, tr  peripheral edema, pulses intact, no cyanosis or clubbing.  GI:   Soft & nt; nml bowel sounds; no organomegaly or masses detected.   Musco: Warm bil, no deformities or joint swelling noted.   Neuro: alert, no focal deficits noted.    Skin: Warm, no lesions or rashes    Lab Results:  CBC   BMET   BNP No results found for: BNP  ProBNP    Component Value Date/Time   PROBNP 166.0 (H) 09/30/2021 1241    Imaging:     PFT Results Latest Ref Rng & Units 06/30/2020 03/19/2015  FVC-Pre L 4.45 4.87  FVC-Predicted Pre % 91 93  FVC-Post L 4.57 4.92  FVC-Predicted Post % 94 94  Pre FEV1/FVC % % 76 74  Post FEV1/FCV % % 77 74  FEV1-Pre L 3.40 3.63  FEV1-Predicted Pre % 96 94  FEV1-Post L 3.50 3.66  DLCO uncorrected ml/min/mmHg 14.37 20.98  DLCO UNC% % 51 53  DLVA Predicted % 64 52  TLC L 7.09 7.56  TLC % Predicted % 90 94  RV % Predicted % 79 94    No results found for: NITRICOXIDE      Assessment & Plan:   COPD with acute exacerbation (HCC) Slow to resolve COPD exacerbation.  Check chest x-ray and labs today.  Continue on triple therapy maintenance inhaler. Empiric antibiotics.  Hold on steroids at this time  Plan  Patient Instructions  Covid 19 test , call if positive .  Augmentin 875mg  Twice daily  for 1 week  Hold Xarelto today  Begin Robitussin DM 2 tsp every 4hr for cough as needed  Begin Tessalon Three times a day for cough as needed  Chest xray and labs today  Follow up with Dr. Halford Chessman  or  Alexie Samson in 1 week and As needed   Please contact office for sooner follow up if symptoms do not improve or worsen or seek emergency care       CAP (community acquired pneumonia) Recent left upper lobe pneumonia.  Patient continues to have lingering symptoms.  We will check chest x-ray today.  Treat with empiric antibiotics.  Plan Patient Instructions  Covid 19 test , call if positive .  Augmentin 875mg  Twice daily  for 1 week  Hold Xarelto today  Begin Robitussin DM 2 tsp every 4hr for cough as needed  Begin Tessalon Three times a day for cough as needed  Chest xray and labs today  Follow up with Dr. Halford Chessman  or Taray Normoyle in 1 week and As needed   Please contact office for sooner follow up if symptoms do not improve or worsen or seek emergency care       Hemoptysis Hemoptysis questionable etiology.  Patient has acute illness with recent pneumonia.  May be due to infectious source.  Will treat with empiric antibiotics.  Hold Xarelto for today.  Patient is advised if symptoms do not improve or worsen he is to seek emergency room care. Will have close follow-up in 1 week in the office  Plan  Patient Instructions  Covid 19 test , call if positive .  Augmentin 875mg  Twice daily  for 1 week  Hold Xarelto today  Begin Robitussin DM 2 tsp every 4hr for cough as needed  Begin Tessalon Three times a day for cough as needed  Chest xray and labs today  Follow up  with Dr. Halford Chessman  or Shiana Rappleye in 1 week and As needed   Please contact office for sooner follow up if symptoms do not improve or worsen or seek emergency care       I spent   40 minutes dedicated to the care of this patient on the date of this encounter to include pre-visit review of records, face-to-face time with the patient discussing conditions above, post visit ordering of testing, clinical documentation with the electronic health record, making appropriate referrals as documented, and communicating necessary findings to members of  the patients care team.    Rexene Edison, NP 09/30/2021

## 2021-09-30 NOTE — Assessment & Plan Note (Signed)
Slow to resolve COPD exacerbation.  Check chest x-ray and labs today.  Continue on triple therapy maintenance inhaler. Empiric antibiotics.  Hold on steroids at this time  Plan  Patient Instructions  Covid 19 test , call if positive .  Augmentin 875mg  Twice daily  for 1 week  Hold Xarelto today  Begin Robitussin DM 2 tsp every 4hr for cough as needed  Begin Tessalon Three times a day for cough as needed  Chest xray and labs today  Follow up with Dr. Halford Chessman  or Kemyra August in 1 week and As needed   Please contact office for sooner follow up if symptoms do not improve or worsen or seek emergency care

## 2021-09-30 NOTE — Telephone Encounter (Signed)
He needs to come in for a visit today with a chest xray.

## 2021-09-30 NOTE — Assessment & Plan Note (Addendum)
Hemoptysis questionable etiology.  Patient has acute illness with recent pneumonia.  May be due to infectious source.  Will treat with empiric antibiotics.  Hold Xarelto for today.  Patient is advised if symptoms do not improve or worsen he is to seek emergency room care. Will have close follow-up in 1 week in the office Check labs today with CBC and BNP  Plan  Patient Instructions  Covid 19 test , call if positive .  Augmentin 875mg  Twice daily  for 1 week  Hold Xarelto today  Begin Robitussin DM 2 tsp every 4hr for cough as needed  Begin Tessalon Three times a day for cough as needed  Chest xray and labs today  Follow up with Dr. Halford Chessman  or Adaly Puder in 1 week and As needed   Please contact office for sooner follow up if symptoms do not improve or worsen or seek emergency care

## 2021-09-30 NOTE — Patient Instructions (Addendum)
Covid 19 test , call if positive .  Augmentin 875mg  Twice daily  for 1 week  Hold Xarelto today  Begin Robitussin DM 2 tsp every 4hr for cough as needed  Begin Tessalon Three times a day for cough as needed  Chest xray and labs today  Follow up with Dr. Halford Chessman  or Yasin Ducat in 1 week and As needed   Please contact office for sooner follow up if symptoms do not improve or worsen or seek emergency care

## 2021-09-30 NOTE — Progress Notes (Signed)
Reviewed and agree with assessment/plan.   Chesley Mires, MD [facility] Pulmonary/Critical Care 09/30/2021, 2:11 PM Pager:  [phone]

## 2021-09-30 NOTE — Telephone Encounter (Signed)
Acute visit scheduled for 09/30/2021 at 12:00. CXR ordered. Patient is aware and voiced his understanding.  Address and phone number provided.   Routing to TP as an [place].

## 2021-10-03 NOTE — Progress Notes (Signed)
Called and spoke with patient, advised of results/recommendations per Rexene Edison NP.  He verbalized understanding.  He states he is feeling better and the robitussin is helping.  Nothing further needed.

## 2021-10-06 ENCOUNTER — Other Ambulatory Visit: Payer: Self-pay

## 2021-10-06 ENCOUNTER — Encounter: Payer: Self-pay | Admitting: Adult Health

## 2021-10-06 ENCOUNTER — Ambulatory Visit (INDEPENDENT_AMBULATORY_CARE_PROVIDER_SITE_OTHER): Payer: Medicare Other

## 2021-10-06 ENCOUNTER — Ambulatory Visit: Payer: Medicare Other | Admitting: Adult Health

## 2021-10-06 VITALS — BP 120/80 | HR 70 | Temp 98.3°F | Ht 74.0 in | Wt 228.6 lb

## 2021-10-06 DIAGNOSIS — J189 Pneumonia, unspecified organism: Secondary | ICD-10-CM

## 2021-10-06 DIAGNOSIS — J441 Chronic obstructive pulmonary disease with (acute) exacerbation: Secondary | ICD-10-CM

## 2021-10-06 DIAGNOSIS — R042 Hemoptysis: Secondary | ICD-10-CM | POA: Diagnosis not present

## 2021-10-06 MED ORDER — ALBUTEROL SULFATE (2.5 MG/3ML) 0.083% IN NEBU: 2.5000 mg | INHALATION_SOLUTION | Freq: Four times a day (QID) | RESPIRATORY_TRACT | 5 refills | Status: DC | PRN

## 2021-10-06 MED ORDER — AMOXICILLIN-POT CLAVULANATE 875-125 MG PO TABS: 1.0000 | ORAL_TABLET | Freq: Two times a day (BID) | ORAL | 0 refills | Status: AC

## 2021-10-06 MED ORDER — PREDNISONE 20 MG PO TABS: 20.0000 mg | ORAL_TABLET | Freq: Every day | ORAL | 0 refills | Status: DC

## 2021-10-06 MED ORDER — ALBUTEROL SULFATE (2.5 MG/3ML) 0.083% IN NEBU: 2.5000 mg | INHALATION_SOLUTION | Freq: Once | RESPIRATORY_TRACT | Status: DC

## 2021-10-06 NOTE — Assessment & Plan Note (Signed)
Recurrent left upper lobe pneumonia.  Chest x-ray shows significant improvement. Patient is to complete 10-day course of Augmentin.  Plan  Patient Instructions  Extend Augmentin for 3 days.  Liquid Mucinex DM Twice daily  for cough As needed   Tessalon Three times a day for cough as needed  Prednisone 20mg  daily for 5 days .  Sputum culture .  Albuterol Inhaler or neb every 6hr as needed.  Follow up with Dr. Halford Chessman  or Maralee Higuchi in 3-4  weeks and As needed   Please contact office for sooner follow up if symptoms do not improve or worsen or seek emergency care

## 2021-10-06 NOTE — Progress Notes (Signed)
Reviewed and agree with assessment/plan.   Chesley Mires, MD [facility] Pulmonary/Critical Care 10/06/2021, 2:56 PM Pager:  [phone]

## 2021-10-06 NOTE — Progress Notes (Signed)
@Patient  ID: Jonathan Greene, male    DOB: [birth year], 80 y.o.   MRN: [id number]  Chief Complaint  Patient presents with   Follow-up    Referring provider: Pablo Lawrence, NP  HPI: 80 year old male former smoker followed for COPD, obstructive sleep apnea, left lung mass (biopsy July 2021 with inflammation/fibrosis), history of asbestos exposure  TEST/EVENTS :  PFT 06/30/20 >> FEV1 3.50 (99%), FEV1% 77, TLC 7.09 (90%), DLCO 51% Lt upper lobe core needle biopsy 07/01/20 >> benign lun g with inflammation and fibrosis   Chest Imaging:  CT chest 06/03/20 >> atherosclerosis, aortic root 4.3 cm, severe centrilobular emphysema, LUL subpleural mass like consolidation 4.4 x 2.3 x 2.2 cm  PET scan 06/16/20 >> 2.4 x 2.3 cm pleural based LUL nodule 6.2 SUV, 6 mm AP window node 4.2 SUV, ascending aorta 4.1 cm, centrilobular and paraseptal emphysema PET scan 08/16/20 >> decreased size and SUV of LUL 2.2 x 1.2 cm mass, AP window LAN resolved CT chest 07/18/21 >> ascending aorta 4.3 cm, atherosclerosis, mod emphysema, new LUL patchy consolidation, plaque like lesion LUL   Sleep Tests:  Auto CPAP 08/15/20 to 09/13/20 >> used on 30 of 30 nights with average 7 hrs 46 min.  Average AHI 4.2 with median CPAP 6 and 95 th percentile CPAP 9 cm H2O   Cardiac Tests:  Echo 06/30/20 >> EF 55 to 60%, mod LVH  10/06/2021 Follow up : COPD  Patient presents for a 1 week follow-up.  Patient's been having difficulty over the last 2 months with cough, congestion and shortness of breath.  August 2022 he was treated for a left upper lobe pneumonia.  He had associated cough and hemoptysis.  CT chest was negative for PE.  Showed a new patchy consolidation in the left upper lobe.  Patient said he did improve with antibiotics but never totally resolved.  Patient was seen in the urgent care 4 weeks ago with acute bronchitic symptoms and was treated with antibiotics and steroids.  Patient said same thing happened he improved but  never totally resolved.  Patient was seen last week with increasing cough and hemoptysis.  Chest x-ray last week showed increased left upper lobe consolidation.  Patient was started on Augmentin x1 week.  Lab work showed normal BNP.  CBC showed elevated white count with a left shift.  Normal hemoglobin hematocrit.  And platelet. Patient is on Xarelto for A. fib.  Patient says since last visit.  His hemoptysis has almost totally resolved.  Has coughed up a little bit of brown mucus.  Patient says he does feel slightly better but continues to have ongoing cough and congestion.  And has generalized weakness.  Patient says his appetite is very good.  No nausea vomiting or diarrhea.  He does have shortness of breath that is worse with activity.  He has no chest pain, syncope or increased leg swelling.  Chest x-ray today shows clearing of the recurrent left upper lobe opacity.     No Known Allergies  Immunization History  Administered Date(s) Administered   Fluad Quad(high Dose 65+) 09/15/2020, 09/16/2021   Influenza, High Dose Seasonal PF 08/18/2019   Moderna Sars-Covid-2 Vaccination 11/19/2020    Past Medical History:  Diagnosis Date   A-fib ([place])    Bradycardia    COPD (chronic obstructive pulmonary disease) (HCC)    Frequent urination    GERD (gastroesophageal reflux disease)    Hypercholesterolemia    Hypertension    MI (myocardial infarction) ([place])  Prostate cancer ([place])    RADIATION TX AND SCHEDULED FOR SEED IMPLANTS 02-28-12   Sleep apnea    Stroke ([place])    2011;ST memory loss, no other deficits.   Urinary urgency     Tobacco History: Social History   Tobacco Use  Smoking Status Former   Packs/day: 2.00   Years: 35.00   Pack years: 70.00   Types: Cigarettes   Quit date: 02/21/1991   Years since quitting: 30.6  Smokeless Tobacco Current   Types: Chew  Tobacco Comments   CHEW TOBACCO FOR 9 YRS    Ready to quit: Not Answered Counseling given: Not Answered Tobacco  comments: CHEW TOBACCO FOR 20 YRS    Outpatient Medications Prior to Visit  Medication Sig Dispense Refill   albuterol (VENTOLIN HFA) 108 (90 Base) MCG/ACT inhaler Inhale 2 puffs into the lungs every 6 (six) hours as needed for wheezing or shortness of breath. 6.7 g 5   amoxicillin-clavulanate (AUGMENTIN) 875-125 MG tablet Take 1 tablet by mouth 2 (two) times daily for 7 days. 14 tablet 0   benzonatate (TESSALON) 200 MG capsule Take 1 capsule (200 mg total) by mouth 3 (three) times daily as needed for cough. 30 capsule 1   BREZTRI AEROSPHERE 160-9-4.8 MCG/ACT AERO Inhale 2 puffs into the lungs 2 (two) times daily.     escitalopram (LEXAPRO) 10 MG tablet Take 5 mg by mouth daily.     metoprolol succinate (TOPROL XL) 25 MG 24 hr tablet Take 1 tablet (25 mg total) by mouth daily. 30 tablet 11   Multiple Vitamin (MULTIVITAMIN ADULT) TABS Take 1 tablet by mouth daily.     omeprazole (PRILOSEC) 20 MG capsule Take 20 mg by mouth daily.     rivaroxaban (XARELTO) 20 MG TABS tablet Take 1 tablet (20 mg total) by mouth daily with supper. 90 tablet 4   simvastatin (ZOCOR) 40 MG tablet Take 40 mg by mouth at bedtime.      solifenacin (VESICARE) 10 MG tablet Take 10 mg by mouth daily.      tamsulosin (FLOMAX) 0.4 MG CAPS capsule Take 0.4 mg by mouth daily.     No facility-administered medications prior to visit.     Review of Systems:   Constitutional:   No  weight loss, night sweats,  Fevers, chills,  +fatigue, or  lassitude.  HEENT:   No headaches,  Difficulty swallowing,  Tooth/dental problems, or  Sore throat,                No sneezing, itching, ear ache, nasal congestion, post nasal drip,   CV:  No chest pain,  Orthopnea, PND, swelling in lower extremities, anasarca, dizziness, palpitations, syncope.   GI  No heartburn, indigestion, abdominal pain, nausea, vomiting, diarrhea, change in bowel habits, loss of appetite, bloody stools.   Resp: .  No chest wall deformity  Skin: no rash or  lesions.  GU: no dysuria, change in color of urine, no urgency or frequency.  No flank pain, no hematuria   MS:  No joint pain or swelling.  No decreased range of motion.  No back pain.    Physical Exam  BP 120/80 (BP Location: Left Arm, Patient Position: Sitting, Cuff Size: Large)   Pulse 70   Temp 98.3 F (36.8 C) (Oral)   Ht 6\' 2"  (1.88 m)   Wt 228 lb 9.6 oz (103.7 kg)   SpO2 92%   BMI 29.35 kg/m   GEN: A/Ox3; pleasant , NAD, well nourished  HEENT:  [place]/AT,  NOSE-clear, THROAT-clear, no lesions, no postnasal drip or exudate noted.   NECK:  Supple w/ fair ROM; no JVD; normal carotid impulses w/o bruits; no thyromegaly or nodules palpated; no lymphadenopathy.    RESP  few trace rhonchi  no accessory muscle use, no dullness to percussion  CARD:  RRR, no m/r/g, no peripheral edema, pulses intact, no cyanosis or clubbing.  GI:   Soft & nt; nml bowel sounds; no organomegaly or masses detected.   Musco: Warm bil, no deformities or joint swelling noted.   Neuro: alert, no focal deficits noted.    Skin: Warm, no lesions or rashes    Lab Results:  CBC    Component Value Date/Time   WBC 14.4 (H) 09/30/2021 1241   RBC 4.84 09/30/2021 1241   HGB 15.2 09/30/2021 1241   HCT 46.2 09/30/2021 1241   PLT 173.0 09/30/2021 1241   MCV 95.4 09/30/2021 1241   MCH 32.6 07/18/2021 0945   MCHC 33.0 09/30/2021 1241   RDW 14.3 09/30/2021 1241   LYMPHSABS 1.1 09/30/2021 1241   MONOABS 0.6 09/30/2021 1241   EOSABS 0.0 09/30/2021 1241   BASOSABS 0.1 09/30/2021 1241    BMET    Component Value Date/Time   NA 137 09/30/2021 1241   K 3.8 09/30/2021 1241   CL 102 09/30/2021 1241   CO2 27 09/30/2021 1241   GLUCOSE 116 (H) 09/30/2021 1241   BUN 13 09/30/2021 1241   CREATININE 1.23 09/30/2021 1241   CALCIUM 9.1 09/30/2021 1241   GFRNONAA >60 07/18/2021 0945   GFRAA >60 01/20/2017 1600    BNP No results found for: BNP  ProBNP    Component Value Date/Time   PROBNP 166.0 (H)  09/30/2021 1241    Imaging: DG Chest 2 View  Result Date: 10/06/2021 CLINICAL DATA:  80 year old male with shortness of breath and cough. EXAM: CHEST - 2 VIEW COMPARISON:  Chest radiographs 09/30/2021 and earlier. FINDINGS: A degree of pulmonary hyperinflation is noted, lung volumes are stable since September, with emphysema demonstrated by CT in August. Left upper lobe opacity has regressed since last month and bilateral coarse lung markings now appear to be at baseline. No superimposed pneumothorax, pleural effusion or acute pulmonary opacity. Stable visualized osseous structures. Negative visible bowel gas. Abdomen Calcified aortic atherosclerosis. IMPRESSION: Chronic lung disease with Emphysema (ICD10-J43.9). Clearing of the recurrent left upper lobe opacity last month. Lungs now appear to be at baseline. No acute cardiopulmonary abnormality. Electronically Signed   By: Genevie Ann M.D.   On: 10/06/2021 11:12   DG Chest 2 View  Result Date: 09/30/2021 CLINICAL DATA:  Coughing up blood. EXAM: CHEST - 2 VIEW COMPARISON:  Radiographs 08/05/2021 and 07/18/2021.  CT 07/18/2021. FINDINGS: The heart size and mediastinal contours are stable with aortic atherosclerosis. Again demonstrated is chronic lung disease with emphysema, subpleural reticulation and scattered scarring. There is recurrent patchy opacity peripherally in the left upper lobe. This was demonstrated on the 07/18/2021 studies, but had improved on the most recent study of 08/05/2021. No other focal airspace disease, pleural effusion or pneumothorax. The bones appear unchanged. IMPRESSION: Recurrent airspace disease peripherally in the left upper lobe which may reflect recurrent pneumonia or pulmonary hemorrhage given the patient's hemoptysis. Recommend radiographic follow-up to document clearing. Underlying chronic lung disease with emphysema and scattered scarring. Electronically Signed   By: Richardean Sale M.D.   On: 09/30/2021 12:49   DG  Lumbar Spine 2-3 Views  Result Date: 09/19/2021 CLINICAL DATA:  Low back pain. EXAM: LUMBAR SPINE - 2-3 VIEW COMPARISON:  CT renal stone 01/20/2017. CT angiogram chest 07/18/2021. CT chest 05/23/2021. FINDINGS: Alignment is normal. There is mild compression deformity of the T12 vertebral body which appears unchanged from the prior examination. No acute fractures are seen. There is stable mild disc space narrowing at L4-L5. Mild degenerative endplate osteophytes are seen at L4-L5 and L5-S1. There are significant atherosclerotic calcifications throughout the aorta. The bones are osteopenic. IMPRESSION: 1. No acute bony abnormality. 2. Stable mild degenerative changes. 3. Stable mild compression deformity of T12. 4.  Aortic Atherosclerosis (ICD10-I70.0). Electronically Signed   By: Ronney Asters M.D.   On: 09/19/2021 21:26      PFT Results Latest Ref Rng & Units 06/30/2020 03/19/2015  FVC-Pre L 4.45 4.87  FVC-Predicted Pre % 91 93  FVC-Post L 4.57 4.92  FVC-Predicted Post % 94 94  Pre FEV1/FVC % % 76 74  Post FEV1/FCV % % 77 74  FEV1-Pre L 3.40 3.63  FEV1-Predicted Pre % 96 94  FEV1-Post L 3.50 3.66  DLCO uncorrected ml/min/mmHg 14.37 20.98  DLCO UNC% % 51 53  DLVA Predicted % 64 52  TLC L 7.09 7.56  TLC % Predicted % 90 94  RV % Predicted % 79 94    No results found for: NITRICOXIDE      Assessment & Plan:   COPD with acute exacerbation (HCC) Slow to resolve/recurrent COPD exacerbation with associated left upper lobe pneumonia.  Patient is making slow clinical improvement.  Chest x-ray today shows significant improvement.  We will extend out antibiotics for additional 3 days.  Check sputum culture.  Increase mucociliary clearance-add in home albuterol nebulizer. Albuterol nebulizer given in the office today. Give short course of empiric steroids.  Plan  Patient Instructions  Extend Augmentin for 3 days.  Liquid Mucinex DM Twice daily  for cough As needed   Tessalon Three times a  day for cough as needed  Prednisone 20mg  daily for 5 days .  Sputum culture .  Albuterol Inhaler or neb every 6hr as needed.  Follow up with Dr. Halford Chessman  or Paighton Godette in 3-4  weeks and As needed   Please contact office for sooner follow up if symptoms do not improve or worsen or seek emergency care       Hemoptysis Resolved  CAP (community acquired pneumonia) Recurrent left upper lobe pneumonia.  Chest x-ray shows significant improvement. Patient is to complete 10-day course of Augmentin.  Plan  Patient Instructions  Extend Augmentin for 3 days.  Liquid Mucinex DM Twice daily  for cough As needed   Tessalon Three times a day for cough as needed  Prednisone 20mg  daily for 5 days .  Sputum culture .  Albuterol Inhaler or neb every 6hr as needed.  Follow up with Dr. Halford Chessman  or [place] in 3-4  weeks and As needed   Please contact office for sooner follow up if symptoms do not improve or worsen or seek emergency care       I spent   40 minutes dedicated to the care of this patient on the date of this encounter to include pre-visit review of records, face-to-face time with the patient discussing conditions above, post visit ordering of testing, clinical documentation with the electronic health record, making appropriate referrals as documented, and communicating necessary findings to members of the patients care team.    Rexene Edison, NP 10/06/2021

## 2021-10-06 NOTE — Assessment & Plan Note (Signed)
Resolved

## 2021-10-06 NOTE — Patient Instructions (Addendum)
Extend Augmentin for 3 days.  Liquid Mucinex DM Twice daily  for cough As needed   Tessalon Three times a day for cough as needed  Prednisone 20mg  daily for 5 days .  Sputum culture .  Albuterol Inhaler or neb every 6hr as needed.  Follow up with Dr. Halford Chessman  or Kewan Mcnease in 3-4  weeks and As needed   Please contact office for sooner follow up if symptoms do not improve or worsen or seek emergency care

## 2021-10-06 NOTE — Assessment & Plan Note (Signed)
Slow to resolve/recurrent COPD exacerbation with associated left upper lobe pneumonia.  Patient is making slow clinical improvement.  Chest x-ray today shows significant improvement.  We will extend out antibiotics for additional 3 days.  Check sputum culture.  Increase mucociliary clearance-add in home albuterol nebulizer. Albuterol nebulizer given in the office today. Give short course of empiric steroids.  Plan  Patient Instructions  Extend Augmentin for 3 days.  Liquid Mucinex DM Twice daily  for cough As needed   Tessalon Three times a day for cough as needed  Prednisone 20mg  daily for 5 days .  Sputum culture .  Albuterol Inhaler or neb every 6hr as needed.  Follow up with Dr. Halford Chessman  or Izora Benn in 3-4  weeks and As needed   Please contact office for sooner follow up if symptoms do not improve or worsen or seek emergency care

## 2021-10-10 ENCOUNTER — Other Ambulatory Visit: Payer: Medicare Other

## 2021-10-12 ENCOUNTER — Encounter: Payer: Self-pay | Admitting: Internal Medicine

## 2021-10-18 ENCOUNTER — Other Ambulatory Visit: Payer: Medicare Other

## 2021-10-18 DIAGNOSIS — J189 Pneumonia, unspecified organism: Secondary | ICD-10-CM

## 2021-10-21 LAB — RESPIRATORY CULTURE OR RESPIRATORY AND SPUTUM CULTURE
MICRO NUMBER:: 12639206
SPECIMEN QUALITY:: ADEQUATE

## 2021-10-21 MED ORDER — LEVOFLOXACIN 500 MG PO TABS: 500.0000 mg | ORAL_TABLET | Freq: Every day | ORAL | 0 refills | Status: AC

## 2021-11-03 ENCOUNTER — Telehealth: Payer: Self-pay | Admitting: Adult Health

## 2021-11-03 NOTE — Telephone Encounter (Signed)
I called the patient and he reports that he is coughing up has some green mucous and is dark colored and he has noticed a dime sized amount that comes up when some of the time when he is coughing. He was changed on ABT 3 weeks ago but he was playing on prednisone previously. Please advise

## 2021-11-03 NOTE — Telephone Encounter (Signed)
Patient has an OV for 11/04/21 per Dr. Shearon Stalls. Nothing further needed.

## 2021-11-04 ENCOUNTER — Other Ambulatory Visit: Payer: Self-pay

## 2021-11-04 ENCOUNTER — Ambulatory Visit: Payer: Medicare Other | Admitting: Internal Medicine

## 2021-11-04 ENCOUNTER — Encounter: Payer: Self-pay | Admitting: Internal Medicine

## 2021-11-04 VITALS — BP 124/72 | HR 78 | Temp 97.8°F | Ht 74.0 in | Wt 231.0 lb

## 2021-11-04 DIAGNOSIS — I480 Paroxysmal atrial fibrillation: Secondary | ICD-10-CM

## 2021-11-04 DIAGNOSIS — J8489 Other specified interstitial pulmonary diseases: Secondary | ICD-10-CM | POA: Diagnosis not present

## 2021-11-04 DIAGNOSIS — J449 Chronic obstructive pulmonary disease, unspecified: Secondary | ICD-10-CM | POA: Diagnosis not present

## 2021-11-04 DIAGNOSIS — R042 Hemoptysis: Secondary | ICD-10-CM

## 2021-11-04 NOTE — Progress Notes (Signed)
Jonathan Greene    [id number]    1941-05-24  Primary Care Physician:Keatts, Loma Sousa, NP Date of Appointment: 11/04/2021 Established Patient Visit  Chief complaint:   Chief Complaint  Patient presents with   Acute Visit    Pt states he is still coughing up bloody phlegm which he has been having it for about 3-4 days now. Pt states he also has had some increased SOB.     HPI: Jonathan Greene is a 80 y.o. man with COPD and atrial fibrillation on xarelto. Patient of Dr. Halford Chessman.   Interval Updates: Last saw TP in end of October for COPD exacerbation.  He does have a daily chronic cough. He's had hemoptysis off and on since August 2022. Treated with abx at that time augmentin most recently in October 2022. Course was extended and he was also treated with prednisone. Was prescribed nebulizer and given mucinex. He stopped coughing up blood completely but then 4 days ago started coughing up blood mixed with sputum. Dark red compared to bright red. He has brought some blood mixed with sputum in to show me.   No longer having chest tightness and wheezing.  Using rescue inhalers - minimally.   Breztri twice a day.   I have reviewed the patient's family social and past medical history and updated as appropriate.   Past Medical History:  Diagnosis Date   A-fib (HCC)    Bradycardia    COPD (chronic obstructive pulmonary disease) (HCC)    Frequent urination    GERD (gastroesophageal reflux disease)    Hypercholesterolemia    Hypertension    MI (myocardial infarction) ([place])    Prostate cancer ([place])    RADIATION TX AND SCHEDULED FOR SEED IMPLANTS 02-28-12   Sleep apnea    Stroke ([place])    2011;ST memory loss, no other deficits.   Urinary urgency     Past Surgical History:  Procedure Laterality Date   CATARACT EXTRACTION W/PHACO Right 11/06/2013   Procedure: CATARACT EXTRACTION PHACO AND INTRAOCULAR LENS PLACEMENT (IOC);  Surgeon: Tonny Branch, MD;  Location: [facility];   Service: Ophthalmology;  Laterality: Right;  CDE 11.67   CATARACT EXTRACTION W/PHACO Left 11/25/2015   Procedure: CATARACT EXTRACTION PHACO AND INTRAOCULAR LENS PLACEMENT ; CDE:  8.15;  Surgeon: Tonny Branch, MD;  Location: [facility];  Service: Ophthalmology;  Laterality: Left;   COLONOSCOPY  10/03/2006   [id number] rectal polyps cold biopsied/removed, otherwise normal rectum/Sigmoid diverticula.  Remainder of colon mucosa appeared normal   COLONOSCOPY N/A 08/19/2014   RMR: Radiation proctitis-status post APC ablation. Colonic diverticulosis.   CYSTOSCOPY  02/28/2012   Procedure: CYSTOSCOPY;  Surgeon: Bernestine Amass, MD;  Location: [facility];  Service: Urology;  Laterality: N/A;   no seeds noted in bladder   RADIOACTIVE SEED IMPLANT  02/28/2012   Procedure: RADIOACTIVE SEED IMPLANT;  Surgeon: Bernestine Amass, MD;  Location[facility];  Service: Urology;  Laterality: N/A;  83seeds implanted     Family History  Problem Relation Age of Onset   Stroke Mother    Hypertension Mother    Colon cancer Sister        in her 13s   Breast cancer Sister    Heart attack Brother    COPD Maternal Aunt    COPD Sister    Hypertension Sister    Pneumonia Sister    COPD Son    Cancer Neg Hx  Social History   Occupational History   Occupation: retired  Tobacco Use   Smoking status: Former    Packs/day: 2.00    Years: 35.00    Pack years: 70.00    Types: Cigarettes    Quit date: 02/21/1991    Years since quitting: 30.7   Smokeless tobacco: Current    Types: Chew   Tobacco comments:    CHEW TOBACCO FOR 20 YRS   Vaping Use   Vaping Use: Never used  Substance and Sexual Activity   Alcohol use: Yes    Alcohol/week: 14.0 standard drinks    Types: 14 Shots of liquor per week    Comment: 4 ounces daily   Drug use: No   Sexual activity: Yes    Birth control/protection: None     Physical Exam: Blood pressure 124/72, pulse 78, temperature 97.8 F (36.6 C),  temperature source Oral, height 6\' 2"  (1.88 m), weight 231 lb (104.8 kg), SpO2 98 %.  Gen:      No acute distress ENT:  no nasal polyps, mucus membranes moist Lungs:    No increased respiratory effort, symmetric chest wall excursion, clear to auscultation bilaterally, no wheezes or crackles CV:         Regular rate and rhythm; no murmurs, rubs, or gallops.  No pedal edema   Data Reviewed: Imaging: I have personally reviewed the CT Chest August 2022 shows moderate to severe upper lobe predominat emphysema. LUL consolidation.   PFTs:  PFT Results Latest Ref Rng & Units 06/30/2020 03/19/2015  FVC-Pre L 4.45 4.87  FVC-Predicted Pre % 91 93  FVC-Post L 4.57 4.92  FVC-Predicted Post % 94 94  Pre FEV1/FVC % % 76 74  Post FEV1/FCV % % 77 74  FEV1-Pre L 3.40 3.63  FEV1-Predicted Pre % 96 94  FEV1-Post L 3.50 3.66  DLCO uncorrected ml/min/mmHg 14.37 20.98  DLCO UNC% % 51 53  DLVA Predicted % 64 52  TLC L 7.09 7.56  TLC % Predicted % 90 94  RV % Predicted % 79 94   I have personally reviewed the patient's PFTs and in July 2021 show no airflow limitation with reduced dlco and air trapping.   Labs: Lab Results  Component Value Date   WBC 14.4 (H) 09/30/2021   HGB 15.2 09/30/2021   HCT 46.2 09/30/2021   MCV 95.4 09/30/2021   PLT 173.0 09/30/2021   Lab Results  Component Value Date   NA 137 09/30/2021   K 3.8 09/30/2021   CL 102 09/30/2021   CO2 27 09/30/2021   Sputum culture Nov 2022 with klebsiella Immunization status: Immunization History  Administered Date(s) Administered   Fluad Quad(high Dose 65+) 09/15/2020, 09/16/2021   Influenza, High Dose Seasonal PF 08/18/2019   Moderna Sars-Covid-2 Vaccination 11/19/2020    External Records Personally Reviewed: PCCM, oncology  Assessment:  Hemoptysis COPD with recent exacerbation Organizing Pneumonia Recent Klebsiella PNA A. Fib on xarelto  Plan/Recommendations: I think his hemoptysis is resolving and he does not need to  go to the ED currently. Underlying OP with emphysema, recent CAP treated with augmentin and prednisone. Suspect hemoptysis worse in the setting of being on xarelto. At this point additional steroids and abx seem low yield. He does not have constitutional symptoms. Exam is benign. I think we can monitor expectantly and move up his CT Scan of his lungs scheduled in Feb 2023. He should have this done before follow-up with Dr. Halford Chessman.   I have given him return to  ED and return to care precautions for his hemoptysis.   Continue breztri and albuterol prn.  Flutter valve in future once his current irritation has improved may be helpful to help prevent pna and infection.    Return to Care: Follow up with Dr. Halford Chessman as scheduled.   Lenice Llamas, MD Pulmonary and [place]

## 2021-11-04 NOTE — Patient Instructions (Addendum)
Keep your follow up with Dr. Halford Chessman in January. We will try to move your CT scan to before then so you can review with him. Our office will contact you with this next week - let us know if you haven't heard anything next week.   I suspect this is all your pneumonia slowly resolving and hopefully will continue to improve.   Continue your Breztri and albuterol as needed.

## 2021-11-23 ENCOUNTER — Other Ambulatory Visit: Payer: Self-pay

## 2021-11-23 ENCOUNTER — Ambulatory Visit: Payer: Medicare Other | Admitting: Student

## 2021-11-23 ENCOUNTER — Encounter: Payer: Self-pay | Admitting: Student

## 2021-11-23 VITALS — BP 132/78 | HR 78 | Ht 75.0 in | Wt 238.6 lb

## 2021-11-23 DIAGNOSIS — G4733 Obstructive sleep apnea (adult) (pediatric): Secondary | ICD-10-CM | POA: Diagnosis not present

## 2021-11-23 DIAGNOSIS — I2510 Atherosclerotic heart disease of native coronary artery without angina pectoris: Secondary | ICD-10-CM

## 2021-11-23 DIAGNOSIS — I4819 Other persistent atrial fibrillation: Secondary | ICD-10-CM | POA: Diagnosis not present

## 2021-11-23 DIAGNOSIS — R918 Other nonspecific abnormal finding of lung field: Secondary | ICD-10-CM

## 2021-11-23 DIAGNOSIS — I7121 Aneurysm of the ascending aorta, without rupture: Secondary | ICD-10-CM

## 2021-11-23 DIAGNOSIS — I10 Essential (primary) hypertension: Secondary | ICD-10-CM

## 2021-11-23 NOTE — Progress Notes (Signed)
Cardiology Office Note    Date:  11/23/2021   ID:  Jonathan Greene, DOB 1941-12-01, MRN [id number]  PCP:  Jonathan Lawrence, NP  Cardiologist: Jonathan Dolly, MD    Chief Complaint  Patient presents with   Follow-up    6 month visit    History of Present Illness:    Jonathan Greene is a 80 y.o. 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  He was last examined by Dr. Harl Greene in 02/2021 for follow-up from his recent Coronary CT which had shown three-vessel CAD without significant stenosis by FFR. Continued risk factor modification was recommended and he was continued on Toprol-XL 25 mg daily, Simvastatin 40 mg daily and Xarelto 20 mg daily and he was not on ASA given the need for anticoagulation.  In talking with the patient today, he reports having pneumonia this fall and had to undergo several rounds of antibiotics to fully recover from his illness. Reports his symptoms have now improved and he is starting to get some of his strength back. He did have some chest discomfort with frequent coughing at that time but denies any recurrent symptoms. No recent exertional chest pain, palpitations, orthopnea, PND or pitting edema. He reports good compliance with anticoagulation. Says that he did have to hold Xarelto temporarily in 07/2021 due to hemoptysis at that time but resumed several days afterwards.  Past Medical History:  Diagnosis Date   A-fib (HCC)    Bradycardia    COPD  (chronic obstructive pulmonary disease) (HCC)    Frequent urination    GERD (gastroesophageal reflux disease)    Hypercholesterolemia    Hypertension    MI (myocardial infarction) ([place])    Prostate cancer ([place])    RADIATION TX AND SCHEDULED FOR SEED IMPLANTS 02-28-12   Sleep apnea    Stroke ([place])    2011;ST memory loss, no other deficits.   Urinary urgency     Past Surgical History:  Procedure Laterality Date   CATARACT EXTRACTION W/PHACO Right 11/06/2013   Procedure: CATARACT EXTRACTION PHACO AND INTRAOCULAR LENS PLACEMENT (IOC);  Surgeon: Jonathan Branch, MD;  Location: [facility];  Service: Ophthalmology;  Laterality: Right;  CDE 11.67   CATARACT EXTRACTION W/PHACO Left 11/25/2015   Procedure: CATARACT EXTRACTION PHACO AND INTRAOCULAR LENS PLACEMENT ; CDE:  8.15;  Surgeon: Jonathan Branch, MD;  Location: [facility];  Service: Ophthalmology;  Laterality: Left;   COLONOSCOPY  10/03/2006   [id number] rectal polyps cold biopsied/removed, otherwise normal rectum/Sigmoid diverticula.  Remainder of colon mucosa appeared normal   COLONOSCOPY N/A 08/19/2014   RMR: Radiation proctitis-status post APC ablation. Colonic diverticulosis.   CYSTOSCOPY  02/28/2012   Procedure: CYSTOSCOPY;  Surgeon: Jonathan Amass, MD;  Location: [facility];  Service: Urology;  Laterality: N/A;   no seeds noted in bladder   RADIOACTIVE SEED IMPLANT  02/28/2012   Procedure: RADIOACTIVE SEED IMPLANT;  Surgeon: Jonathan Amass, MD;  Location: [facility];  Service: Urology;  Laterality: N/A;  67seeds implanted  Current Medications: Outpatient Medications Prior to Visit  Medication Sig Dispense Refill   albuterol (PROVENTIL) (2.5 MG/3ML) 0.083% nebulizer solution Take 3 mLs (2.5 mg total) by nebulization every 6 (six) hours as needed for wheezing or shortness of breath. 75 mL 5   albuterol (VENTOLIN HFA) 108 (90 Base) MCG/ACT inhaler Inhale 2 puffs into the lungs every 6 (six) hours as needed for  wheezing or shortness of breath. 6.7 g 5   benzonatate (TESSALON) 200 MG capsule Take 1 capsule (200 mg total) by mouth 3 (three) times daily as needed for cough. 30 capsule 1   BREZTRI AEROSPHERE 160-9-4.8 MCG/ACT AERO Inhale 2 puffs into the lungs 2 (two) times daily.     escitalopram (LEXAPRO) 10 MG tablet Take 5 mg by mouth daily.     metoprolol succinate (TOPROL XL) 25 MG 24 hr tablet Take 1 tablet (25 mg total) by mouth daily. 30 tablet 11   Multiple Vitamin (MULTIVITAMIN ADULT) TABS Take 1 tablet by mouth daily.     omeprazole (PRILOSEC) 20 MG capsule Take 20 mg by mouth daily.     predniSONE (DELTASONE) 20 MG tablet Take 1 tablet (20 mg total) by mouth daily with breakfast. 5 tablet 0   rivaroxaban (XARELTO) 20 MG TABS tablet Take 1 tablet (20 mg total) by mouth daily with supper. 90 tablet 4   simvastatin (ZOCOR) 40 MG tablet Take 40 mg by mouth at bedtime.      solifenacin (VESICARE) 10 MG tablet Take 10 mg by mouth daily.      tamsulosin (FLOMAX) 0.4 MG CAPS capsule Take 0.4 mg by mouth daily.     Facility-Administered Medications Prior to Visit  Medication Dose Route Frequency Provider Last Rate Last Admin   albuterol (PROVENTIL) (2.5 MG/3ML) 0.083% nebulizer solution 2.5 mg  2.5 mg Nebulization Once Greene, Jonathan S, NP         Allergies:   Patient has no known allergies.   Social History   Socioeconomic History   Marital status: Married    Spouse name: Jonathan Greene   Number of children: 2   Years of education: Not on file   Highest education level: Not on file  Occupational History   Occupation: retired  Tobacco Use   Smoking status: Former    Packs/day: 2.00    Years: 35.00    Pack years: 70.00    Types: Cigarettes    Quit date: 02/21/1991    Years since quitting: 30.7   Smokeless tobacco: Current    Types: Chew   Tobacco comments:    CHEW TOBACCO FOR 20 YRS   Vaping Use   Vaping Use: Never used  Substance and Sexual Activity   Alcohol use: Yes    Alcohol/week:  14.0 standard drinks    Types: 14 Shots of liquor per week    Comment: 4 ounces daily   Drug use: No   Sexual activity: Yes    Birth control/protection: None  Other Topics Concern   Not on file  Social History Narrative   Not on file   Social Determinants of Health   Financial Resource Strain: Not on file  Food Insecurity: Not on file  Transportation Needs: Not on file  Physical Activity: Not on file  Stress: Not on file  Social Connections: Not on file     Family History:  The patient's family history includes Breast cancer in his sister; COPD in his maternal aunt, sister, and son; Colon cancer in his sister; Heart attack  in his brother; Hypertension in his mother and sister; Pneumonia in his sister; Stroke in his mother.   Review of Systems:    Please see the history of present illness.     All other systems reviewed and are otherwise negative except as noted above.   Physical Exam:    VS:  BP 132/78    Pulse 78    Ht 6\' 3"  (1.905 m)    Wt 238 lb 9.6 oz (108.2 kg)    SpO2 96%    BMI 29.82 kg/m    General: Well developed, well nourished,male appearing in no acute distress. Head: Normocephalic, atraumatic. Neck: No carotid bruits. JVD not elevated.  Lungs: Respirations regular and unlabored, without wheezes or rales.  Heart: Irregularly irregular. No S3 or S4.  No murmur, no rubs, or gallops appreciated. Abdomen: Appears non-distended. No obvious abdominal masses. Msk:  Strength and tone appear normal for age. No obvious joint deformities or effusions. Extremities: No clubbing or cyanosis. No pitting edema.  Distal pedal pulses are 2+ bilaterally. Neuro: Alert and oriented X 3. Moves all extremities spontaneously. No focal deficits noted. Psych:  Responds to questions appropriately with a normal affect. Skin: No rashes or lesions noted  Wt Readings from Last 3 Encounters:  11/23/21 238 lb 9.6 oz (108.2 kg)  11/04/21 231 lb (104.8 kg)  10/06/21 228 lb 9.6 oz (103.7 kg)      Studies/Labs Reviewed:   EKG:  EKG is not ordered today.    Recent Labs: 07/18/2021: ALT 56 09/30/2021: BUN 13; Creatinine, Ser 1.23; Hemoglobin 15.2; Platelets 173.0; Potassium 3.8; Pro B Natriuretic peptide (BNP) 166.0; Sodium 137   Lipid Panel No results found for: CHOL, TRIG, HDL, CHOLHDL, VLDL, LDLCALC, LDLDIRECT  Additional studies/ records that were reviewed today include:   Coronary CT: 02/2021 Aorta: Mild ascending aortic aneurysm with maximum diameter 42 mm. Mild diffuse atherosclerotic plaque, no dissection.   Aortic Valve: Trileaflet with mildly thickened and calcified leaflets.   Coronary Arteries:  Normal coronary origin.  Right dominance.   RCA is a large dominant artery that gives rise to PDA and PLA. There is moderate mixed plaque in the ostial RCA with high risk features (napkin ring sign) and stenosis 50-69%. This is followed by a long calcified plaque in the proximal RCA with stenosis 50-69%. Mid and distal RCA has mild diffuse calcified plaque with stenosis 25-50%.   PLA has minimal plaque.   PDA is a small lumen artery that has severe calcified plaque in its mid portion with stenosis > 70%.   Left main is a large artery that gives rise to LAD and LCX arteries. Left main has mild calcified plaque with stenosis 0-25%.   LAD is a large vessel that is heavily calcified. Ostial/proximal LAD has moderate almost circumferential calcified plaque with stenosis 50-69%. Mid LAD has severe long calcified plaque with stenosis 50-69% but stenosis > 70% can't be excluded. Distal LAD has mild calcified plaque with stenosis 25-49%.   D1 is a small lumen artery with mild calcified plaque in its mid portion and stenosis 25-49%.   LCX is a medium size non-dominant artery that gives rise to one large OM1 Greene. There is moderate calcified plaque in the proximal LCX artery with stenosis 50-69%. Mid OM1 has mild calcified plaque with stenosis 25-49%.   Other  findings:   Normal pulmonary vein drainage into the left atrium.   Normal left atrial appendage without a thrombus.   IMPRESSION: 1. Coronary calcium score of  2505. This was 79 percentile for age and sex matched control.   2. Normal coronary origin with right dominance.   3. CAD-RADS 4. Moderate diffuse heavily calcified plaque in the proximal RCA, proximal and mid LAD and in the proximal LCX arteries. 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 Aggressive risk factor modification is recommended.   4. There is a small a pseudoaneurysm at the inferior portion of the left ventricular apex measuring 12 x 10 mm.   5. Mild ascending aortic aneurysm with maximum diameter 42 mm. Mild diffuse atherosclerotic plaque, no dissection.   6. 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   1. Left Main: 0.97.   2. LAD: Proximal: 0.96, mid: 0.94, distal: 0.85. 3. LCX: 0.96. 4. OM1: 0.95 5. RCA: Not analyzed secondary to significant motion (the patient in atrial fibrillation).   IMPRESSION: 1. CT FFR analysis didn't show any significant stenosis in the LAD and LCX arteries. RCA was not analyzed secondary to significant motion while the patient was in atrial fibrillation with variable heart rate. Aggressive medical management is recommended.     Assessment:    1. Coronary artery disease involving native coronary artery of native heart without angina pectoris   2. Persistent atrial fibrillation ([place])   3. Mass of upper lobe of left lung   4. OSA (obstructive sleep apnea)   5. Aneurysm of ascending aorta without rupture   6. Essential hypertension      Plan:   In order of problems listed above:  1. CAD - Recent Coronary CT in 02/2021 showed moderate plaque along the proximal RCA, proximal and mid LAD and proximal LCx with severe plaque in a small PDA with medical  management recommended. - He has baseline dyspnea but denies any progression of symptoms and no recent chest pain.  - Continue Toprol-XL 25mg  daily and Simvastatin 40mg  daily (LDL at 75 in 05/2021). No ASA given the need for anticoagulation.   2. Persistent Atrial Fibrillation - He denies any recent palpitations and his HR is well-controlled in the 70's today. Continue Toprol-XL for rate-control. - No reports of active bleeding. He remains on Xarelto 20mg  daily for anticoagulation which is the appropriate dose at this time given his calculated creatinine clearance of  74 mL/min.   3. 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 Was noted again on repeat CT in 07/2021 and he is scheduled for follow-up imaging next month.   4. OSA - He reports good compliance with his CPAP at night.   5. Ascending thoracic aortic aneurysm - This was at 4.5 cm by imaging in 08/2020, 4.2 cm in 02/2021 and at 4.3 cm in 07/2021. Continue with plans for annual imaging.   6. HTN - BP initially elevated but rechecked and improved to 132/78 on repeat check. He does bring with him a BP log today and his SBP has mostly been in the 120's to 130's at home. Continue current medication regimen.    Medication Adjustments/Labs and Tests Ordered: Current medicines are reviewed at length with the patient today.  Concerns regarding medicines are outlined above.  Medication changes, Labs and Tests ordered today are listed in the Patient Instructions below. Patient Instructions  Medication Instructions:  Your physician recommends that you continue on your current  medications as directed. Please refer to the Current Medication list given to you today.  *If you need a refill on your cardiac medications before your next appointment, please call your pharmacy*   Lab Work: None If you  have labs (blood work) drawn today and your tests are completely normal, you will receive your results only by: [place] (if you have MyChart) OR A paper copy in the mail If you have any lab test that is abnormal or we need to change your treatment, we will call you to review the results.   Testing/Procedures: None   Follow-Up: At [facility], you and your health needs are our priority.  As part of our continuing mission to provide you with exceptional heart care, we have created designated Provider Care Teams.  These Care Teams include your primary Cardiologist (physician) and Advanced Practice Providers (APPs -  Physician Assistants and Nurse Practitioners) who all work together to provide you with the care you need, when you need it.  We recommend signing up for the patient portal called "MyChart".  Sign up information is provided on this After Visit Summary.  MyChart is used to connect with patients for Virtual Visits (Telemedicine).  Patients are able to view lab/test results, encounter notes, upcoming appointments, etc.  Non-urgent messages can be sent to your provider as well.   To learn more about what you can do with MyChart, go to [email or web address].    Your next appointment:   6 month(s)  The format for your next appointment:   In Person  Provider:   You may see Jonathan Dolly, MD or one of the following Advanced Practice Providers on your designated Care Team:   Bernerd Pho, PA-C 1}    Other Instructions      Signed, Waynetta Pean  11/23/2021 7:40 PM    [facility]. [address] [place], [place] [address] Phone: [phone] Fax: [phone]

## 2021-11-23 NOTE — Patient Instructions (Signed)
Medication Instructions:  Your physician recommends that you continue on your current medications as directed. Please refer to the Current Medication list given to you today.  *If you need a refill on your cardiac medications before your next appointment, please call your pharmacy*   Lab Work: None If you have labs (blood work) drawn today and your tests are completely normal, you will receive your results only by: [place] (if you have MyChart) OR A paper copy in the mail If you have any lab test that is abnormal or we need to change your treatment, we will call you to review the results.   Testing/Procedures: None   Follow-Up: At [facility], you and your health needs are our priority.  As part of our continuing mission to provide you with exceptional heart care, we have created designated Provider Care Teams.  These Care Teams include your primary Cardiologist (physician) and Advanced Practice Providers (APPs -  Physician Assistants and Nurse Practitioners) who all work together to provide you with the care you need, when you need it.  We recommend signing up for the patient portal called "MyChart".  Sign up information is provided on this After Visit Summary.  MyChart is used to connect with patients for Virtual Visits (Telemedicine).  Patients are able to view lab/test results, encounter notes, upcoming appointments, etc.  Non-urgent messages can be sent to your provider as well.   To learn more about what you can do with MyChart, go to [email or web address].    Your next appointment:   6 month(s)  The format for your next appointment:   In Person  Provider:   You may see Carlyle Dolly, MD or one of the following Advanced Practice Providers on your designated Care Team:   Bernerd Pho, PA-C 1}    Other Instructions

## 2021-11-30 ENCOUNTER — Encounter: Payer: Self-pay | Admitting: *Deleted

## 2021-11-30 ENCOUNTER — Other Ambulatory Visit: Payer: Self-pay

## 2021-11-30 ENCOUNTER — Encounter: Payer: Self-pay | Admitting: Gastroenterology

## 2021-11-30 ENCOUNTER — Ambulatory Visit: Payer: Medicare Other | Admitting: Gastroenterology

## 2021-11-30 VITALS — BP 131/89 | HR 109 | Temp 97.3°F | Ht 75.0 in | Wt 234.0 lb

## 2021-11-30 DIAGNOSIS — R7989 Other specified abnormal findings of blood chemistry: Secondary | ICD-10-CM | POA: Diagnosis not present

## 2021-11-30 NOTE — Patient Instructions (Signed)
We are arranging an ultrasound of your liver.  I have also ordered blood work to be done, so we can further test reasons your liver number may be elevated.  Further recommendations to follow!   It was a pleasure to see you today. I want to create trusting relationships with patients to provide genuine, compassionate, and quality care. I value your feedback. If you receive a survey regarding your visit,  I greatly appreciate you taking time to fill this out.   Annitta Needs, PhD, ANP-BC [facility]

## 2021-11-30 NOTE — Progress Notes (Signed)
Referring Provider: Pablo Lawrence, NP Primary Care Physician:  Pablo Lawrence, NP Primary GI: Dr. Gala Romney  Chief Complaint  Patient presents with   Elevated Hepatic Enzymes    HPI:   Jonathan Greene is a 80 y.o. male presenting today with a history of radiation proctitis s/p APC treatment in 2015, chronic GERD, and now referred by PCP due to elevated LFTs.   He has had mildly elevated transaminases dating back at least 5 years. No recent abdominal imaging.   No herbal supplements. Takes Vit B and D3 OTC. 4 ounces of bourbon a day. Rare that drinks more than this. No blood transfusions. No Tattoos. 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 When he was young, states he had jaundice. No overt GI bleeding. No abdominal pain. No changes in bowel habits. No mental status changes or confusion. No pruritis. Has been on simvastatin for many years.   Past Medical History:  Diagnosis Date   A-fib (HCC)    Bradycardia    COPD (chronic obstructive pulmonary disease) (HCC)    Frequent urination    GERD (gastroesophageal reflux disease)    Hypercholesterolemia    Hypertension    MI (myocardial infarction) ([place])    Prostate cancer ([place])    RADIATION TX AND SCHEDULED FOR SEED IMPLANTS 02-28-12   Sleep apnea    Stroke ([place])    2011;ST memory loss, no other deficits.   Urinary urgency     Past Surgical History:  Procedure Laterality Date   CATARACT EXTRACTION W/PHACO Right 11/06/2013   Procedure: CATARACT EXTRACTION PHACO AND INTRAOCULAR LENS PLACEMENT (IOC);  Surgeon: Tonny Branch, MD;  Location: [facility];  Service: Ophthalmology;  Laterality: Right;  CDE 11.67   CATARACT EXTRACTION W/PHACO Left 11/25/2015   Procedure: CATARACT EXTRACTION PHACO AND INTRAOCULAR LENS PLACEMENT ; CDE:  8.15;  Surgeon: Tonny Branch, MD;  Location: [facility];  Service: Ophthalmology;  Laterality: Left;   COLONOSCOPY  10/03/2006   [id number] rectal polyps cold biopsied/removed, otherwise normal  rectum/Sigmoid diverticula.  Remainder of colon mucosa appeared normal   COLONOSCOPY N/A 08/19/2014   RMR: Radiation proctitis-status post APC ablation. Colonic diverticulosis.   CYSTOSCOPY  02/28/2012   Procedure: CYSTOSCOPY;  Surgeon: Bernestine Amass, MD;  [facility];  Service: Urology;  Laterality: N/A;   no seeds noted in bladder   RADIOACTIVE SEED IMPLANT  02/28/2012   Procedure: RADIOACTIVE SEED IMPLANT;  Surgeon: Bernestine Amass, MD;  Location: [facility];  Service: Urology;  Laterality: N/A;  67seeds implanted     Current Outpatient Medications  Medication Sig Dispense Refill   albuterol (PROVENTIL) (2.5 MG/3ML) 0.083% nebulizer solution Take 3 mLs (2.5 mg total) by nebulization every 6 (six) hours as needed for wheezing or shortness of breath. 75 mL 5   albuterol (VENTOLIN HFA) 108 (90 Base) MCG/ACT inhaler Inhale 2 puffs into the lungs every 6 (six) hours as needed for wheezing or shortness of breath. 6.7 g 5   benzonatate (TESSALON) 200 MG capsule Take 1 capsule (200 mg total) by mouth 3 (three) times daily as needed for cough. 30 capsule 1   BREZTRI AEROSPHERE 160-9-4.8 MCG/ACT AERO Inhale 2 puffs into the lungs 2 (two) times daily.     escitalopram (LEXAPRO) 10 MG tablet Take 5 mg by mouth daily.     metoprolol succinate (TOPROL XL) 25 MG 24 hr tablet Take 1 tablet (25 mg total) by mouth daily. 30 tablet 11  Multiple Vitamin (MULTIVITAMIN ADULT) TABS Take 1 tablet by mouth daily.     omeprazole (PRILOSEC) 20 MG capsule Take 20 mg by mouth daily.     predniSONE (DELTASONE) 20 MG tablet Take 1 tablet (20 mg total) by mouth daily with breakfast. 5 tablet 0   rivaroxaban (XARELTO) 20 MG TABS tablet Take 1 tablet (20 mg total) by mouth daily with supper. 90 tablet 4   simvastatin (ZOCOR) 40 MG tablet Take 40 mg by mouth at bedtime.      solifenacin (VESICARE) 10 MG tablet Take 10 mg by mouth daily.      tamsulosin (FLOMAX) 0.4 MG CAPS capsule Take  0.4 mg by mouth daily.     Current Facility-Administered Medications  Medication Dose Route Frequency Provider Last Rate Last Admin   albuterol (PROVENTIL) (2.5 MG/3ML) 0.083% nebulizer solution 2.5 mg  2.5 mg Nebulization Once Parrett, Tammy S, NP        Allergies as of 11/30/2021   (No Known Allergies)    Family History  Problem Relation Age of Onset   Stroke Mother    Hypertension Mother    Colon cancer Sister        in her 26s   Breast cancer Sister    COPD Sister    Hypertension Sister    Pneumonia Sister    Heart attack Brother    COPD Son    COPD Maternal Aunt    Cancer Neg Hx    Liver disease Neg Hx     Social History   Socioeconomic History   Marital status: Married    Spouse name: Vaughan Basta   Number of children: 2   Years of education: Not on file   Highest education level: Not on file  Occupational History   Occupation: retired  Tobacco Use   Smoking status: Former    Packs/day: 2.00    Years: 35.00    Pack years: 70.00    Types: Cigarettes    Quit date: 02/21/1991    Years since quitting: 30.7   Smokeless tobacco: Current    Types: Chew   Tobacco comments:    CHEW TOBACCO FOR 20 YRS   Vaping Use   Vaping Use: Never used  Substance and Sexual Activity   Alcohol use: Yes    Alcohol/week: 14.0 standard drinks    Types: 14 Shots of liquor per week    Comment: 4 ounces daily   Drug use: No   Sexual activity: Yes    Birth control/protection: None  Other Topics Concern   Not on file  Social History Narrative   Not on file   Social Determinants of Health   Financial Resource Strain: Not on file  Food Insecurity: Not on file  Transportation Needs: Not on file  Physical Activity: Not on file  Stress: Not on file  Social Connections: Not on file    Review of Systems: Gen: Denies fever, chills, anorexia. Denies fatigue, weakness, weight loss.  CV: Denies chest pain, palpitations, syncope, peripheral edema, and claudication. Resp: Denies dyspnea  at rest, cough, wheezing, coughing up blood, and pleurisy. GI:see HPI Derm: Denies rash, itching, dry skin Psych: Denies depression, anxiety, memory loss, confusion. No homicidal or suicidal ideation.  Heme: Denies bruising, bleeding, and enlarged lymph nodes.  Physical Exam: BP 131/89    Pulse (!) 109    Temp (!) 97.3 F (36.3 C) (Temporal)    Ht 6\' 3"  (1.905 m)    Wt 234 lb (106.1 kg)  BMI 29.25 kg/m  General:   Alert and oriented. No distress noted. Pleasant and cooperative.  Head:  Normocephalic and atraumatic. Eyes:  Conjuctiva clear without scleral icterus. Mouth:  mask in place Abdomen:  +BS, soft, non-tender and non-distended. No rebound or guarding. No HSM or masses noted. Msk:  Symmetrical without gross deformities. Normal posture. Extremities:  Without edema. Neurologic:  Alert and  oriented x4 Psych:  Alert and cooperative. Normal mood and affect.  ASSESSMENT/PLAN: Jonathan Greene is a 80 y.o. male presenting today at the request of PCP due to 80 y.o. male presenting today with a history of radiation proctitis s/p APC treatment in 2015, chronic GERD, and now referred by PCP due to elevated LFTs.   Mildly elevated transaminases: dating back at least 5 years from what we have on file. Very mildly elevated bilirubin intermittently. He does have exposure to cocaine greater than 30 years ago but no other risk factors for viral hepatitis; he also does report some type of jaundice as a child. Alcohol use daily likely playing a role, along with med effect from statin.   Will start with viral hepatitis serologies and iron studies. Holding off on extensive evaluation but may need depending on this first step.  Arranging US abdomen.  I discussed ETOH cessation as well. He states he understands this clearly, but he will not stop.   Further recommendations to follow.  Annitta Needs, PhD, ANP-BC [facility]

## 2021-12-01 LAB — COMPLETE METABOLIC PANEL WITH GFR
AG Ratio: 1.3 (calc) (ref 1.0–2.5)
ALT: 69 U/L — ABNORMAL HIGH (ref 9–46)
AST: 81 U/L — ABNORMAL HIGH (ref 10–35)
Albumin: 4.1 g/dL (ref 3.6–5.1)
Alkaline phosphatase (APISO): 71 U/L (ref 35–144)
BUN/Creatinine Ratio: 12 (calc) (ref 6–22)
BUN: 16 mg/dL (ref 7–25)
CO2: 24 mmol/L (ref 20–32)
Calcium: 10.1 mg/dL (ref 8.6–10.3)
Chloride: 105 mmol/L (ref 98–110)
Creat: 1.34 mg/dL — ABNORMAL HIGH (ref 0.70–1.28)
Globulin: 3.2 g/dL (calc) (ref 1.9–3.7)
Glucose, Bld: 99 mg/dL (ref 65–99)
Potassium: 4.4 mmol/L (ref 3.5–5.3)
Sodium: 143 mmol/L (ref 135–146)
Total Bilirubin: 1 mg/dL (ref 0.2–1.2)
Total Protein: 7.3 g/dL (ref 6.1–8.1)
eGFR: 54 mL/min/{1.73_m2} — ABNORMAL LOW (ref >=60)

## 2021-12-01 LAB — CBC WITH DIFFERENTIAL/PLATELET
Absolute Monocytes: 635 cells/uL (ref 200–950)
Basophils Absolute: 46 cells/uL (ref 0–200)
Basophils Relative: 0.5 %
Eosinophils Absolute: 147 cells/uL (ref 15–500)
Eosinophils Relative: 1.6 %
HCT: 49.5 % (ref 38.5–50.0)
Hemoglobin: 16.2 g/dL (ref 13.2–17.1)
Lymphs Abs: 1877 cells/uL (ref 850–3900)
MCH: 31.6 pg (ref 27.0–33.0)
MCHC: 32.7 g/dL (ref 32.0–36.0)
MCV: 96.5 fL (ref 80.0–100.0)
MPV: 12.2 fL (ref 7.5–12.5)
Monocytes Relative: 6.9 %
Neutro Abs: 6495 cells/uL (ref 1500–7800)
Neutrophils Relative %: 70.6 %
Platelets: 170 10*3/uL (ref 140–400)
RBC: 5.13 10*6/uL (ref 4.20–5.80)
RDW: 12.4 % (ref 11.0–15.0)
Total Lymphocyte: 20.4 %
WBC: 9.2 10*3/uL (ref 3.8–10.8)

## 2021-12-01 LAB — HEPATITIS A ANTIBODY, TOTAL: Hepatitis A AB,Total: REACTIVE — AB

## 2021-12-01 LAB — IRON,TIBC AND FERRITIN PANEL
%SAT: 40 % (calc) (ref 20–48)
Ferritin: 83 ng/mL (ref 24–380)
Iron: 154 ug/dL (ref 50–180)
TIBC: 389 mcg/dL (calc) (ref 250–425)

## 2021-12-01 LAB — HEPATITIS C ANTIBODY
Hepatitis C Ab: NONREACTIVE
SIGNAL TO CUT-OFF: 0.06 (ref <1.00)

## 2021-12-01 LAB — HEPATITIS B SURFACE ANTIBODY,QUALITATIVE: Hep B S Ab: NONREACTIVE

## 2021-12-01 LAB — HEPATITIS B SURFACE ANTIGEN: Hepatitis B Surface Ag: NONREACTIVE

## 2021-12-01 LAB — HEPATITIS B CORE ANTIBODY, TOTAL: Hep B Core Total Ab: NONREACTIVE

## 2021-12-06 ENCOUNTER — Other Ambulatory Visit: Payer: Self-pay

## 2021-12-06 ENCOUNTER — Ambulatory Visit: Payer: Medicare Other | Admitting: Gastroenterology

## 2021-12-06 ENCOUNTER — Ambulatory Visit (HOSPITAL_COMMUNITY)
Admission: RE | Admit: 2021-12-06 | Discharge: 2021-12-06 | Disposition: A | Discharge status: Home / Self Care | Payer: Medicare Other | Source: Ambulatory Visit | Attending: Hematology | Admitting: Hematology

## 2021-12-06 ENCOUNTER — Encounter (HOSPITAL_COMMUNITY): Payer: Self-pay | Admitting: Radiology

## 2021-12-06 DIAGNOSIS — R918 Other nonspecific abnormal finding of lung field: Secondary | ICD-10-CM | POA: Insufficient documentation

## 2021-12-06 MED ORDER — IOHEXOL 300 MG/ML  SOLN
100.0000 mL | Freq: Once | INTRAMUSCULAR | Status: AC | PRN
  Administered 2021-12-06: 75 mL via INTRAVENOUS

## 2021-12-07 ENCOUNTER — Other Ambulatory Visit: Payer: Self-pay

## 2021-12-07 ENCOUNTER — Ambulatory Visit: Payer: Medicare Other | Admitting: Pulmonary Disease

## 2021-12-07 ENCOUNTER — Encounter: Payer: Self-pay | Admitting: Pulmonary Disease

## 2021-12-07 VITALS — BP 118/72 | HR 70 | Temp 97.9°F | Ht 75.0 in | Wt 236.0 lb

## 2021-12-07 DIAGNOSIS — J432 Centrilobular emphysema: Secondary | ICD-10-CM | POA: Diagnosis not present

## 2021-12-07 DIAGNOSIS — Z9989 Dependence on other enabling machines and devices: Secondary | ICD-10-CM

## 2021-12-07 DIAGNOSIS — G4733 Obstructive sleep apnea (adult) (pediatric): Secondary | ICD-10-CM | POA: Diagnosis not present

## 2021-12-07 DIAGNOSIS — R911 Solitary pulmonary nodule: Secondary | ICD-10-CM | POA: Diagnosis not present

## 2021-12-07 NOTE — Patient Instructions (Signed)
Follow up in 6 months 

## 2021-12-07 NOTE — Progress Notes (Signed)
[facility]  Chief Complaint  Patient presents with   Follow-up    Patient feels like his breathing is doing better since last visit.     Constitutional:  BP 118/72 (BP Location: Left Arm, Patient Position: Sitting, Cuff Size: Normal)    Pulse 70    Temp 97.9 F (36.6 C) (Oral)    Ht 6\' 3"  (1.905 m)    Wt 236 lb (107 kg)    SpO2 98%    BMI 29.50 kg/m   Past Medical History:  A fib, GERD, HLD, HTN, Prostate cancer  Past Surgical History:  His  has a past surgical history that includes Radioactive seed implant (02/28/2012); Cystoscopy (02/28/2012); Cataract extraction w/PHACO (Right, 11/06/2013); Colonoscopy (10/03/2006); Colonoscopy (N/A, 08/19/2014); and Cataract extraction w/PHACO (Left, 11/25/2015).  Brief Summary:  Jonathan Greene is a 81 y.o. 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      Subjective:   He is here with his daughter.  He saw Tammy Parrett in November.  Found to have Klebsiella in his sputum.  Treated with antibiotics.  He saw Dr. Shearon Stalls in December for hemoptysis.  This is resolved.  Breathing is better than it has been for months.  He is using breztri and this helps.  Not having cough, wheeze, or sputum.  Using CPAP nightly.  No issues with mask or pressure.  CT chest showed stable LUL lesion, emphysema, and aneurysm.  Physical Exam:   Appearance - well kempt   ENMT - no sinus tenderness, no oral exudate, no LAN, Mallampati 3 airway, no stridor  Respiratory - equal breath sounds bilaterally, no wheezing or rales  CV - s1s2 regular rate and rhythm, no murmurs  Ext - no clubbing, no edema  Skin - no rashes  Psych - normal mood and affect    Pulmonary testing:  PFT 06/30/20 >> FEV1 3.50 (99%), FEV1% 77, TLC 7.09 (90%), DLCO 51% Lt upper lobe core needle biopsy 07/01/20 >> benign lung with inflammation and  fibrosis  Chest Imaging:  CT chest 06/03/20 >> atherosclerosis, aortic root 4.3 cm, severe centrilobular emphysema, LUL subpleural mass like consolidation 4.4 x 2.3 x 2.2 cm  PET scan 06/16/20 >> 2.4 x 2.3 cm pleural based LUL nodule 6.2 SUV, 6 mm AP window node 4.2 SUV, ascending aorta 4.1 cm, centrilobular and paraseptal emphysema PET scan 08/16/20 >> decreased size and SUV of LUL 2.2 x 1.2 cm mass, AP window LAN resolved CT chest 07/18/21 >> ascending aorta 4.3 cm, atherosclerosis, mod emphysema, new LUL patchy consolidation, plaque like lesion LUL CT chest 12/06/21 >> 4.4 cm ascending thoracic aorta, severe emphysema, 2.4 x 0.4 cm mass LUL  Sleep Tests:  Auto CPAP 09/08/21 to 12/06/21 >> used on 86 of 90 nights with average 6 hrs 31 min.  Average AHI 1.9 with median CPAP 7 and 95 th percentile CPAP 10 cm H2O  Cardiac Tests:  Echo 06/30/20 >> EF 55 to 60%, mod LVH  Social History:  He  reports that he quit smoking about 30 years ago. His smoking use included cigarettes. He has a 70.00 pack-year smoking history. His smokeless tobacco use includes chew. He reports current alcohol use of about 14.0 standard drinks per week. He reports that he does not use drugs.  Family History:  His family history includes Breast cancer in his sister; COPD in his maternal aunt, sister, and  son; Colon cancer in his sister; Heart attack in his brother; Hypertension in his mother and sister; Pneumonia in his sister; Stroke in his mother.     Assessment/Plan:   Lt upper lobe mass like consolidation seen on CT chest 06/03/20. - biopsy from July 2021 showed inflammation and fibrosis - stable on CT chest from January 2023 - will need follow up CT in January 2024  Centrilobular emphysema with extensive history of smoking. - continue breztri - prn albuterol - discussed different roles for his inhalers   Obstructive sleep apnea. 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 - followed by Dr. Harl Bowie with cardiology  Time Spent Involved in Patient Care on Day of Examination:  36 minutes  Follow up:   Patient Instructions  Follow up in 6 months  Medication List:   Allergies as of 12/07/2021   No Known Allergies      Medication List        Accurate as of December 07, 2021 12:02 PM. If you have any questions, ask your nurse or doctor.          STOP taking these medications    benzonatate 200 MG capsule Commonly known as: TESSALON Stopped by: Chesley Mires, MD   predniSONE 20 MG tablet Commonly known as: DELTASONE Stopped by: Chesley Mires, MD       TAKE these medications    albuterol 108 (90 Base) MCG/ACT inhaler Commonly known as: VENTOLIN HFA Inhale 2 puffs into the lungs every 6 (six) hours as needed for wheezing or shortness of breath.   albuterol (2.5 MG/3ML) 0.083% nebulizer solution Commonly known as: PROVENTIL Take 3 mLs (2.5 mg total) by nebulization every 6 (six) hours as needed for wheezing or shortness of breath.   Breztri Aerosphere 160-9-4.8 MCG/ACT Aero Generic drug: Budeson-Glycopyrrol-Formoterol Inhale 2 puffs into the lungs 2 (two) times daily.   escitalopram 10 MG tablet Commonly known as: LEXAPRO Take 5 mg by mouth daily.   metoprolol succinate 25 MG 24 hr tablet Commonly known as: Toprol XL Take 1 tablet (25 mg total) by mouth daily.   Multivitamin Adult Tabs Take 1 tablet by mouth daily.   omeprazole 20 MG capsule Commonly known as: PRILOSEC Take 20 mg by mouth daily.   rivaroxaban 20 MG Tabs tablet Commonly known as: XARELTO Take 1 tablet (20 mg total) by mouth daily with supper.   simvastatin 40 MG tablet Commonly known as: ZOCOR Take 40 mg by mouth at bedtime.   solifenacin 10 MG tablet Commonly known as:  VESICARE Take 10 mg by mouth daily.   tamsulosin 0.4 MG Caps capsule Commonly known as: FLOMAX Take 0.4 mg by mouth daily.        Signature:  Chesley Mires, MD [facility] Pager - [phone] 12/07/2021, 12:02 PM

## 2021-12-09 ENCOUNTER — Ambulatory Visit (HOSPITAL_COMMUNITY)
Admission: RE | Admit: 2021-12-09 | Discharge: 2021-12-09 | Disposition: A | Discharge status: Home / Self Care | Payer: Medicare Other | Source: Ambulatory Visit | Attending: Gastroenterology | Admitting: Gastroenterology

## 2021-12-09 ENCOUNTER — Other Ambulatory Visit: Payer: Self-pay

## 2021-12-09 DIAGNOSIS — R7989 Other specified abnormal findings of blood chemistry: Secondary | ICD-10-CM | POA: Diagnosis present

## 2021-12-12 ENCOUNTER — Other Ambulatory Visit: Payer: Self-pay

## 2021-12-12 DIAGNOSIS — R7989 Other specified abnormal findings of blood chemistry: Secondary | ICD-10-CM

## 2022-01-18 DIAGNOSIS — F331 Major depressive disorder, recurrent, moderate: Secondary | ICD-10-CM | POA: Insufficient documentation

## 2022-01-18 DIAGNOSIS — N401 Enlarged prostate with lower urinary tract symptoms: Secondary | ICD-10-CM | POA: Insufficient documentation

## 2022-01-18 DIAGNOSIS — E782 Mixed hyperlipidemia: Secondary | ICD-10-CM | POA: Insufficient documentation

## 2022-01-18 DIAGNOSIS — G8929 Other chronic pain: Secondary | ICD-10-CM | POA: Insufficient documentation

## 2022-01-18 DIAGNOSIS — K219 Gastro-esophageal reflux disease without esophagitis: Secondary | ICD-10-CM | POA: Insufficient documentation

## 2022-01-19 ENCOUNTER — Ambulatory Visit (HOSPITAL_COMMUNITY): Payer: Medicare Other

## 2022-01-19 ENCOUNTER — Other Ambulatory Visit (HOSPITAL_COMMUNITY): Payer: Medicare Other

## 2022-01-24 ENCOUNTER — Ambulatory Visit (HOSPITAL_COMMUNITY): Payer: Medicare Other | Admitting: Hematology

## 2022-01-26 NOTE — Progress Notes (Signed)
Following up with Dr. Halford Chessman

## 2022-03-09 LAB — HEPATIC FUNCTION PANEL
AG Ratio: 1.2 (calc) (ref 1.0–2.5)
ALT: 62 U/L — ABNORMAL HIGH (ref 9–46)
AST: 76 U/L — ABNORMAL HIGH (ref 10–35)
Albumin: 4 g/dL (ref 3.6–5.1)
Alkaline phosphatase (APISO): 69 U/L (ref 35–144)
Bilirubin, Direct: 0.3 mg/dL — ABNORMAL HIGH (ref 0.0–0.2)
Globulin: 3.4 g/dL (calc) (ref 1.9–3.7)
Indirect Bilirubin: 0.9 mg/dL (calc) (ref 0.2–1.2)
Total Bilirubin: 1.2 mg/dL (ref 0.2–1.2)
Total Protein: 7.4 g/dL (ref 6.1–8.1)

## 2022-03-18 ENCOUNTER — Ambulatory Visit (INDEPENDENT_AMBULATORY_CARE_PROVIDER_SITE_OTHER): Payer: Medicare Other

## 2022-03-18 ENCOUNTER — Ambulatory Visit
Admission: EM | Admit: 2022-03-18 | Discharge: 2022-03-18 | Disposition: A | Discharge status: Home / Self Care | Payer: Medicare Other | Attending: Urgent Care | Admitting: Urgent Care

## 2022-03-18 DIAGNOSIS — W19XXXA Unspecified fall, initial encounter: Secondary | ICD-10-CM

## 2022-03-18 DIAGNOSIS — M79641 Pain in right hand: Secondary | ICD-10-CM | POA: Diagnosis not present

## 2022-03-18 DIAGNOSIS — S50311A Abrasion of right elbow, initial encounter: Secondary | ICD-10-CM | POA: Diagnosis not present

## 2022-03-18 DIAGNOSIS — M25521 Pain in right elbow: Secondary | ICD-10-CM

## 2022-03-18 DIAGNOSIS — M79631 Pain in right forearm: Secondary | ICD-10-CM

## 2022-03-18 MED ORDER — ACETAMINOPHEN 500 MG PO TABS: 1000.0000 mg | ORAL_TABLET | Freq: Four times a day (QID) | ORAL | 0 refills | Status: DC | PRN

## 2022-03-18 MED ORDER — AQUAPHOR EX OINT: TOPICAL_OINTMENT | CUTANEOUS | 0 refills | Status: DC | PRN

## 2022-03-18 NOTE — Discharge Instructions (Addendum)
You were seen today after your fall off of the scooter for an elbow abrasion and to rule out any acute fractures to your right arm.  ? ?Your x-rays today were negative and your physical exam is reassuring. Please follow-up with your primary care provider in 1 week for a wound check for your elbow abrasion. I would like them to confirm that this wound is healing properly and that you do not need antibiotics. If you notice increased redness, swelling, pus drainage, or fever, please seek care at primary care or urgent care. ? ?I have sent some ointment (Aquaphor) to the pharmacy for you to pick up. Please apply a thin layer of this ointment to your abrasion twice daily when you change your bandage. This will help promote a good environment for wound healing. Your wound was cleansed today, so next dressing change will be tomorrow morning.  ? ?Please return if you develop numbness/tingling to right arm, increased right arm swelling/pain, and/or fever/signs of infection to your elbow abrasion. ? ?Please avoid taking NSAIDS (Aleve, naproxen, ibuprofen, motrin, Advil, etc) since you are on a blood thinning medication called Xarelto. I have prescribed Tylenol for you to take for pain while you recover from this. Take 1,000mg  every 6 hours and do not take more than 4,000mg  in 24 hours.  ?

## 2022-03-18 NOTE — ED Provider Notes (Signed)
?[place] [facility] ? ? ? ?CSN: [id number] ?Arrival date & time: 03/18/22  1326 ? ? ?  ? ?History   ?Chief Complaint ?Chief Complaint  ?Patient presents with  ? Fall  ? Elbow Pain  ? Arm Pain  ? Hand Pain  ? ? ?HPI ?Jonathan Greene is a 81 y.o. male.  ? ?Patient was riding his grandson's scooter when he fell to his right side and his right elbow broke his fall 4-5 days ago. Patient states arm didn't swell right away, swelling started 24-48 hours later. Denies pain at this time. Right arm is discolored and ecchymotic. Patient reports small amount of bleeding from abrasion to right elbow that was well controlled after fall. Denies headache, fever, neck pain, right wrist pain, or right hand pain. Patient takes xarelto for his a-fib. Denies hitting his head and LOC. States he took one Aleve tablet this morning in an attempt to reduce the swelling in his right arm. He has not iced or elevated his right upper extremity since the fall. Abrasion is dressed with bandid and denies purulent drainage.  ? ? ?Fall ? ?Arm Pain ? ?Hand Pain ? ? ?Past Medical History:  ?Diagnosis Date  ? A-fib ([place])   ? Bradycardia   ? COPD (chronic obstructive pulmonary disease) ([place])   ? Frequent urination   ? GERD (gastroesophageal reflux disease)   ? Hypercholesterolemia   ? Hypertension   ? MI (myocardial infarction) ([place])   ? Prostate cancer ([place])   ? RADIATION TX AND SCHEDULED FOR SEED IMPLANTS 02-28-12  ? Sleep apnea   ? Stroke [facility])   ? 2011;ST memory loss, no other deficits.  ? Urinary urgency   ? ? ?Patient Active Problem List  ? Diagnosis Date Noted  ? COPD with acute exacerbation ([place]) 09/30/2021  ? CAP (community acquired pneumonia) 09/30/2021  ? Hemoptysis 09/30/2021  ? Mass of upper lobe of left lung 06/08/2020  ? Diarrhea 12/31/2019  ? Melena 12/31/2019  ? Essential hypertension 12/11/2016  ? 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 Emphysema of lung ([place]) 12/11/2016  ? A-fib ([place]) 02/11/2015  ? Hematochezia 08/04/2014  ? Prostate cancer  ([place]) 12/07/2011  ? ? ?Past Surgical History:  ?Procedure Laterality Date  ? CATARACT EXTRACTION W/PHACO Right 11/06/2013  ? Procedure: CATARACT EXTRACTION PHACO AND INTRAOCULAR LENS PLACEMENT (IOC);  Surgeon: Tonny Branch, MD;  Location: [facility];  Service: Ophthalmology;  Laterality: Right;  CDE 11.67  ? CATARACT EXTRACTION W/PHACO Left 11/25/2015  ? Procedure: CATARACT EXTRACTION PHACO AND INTRAOCULAR LENS PLACEMENT ; CDE:  8.15;  Surgeon: Tonny Branch, MD;  Location: [facility];  Service: Ophthalmology;  Laterality: Left;  ? COLONOSCOPY  10/03/2006  ? [id number] rectal polyps cold biopsied/removed, otherwise normal rectum/Sigmoid diverticula.  Remainder of colon mucosa appeared normal  ? COLONOSCOPY N/A 08/19/2014  ? RMR: Radiation proctitis-status post APC ablation. Colonic diverticulosis.  ? CYSTOSCOPY  02/28/2012  ? Procedure: CYSTOSCOPY;  Surgeon: Bernestine Amass, MD;  Location: [facility];  Service: Urology;  Laterality: N/A;   no seeds noted in bladder  ? RADIOACTIVE SEED IMPLANT  02/28/2012  ? Procedure: RADIOACTIVE SEED IMPLANT;  Surgeon: Bernestine Amass, MD;  Location: [facility];  Service: Urology;  Laterality: N/A;  67seeds implanted   ? ? ? ? ? ?Home Medications   ? ?Prior to Admission medications   ?Medication Sig Start Date End Date Taking? Authorizing Provider  ?acetaminophen (TYLENOL) 500 MG tablet Take 2 tablets (1,000 mg total) by mouth  every 6 (six) hours as needed. 03/18/22  Yes Talbot Grumbling, FNP  ?mineral oil-hydrophilic petrolatum (AQUAPHOR) ointment Apply topically as needed for dry skin. Apply thin layer of ointment to wound bed daily with dressing changes. 03/18/22  Yes Talbot Grumbling, FNP  ?albuterol (PROVENTIL) (2.5 MG/3ML) 0.083% nebulizer solution Take 3 mLs (2.5 mg total) by nebulization every 6 (six) hours as needed for wheezing or shortness of breath. 10/06/21 10/06/22  Parrett, Fonnie Mu, NP  ?albuterol (VENTOLIN HFA) 108 (90 Base) MCG/ACT inhaler  Inhale 2 puffs into the lungs every 6 (six) hours as needed for wheezing or shortness of breath. 06/09/20   Chesley Mires, MD  ?Judithann Sauger AEROSPHERE 160-9-4.8 MCG/ACT AERO Inhale 2 puffs into the lungs 2 (two) times daily. 09/16/21   [provider]  ?escitalopram (LEXAPRO) 10 MG tablet Take 5 mg by mouth daily.    [provider]  ?metoprolol succinate (TOPROL XL) 25 MG 24 hr tablet Take 1 tablet (25 mg total) by mouth daily. 07/29/20   Erma Heritage, PA-C  ?Multiple Vitamin (MULTIVITAMIN ADULT) TABS Take 1 tablet by mouth daily.    [provider]  ?omeprazole (PRILOSEC) 20 MG capsule Take 20 mg by mouth daily.    [provider]  ?rivaroxaban (XARELTO) 20 MG TABS tablet Take 1 tablet (20 mg total) by mouth daily with supper. 03/16/15   Arnoldo Lenis, MD  ?simvastatin (ZOCOR) 40 MG tablet Take 40 mg by mouth at bedtime.  12/15/14   [provider]  ?solifenacin (VESICARE) 10 MG tablet Take 10 mg by mouth daily.  02/05/19   [provider]  ?tamsulosin (FLOMAX) 0.4 MG CAPS capsule Take 0.4 mg by mouth daily.    [provider]  ? ? ?Family History ?Family History  ?Problem Relation Age of Onset  ? Stroke Mother   ? Hypertension Mother   ? Colon cancer Sister   ?     in her 40s  ? Breast cancer Sister   ? COPD Sister   ? Hypertension Sister   ? Pneumonia Sister   ? Heart attack Brother   ? COPD Son   ? COPD Maternal Aunt   ? Cancer Neg Hx   ? Liver disease Neg Hx   ? ? ?Social History ?Social History  ? ?Tobacco Use  ? Smoking status: Former  ?  Packs/day: 2.00  ?  Years: 35.00  ?  Pack years: 70.00  ?  Types: Cigarettes  ?  Quit date: 02/21/1991  ?  Years since quitting: 31.0  ? Smokeless tobacco: Current  ?  Types: Chew  ? Tobacco comments:  ?  CHEW TOBACCO FOR 20 YRS   ?Vaping Use  ? Vaping Use: Never used  ?Substance Use Topics  ? Alcohol use: Yes  ?  Alcohol/week: 14.0 standard drinks  ?  Types: 14 Shots of liquor per week  ?  Comment: 4 ounces  daily  ? Drug use: No  ? ? ? ?Allergies   ?Patient has no known allergies. ? ? ?Review of Systems ?Review of Systems ?Per HPI ? ?Physical Exam ?Triage Vital Signs ?ED Triage Vitals  ?Enc Vitals Group  ?   BP 03/18/22 1353 131/76  ?   Pulse Rate 03/18/22 1353 79  ?   Resp 03/18/22 1353 18  ?   Temp 03/18/22 1353 98.3 ?F (36.8 ?C)  ?   Temp Source 03/18/22 1353 Oral  ?   SpO2 03/18/22 1353 97 %  ?  Weight --   ?   Height --   ?   Head Circumference --   ?   Peak Flow --   ?   Pain Score 03/18/22 1352 0  ?   Pain Loc --   ?   Pain Edu? --   ?   Excl. in [place]? --   ? ?No data found. ? ?Updated Vital Signs ?BP 131/76 (BP Location: Right Arm)   Pulse 79   Temp 98.3 ?F (36.8 ?C) (Oral)   Resp 18   SpO2 97%  ? ?Visual Acuity ?Right Eye Distance:   ?Left Eye Distance:   ?Bilateral Distance:   ? ?Right Eye Near:   ?Left Eye Near:    ?Bilateral Near:    ? ?Physical Exam ?Vitals and nursing note reviewed.  ?Constitutional:   ?   General: He is not in acute distress. ?   Appearance: He is well-developed.  ?HENT:  ?   Head: Normocephalic and atraumatic.  ?   Right Ear: External ear normal.  ?   Left Ear: External ear normal.  ?   Nose: Nose normal.  ?Eyes:  ?   Conjunctiva/sclera: Conjunctivae normal.  ?Cardiovascular:  ?   Rate and Rhythm: Normal rate and regular rhythm.  ?   Heart sounds: No murmur heard. ?Pulmonary:  ?   Effort: Pulmonary effort is normal. No respiratory distress.  ?   Breath sounds: Normal breath sounds.  ?Abdominal:  ?   Palpations: Abdomen is soft.  ?   Tenderness: There is no abdominal tenderness.  ?Musculoskeletal:     ?   General: No swelling. Normal range of motion.  ?   Right upper arm: Normal.  ?   Left upper arm: Normal.  ?   Right elbow: Swelling present. Normal range of motion.  ?   Left elbow: Normal.  ?   Cervical back: Normal range of motion and neck supple. No tenderness.  ?   Comments: No tenderness to right elbow with active range of motion and palpation. Patient   ?Skin: ?   General: Skin  is warm and dry.  ?   Capillary Refill: Capillary refill takes less than 2 seconds.  ?Neurological:  ?   General: No focal deficit present.  ?   Mental Status: He is alert and oriented to person, place, and time.

## 2022-03-18 NOTE — ED Triage Notes (Signed)
Pt reports discomfort and swelling in right elbow, right forearm and right hand x 5 days after he fell off at scooter. Pt is concern for blood clots.  ?

## 2022-03-20 ENCOUNTER — Encounter: Payer: Self-pay | Admitting: Internal Medicine

## 2022-03-20 ENCOUNTER — Other Ambulatory Visit: Payer: Self-pay | Admitting: Gastroenterology

## 2022-03-20 ENCOUNTER — Other Ambulatory Visit: Payer: Self-pay

## 2022-03-20 ENCOUNTER — Ambulatory Visit
Admission: EM | Admit: 2022-03-20 | Discharge: 2022-03-20 | Disposition: A | Discharge status: Home / Self Care | Payer: Medicare Other | Attending: Family Medicine | Admitting: Family Medicine

## 2022-03-20 DIAGNOSIS — R7989 Other specified abnormal findings of blood chemistry: Secondary | ICD-10-CM

## 2022-03-20 DIAGNOSIS — N390 Urinary tract infection, site not specified: Secondary | ICD-10-CM | POA: Diagnosis present

## 2022-03-20 LAB — POCT URINALYSIS DIP (MANUAL ENTRY)
Bilirubin, UA: NEGATIVE
Glucose, UA: NEGATIVE mg/dL
Ketones, POC UA: NEGATIVE mg/dL
Nitrite, UA: POSITIVE — AB
Protein Ur, POC: 30 mg/dL — AB
Spec Grav, UA: >=1.03 — AB (ref 1.010–1.025)
Urobilinogen, UA: 1 E.U./dL
pH, UA: 5.5 (ref 5.0–8.0)

## 2022-03-20 MED ORDER — CIPROFLOXACIN HCL 500 MG PO TABS: 500.0000 mg | ORAL_TABLET | Freq: Two times a day (BID) | ORAL | 0 refills | Status: DC

## 2022-03-20 NOTE — Progress Notes (Signed)
Noted and released. Mailing out to the pt today (will go out tomorrow) ?

## 2022-03-20 NOTE — ED Provider Notes (Signed)
?[facility] [place] ? ? ? ?CSN: [id number] ?Arrival date & time: 03/20/22  1612 ? ? ?  ? ?History   ?Chief Complaint ?Chief Complaint  ?Patient presents with  ? uti symptoms  ? ? ?HPI ?Jonathan Greene is a 81 y.o. male.  ? ?Presenting today with 2-day history of dysuria, foul-smelling urine.  Denies fever, chills, abdominal pain, nausea vomiting diarrhea, bowel changes.  History of prostate cancer status post radiation therapy, no known history of urinary tract infections.  Not trying anything for symptoms. ? ? ?Past Medical History:  ?Diagnosis Date  ? A-fib ([place])   ? Bradycardia   ? COPD (chronic obstructive pulmonary disease) ([place])   ? Frequent urination   ? GERD (gastroesophageal reflux disease)   ? Hypercholesterolemia   ? Hypertension   ? MI (myocardial infarction) ([place])   ? Prostate cancer ([place])   ? RADIATION TX AND SCHEDULED FOR SEED IMPLANTS 02-28-12  ? Sleep apnea   ? Stroke [facility])   ? 2011;ST memory loss, no other deficits.  ? Urinary urgency   ? ? ?Patient Active Problem List  ? Diagnosis Date Noted  ? COPD with acute exacerbation ([place]) 09/30/2021  ? CAP (community acquired pneumonia) 09/30/2021  ? Hemoptysis 09/30/2021  ? Mass of upper lobe of left lung 06/08/2020  ? Diarrhea 12/31/2019  ? Melena 12/31/2019  ? Essential hypertension 12/11/2016  ? 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 Emphysema of lung ([place]) 12/11/2016  ? A-fib ([place]) 02/11/2015  ? Hematochezia 08/04/2014  ? Prostate cancer ([place]) 12/07/2011  ? ? ?Past Surgical History:  ?Procedure Laterality Date  ? CATARACT EXTRACTION W/PHACO Right 11/06/2013  ? Procedure: CATARACT EXTRACTION PHACO AND INTRAOCULAR LENS PLACEMENT (IOC);  Surgeon: Tonny Branch, MD;  Location: [facility];  Service: Ophthalmology;  Laterality: Right;  CDE 11.67  ? CATARACT EXTRACTION W/PHACO Left 11/25/2015  ? Procedure: CATARACT EXTRACTION PHACO AND INTRAOCULAR LENS PLACEMENT ; CDE:  8.15;  Surgeon: Tonny Branch, MD;  Location: [facility];  Service: Ophthalmology;  Laterality: Left;  ?  COLONOSCOPY  10/03/2006  ? [id number] rectal polyps cold biopsied/removed, otherwise normal rectum/Sigmoid diverticula.  Remainder of colon mucosa appeared normal  ? COLONOSCOPY N/A 08/19/2014  ? RMR: Radiation proctitis-status post APC ablation. Colonic diverticulosis.  ? CYSTOSCOPY  02/28/2012  ? Procedure: CYSTOSCOPY;  Surgeon: Bernestine Amass, MD;  Location: [facility];  Service: Urology;  Laterality: N/A;   no seeds noted in bladder  ? RADIOACTIVE SEED IMPLANT  02/28/2012  ? Procedure: RADIOACTIVE SEED IMPLANT;  Surgeon: Bernestine Amass, MD;  Location: [facility];  Service: Urology;  Laterality: N/A;  67seeds implanted   ? ? ? ? ? ?Home Medications   ? ?Prior to Admission medications   ?Medication Sig Start Date End Date Taking? Authorizing Provider  ?ciprofloxacin (CIPRO) 500 MG tablet Take 1 tablet (500 mg total) by mouth 2 (two) times daily. 03/20/22  Yes Volney American, PA-C  ?acetaminophen (TYLENOL) 500 MG tablet Take 2 tablets (1,000 mg total) by mouth every 6 (six) hours as needed. 03/18/22   Talbot Grumbling, FNP  ?albuterol (PROVENTIL) (2.5 MG/3ML) 0.083% nebulizer solution Take 3 mLs (2.5 mg total) by nebulization every 6 (six) hours as needed for wheezing or shortness of breath. 10/06/21 10/06/22  Parrett, Fonnie Mu, NP  ?albuterol (VENTOLIN HFA) 108 (90 Base) MCG/ACT inhaler Inhale 2 puffs into the lungs every 6 (six) hours as needed for wheezing or shortness of breath. 06/09/20   Chesley Mires, MD  ?  BREZTRI AEROSPHERE 160-9-4.8 MCG/ACT AERO Inhale 2 puffs into the lungs 2 (two) times daily. 09/16/21   [provider]  ?escitalopram (LEXAPRO) 10 MG tablet Take 5 mg by mouth daily.    [provider]  ?metoprolol succinate (TOPROL XL) 25 MG 24 hr tablet Take 1 tablet (25 mg total) by mouth daily. 07/29/20   Strader, Fransisco Hertz, PA-C  ?mineral oil-hydrophilic petrolatum (AQUAPHOR) ointment Apply topically as needed for dry skin. Apply thin layer of  ointment to wound bed daily with dressing changes. 03/18/22   Talbot Grumbling, FNP  ?Multiple Vitamin (MULTIVITAMIN ADULT) TABS Take 1 tablet by mouth daily.    [provider]  ?omeprazole (PRILOSEC) 20 MG capsule Take 20 mg by mouth daily.    [provider]  ?rivaroxaban (XARELTO) 20 MG TABS tablet Take 1 tablet (20 mg total) by mouth daily with supper. 03/16/15   Arnoldo Lenis, MD  ?simvastatin (ZOCOR) 40 MG tablet Take 40 mg by mouth at bedtime.  12/15/14   [provider]  ?solifenacin (VESICARE) 10 MG tablet Take 10 mg by mouth daily.  02/05/19   [provider]  ?tamsulosin (FLOMAX) 0.4 MG CAPS capsule Take 0.4 mg by mouth daily.    [provider]  ? ? ?Family History ?Family History  ?Problem Relation Age of Onset  ? Stroke Mother   ? Hypertension Mother   ? Colon cancer Sister   ?     in her 74s  ? Breast cancer Sister   ? COPD Sister   ? Hypertension Sister   ? Pneumonia Sister   ? Heart attack Brother   ? COPD Son   ? COPD Maternal Aunt   ? Cancer Neg Hx   ? Liver disease Neg Hx   ? ? ?Social History ?Social History  ? ?Tobacco Use  ? Smoking status: Former  ?  Packs/day: 2.00  ?  Years: 35.00  ?  Pack years: 70.00  ?  Types: Cigarettes  ?  Quit date: 02/21/1991  ?  Years since quitting: 31.0  ? Smokeless tobacco: Current  ?  Types: Chew  ? Tobacco comments:  ?  CHEW TOBACCO FOR 20 YRS   ?Vaping Use  ? Vaping Use: Never used  ?Substance Use Topics  ? Alcohol use: Yes  ?  Alcohol/week: 14.0 standard drinks  ?  Types: 14 Shots of liquor per week  ?  Comment: 4 ounces daily  ? Drug use: No  ? ? ? ?Allergies   ?Patient has no known allergies. ? ? ?Review of Systems ?Review of Systems ?Per HPI ? ?Physical Exam ?Triage Vital Signs ?ED Triage Vitals  ?Enc Vitals Group  ?   BP 03/20/22 1646 (!) 149/78  ?   Pulse Rate 03/20/22 1646 92  ?   Resp 03/20/22 1646 16  ?   Temp 03/20/22 1646 98.3 ?F (36.8 ?C)  ?   Temp Source 03/20/22 1646 Oral  ?   SpO2 03/20/22 1646  97 %  ?   Weight --   ?   Height --   ?   Head Circumference --   ?   Peak Flow --   ?   Pain Score 03/20/22 1648 0  ?   Pain Loc --   ?   Pain Edu? --   ?   Excl. in [place]? --   ? ?No data found. ? ?Updated Vital Signs ?BP (!) 149/78 (BP Location: Right Arm)   Pulse  92   Temp 98.3 ?F (36.8 ?C) (Oral)   Resp 16   SpO2 97%  ? ?Visual Acuity ?Right Eye Distance:   ?Left Eye Distance:   ?Bilateral Distance:   ? ?Right Eye Near:   ?Left Eye Near:    ?Bilateral Near:    ? ?Physical Exam ?Vitals and nursing note reviewed.  ?Constitutional:   ?   Appearance: Normal appearance.  ?HENT:  ?   Head: Atraumatic.  ?   Mouth/Throat:  ?   Mouth: Mucous membranes are moist.  ?   Pharynx: Oropharynx is clear.  ?Eyes:  ?   Extraocular Movements: Extraocular movements intact.  ?   Conjunctiva/sclera: Conjunctivae normal.  ?Cardiovascular:  ?   Rate and Rhythm: Normal rate.  ?Pulmonary:  ?   Effort: Pulmonary effort is normal.  ?   Breath sounds: Normal breath sounds.  ?Abdominal:  ?   General: Bowel sounds are normal. There is no distension.  ?   Palpations: Abdomen is soft.  ?   Tenderness: There is no abdominal tenderness. There is no right CVA tenderness, left CVA tenderness or guarding.  ?Musculoskeletal:     ?   General: Normal range of motion.  ?   Cervical back: Normal range of motion and neck supple.  ?Skin: ?   General: Skin is warm and dry.  ?Neurological:  ?   General: No focal deficit present.  ?   Mental Status: He is oriented to person, place, and time.  ?   Motor: No weakness.  ?   Gait: Gait normal.  ?Psychiatric:     ?   Mood and Affect: Mood normal.     ?   Thought Content: Thought content normal.     ?   Judgment: Judgment normal.  ? ? ? ?UC Treatments / Results  ?Labs ?(all labs ordered are listed, but only abnormal results are displayed) ?Labs Reviewed  ?POCT URINALYSIS DIP (MANUAL ENTRY) - Abnormal; Notable for the following components:  ?    Result Value  ? Clarity, UA cloudy (*)   ? Spec Grav, UA >=1.030 (*)    ? Blood, UA large (*)   ? Protein Ur, POC =30 (*)   ? Nitrite, UA Positive (*)   ? Leukocytes, UA Large (3+) (*)   ? All other components within normal limits  ?URINE CULTURE  ? ? ?EKG ? ? ?Radiology ?No results f

## 2022-03-20 NOTE — ED Triage Notes (Signed)
Pt states that 2 days ago he urine started burning and foul smell ? ?Denies Meds  ? ?Denies Fever ?

## 2022-03-20 NOTE — Progress Notes (Signed)
I have ordered ASMA, AMA, ANA, immunoglobulins, ceruloplasmin, alpha 1 antitrypsin. Just needs to be released. Thanks! ?

## 2022-03-23 LAB — URINE CULTURE: Culture: >=100000 — AB

## 2022-03-24 ENCOUNTER — Other Ambulatory Visit: Payer: Self-pay

## 2022-03-24 ENCOUNTER — Emergency Department (HOSPITAL_COMMUNITY)
Admission: EM | Admit: 2022-03-24 | Discharge: 2022-03-24 | Disposition: A | Discharge status: Home / Self Care | Payer: Medicare Other | Attending: Emergency Medicine | Admitting: Emergency Medicine

## 2022-03-24 ENCOUNTER — Emergency Department (HOSPITAL_COMMUNITY): Payer: Medicare Other

## 2022-03-24 ENCOUNTER — Encounter (HOSPITAL_COMMUNITY): Payer: Self-pay | Admitting: *Deleted

## 2022-03-24 DIAGNOSIS — I10 Essential (primary) hypertension: Secondary | ICD-10-CM | POA: Insufficient documentation

## 2022-03-24 DIAGNOSIS — Z23 Encounter for immunization: Secondary | ICD-10-CM | POA: Insufficient documentation

## 2022-03-24 DIAGNOSIS — S50311A Abrasion of right elbow, initial encounter: Secondary | ICD-10-CM | POA: Insufficient documentation

## 2022-03-24 DIAGNOSIS — L03113 Cellulitis of right upper limb: Secondary | ICD-10-CM

## 2022-03-24 DIAGNOSIS — W010XXA Fall on same level from slipping, tripping and stumbling without subsequent striking against object, initial encounter: Secondary | ICD-10-CM | POA: Insufficient documentation

## 2022-03-24 DIAGNOSIS — S59901A Unspecified injury of right elbow, initial encounter: Secondary | ICD-10-CM | POA: Diagnosis present

## 2022-03-24 LAB — CBC WITH DIFFERENTIAL/PLATELET
Abs Immature Granulocytes: 0.04 10*3/uL (ref 0.00–0.07)
Basophils Absolute: 0.1 10*3/uL (ref 0.0–0.1)
Basophils Relative: 1 %
Eosinophils Absolute: 0.2 10*3/uL (ref 0.0–0.5)
Eosinophils Relative: 2 %
HCT: 44.1 % (ref 39.0–52.0)
Hemoglobin: 14.1 g/dL (ref 13.0–17.0)
Immature Granulocytes: 1 %
Lymphocytes Relative: 17 %
Lymphs Abs: 1.5 10*3/uL (ref 0.7–4.0)
MCH: 31.5 pg (ref 26.0–34.0)
MCHC: 32 g/dL (ref 30.0–36.0)
MCV: 98.7 fL (ref 80.0–100.0)
Monocytes Absolute: 0.8 10*3/uL (ref 0.1–1.0)
Monocytes Relative: 9 %
Neutro Abs: 6.3 10*3/uL (ref 1.7–7.7)
Neutrophils Relative %: 70 %
Platelets: 176 10*3/uL (ref 150–400)
RBC: 4.47 MIL/uL (ref 4.22–5.81)
RDW: 13.9 % (ref 11.5–15.5)
WBC: 8.8 10*3/uL (ref 4.0–10.5)
nRBC: 0 % (ref 0.0–0.2)

## 2022-03-24 LAB — BASIC METABOLIC PANEL
Anion gap: 8 (ref 5–15)
BUN: 20 mg/dL (ref 8–23)
CO2: 24 mmol/L (ref 22–32)
Calcium: 9.1 mg/dL (ref 8.9–10.3)
Chloride: 107 mmol/L (ref 98–111)
Creatinine, Ser: 1.24 mg/dL (ref 0.61–1.24)
GFR, Estimated: 59 mL/min — ABNORMAL LOW (ref >60)
Glucose, Bld: 104 mg/dL — ABNORMAL HIGH (ref 70–99)
Potassium: 3.9 mmol/L (ref 3.5–5.1)
Sodium: 139 mmol/L (ref 135–145)

## 2022-03-24 MED ORDER — SULFAMETHOXAZOLE-TRIMETHOPRIM 800-160 MG PO TABS: 1.0000 | ORAL_TABLET | Freq: Two times a day (BID) | ORAL | 0 refills | Status: AC

## 2022-03-24 MED ORDER — TETANUS-DIPHTH-ACELL PERTUSSIS 5-2.5-18.5 LF-MCG/0.5 IM SUSY
0.5000 mL | PREFILLED_SYRINGE | Freq: Once | INTRAMUSCULAR | Status: AC
  Administered 2022-03-24: 0.5 mL via INTRAMUSCULAR
  Filled 2022-03-24: qty 0.5

## 2022-03-24 MED ORDER — SULFAMETHOXAZOLE-TRIMETHOPRIM 800-160 MG PO TABS
1.0000 | ORAL_TABLET | Freq: Once | ORAL | Status: AC
  Administered 2022-03-24: 1 via ORAL
  Filled 2022-03-24: qty 1

## 2022-03-24 NOTE — ED Provider Triage Note (Signed)
Emergency Medicine Provider Triage Evaluation Note ? ?Jonathan Greene , a 81 y.o. male  was evaluated in triage.  Pt complains of swelling and erythema of the left arm following a fall on 03/16/2022.  Had x-rays taken of the arm 2 days later which were normal.  Also treated for UTI with Cipro, which has resolved.  The arm is continued to worsen and has very mild tenderness.  Denies numbness or tingling, reduced range of motion, or pale/blue appearance of the arm.  Noticed some "blisters" appear on the wrist as of yesterday.  Is concerned for MRSA.  Hx of bradycardia and A-fib.  On Eliquis.  Denies fever or headache. ? ?Review of Systems  ?Positive: As above ?Negative: As above ? ?Physical Exam  ?BP 130/85 (BP Location: Left Arm)   Pulse (!) 102   Temp 97.9 ?F (36.6 ?C) (Oral)   Resp 20   Ht 6\' 2"  (1.88 m)   Wt 102.3 kg   SpO2 95%   BMI 28.96 kg/m?  ?Gen:   Awake, no distress   ?Resp:  Normal effort ?MSK:   Moves extremities without difficulty ?Other:  Irregularly irregular HR around 60-90 bpm;  ?Erythema and swelling of the right arm.  Full ROM.  Appears neurovascularly intact.  Mild faint blisters appreciated on the volar aspect of the right wrist.  Bandaged over the left elbow from initial fall abrasion.  Does not appear pale or cyanotic.  Radial pulses 2+ bilaterally. ?Afebrile ? ?Medical Decision Making  ?Medically screening exam initiated at 2:12 PM.  Appropriate orders placed.  QUINZELL MALCOMB was informed that the remainder of the evaluation will be completed by another provider, this initial triage assessment does not replace that evaluation, and the importance of remaining in the ED until their evaluation is complete. ? ?Labs and imaging ordered ?  ?Prince Rome, PA-C ?75/10/25 1427 ? ?

## 2022-03-24 NOTE — ED Provider Notes (Signed)
?[place] [facility] ?Provider Note ? ? ?CSN: [id number] ?Arrival date & time: 03/24/22  1307 ? ?  ? ?History ? ?Chief Complaint  ?Patient presents with  ? Arm Pain  ? ? ?Jonathan Greene is a 81 y.o. male with a history of hypertension, emphysema and atrial fibrillation, not on anticoagulants presenting for evaluation of a fall which occurred on April 14.  He describes tripping and falling and hitting his right elbow on the ground which caused an abrasion.  He was seen at an urgent care center at which time he was told x-rays were negative for any fractures.  He reports increasing swelling and redness of the forearm emanating from the abrasion site over the past several days.  He has developed several small blisters of his skin in addition to the redness.  He denies fevers or chills and has no pain with range of motion of his elbow wrist or hand.  He is concerned for possible infection of the forearm.  He does state the swelling has improved since he has been holding the arm and elevation today.  Of note he is currently on a course of Cipro for recurrent urinary tract infection. ? ?The history is provided by the patient.  ? ?  ? ?Home Medications ?Prior to Admission medications   ?Medication Sig Start Date End Date Taking? Authorizing Provider  ?acetaminophen (TYLENOL) 500 MG tablet Take 2 tablets (1,000 mg total) by mouth every 6 (six) hours as needed. 03/18/22   Talbot Grumbling, FNP  ?albuterol (PROVENTIL) (2.5 MG/3ML) 0.083% nebulizer solution Take 3 mLs (2.5 mg total) by nebulization every 6 (six) hours as needed for wheezing or shortness of breath. 10/06/21 10/06/22  Parrett, Fonnie Mu, NP  ?albuterol (VENTOLIN HFA) 108 (90 Base) MCG/ACT inhaler Inhale 2 puffs into the lungs every 6 (six) hours as needed for wheezing or shortness of breath. 06/09/20   Chesley Mires, MD  ?Judithann Sauger AEROSPHERE 160-9-4.8 MCG/ACT AERO Inhale 2 puffs into the lungs 2 (two) times daily. 09/16/21   [provider]   ?ciprofloxacin (CIPRO) 500 MG tablet Take 1 tablet (500 mg total) by mouth 2 (two) times daily. 03/20/22   Volney American, PA-C  ?escitalopram (LEXAPRO) 10 MG tablet Take 5 mg by mouth daily.    [provider]  ?metoprolol succinate (TOPROL XL) 25 MG 24 hr tablet Take 1 tablet (25 mg total) by mouth daily. 07/29/20   Strader, Fransisco Hertz, PA-C  ?mineral oil-hydrophilic petrolatum (AQUAPHOR) ointment Apply topically as needed for dry skin. Apply thin layer of ointment to wound bed daily with dressing changes. 03/18/22   Talbot Grumbling, FNP  ?Multiple Vitamin (MULTIVITAMIN ADULT) TABS Take 1 tablet by mouth daily.    [provider]  ?omeprazole (PRILOSEC) 20 MG capsule Take 20 mg by mouth daily.    [provider]  ?rivaroxaban (XARELTO) 20 MG TABS tablet Take 1 tablet (20 mg total) by mouth daily with supper. 03/16/15   Arnoldo Lenis, MD  ?simvastatin (ZOCOR) 40 MG tablet Take 40 mg by mouth at bedtime.  12/15/14   [provider]  ?solifenacin (VESICARE) 10 MG tablet Take 10 mg by mouth daily.  02/05/19   [provider]  ?tamsulosin (FLOMAX) 0.4 MG CAPS capsule Take 0.4 mg by mouth daily.    [provider]  ?   ? ?Allergies    ?Patient has no known allergies.   ? ?Review of Systems   ?Review of Systems  ?Constitutional:  Negative for fever.  ?HENT:  Negative for congestion and sore throat.   ?Eyes: Negative.   ?Respiratory:  Negative for chest tightness and shortness of breath.   ?Cardiovascular:  Negative for chest pain.  ?Gastrointestinal:  Negative for abdominal pain and nausea.  ?Genitourinary: Negative.   ?Musculoskeletal:  Positive for arthralgias. Negative for joint swelling and neck pain.  ?Skin:  Positive for color change and wound. Negative for rash.  ?Neurological:  Negative for dizziness, weakness, light-headedness, numbness and headaches.  ?Psychiatric/Behavioral: Negative.    ? ?Physical Exam ?Updated Vital Signs ?BP (!) 124/95 (BP  Location: Left Arm)   Pulse 81   Temp 97.9 ?F (36.6 ?C) (Oral)   Resp 16   Ht 6\' 2"  (1.88 m)   Wt 102.3 kg   SpO2 99%   BMI 28.96 kg/m?  ?Physical Exam ?Constitutional:   ?   General: He is not in acute distress. ?   Appearance: He is well-developed.  ?HENT:  ?   Head: Normocephalic.  ?Cardiovascular:  ?   Rate and Rhythm: Normal rate.  ?Pulmonary:  ?   Effort: Pulmonary effort is normal.  ?   Breath sounds: No wheezing.  ?Musculoskeletal:     ?   General: Tenderness present. Normal range of motion.  ?   Cervical back: Neck supple.  ?   Comments: There is some mild tenderness to palpation at his right olecranon at the site of the abrasion.  He has no pain to palpation of the elbow joint, forearm or wrist.  He does have mild edema of his right forearm, increased warmth and mild erythema on the posterior forearm.  Clear drainage from the abrasion.  There is no induration, no localizing areas of fluctuance.  ?Skin: ?   Findings: Erythema present. No rash.  ?   Comments: 3 cm superficial abrasion at the right dorsal proximal forearm.  ? ? ?ED Results / Procedures / Treatments   ?Labs ?(all labs ordered are listed, but only abnormal results are displayed) ?Labs Reviewed  ?BASIC METABOLIC PANEL - Abnormal; Notable for the following components:  ?    Result Value  ? Glucose, Bld 104 (*)   ? GFR, Estimated 59 (*)   ? All other components within normal limits  ?CULTURE, BLOOD (ROUTINE X 2)  ?CULTURE, BLOOD (ROUTINE X 2)  ?CBC WITH DIFFERENTIAL/PLATELET  ? ? ?EKG ?None ? ?Radiology ?DG Elbow Complete Right ? ?Result Date: 03/24/2022 ?CLINICAL DATA:  Right elbow pain.  [place] a week ago. EXAM: RIGHT ELBOW - COMPLETE 3+ VIEW COMPARISON:  Right elbow x-rays dated March 18, 2022. FINDINGS: There is no evidence of fracture, dislocation, or joint effusion. There is no evidence of arthropathy or other focal bone abnormality. Continued prominent posterior elbow soft tissue swelling over the olecranon. IMPRESSION: 1. Continued  prominent posterior elbow soft tissue swelling over the olecranon, suggestive of olecranon bursitis. 2. No acute osseous abnormality. Electronically Signed   By: Titus Dubin M.D.   On: 03/24/2022 15:28  ? ?DG Forearm Right ? ?Result Date: 03/24/2022 ?CLINICAL DATA:  Continued right forearm pain since fall a week ago. EXAM: RIGHT FOREARM - 2 VIEW COMPARISON:  Right forearm x-rays dated March 18, 2022. FINDINGS: There is no evidence of fracture or other focal bone lesions. Similar advanced degenerative changes of the wrist. Continued diffuse soft tissue swelling. IMPRESSION: 1. No acute osseous abnormality. Continued diffuse soft tissue swelling. Electronically Signed   By: Titus Dubin M.D.   On: 03/24/2022 15:30   ? ?  Procedures ?Procedures  ? ? ?Medications Ordered in ED ?Medications  ?Tdap (BOOSTRIX) injection 0.5 mL (0.5 mLs Intramuscular Given 03/24/22 1622)  ? ? ?ED Course/ Medical Decision Making/ A&P ?  ?                        ?Medical Decision Making ?Labs reassuring, normal WBC count, bmet is normal.  X-rays are also reassuring with no fractures noted.  There is suggestion of an olecranon bursitis.  There is not clearly an infected bursa, although the forearm does suggest a cellulitis.  He has full range of motion of the elbow and wrist without pain, therefore there is no joint involvement with this injury.  He is currently on Cipro, review of the urine culture from the 17th indicates that his UTI is susceptible to Bactrim which would be a better choice to also cover him for the cellulitis.  He was switched to this antibiotic.  He was also given a tetanus booster as he is unsure of his status.  He was advised close follow-up with his PCP for recheck in 3 days, returning here for sooner for any significantly worsening symptoms including pain, redness, development of fevers or any constitutional symptoms. ? ?Amount and/or Complexity of Data Reviewed ?Labs: ordered. ?   Details: Per above ?Radiology:  ordered and independent interpretation performed. ?   Details: Per above ? ?Risk ?Prescription drug management. ? ? ? ? ? ? ? ? ? ? ?Final Clinical Impression(s) / ED Diagnoses ?Final diagnoses:  ?None  ? ? ?Rx / DC

## 2022-03-24 NOTE — ED Triage Notes (Signed)
Pt in c/o R arm pain post fall 03/17/22, pt reports being evaluated at [facility], pt here today for swelling now that is going down the hand and states, "it is blistering", pt A&O x4 ?

## 2022-03-24 NOTE — ED Notes (Signed)
ED Provider at bedside. 

## 2022-03-24 NOTE — Discharge Instructions (Signed)
Take the entire course of the antibiotic prescribed in place of the Cipro.  This medication should cover you both for your urinary infection and for your skin infection.  I recommend elevation and gentle warm compresses to your forearm which can also help with this suspected infection.  I suggest using soap and water wash to the abrasion site at your elbow twice daily, you may keep it covered with a bandage as you are doing. ?

## 2022-03-29 LAB — CULTURE, BLOOD (ROUTINE X 2)
Culture: NO GROWTH
Culture: NO GROWTH

## 2022-04-10 ENCOUNTER — Ambulatory Visit (INDEPENDENT_AMBULATORY_CARE_PROVIDER_SITE_OTHER): Payer: Medicare Other

## 2022-04-10 ENCOUNTER — Ambulatory Visit
Admission: EM | Admit: 2022-04-10 | Discharge: 2022-04-10 | Disposition: A | Discharge status: Home / Self Care | Payer: Medicare Other | Attending: Nurse Practitioner | Admitting: Nurse Practitioner

## 2022-04-10 DIAGNOSIS — M545 Low back pain, unspecified: Secondary | ICD-10-CM

## 2022-04-10 DIAGNOSIS — S41112A Laceration without foreign body of left upper arm, initial encounter: Secondary | ICD-10-CM | POA: Diagnosis not present

## 2022-04-10 LAB — POCT URINALYSIS DIP (MANUAL ENTRY)
Bilirubin, UA: NEGATIVE
Blood, UA: NEGATIVE
Glucose, UA: NEGATIVE mg/dL
Leukocytes, UA: NEGATIVE
Nitrite, UA: NEGATIVE
Protein Ur, POC: NEGATIVE mg/dL
Spec Grav, UA: 1.025 (ref 1.010–1.025)
Urobilinogen, UA: 2 E.U./dL — AB
pH, UA: 6.5 (ref 5.0–8.0)

## 2022-04-10 NOTE — Discharge Instructions (Addendum)
Leave the Steri-Strips in place until they fall off. ?Monitor the areas on your left arm for any signs of infection to include drainage, redness, swelling, fever, chills, or generalized weakness. ?With regard to your back pain, I would like for you to follow-up with Ortho care at [facility] within the next 24 to 48 hours for further evaluation of the unsuspected findings on your x-ray today.  [employer] is being provided with their contact information. ?For your back pain, I recommend Tylenol to take for your pain.  May take Tylenol prior to strength 650 mg tablets every 8 hours as needed. ?May also use ice or heat to your back.  Recommend applying for 20 minutes, removing for 1 hour, then repeat. ?Your urinalysis was negative for any infection today. ?Follow-up as needed. ?

## 2022-04-10 NOTE — ED Triage Notes (Signed)
Pt presents with skin tear to left upper arm from dropping an object and trying to catch it , also has lower back pain for past week  ?

## 2022-04-10 NOTE — ED Provider Notes (Signed)
?[place] ? ? ? ?CSN: [id number] ?Arrival date & time: 04/10/22  1537 ? ? ?  ? ?History   ?Chief Complaint ?Chief Complaint  ?Patient presents with  ? skin tear  ? Back Pain  ? ? ?HPI ?Jonathan Greene is a 81 y.o. male.  ? ?The patient is a 81 year old male who presents with a skin tear to the back of his left upper arm.  Patient states that he was trying to change a glow lamp when he stumbled backwards and fell onto the left arm.  Patient has 2 skin tears to the back of the left arm.  He also complains of right-sided low back pain that has been present for the past week.  He denies any urinary symptoms.  He states that he was recently treated for a UTI.  He denies any injury or trauma that may have caused his symptoms.  But after speaking with the patient, he does recall that he did fall approximately 2 to 3 weeks prior but he only suffered injuries to his elbow.  He denies numbness, tingling, radiation of pain, loss of bowel or bladder function.  He states that he does have a history of back problems. ? ?The history is provided by the patient.  ? ?Past Medical History:  ?Diagnosis Date  ? A-fib ([place])   ? Bradycardia   ? COPD (chronic obstructive pulmonary disease) ([place])   ? Frequent urination   ? GERD (gastroesophageal reflux disease)   ? Hypercholesterolemia   ? Hypertension   ? MI (myocardial infarction) ([place])   ? Prostate cancer ([place])   ? RADIATION TX AND SCHEDULED FOR SEED IMPLANTS 02-28-12  ? Sleep apnea   ? Stroke [facility])   ? 2011;ST memory loss, no other deficits.  ? Urinary urgency   ? ? ?Patient Active Problem List  ? Diagnosis Date Noted  ? COPD with acute exacerbation ([place]) 09/30/2021  ? CAP (community acquired pneumonia) 09/30/2021  ? Hemoptysis 09/30/2021  ? Mass of upper lobe of left lung 06/08/2020  ? Diarrhea 12/31/2019  ? Melena 12/31/2019  ? Essential hypertension 12/11/2016  ? 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 Emphysema of lung ([place]) 12/11/2016  ? A-fib ([place]) 02/11/2015  ? Hematochezia  08/04/2014  ? Prostate cancer ([place]) 12/07/2011  ? ? ?Past Surgical History:  ?Procedure Laterality Date  ? CATARACT EXTRACTION W/PHACO Right 11/06/2013  ? Procedure: CATARACT EXTRACTION PHACO AND INTRAOCULAR LENS PLACEMENT (IOC);  Surgeon: Tonny Branch, MD;  Location: [facility];  Service: Ophthalmology;  Laterality: Right;  CDE 11.67  ? CATARACT EXTRACTION W/PHACO Left 11/25/2015  ? Procedure: CATARACT EXTRACTION PHACO AND INTRAOCULAR LENS PLACEMENT ; CDE:  8.15;  Surgeon: Tonny Branch, MD;  Location: [facility];  Service: Ophthalmology;  Laterality: Left;  ? COLONOSCOPY  10/03/2006  ? [id number] rectal polyps cold biopsied/removed, otherwise normal rectum/Sigmoid diverticula.  Remainder of colon mucosa appeared normal  ? COLONOSCOPY N/A 08/19/2014  ? RMR: Radiation proctitis-status post APC ablation. Colonic diverticulosis.  ? CYSTOSCOPY  02/28/2012  ? Procedure: CYSTOSCOPY;  Surgeon: Bernestine Amass, MD;  Location: [facility];  Service: Urology;  Laterality: N/A;   no seeds noted in bladder  ? RADIOACTIVE SEED IMPLANT  02/28/2012  ? Procedure: RADIOACTIVE SEED IMPLANT;  Surgeon: Bernestine Amass, MD;  Location: [facility];  Service: Urology;  Laterality: N/A;  67seeds implanted   ? ? ? ? ? ?Home Medications   ? ?Prior to Admission medications   ?Medication Sig Start Date  End Date Taking? Authorizing Provider  ?acetaminophen (TYLENOL) 500 MG tablet Take 2 tablets (1,000 mg total) by mouth every 6 (six) hours as needed. 03/18/22   Talbot Grumbling, FNP  ?albuterol (PROVENTIL) (2.5 MG/3ML) 0.083% nebulizer solution Take 3 mLs (2.5 mg total) by nebulization every 6 (six) hours as needed for wheezing or shortness of breath. 10/06/21 10/06/22  Parrett, Fonnie Mu, NP  ?albuterol (VENTOLIN HFA) 108 (90 Base) MCG/ACT inhaler Inhale 2 puffs into the lungs every 6 (six) hours as needed for wheezing or shortness of breath. 06/09/20   Chesley Mires, MD  ?Judithann Sauger AEROSPHERE 160-9-4.8 MCG/ACT AERO Inhale 2 puffs  into the lungs 2 (two) times daily. 09/16/21   [provider]  ?escitalopram (LEXAPRO) 10 MG tablet Take 5 mg by mouth daily.    [provider]  ?metoprolol succinate (TOPROL XL) 25 MG 24 hr tablet Take 1 tablet (25 mg total) by mouth daily. 07/29/20   Strader, Fransisco Hertz, PA-C  ?mineral oil-hydrophilic petrolatum (AQUAPHOR) ointment Apply topically as needed for dry skin. Apply thin layer of ointment to wound bed daily with dressing changes. 03/18/22   Talbot Grumbling, FNP  ?Multiple Vitamin (MULTIVITAMIN ADULT) TABS Take 1 tablet by mouth daily.    [provider]  ?omeprazole (PRILOSEC) 20 MG capsule Take 20 mg by mouth daily.    [provider]  ?rivaroxaban (XARELTO) 20 MG TABS tablet Take 1 tablet (20 mg total) by mouth daily with supper. 03/16/15   Arnoldo Lenis, MD  ?simvastatin (ZOCOR) 40 MG tablet Take 40 mg by mouth at bedtime.  12/15/14   [provider]  ?solifenacin (VESICARE) 10 MG tablet Take 10 mg by mouth daily.  02/05/19   [provider]  ?tamsulosin (FLOMAX) 0.4 MG CAPS capsule Take 0.4 mg by mouth daily.    [provider]  ? ? ?Family History ?Family History  ?Problem Relation Age of Onset  ? Stroke Mother   ? Hypertension Mother   ? Colon cancer Sister   ?     in her 26s  ? Breast cancer Sister   ? COPD Sister   ? Hypertension Sister   ? Pneumonia Sister   ? Heart attack Brother   ? COPD Son   ? COPD Maternal Aunt   ? Cancer Neg Hx   ? Liver disease Neg Hx   ? ? ?Social History ?Social History  ? ?Tobacco Use  ? Smoking status: Former  ?  Packs/day: 2.00  ?  Years: 35.00  ?  Pack years: 70.00  ?  Types: Cigarettes  ?  Quit date: 02/21/1991  ?  Years since quitting: 31.1  ? Smokeless tobacco: Current  ?  Types: Chew  ? Tobacco comments:  ?  CHEW TOBACCO FOR 20 YRS   ?Vaping Use  ? Vaping Use: Never used  ?Substance Use Topics  ? Alcohol use: Yes  ?  Alcohol/week: 14.0 standard drinks  ?  Types: 14 Shots of liquor per week  ?   Comment: 4 ounces daily  ? Drug use: No  ? ? ? ?Allergies   ?Patient has no known allergies. ? ? ?Review of Systems ?Review of Systems  ?Constitutional: Negative.   ?Respiratory: Negative.    ?Cardiovascular: Negative.   ?Gastrointestinal: Negative.   ?Genitourinary: Negative.   ?Musculoskeletal:  Positive for back pain. Negative for gait problem.  ?Skin:   ?     Skin tear to left upper arm  ?Psychiatric/Behavioral: Negative.    ? ? ?  Physical Exam ?Triage Vital Signs ?ED Triage Vitals  ?Enc Vitals Group  ?   BP 04/10/22 1646 (!) 151/95  ?   Pulse Rate 04/10/22 1646 77  ?   Resp 04/10/22 1646 20  ?   Temp 04/10/22 1646 98.2 ?F (36.8 ?C)  ?   Temp src --   ?   SpO2 04/10/22 1646 95 %  ?   Weight --   ?   Height --   ?   Head Circumference --   ?   Peak Flow --   ?   Pain Score 04/10/22 1644 10  ?   Pain Loc --   ?   Pain Edu? --   ?   Excl. in [place]? --   ? ?No data found. ? ?Updated Vital Signs ?BP (!) 151/95   Pulse 77   Temp 98.2 ?F (36.8 ?C)   Resp 20   SpO2 95%  ? ?Visual Acuity ?Right Eye Distance:   ?Left Eye Distance:   ?Bilateral Distance:   ? ?Right Eye Near:   ?Left Eye Near:    ?Bilateral Near:    ? ?Physical Exam ?Vitals and nursing note reviewed.  ?Constitutional:   ?   Appearance: Normal appearance.  ?HENT:  ?   Head: Normocephalic.  ?Eyes:  ?   Extraocular Movements: Extraocular movements intact.  ?   Pupils: Pupils are equal, round, and reactive to light.  ?Cardiovascular:  ?   Rate and Rhythm: Normal rate and regular rhythm.  ?   Pulses: Normal pulses.  ?   Heart sounds: Normal heart sounds.  ?Pulmonary:  ?   Effort: Pulmonary effort is normal.  ?   Breath sounds: Normal breath sounds.  ?Abdominal:  ?   General: Bowel sounds are normal.  ?   Palpations: Abdomen is soft.  ?   Tenderness: There is no right CVA tenderness or left CVA tenderness.  ?Musculoskeletal:  ?   Cervical back: Normal range of motion.  ?   Thoracic back: Normal. No swelling. Normal range of motion.  ?   Lumbar back: Tenderness  (right lumbar spine) present. No swelling or deformity. Negative right straight leg raise test.  ?   Comments: TTP at L4-L5 and L5-S1. No tenderness noted to the thoracic spine.  ?Skin: ?   General: Skin is warm

## 2022-04-19 ENCOUNTER — Ambulatory Visit: Payer: Medicare Other | Admitting: Orthopedic Surgery

## 2022-04-19 ENCOUNTER — Encounter: Payer: Self-pay | Admitting: Orthopedic Surgery

## 2022-04-19 VITALS — BP 131/89 | HR 78 | Ht 74.0 in | Wt 233.2 lb

## 2022-04-19 DIAGNOSIS — M545 Low back pain, unspecified: Secondary | ICD-10-CM | POA: Diagnosis not present

## 2022-04-19 LAB — MITOCHONDRIAL ANTIBODIES: Mitochondrial M2 Ab, IgG: <=20 U (ref <=20.0)

## 2022-04-19 LAB — ALPHA-1 ANTITRYPSIN PHENOTYPE: A-1 Antitrypsin, Ser: 150 mg/dL (ref 83–199)

## 2022-04-19 LAB — ANTI-SMOOTH MUSCLE ANTIBODY, IGG: Actin (Smooth Muscle) Antibody (IGG): <20 U (ref <20)

## 2022-04-19 LAB — IGG, IGA, IGM
IgG (Immunoglobin G), Serum: 1479 mg/dL (ref 600–1540)
IgM, Serum: 72 mg/dL (ref 50–300)
Immunoglobulin A: 400 mg/dL — ABNORMAL HIGH (ref 70–320)

## 2022-04-19 LAB — ANA: Anti Nuclear Antibody (ANA): NEGATIVE

## 2022-04-19 LAB — CERULOPLASMIN: Ceruloplasmin: 24 mg/dL (ref 18–36)

## 2022-04-19 MED ORDER — PREDNISONE 10 MG (21) PO TBPK: ORAL_TABLET | ORAL | 0 refills | Status: DC

## 2022-04-19 MED ORDER — CYCLOBENZAPRINE HCL 10 MG PO TABS: 10.0000 mg | ORAL_TABLET | Freq: Two times a day (BID) | ORAL | 0 refills | Status: DC | PRN

## 2022-04-21 ENCOUNTER — Encounter: Payer: Self-pay | Admitting: Orthopedic Surgery

## 2022-04-21 NOTE — Progress Notes (Signed)
New Patient Visit  Assessment: Jonathan Greene is a 81 y.o. male with the following: Low back pain; possible acute on chronic T12 compression fracture  Plan: Jonathan Greene has had pain in his back, since a fall a little over a month ago.  He has progressively improved.  However, he continues to bother him.  Radiographs obtained prior to today's visit demonstrate a T12 compression fracture, however it is not clear whether this is new or old.  He may have sustained an acute on chronic compression fracture.  Nonetheless, this could be contributing to his pain.  As the bone continues to heal, his pain should improve.  This was discussed with the patient.  All questions were answered.  I recommended prednisone and Flexeril for his current symptoms.  If he has any further issues, he will contact the clinic.  Follow-up: Return if symptoms worsen or fail to improve.  Subjective:  Chief Complaint  Patient presents with   Back Pain    Fell about 03/13/22 and my back started hurting about a week later. I have had back pain off and on in the past but never this bad.    History of Present Illness: Jonathan Greene is a 81 y.o. male who presents for evaluation of low back pain.  He has had pain in his lower back for at least the last month.  He fell a little over a month ago.  Shortly thereafter, he started to have severe back pain.  It is mostly in his back, but not in the midline.  He states it is bothering him mostly to the right side of his lower back.  He denies radiating pains distally.  No numbness or tingling.  Pain is progressively improving.  He is not taking anything for pain on a regular basis.   Review of Systems: No fevers or chills No numbness or tingling No chest pain No shortness of breath No bowel or bladder dysfunction No GI distress No headaches   Medical History:  Past Medical History:  Diagnosis Date   A-fib ([facility])    Bradycardia    COPD (chronic obstructive  pulmonary disease) ([facility])    Frequent urination    GERD (gastroesophageal reflux disease)    Hypercholesterolemia    Hypertension    MI (myocardial infarction) ([place])    Prostate cancer ([place])    RADIATION TX AND SCHEDULED FOR SEED IMPLANTS 02-28-12   Sleep apnea    Stroke ([place])    2011;ST memory loss, no other deficits.   Urinary urgency     Past Surgical History:  Procedure Laterality Date   CATARACT EXTRACTION W/PHACO Right 11/06/2013   Procedure: CATARACT EXTRACTION PHACO AND INTRAOCULAR LENS PLACEMENT (IOC);  Surgeon: Tonny Branch, MD;  Location: [facility];  Service: Ophthalmology;  Laterality: Right;  CDE 11.67   CATARACT EXTRACTION W/PHACO Left 11/25/2015   Procedure: CATARACT EXTRACTION PHACO AND INTRAOCULAR LENS PLACEMENT ; CDE:  8.15;  Surgeon: Tonny Branch, MD;  Location: [facility];  Service: Ophthalmology;  Laterality: Left;   COLONOSCOPY  10/03/2006   [id number] rectal polyps cold biopsied/removed, otherwise normal rectum/Sigmoid diverticula.  Remainder of colon mucosa appeared normal   COLONOSCOPY N/A 08/19/2014   RMR: Radiation proctitis-status post APC ablation. Colonic diverticulosis.   CYSTOSCOPY  02/28/2012   Procedure: CYSTOSCOPY;  Surgeon: Bernestine Amass, MD;  Location: [facility];  Service: Urology;  Laterality: N/A;   no seeds noted in bladder   RADIOACTIVE SEED IMPLANT  02/28/2012  Procedure: RADIOACTIVE SEED IMPLANT;  Surgeon: Bernestine Amass, MD;  Location[facility];  Service: Urology;  Laterality: N/A;  86seeds implanted     Family History  Problem Relation Age of Onset   Stroke Mother    Hypertension Mother    Colon cancer Sister        in her 80s   Breast cancer Sister    COPD Sister    Hypertension Sister    Pneumonia Sister    Heart attack Brother    COPD Son    COPD Maternal Aunt    Cancer Neg Hx    Liver disease Neg Hx    Social History   Tobacco Use   Smoking status: Former    Packs/day: 2.00    Years: 35.00     Pack years: 70.00    Types: Cigarettes    Quit date: 02/21/1991    Years since quitting: 31.1   Smokeless tobacco: Current    Types: Chew   Tobacco comments:    CHEW TOBACCO FOR 20 YRS   Vaping Use   Vaping Use: Never used  Substance Use Topics   Alcohol use: Yes    Alcohol/week: 14.0 standard drinks    Types: 14 Shots of liquor per week    Comment: 4 ounces daily   Drug use: No    No Known Allergies  Current Meds  Medication Sig   cyclobenzaprine (FLEXERIL) 10 MG tablet Take 1 tablet (10 mg total) by mouth 2 (two) times daily as needed for muscle spasms.   predniSONE (STERAPRED UNI-PAK 21 TAB) 10 MG (21) TBPK tablet 10 mg DS 12 as directed   Current Facility-Administered Medications for the 04/19/22 encounter (Office Visit) with Mordecai Rasmussen, MD  Medication   albuterol (PROVENTIL) (2.5 MG/3ML) 0.083% nebulizer solution 2.5 mg    Objective: BP 131/89   Pulse 78   Ht 6\' 2"  (1.88 m)   Wt 233 lb 4 oz (105.8 kg)   BMI 29.95 kg/m   Physical Exam:  General: Alert and oriented. and No acute distress. Gait: Slow, steady gait.  Mid to lower back without tenderness to palpation centrally.  He has some tenderness to palpation over the right side of his lower back.  Strength in his bilateral lower extremities is 5/5.  Sensation is intact throughout the lower extremities.  1+ patellar tendon reflexes.  IMAGING: I personally reviewed images previously obtained from the ED  Lumbar spine x-rays without anterolisthesis.  Possible acute on chronic T12 and T11 fractures, with slight worsening compared to prior x-rays.   New Medications:  Meds ordered this encounter  Medications   predniSONE (STERAPRED UNI-PAK 21 TAB) 10 MG (21) TBPK tablet    Sig: 10 mg DS 12 as directed    Dispense:  48 tablet    Refill:  0   cyclobenzaprine (FLEXERIL) 10 MG tablet    Sig: Take 1 tablet (10 mg total) by mouth 2 (two) times daily as needed for muscle spasms.    Dispense:  20 tablet     Refill:  0      Mordecai Rasmussen, MD  04/21/2022 1:42 PM

## 2022-05-05 ENCOUNTER — Telehealth: Payer: Self-pay | Admitting: Cardiology

## 2022-05-05 ENCOUNTER — Telehealth: Payer: Self-pay

## 2022-05-05 NOTE — Telephone Encounter (Signed)
Pt c/o BP issue: STAT if pt c/o blurred vision, one-sided weakness or slurred speech  1. What are your last 5 BP readings?  107/87 113/84  2. Are you having any other symptoms (ex. Dizziness, headache, blurred vision, passed out)? Dizziness and weakness  3. What is your BP issue? Daughter states that pt's BP has been running low for the last 4 days. Please advise

## 2022-05-05 NOTE — Telephone Encounter (Signed)
   Agree that BP readings are in a good range. If having issues with Flexeril, agree with stopping it and following up with the prescribing provider so they can explore alternatives.   Signed, Erma Heritage, PA-C 05/05/2022, 4:00 PM

## 2022-05-05 NOTE — Telephone Encounter (Signed)
Noted, chart closed

## 2022-05-05 NOTE — Telephone Encounter (Signed)
Daughter says she was concerned his HR had ben to 113 but is now normal. I reassured he the BP's were normal.   He was recently given flexeril and daughter says he had multiple symptoms (sedation,dry mouth, lethargy) so they stopped it today. Daughter says she wants to just watch him now and will call us back if she needs to.      I will FYI B.Strader,PA-C

## 2022-05-05 NOTE — Telephone Encounter (Signed)
Called and spoke to patient about bringing sd card from cpap machine to appt on Monday and he agreed. Nothing further needed

## 2022-05-08 ENCOUNTER — Telehealth: Payer: Self-pay | Admitting: Cardiology

## 2022-05-08 ENCOUNTER — Ambulatory Visit: Payer: Medicare Other | Admitting: Pulmonary Disease

## 2022-05-08 ENCOUNTER — Ambulatory Visit (HOSPITAL_COMMUNITY)
Admission: RE | Admit: 2022-05-08 | Discharge: 2022-05-08 | Disposition: A | Discharge status: Home / Self Care | Payer: Medicare Other | Source: Ambulatory Visit | Attending: Pulmonary Disease | Admitting: Pulmonary Disease

## 2022-05-08 ENCOUNTER — Encounter: Payer: Self-pay | Admitting: Pulmonary Disease

## 2022-05-08 VITALS — BP 148/96 | HR 78 | Temp 98.7°F | Ht 74.0 in | Wt 230.0 lb

## 2022-05-08 DIAGNOSIS — G4733 Obstructive sleep apnea (adult) (pediatric): Secondary | ICD-10-CM

## 2022-05-08 DIAGNOSIS — R053 Chronic cough: Secondary | ICD-10-CM

## 2022-05-08 DIAGNOSIS — Z9989 Dependence on other enabling machines and devices: Secondary | ICD-10-CM

## 2022-05-08 DIAGNOSIS — J432 Centrilobular emphysema: Secondary | ICD-10-CM | POA: Diagnosis not present

## 2022-05-08 NOTE — Telephone Encounter (Signed)
Patient called stating that he has questions about his medications. States that he spoke with [place] last week. He is going to another doctors appointment now. Requested to be called after 1:00pm.

## 2022-05-08 NOTE — Progress Notes (Signed)
[facility]  Chief Complaint  Patient presents with   Follow-up    Coughing up green mucus, was coughing up blood a couple of weeks ago. Has had some SOB. Has had pneumonia recently. Has had high bp lately as well. Is hoping for a CXR today.     Constitutional:  BP (!) 148/96 (BP Location: Left Arm, Patient Position: Sitting)   Pulse 78   Temp 98.7 F (37.1 C) (Temporal)   Ht 6\' 2"  (1.88 m)   Wt 230 lb (104.3 kg)   SpO2 96% Comment: ra  BMI 29.53 kg/m   Past Medical History:  A fib, GERD, HLD, HTN, Prostate cancer  Past Surgical History:  His  has a past surgical history that includes Radioactive seed implant (02/28/2012); Cystoscopy (02/28/2012); Cataract extraction w/PHACO (Right, 11/06/2013); Colonoscopy (10/03/2006); Colonoscopy (N/A, 08/19/2014); and Cataract extraction w/PHACO (Left, 11/25/2015).  Brief Summary:  Jonathan Greene is a 81 y.o. 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      Subjective:   He is here with his grandson.  He was treated for pneumonia a few weeks ago.  He was coughing up blood, but not for the past several days.  Still has cough with green plugs.  He was feeling better after last round of antibiotics, but his family feels like he is getting worse again.  No sinus congestion, fever, wheezing, or GI symptoms.  His throat has been hoarse.    Uses CPAP nightly.  No issues with mask fit or pressure setting.  Physical Exam:   Appearance - well kempt   ENMT - no sinus tenderness, no oral exudate, no LAN, Mallampati 3 airway, no stridor  Respiratory - equal breath sounds bilaterally, no wheezing or rales  CV - s1s2 regular rate and rhythm, no murmurs  Ext - no clubbing, no edema  Skin - no rashes  Psych - normal mood and affect     Pulmonary testing:  PFT 06/30/20 >> FEV1 3.50 (99%), FEV1% 77, TLC 7.09  (90%), DLCO 51% Lt upper lobe core needle biopsy 07/01/20 >> benign lung with inflammation and fibrosis  Chest Imaging:  CT chest 06/03/20 >> atherosclerosis, aortic root 4.3 cm, severe centrilobular emphysema, LUL subpleural mass like consolidation 4.4 x 2.3 x 2.2 cm  PET scan 06/16/20 >> 2.4 x 2.3 cm pleural based LUL nodule 6.2 SUV, 6 mm AP window node 4.2 SUV, ascending aorta 4.1 cm, centrilobular and paraseptal emphysema PET scan 08/16/20 >> decreased size and SUV of LUL 2.2 x 1.2 cm mass, AP window LAN resolved CT chest 07/18/21 >> ascending aorta 4.3 cm, atherosclerosis, mod emphysema, new LUL patchy consolidation, plaque like lesion LUL CT chest 12/06/21 >> 4.4 cm ascending thoracic aorta, severe emphysema, 2.4 x 0.4 cm mass LUL  Sleep Tests:  Auto CPAP 02/07/22 to 05/07/22 >> used on 82 of 90 nights with average 7 hrs 40 min.  Average AHI 4.1 with median CPAP 7 and 95 th percentile CPAP 10 cm H2O  Cardiac Tests:  Echo 06/30/20 >> EF 55 to 60%, mod LVH  Social History:  He  reports that he quit smoking about 31 years ago. His smoking use included cigarettes. He has a 70.00 pack-year smoking history. His smokeless tobacco use includes chew. He reports current alcohol use of about 14.0 standard drinks per week. He reports that he does not use drugs.  Family History:  His family history includes Breast cancer in his sister; COPD in his maternal aunt, sister, and son; Colon cancer in his sister; Heart attack in his brother; Hypertension in his mother and sister; Pneumonia in his sister; Stroke in his mother.     Assessment/Plan:   Lt upper lobe mass like consolidation seen on CT chest 06/03/20. - biopsy from July 2021 showed inflammation and fibrosis - stable on CT chest from January 2023 - will need follow up CT in January 2024  Centrilobular emphysema with extensive history of smoking. - continue breztri - prn albuterol  Chronic bronchitis with recent episode of pneumonia. -  hemoptysis seems to have resolved - he was treated for Klebsiella pneumonia in November 2022; resistant to ancef, augmentin - repeat chest xray today; after reviewing results will call to let him know if he needs additional course of antibiotics   Obstructive sleep apnea. 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 - followed by Dr. Harl Bowie with cardiology  Time Spent Involved in Patient Care on Day of Examination:  40 minutes  Follow up:   Patient Instructions  Chest xray today.  Will call with results and let you know if you need additional course of antibiotics.  Follow up in 3 months.  Medication List:   Allergies as of 05/08/2022   No Known Allergies      Medication List        Accurate as of May 08, 2022 12:24 PM. If you have any questions, ask your nurse or doctor.          STOP taking these medications    cyclobenzaprine 10 MG tablet Commonly known as: FLEXERIL Stopped by: Chesley Mires, MD   predniSONE 10 MG (21) Tbpk tablet Commonly known as: STERAPRED UNI-PAK 21 TAB Stopped by: Chesley Mires, MD       TAKE these medications    acetaminophen 500 MG tablet Commonly known as: TYLENOL Take 2 tablets (1,000 mg total) by mouth every 6 (six) hours as needed.   albuterol 108 (90 Base) MCG/ACT inhaler Commonly known as: VENTOLIN HFA Inhale 2 puffs into the lungs every 6 (six) hours as needed for wheezing or shortness of breath.   albuterol (2.5 MG/3ML) 0.083% nebulizer solution Commonly known as: PROVENTIL Take 3 mLs (2.5 mg total) by nebulization every 6 (six) hours as needed for wheezing or shortness of breath.   Breztri Aerosphere 160-9-4.8 MCG/ACT Aero Generic drug: Budeson-Glycopyrrol-Formoterol Inhale 2 puffs into the lungs 2 (two) times  daily.   escitalopram 10 MG tablet Commonly known as: LEXAPRO Take 5 mg by mouth daily.   metoprolol succinate 25 MG 24 hr tablet Commonly known as: Toprol XL Take 1 tablet (25 mg total) by mouth daily.   mineral oil-hydrophilic petrolatum ointment Apply topically as needed for dry skin. Apply thin layer of ointment to wound bed daily with dressing changes.   Multivitamin Adult Tabs Take 1 tablet by mouth daily.   omeprazole 20 MG capsule Commonly known as: PRILOSEC Take 20 mg by mouth daily.   rivaroxaban 20 MG Tabs tablet Commonly known as: XARELTO Take 1 tablet (20 mg total) by mouth daily with supper.   simvastatin 40 MG tablet Commonly known as: ZOCOR Take 40 mg by mouth at  bedtime.   solifenacin 10 MG tablet Commonly known as: VESICARE Take 10 mg by mouth daily.   tamsulosin 0.4 MG Caps capsule Commonly known as: FLOMAX Take 0.4 mg by mouth daily.        Signature:  Chesley Mires, MD [place] Pager - [phone] 05/08/2022, 12:24 PM

## 2022-05-08 NOTE — Telephone Encounter (Signed)
Spoke to pt who stated that when he called Friday 05/05/2022, the provider "stopped one medication and tripled the other one." Verbalized to pt what B. Strader, PA-C had stated in previous phone note about bp (please see note). Pt stated that he understood now and had no further questions or concerns.

## 2022-05-08 NOTE — Patient Instructions (Signed)
Chest xray today.  Will call with results and let you know if you need additional course of antibiotics.  Follow up in 3 months.

## 2022-05-09 ENCOUNTER — Telehealth: Payer: Self-pay | Admitting: Pulmonary Disease

## 2022-05-09 DIAGNOSIS — J189 Pneumonia, unspecified organism: Secondary | ICD-10-CM

## 2022-05-09 MED ORDER — LEVOFLOXACIN 500 MG PO TABS: ORAL_TABLET | ORAL | 0 refills | Status: DC

## 2022-05-09 NOTE — Telephone Encounter (Signed)
Called and spoke with Jodie and she voiced understanding. Sputum tests ordered and sample cups with bag and instructions placed at front desk. Jodie will call patient to make him aware to come by and pick up sample cups. Nothing further needed at this time.

## 2022-05-09 NOTE — Telephone Encounter (Signed)
Okay to order sputum for gram stain and culture.

## 2022-05-09 NOTE — Telephone Encounter (Signed)
DG Chest 2 View  Result Date: 05/09/2022 CLINICAL DATA:  81 year old male with a history of pneumonia and persisting cough EXAM: CHEST - 2 VIEW COMPARISON:  10/06/2021, 09/30/2021, 08/05/2021, chest CT 12/06/2021 FINDINGS: Cardiomediastinal silhouette unchanged in size and contour. No central vascular congestion or interlobular septal thickening. Bronchial wall thickening. Increased nodular density in the left upper lung in region of prior infection. No pneumothorax or pleural effusion. Coarsened interstitial markings persist. Stigmata of emphysema, with increased retrosternal airspace, flattened hemidiaphragms, increased AP diameter, and hyperinflation on the AP view. Degenerative changes spine with no displaced fracture. IMPRESSION: Suspected new bronchitis/bronchopneumonia, including increased nodular density in the left upper lung in the region of prior infection. Followup PA and lateral chest X-ray is recommended in 3-4 weeks following therapy to assure resolution. Electronically Signed   By: Corrie Mckusick D.O.   On: 05/09/2022 09:25     Please let him know his chest xray shows changes of bronchitis or early pneumonia still in upper part of his left lung.  Please send script for levaquin 500 mg every other day, #5 with no refills.

## 2022-05-09 NOTE — Telephone Encounter (Signed)
Called and spoke to patients daughter about results and she voiced understanding. She states that they would feel more comfrotable about results if the patient could have a sputum sample test to go along with it.   Dr. Halford Chessman please advise if it is okay to order a sputum sample for patient as well?

## 2022-05-12 LAB — GRAM STAIN W/SPUTUM CULT RFLX: White Blood Cells: NONE SEEN

## 2022-05-16 ENCOUNTER — Other Ambulatory Visit: Payer: Self-pay

## 2022-05-16 DIAGNOSIS — J189 Pneumonia, unspecified organism: Secondary | ICD-10-CM

## 2022-05-17 ENCOUNTER — Other Ambulatory Visit (HOSPITAL_COMMUNITY): Payer: Self-pay | Admitting: Adult Health Nurse Practitioner

## 2022-05-17 DIAGNOSIS — J189 Pneumonia, unspecified organism: Secondary | ICD-10-CM

## 2022-05-17 LAB — GRAM STAIN W/SPUTUM CULT RFLX

## 2022-05-31 ENCOUNTER — Ambulatory Visit (HOSPITAL_COMMUNITY)
Admission: RE | Admit: 2022-05-31 | Discharge: 2022-05-31 | Disposition: A | Discharge status: Home / Self Care | Payer: Medicare Other | Source: Ambulatory Visit | Attending: Adult Health Nurse Practitioner | Admitting: Adult Health Nurse Practitioner

## 2022-05-31 DIAGNOSIS — J189 Pneumonia, unspecified organism: Secondary | ICD-10-CM | POA: Diagnosis present

## 2022-06-12 ENCOUNTER — Encounter: Payer: Self-pay | Admitting: Pulmonary Disease

## 2022-06-12 ENCOUNTER — Ambulatory Visit: Payer: Medicare Other | Admitting: Pulmonary Disease

## 2022-06-12 VITALS — BP 136/88 | HR 79 | Temp 98.0°F | Ht 74.0 in | Wt 245.4 lb

## 2022-06-12 DIAGNOSIS — R911 Solitary pulmonary nodule: Secondary | ICD-10-CM | POA: Diagnosis not present

## 2022-06-12 DIAGNOSIS — J432 Centrilobular emphysema: Secondary | ICD-10-CM

## 2022-06-12 DIAGNOSIS — Z9989 Dependence on other enabling machines and devices: Secondary | ICD-10-CM

## 2022-06-12 DIAGNOSIS — G4733 Obstructive sleep apnea (adult) (pediatric): Secondary | ICD-10-CM

## 2022-06-12 DIAGNOSIS — R918 Other nonspecific abnormal finding of lung field: Secondary | ICD-10-CM

## 2022-06-12 DIAGNOSIS — Z77090 Contact with and (suspected) exposure to asbestos: Secondary | ICD-10-CM

## 2022-06-12 MED ORDER — BEVESPI AEROSPHERE 9-4.8 MCG/ACT IN AERO: 2.0000 | INHALATION_SPRAY | Freq: Two times a day (BID) | RESPIRATORY_TRACT | 5 refills | Status: DC

## 2022-06-12 NOTE — Progress Notes (Signed)
[facility]  Chief Complaint  Patient presents with   Follow-up    Cpap working well.     Constitutional:  BP 136/88 (BP Location: Left Arm, Patient Position: Sitting)   Pulse 79   Temp 98 F (36.7 C) (Temporal)   Ht 6\' 2"  (1.88 m)   Wt 245 lb 6.4 oz (111.3 kg)   SpO2 96% Comment: ra  BMI 31.51 kg/m   Past Medical History:  A fib, GERD, HLD, HTN, Prostate cancer  Past Surgical History:  His  has a past surgical history that includes Radioactive seed implant (02/28/2012); Cystoscopy (02/28/2012); Cataract extraction w/PHACO (Right, 11/06/2013); Colonoscopy (10/03/2006); Colonoscopy (N/A, 08/19/2014); and Cataract extraction w/PHACO (Left, 11/25/2015).  Brief Summary:  Jonathan Greene is a 81 y.o. 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      Subjective:   Cough is better.  Not having hemoptysis.  Denies fever, chest pain, or chest congestion.  He is worried about having frequent episodes of pneumonia over the past year.  Uses CPAP at night and working well.  Physical Exam:   Appearance - well kempt   ENMT - no sinus tenderness, no oral exudate, no LAN, Mallampati 3 airway, no stridor, deviated nasal septum, wears dentures  Respiratory - equal breath sounds bilaterally, no wheezing or rales  CV - s1s2 regular rate and rhythm, no murmurs  Ext - no clubbing, no edema  Skin - no rashes  Psych - normal mood and affect      Pulmonary testing:  PFT 06/30/20 >> FEV1 3.50 (99%), FEV1% 77, TLC 7.09 (90%), DLCO 51% Lt upper lobe core needle biopsy 07/01/20 >> benign lung with inflammation and fibrosis  Chest Imaging:  CT chest 06/03/20 >> atherosclerosis, aortic root 4.3 cm, severe centrilobular emphysema, LUL subpleural mass like consolidation 4.4 x 2.3 x 2.2 cm  PET scan 06/16/20 >> 2.4 x 2.3 cm pleural based LUL nodule 6.2 SUV, 6 mm AP  window node 4.2 SUV, ascending aorta 4.1 cm, centrilobular and paraseptal emphysema PET scan 08/16/20 >> decreased size and SUV of LUL 2.2 x 1.2 cm mass, AP window LAN resolved CT chest 07/18/21 >> ascending aorta 4.3 cm, atherosclerosis, mod emphysema, new LUL patchy consolidation, plaque like lesion LUL CT chest 12/06/21 >> 4.4 cm ascending thoracic aorta, severe emphysema, 2.4 x 0.4 cm mass LUL  Sleep Tests:  Auto CPAP 03/14/22 to 06/11/22 >> used on 87 of 90 nights with average 7 hrs 33 min.  Average AHI 3.6 with median CPAP 7 and 95 th percentile CPAP 10 cm H2O  Cardiac Tests:  Echo 06/30/20 >> EF 55 to 60%, mod LVH  Social History:  He  reports that he quit smoking about 31 years ago. His smoking use included cigarettes. He has a 70.00 pack-year smoking history. His smokeless tobacco use includes chew. He reports current alcohol use of about 14.0 standard drinks of alcohol per week. He reports that he does not use drugs.  Family History:  His family history includes Breast cancer in his sister; COPD in his maternal aunt, sister, and son; Colon cancer in his sister; Heart attack in his brother; Hypertension in his mother and sister; Pneumonia in his sister; Stroke in his mother.     Assessment/Plan:   Lt upper lobe mass like consolidation seen on CT chest 06/03/20. 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 - change to bevespi - prn albuterol  Chronic bronchitis with recent episode of pneumonia. - hemoptysis seems to have resolved - he was treated for Klebsiella pneumonia in November 2022; resistant to ancef, augmentin - treated with levaquin for another pneumonia in June 2023 - will see if removing ICS from inhaler regimen reduces his risk of recurrent pneumonia   Obstructive sleep apnea. - he is compliant with CPAP and  reports benefit from therapy - he uses [place] for his DME - continue auto CPAP 5 to 20 cm H2O   History of asbestos exposure. - worked as a Writer fibrillation, ascending thoracic aortic aneurysm. - followed by Dr. Harl Bowie with cardiology  Time Spent Involved in Patient Care on Day of Examination:  38 minutes  Follow up:   Patient Instructions  Bevespi two puffs in the morning and two puffs in the evening.  Stop using Breztri once you get Bevespi.  Will arrange for CT chest in January 2024 and follow up after this.  Medication List:   Allergies as of 06/12/2022   No Known Allergies      Medication List        Accurate as of June 12, 2022 10:08 AM. If you have any questions, ask your nurse or doctor.          STOP taking these medications    Breztri Aerosphere 160-9-4.8 MCG/ACT Aero Generic drug: Budeson-Glycopyrrol-Formoterol Stopped by: Chesley Mires, MD   levofloxacin 500 MG tablet Commonly known as: LEVAQUIN Stopped by: Chesley Mires, MD       TAKE these medications    acetaminophen 500 MG tablet Commonly known as: TYLENOL Take 2 tablets (1,000 mg total) by mouth every 6 (six) hours as needed.   albuterol 108 (90 Base) MCG/ACT inhaler Commonly known as: VENTOLIN HFA Inhale 2 puffs into the lungs every 6 (six) hours as needed for wheezing or shortness of breath.   albuterol (2.5 MG/3ML) 0.083% nebulizer solution Commonly known as: PROVENTIL Take 3 mLs (2.5 mg total) by nebulization every 6 (six) hours as needed for wheezing or shortness of breath.   Bevespi Aerosphere 9-4.8 MCG/ACT Aero Generic drug: Glycopyrrolate-Formoterol Inhale 2 puffs into the lungs 2 (two) times daily. Started by: Chesley Mires, MD   escitalopram 10 MG tablet Commonly known as: LEXAPRO Take 5 mg by mouth daily.   metoprolol succinate 25 MG 24 hr tablet Commonly known as: Toprol XL Take 1 tablet (25 mg total) by mouth daily.   mineral  oil-hydrophilic petrolatum ointment Apply topically as needed for dry skin. Apply thin layer of ointment to wound bed daily with dressing changes.   Multivitamin Adult Tabs Take 1 tablet by mouth daily.   omeprazole 20 MG capsule Commonly known as: PRILOSEC Take 20 mg by mouth daily.   rivaroxaban 20 MG Tabs tablet Commonly known as: XARELTO Take 1 tablet (20 mg total) by mouth daily with supper.   simvastatin 40 MG tablet Commonly known as: ZOCOR Take 40 mg by mouth at bedtime.   solifenacin 10 MG tablet Commonly known as: VESICARE Take 10 mg by mouth daily.   tamsulosin 0.4 MG Caps capsule Commonly known as: FLOMAX Take 0.4 mg by mouth daily.        Signature:  Chesley Mires, MD [place] Pager - [phone] 06/12/2022, 10:08 AM

## 2022-06-12 NOTE — Patient Instructions (Signed)
Bevespi two puffs in the morning and two puffs in the evening.  Stop using Breztri once you get Bevespi.  Will arrange for CT chest in January 2024 and follow up after this.

## 2022-06-15 IMAGING — DX DG LUMBAR SPINE 2-3V
3 series · 3 of 3 positions shown · non-contrast
Comparison: CT renal stone 01/20/2017. CT angiogram chest
07/18/2021. CT chest 05/23/2021.

CLINICAL DATA: Low back pain.

EXAM:
LUMBAR SPINE - 2-3 VIEW

[l-spine ap]
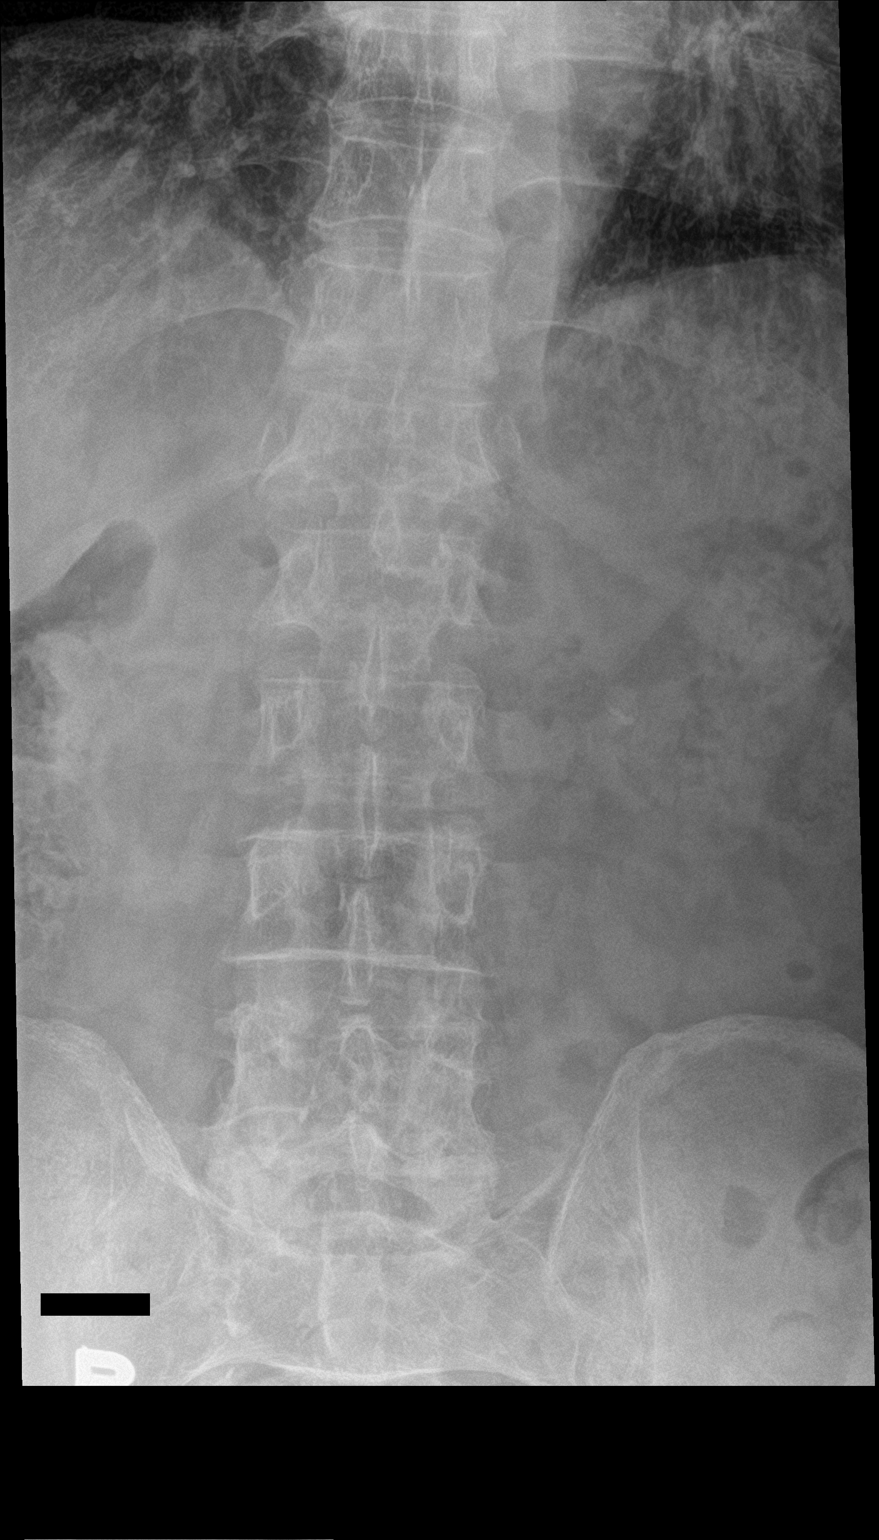

[l-spine lat]
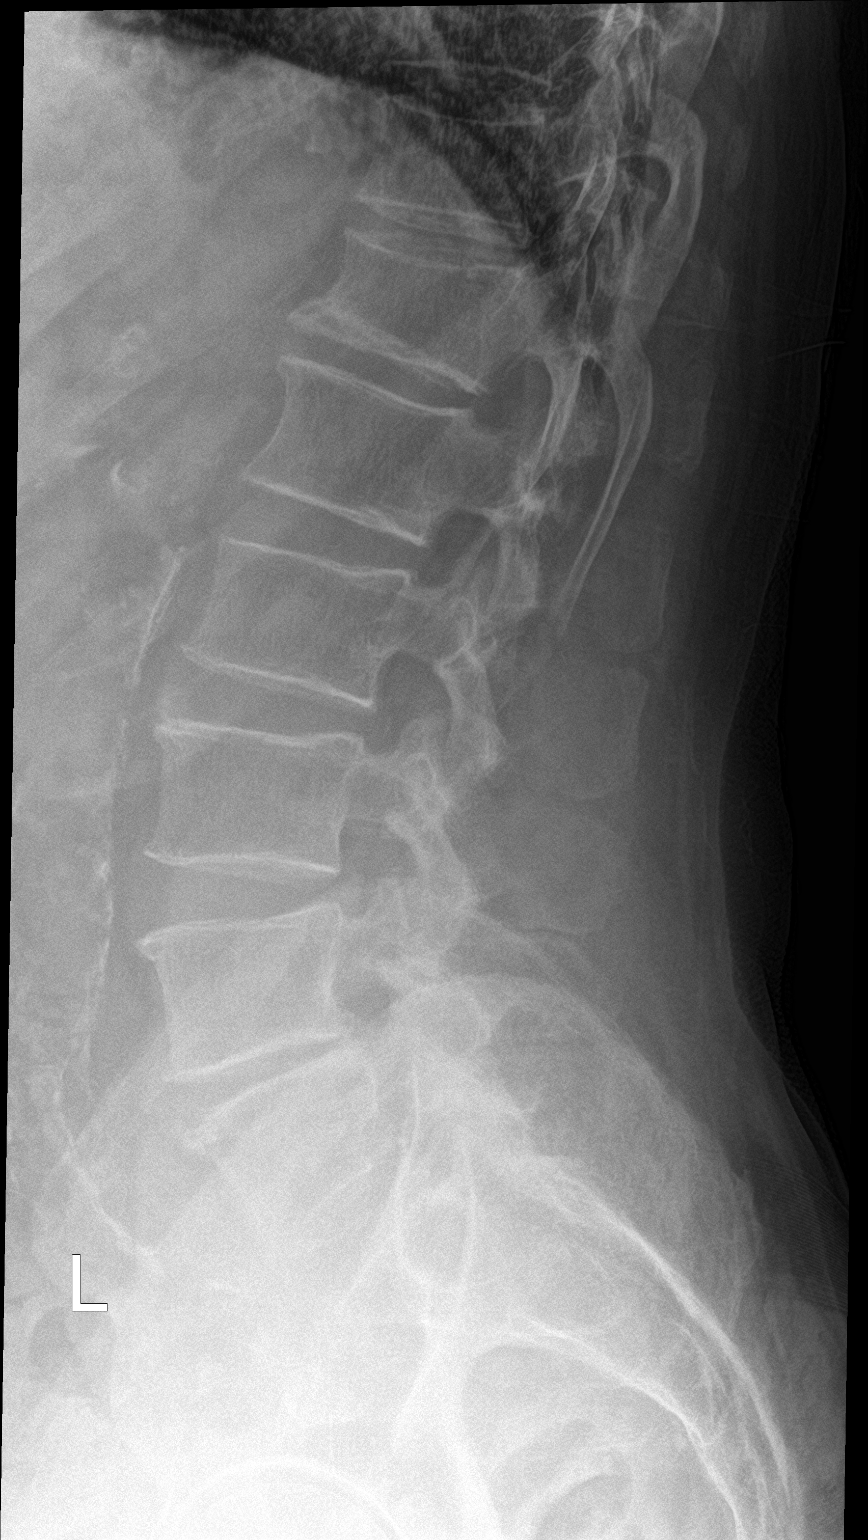

[l-spine spot]
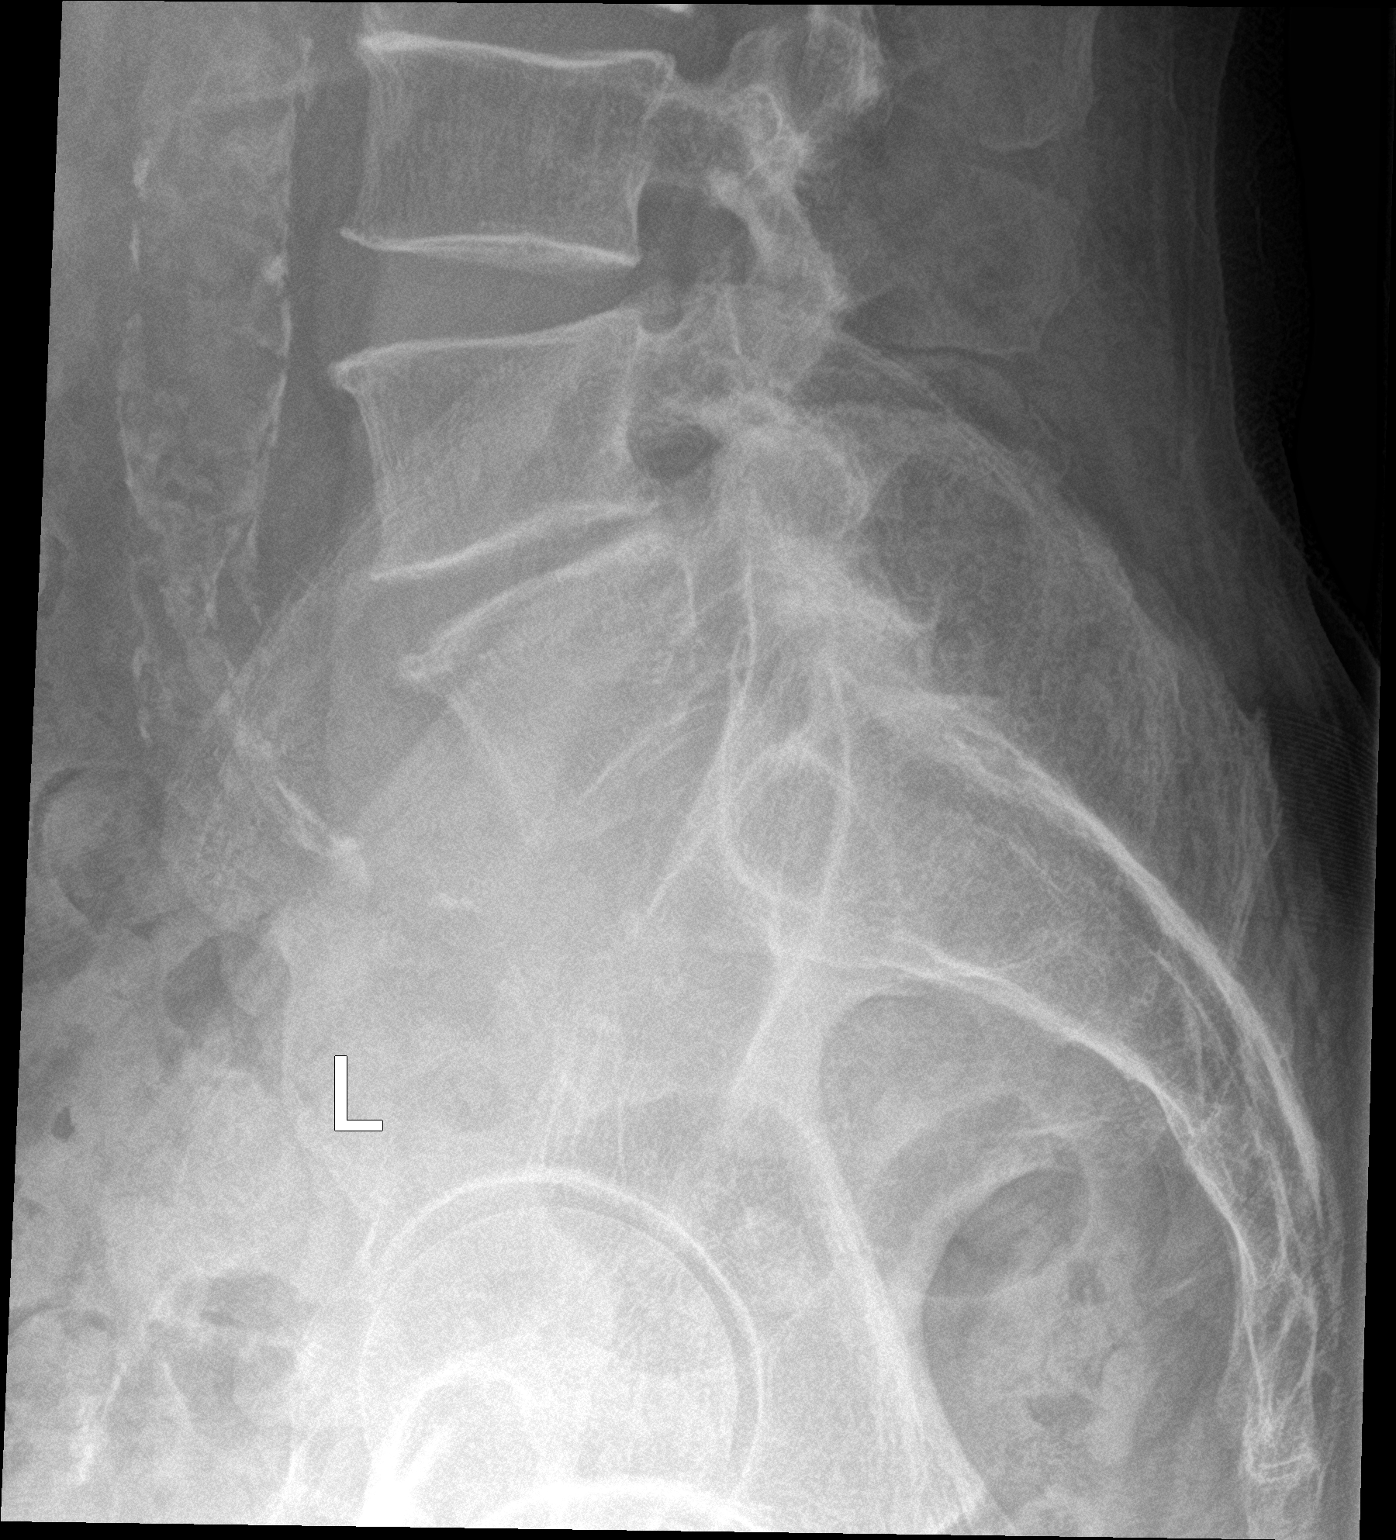

[3 of 3 positions shown; findings below may reference images not displayed]

FINDINGS: Alignment is normal. There is mild compression deformity of the T12
vertebral body which appears unchanged from the prior examination.
No acute fractures are seen. There is stable mild disc space
narrowing at L4-L5. Mild degenerative endplate osteophytes are seen
at L4-L5 and L5-S1. There are significant atherosclerotic
calcifications throughout the aorta. The bones are osteopenic.
IMPRESSION: 1. No acute bony abnormality.
2. Stable mild degenerative changes.
3. Stable mild compression deformity of T12.
4.  Aortic Atherosclerosis ([id number]).

## 2022-06-27 ENCOUNTER — Ambulatory Visit: Payer: Medicare Other | Admitting: Gastroenterology

## 2022-06-29 ENCOUNTER — Other Ambulatory Visit: Payer: Self-pay | Admitting: Family Medicine

## 2022-06-29 ENCOUNTER — Other Ambulatory Visit (HOSPITAL_COMMUNITY): Payer: Self-pay | Admitting: Family Medicine

## 2022-06-29 DIAGNOSIS — Z85118 Personal history of other malignant neoplasm of bronchus and lung: Secondary | ICD-10-CM

## 2022-06-29 DIAGNOSIS — R918 Other nonspecific abnormal finding of lung field: Secondary | ICD-10-CM

## 2022-06-29 DIAGNOSIS — R9389 Abnormal findings on diagnostic imaging of other specified body structures: Secondary | ICD-10-CM

## 2022-06-30 ENCOUNTER — Encounter: Payer: Self-pay | Admitting: Cardiology

## 2022-06-30 ENCOUNTER — Ambulatory Visit: Payer: Medicare Other | Admitting: Cardiology

## 2022-06-30 VITALS — BP 118/74 | HR 93 | Ht 74.0 in | Wt 234.8 lb

## 2022-06-30 DIAGNOSIS — I7121 Aneurysm of the ascending aorta, without rupture: Secondary | ICD-10-CM | POA: Diagnosis not present

## 2022-06-30 DIAGNOSIS — I4819 Other persistent atrial fibrillation: Secondary | ICD-10-CM

## 2022-06-30 DIAGNOSIS — D6869 Other thrombophilia: Secondary | ICD-10-CM

## 2022-06-30 DIAGNOSIS — I2510 Atherosclerotic heart disease of native coronary artery without angina pectoris: Secondary | ICD-10-CM

## 2022-06-30 DIAGNOSIS — I4891 Unspecified atrial fibrillation: Secondary | ICD-10-CM | POA: Diagnosis not present

## 2022-06-30 MED ORDER — SILDENAFIL CITRATE 20 MG PO TABS: ORAL_TABLET | ORAL | 6 refills | Status: DC

## 2022-06-30 NOTE — Progress Notes (Signed)
Clinical Summary Mr. Leider is a 81 y.o.male seen today for follow up of the following medical problems.     1. CAD - noted on prior chest CT - 02/2021 had coronary CTA showed 3 vessel CAD without signs of significant stenosis by FFR - evidence of apical aneurysm suggestiong possible distal vessel infarct.  - no symptoms - he is on xarelto for afib, not on ASA. On statin, beta blocker.     - no chest pains.    2. HTN - has noticed some low bp's at home. SBPs in 90s at times, can feel dizzy. Will stop his lisionpril and norvasc for a few days when this happens. - compliant with meds    - he is compliant with meds - he reports low bp's few weeks ago, stopped his norvasc for a period.    3. 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  - upcoming CT chest with pulmonary   4. 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 Hyperlipidemia - on simva 40mg     6. Afib - no recent palpitations - compliant with meds Past Medical History:  Diagnosis Date   A-fib (HCC)    Bradycardia    COPD (chronic obstructive pulmonary disease) (HCC)    Frequent urination    GERD (gastroesophageal reflux disease)    Hypercholesterolemia    Hypertension    MI (myocardial infarction) ([place])    Prostate cancer ([place])    RADIATION TX AND SCHEDULED FOR SEED IMPLANTS 02-28-12   Sleep apnea    Stroke ([place])    2011;ST memory loss, no other deficits.   Urinary urgency      No Known Allergies   Current Outpatient Medications  Medication Sig Dispense Refill   acetaminophen (TYLENOL) 500 MG tablet Take 2 tablets (1,000 mg total) by mouth every 6 (six) hours as needed. 30 tablet 0   albuterol (PROVENTIL) (2.5 MG/3ML) 0.083% nebulizer solution Take 3 mLs (2.5 mg total) by nebulization every 6 (six) hours as needed for wheezing or shortness of  breath. 75 mL 5   albuterol (VENTOLIN HFA) 108 (90 Base) MCG/ACT inhaler Inhale 2 puffs into the lungs every 6 (six) hours as needed for wheezing or shortness of breath. 6.7 g 5   escitalopram (LEXAPRO) 10 MG tablet Take 5 mg by mouth daily.     Glycopyrrolate-Formoterol (BEVESPI AEROSPHERE) 9-4.8 MCG/ACT AERO Inhale 2 puffs into the lungs 2 (two) times daily. 10.7 g 5   metoprolol succinate (TOPROL XL) 25 MG 24 hr tablet Take 1 tablet (25 mg total) by mouth daily. 30 tablet 11   mineral oil-hydrophilic petrolatum (AQUAPHOR) ointment Apply topically as needed for dry skin. Apply thin layer of ointment to wound bed daily with dressing changes. 198 g 0   Multiple Vitamin (MULTIVITAMIN ADULT) TABS Take 1 tablet by mouth daily.     omeprazole (PRILOSEC) 20 MG capsule Take 20 mg by mouth daily.     rivaroxaban (XARELTO) 20 MG TABS tablet Take 1 tablet (20 mg total) by mouth daily with supper. 90 tablet 4   simvastatin (ZOCOR) 40 MG tablet Take 40 mg by mouth at bedtime.      solifenacin (VESICARE) 10 MG tablet Take 10 mg by mouth daily.      tamsulosin (FLOMAX) 0.4 MG CAPS capsule Take 0.4  mg by mouth daily.     Current Facility-Administered Medications  Medication Dose Route Frequency Provider Last Rate Last Admin   albuterol (PROVENTIL) (2.5 MG/3ML) 0.083% nebulizer solution 2.5 mg  2.5 mg Nebulization Once Parrett, Fonnie Mu, NP         Past Surgical History:  Procedure Laterality Date   CATARACT EXTRACTION W/PHACO Right 11/06/2013   Procedure: CATARACT EXTRACTION PHACO AND INTRAOCULAR LENS PLACEMENT ([place]);  Surgeon: Tonny Mora Pedraza, MD;  Location: [facility];  Service: Ophthalmology;  Laterality: Right;  CDE 11.67   CATARACT EXTRACTION W/PHACO Left 11/25/2015   Procedure: CATARACT EXTRACTION PHACO AND INTRAOCULAR LENS PLACEMENT ; CDE:  8.15;  Surgeon: Tonny Ediberto Sens, MD;  Location: [facility];  Service: Ophthalmology;  Laterality: Left;   COLONOSCOPY  10/03/2006   CHE:NIDPOEUMPN rectal polyps cold  biopsied/removed, otherwise normal rectum/Sigmoid diverticula.  Remainder of colon mucosa appeared normal   COLONOSCOPY N/A 08/19/2014   RMR: Radiation proctitis-status post APC ablation. Colonic diverticulosis.   CYSTOSCOPY  02/28/2012   Procedure: CYSTOSCOPY;  Surgeon: Bernestine Amass, MD;  Location: [facility];  Service: Urology;  Laterality: N/A;   no seeds noted in bladder   RADIOACTIVE SEED IMPLANT  02/28/2012   Procedure: RADIOACTIVE SEED IMPLANT;  Surgeon: Bernestine Amass, MD;  Location[facility];  Service: Urology;  Laterality: N/A;  67seeds implanted      No Known Allergies    Family History  Problem Relation Age of Onset   Stroke Mother    Hypertension Mother    Colon cancer Sister        in her 83s   Breast cancer Sister    COPD Sister    Hypertension Sister    Pneumonia Sister    Heart attack Brother    COPD Son    COPD Maternal Aunt    Cancer Neg Hx    Liver disease Neg Hx      Social History Mr. Barbaro reports that he quit smoking about 31 years ago. His smoking use included cigarettes. He has a 70.00 pack-year smoking history. His smokeless tobacco use includes chew. Mr. Inabinet reports current alcohol use of about 14.0 standard drinks of alcohol per week.   Review of Systems CONSTITUTIONAL: No weight loss, fever, chills, weakness or fatigue.  HEENT: Eyes: No visual loss, blurred vision, double vision or yellow sclerae.No hearing loss, sneezing, congestion, runny nose or sore throat.  SKIN: No rash or itching.  CARDIOVASCULAR: per hpi RESPIRATORY: No shortness of breath, cough or sputum.  GASTROINTESTINAL: No anorexia, nausea, vomiting or diarrhea. No abdominal pain or blood.  GENITOURINARY: No burning on urination, no polyuria NEUROLOGICAL: No headache, dizziness, syncope, paralysis, ataxia, numbness or tingling in the extremities. No change in bowel or bladder control.  MUSCULOSKELETAL: No muscle, back pain, joint pain or  stiffness.  LYMPHATICS: No enlarged nodes. No history of splenectomy.  PSYCHIATRIC: No history of depression or anxiety.  ENDOCRINOLOGIC: No reports of sweating, cold or heat intolerance. No polyuria or polydipsia.  Marland Kitchen   Physical Examination Today's Vitals   06/30/22 1022  BP: 118/74  Pulse: 93  SpO2: 96%  Weight: 234 lb 12.8 oz (106.5 kg)  Height: 6\' 2"  (1.88 m)   Body mass index is 30.15 kg/m.  Gen: resting comfortably, no acute distress HEENT: no scleral icterus, pupils equal round and reactive, no palptable cervical adenopathy,  CV: irreg, no m/r/g no jvd Resp: Clear to auscultation bilaterally GI: abdomen is soft, non-tender, non-distended, normal bowel  sounds, no hepatosplenomegaly MSK: extremities are warm, no edema.  Skin: warm, no rash Neuro:  no focal deficits Psych: appropriate affect   Diagnostic Studies  02/2015 Echo Study Conclusions  - Left ventricle: The cavity size was normal. Wall thickness was   increased in a pattern of mild LVH. Systolic function was normal.   The estimated ejection fraction was in the range of 50% to 55%.   Wall motion was normal; there were no regional wall motion   abnormalities. Doppler parameters are consistent with abnormal   left ventricular relaxation (grade 1 diastolic dysfunction). - Aortic valve: Mildly calcified annulus. Trileaflet; mildly   thickened leaflets. There was mild regurgitation. Valve area   (VTI): 3.12 cm^2. Valve area (Vmax): 3.15 cm^2. - Aortic root: The visualized portion of the proximal ascending   aorta is mildly dilated at 4.0 cm. - Mitral valve: Mildly calcified annulus. Mildly thickened leaflets   . - Systemic veins: IVC appears small, suggestive of low RA pressures   and hypovolemia. - Technically adequate study.   02/2015 Event monitor Mild sinus brady, no tachyarrhythmias   03/2015 PFTs Decreased DLCO   04/2017 24 hr holter Telemetry tracings show primarily sinus rhythm Min HR 39, Max HR  87, Avg HR 56 Rare ventricular ectopy, primarily in the form of isolated PVCs. One 3 beat run of NSVT. Rare supraventricular ectopy, all in the form of isolated PACs. Some of the PACs are followed by a compensatory pause No symptoms reported     Assessment and Plan  1. CAD - coronary CTA as reported above, 3 vessel disease not significant by FFR - no symptoms, continue risk factor modification   2. Aortic aneurysm - mild, we will continue to monitor with imaging. Should get a view of with his upcoming chest CT ordered by pulmonary .    3. HTN - at goal, continue current meds  4. Afib/acquried thrombophlia - no symptoms, continue current meds including xarelto for stroke prevention - EKG today shows rate controlled afib      Arnoldo Lenis, M.D.

## 2022-06-30 NOTE — Patient Instructions (Signed)
Medication Instructions:  Sildenafil 20mg  - may take 3-5 tabs 30 minutes prior to intercourse Continue all other medications.     Labwork: none  Testing/Procedures: none  Follow-Up: 6 months   Any Other Special Instructions Will Be Listed Below (If Applicable).   If you need a refill on your cardiac medications before your next appointment, please call your pharmacy.

## 2022-07-18 ENCOUNTER — Ambulatory Visit: Payer: Medicare Other | Admitting: Cardiology

## 2022-07-31 ENCOUNTER — Ambulatory Visit (HOSPITAL_COMMUNITY)
Admission: RE | Admit: 2022-07-31 | Discharge: 2022-07-31 | Disposition: A | Discharge status: Home / Self Care | Payer: Medicare Other | Source: Ambulatory Visit | Attending: Family Medicine | Admitting: Family Medicine

## 2022-07-31 ENCOUNTER — Encounter (HOSPITAL_COMMUNITY): Payer: Self-pay

## 2022-07-31 DIAGNOSIS — R9389 Abnormal findings on diagnostic imaging of other specified body structures: Secondary | ICD-10-CM | POA: Diagnosis present

## 2022-07-31 DIAGNOSIS — Z85118 Personal history of other malignant neoplasm of bronchus and lung: Secondary | ICD-10-CM | POA: Insufficient documentation

## 2022-07-31 DIAGNOSIS — R918 Other nonspecific abnormal finding of lung field: Secondary | ICD-10-CM | POA: Diagnosis present

## 2022-07-31 MED ORDER — IOHEXOL 300 MG/ML  SOLN
100.0000 mL | Freq: Once | INTRAMUSCULAR | Status: AC | PRN
  Administered 2022-07-31: 75 mL via INTRAVENOUS

## 2022-08-04 ENCOUNTER — Encounter: Payer: Self-pay | Admitting: Cardiology

## 2022-08-04 ENCOUNTER — Encounter: Payer: Self-pay | Admitting: Pulmonary Disease

## 2022-08-04 DIAGNOSIS — R918 Other nonspecific abnormal finding of lung field: Secondary | ICD-10-CM

## 2022-08-04 DIAGNOSIS — R911 Solitary pulmonary nodule: Secondary | ICD-10-CM

## 2022-08-04 NOTE — Telephone Encounter (Signed)
Patient is asking for CT results for CT scan done on 8/28  Dr. Halford Chessman please advise. Thanks!

## 2022-08-08 NOTE — Telephone Encounter (Signed)
Called and spoke to patient and he voiced understanding. He was agreeable to PET scan.

## 2022-08-08 NOTE — Telephone Encounter (Signed)
Please let him know the spot in the upper part of his left lung looks bigger.  Please schedule a PET scan to assess further, and then schedule ROV with me or Tammy Parrett after the PET scan is done.

## 2022-08-16 ENCOUNTER — Telehealth: Payer: Self-pay | Admitting: Pulmonary Disease

## 2022-08-16 NOTE — Telephone Encounter (Signed)
  PET scan ordered to assess Lt upper lung nodule that had increased in size and now with spiculations.    Called insurance company at [phone] with case reference number [id number].  Received authorization information to allow PET scan to be scheduled:  [id number] - CPT code [phone]  Authorization expires on 09/30/22

## 2022-08-17 NOTE — Telephone Encounter (Signed)
Noted and scheduled

## 2022-08-18 ENCOUNTER — Encounter (HOSPITAL_COMMUNITY)
Admission: RE | Admit: 2022-08-18 | Discharge: 2022-08-18 | Disposition: A | Discharge status: Home / Self Care | Payer: Medicare Other | Source: Ambulatory Visit | Attending: Pulmonary Disease | Admitting: Pulmonary Disease

## 2022-08-18 DIAGNOSIS — R918 Other nonspecific abnormal finding of lung field: Secondary | ICD-10-CM | POA: Insufficient documentation

## 2022-08-18 LAB — GLUCOSE, CAPILLARY: Glucose-Capillary: 117 mg/dL — ABNORMAL HIGH (ref 70–99)

## 2022-08-18 MED ORDER — FLUDEOXYGLUCOSE F - 18 (FDG) INJECTION
11.1700 | Freq: Once | INTRAVENOUS | Status: AC
  Administered 2022-08-18: 11.17 via INTRAVENOUS

## 2022-08-21 ENCOUNTER — Encounter: Payer: Self-pay | Admitting: Cardiology

## 2022-08-25 ENCOUNTER — Encounter: Payer: Self-pay | Admitting: Adult Health

## 2022-08-25 ENCOUNTER — Ambulatory Visit: Payer: Medicare Other | Admitting: Adult Health

## 2022-08-25 VITALS — BP 134/90 | HR 79 | Temp 98.0°F | Ht 74.0 in | Wt 234.8 lb

## 2022-08-25 DIAGNOSIS — J441 Chronic obstructive pulmonary disease with (acute) exacerbation: Secondary | ICD-10-CM | POA: Diagnosis not present

## 2022-08-25 DIAGNOSIS — R051 Acute cough: Secondary | ICD-10-CM | POA: Diagnosis not present

## 2022-08-25 DIAGNOSIS — R918 Other nonspecific abnormal finding of lung field: Secondary | ICD-10-CM | POA: Diagnosis not present

## 2022-08-25 LAB — POC COVID19 BINAXNOW

## 2022-08-25 MED ORDER — DOXYCYCLINE HYCLATE 100 MG PO TABS: 100.0000 mg | ORAL_TABLET | Freq: Two times a day (BID) | ORAL | 0 refills | Status: DC

## 2022-08-25 MED ORDER — PREDNISONE 20 MG PO TABS: 20.0000 mg | ORAL_TABLET | Freq: Every day | ORAL | 0 refills | Status: DC

## 2022-08-25 NOTE — Addendum Note (Signed)
Addended by: Vanessa Barbara on: 08/25/2022 02:28 PM   Modules accepted: Orders

## 2022-08-25 NOTE — Assessment & Plan Note (Signed)
Acute COPD exacerbation.  We will treat with empiric antibiotics and steroids.  Patient is encouraged on smoking cessation.  Plan  Patient Instructions  Doxycycline 100mg  Twice daily  for 7 days ,take with food. (Wear sunscreen)  Prednisone 20mg  daily for 5 days , take with food.  Take Zyrtec 10mg  daily  Mucinex DM Twice daily  As needed cough/congestion  Continue on BEVESPI 2 puffs Twice daily  Albuterol inhaler or neb As needed  I will call about biopsy .  Follow up with Dr. Halford Chessman in 4 weeks and As needed   Please contact office for sooner follow up if symptoms do not improve or worsen or seek emergency care

## 2022-08-25 NOTE — Patient Instructions (Addendum)
Doxycycline 100mg  Twice daily  for 7 days ,take with food. (Wear sunscreen)  Prednisone 20mg  daily for 5 days , take with food.  Take Zyrtec 10mg  daily  Mucinex DM Twice daily  As needed cough/congestion  Continue on BEVESPI 2 puffs Twice daily  Albuterol inhaler or neb As needed  I will call about biopsy .  Follow up with Dr. Halford Chessman in 4 weeks and As needed   Please contact office for sooner follow up if symptoms do not improve or worsen or seek emergency care

## 2022-08-25 NOTE — Assessment & Plan Note (Signed)
Enlarging left upper lobe lung mass PET positive.  Discussed CT scan and PET scan in detail with patient and daughter.  Case was discussed with Dr. Halford Chessman .  Patient had a previous CT-guided biopsy 2 years ago in 2021.  He tolerated well without any known sequela.  Lung mass is peripheral.  Patient will be referred to interventional radiology for CT-guided biopsy.  Long discussion with patient regarding potential complications including possible pneumothorax.  Patient is on Xarelto.  We discussed holding this prior to procedure per protocol .   Plan  Patient Instructions  Doxycycline 100mg  Twice daily  for 7 days ,take with food. (Wear sunscreen)  Prednisone 20mg  daily for 5 days , take with food.  Take Zyrtec 10mg  daily  Mucinex DM Twice daily  As needed cough/congestion  Continue on BEVESPI 2 puffs Twice daily  Albuterol inhaler or neb As needed  I will call about biopsy .  Follow up with Dr. Halford Chessman in 4 weeks and As needed   Please contact office for sooner follow up if symptoms do not improve or worsen or seek emergency care

## 2022-08-25 NOTE — Progress Notes (Signed)
@Patient  ID: Jonathan Greene, male    DOB: Oct 08, 1941, 81 y.o.   MRN: [id number]  Chief Complaint  Patient presents with   Follow-up    Referring provider: Pablo Lawrence, NP  HPI: 81 year old male former smoker followed for COPD with emphysema, lung mass obstructive sleep apnea, history of asbestos exposure History significant for atrial fibrillation and ascending thoracic aortic aneurysm  TEST/EVENTS :  PFT 06/30/20 >> FEV1 3.50 (99%), FEV1% 77, TLC 7.09 (90%), DLCO 51% Lt upper lobe core needle biopsy 07/01/20 >> benign lung with inflammation and fibrosis   Chest Imaging:  CT of the chest without contrast on 06/03/2020 showed 2.3 x 2.2 x 4.4 cm subpleural mass like consolidation in the left upper lobe corresponding to the density seen on the prior radiograph. -Patient smoked for 33 years, quit smoking at age 34.  He smoked on average of 1 pack/day for most years and at the time of quitting, was smoking 3 packs/day of cigarettes. 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  He reports asbestos exposure throughout his career. 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 -PET scan on 06/16/2020 shows posterior left upper lobe pleural-based hypermetabolic nodule, 2.4 x 2.3 cm SUV 6.2.  AP window lymph node measures 6 mm with SUV 4.3.  No hypermetabolic extrathoracic metastatic disease. -MRI of the brain on 06/22/2020 shows single suspicious focus of enhancement along the inferolateral margin of the temporal lobe on the left measuring 4 mm, not well seen on sagittal or coronal postcontrast imaging.  Unsure if it is small metastasis or a surface vessel.  No second suspicious focus identified.  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 -Left upper lobe needle biopsy on 07/01/2020 shows lung tissue with a lymphoplasmacytic infiltrate and organizing pneumonia  type changes and fibrosis.  IHC negative for tumor. -PET scan on 08/16/2020 shows subpleural left upper lobe lung nodule measuring 2.2 x 1.2 cm, previously 2.8 x 2.2 cm.  SUV 3.4, previously 6.2.  Previously described hypermetabolic AP window node has resolved. - CT chest on 11/18/2020 showed further signs of regression of the left upper lobe mass.  Echo 06/30/20 >> EF 55 to 60%, mod LVH  CT chest 07/18/21 >> ascending aorta 4.3 cm, atherosclerosis, mod emphysema, new LUL patchy consolidation, plaque like lesion LUL CT chest 12/06/21 >> 4.4 cm ascending thoracic aorta, severe emphysema, 2.4 x 0.4 cm mass LUL  08/25/2022 Acute OV : COPD  Patient presents for an acute office visit.  Patient complains over the last week has had increased cough, congestion and thick mucus and increased shortness of breath.  Patient says several family members have had similar symptoms.  He denies any fever, hemoptysis, chest pain, orthopnea, edema. Started out with cold symptoms with nasal congestion and drainage. Taking Nyquil to help with cough.  No increased albuterol .  Remains on Bevespi Twice daily  .  Has restarted smoking. Discussed smoking cessation. Appetite is good with no nausea vomiting or diarrhea.  Patient had CT chest done July 31, 2022 showed a significantly increased size of pleural-based nodule in the lateral aspect of the left upper lobe measuring 2.1 x 1.5 cm.  Spiculations are seen in the margin of the lesion. Subsequent PET scan was done on August 18, 2022 that shows enlarging subpleural nodule peripherally in the left upper lobe intensely hypermetabolic consistent with lung cancer.  No evidence of metastatic disease. We discussed these scans in detail with patient and daughter.  Patient has no hemoptysis.  No unintentional weight loss.  He does have an extensive history of heavy smoking asbestos exposure along with other occupational exposures.   No Known Allergies  Immunization History   Administered Date(s) Administered   Fluad Quad(high Dose 65+) 09/15/2020, 09/16/2021   Influenza, High Dose Seasonal PF 08/18/2019   Moderna Sars-Covid-2 Vaccination 11/19/2020   PNEUMOCOCCAL CONJUGATE-20 01/18/2022   Pneumococcal Conjugate-13 08/10/2015   Tdap 03/24/2022   Zoster Recombinat (Shingrix) 10/18/2015    Past Medical History:  Diagnosis Date   A-fib (HCC)    Bradycardia    COPD (chronic obstructive pulmonary disease) (HCC)    Frequent urination    GERD (gastroesophageal reflux disease)    Hypercholesterolemia    Hypertension    MI (myocardial infarction) ([place])    Prostate cancer ([place])    RADIATION TX AND SCHEDULED FOR SEED IMPLANTS 02-28-12   Sleep apnea    Stroke ([place])    2011;ST memory loss, no other deficits.   Urinary urgency     Tobacco History: Social History   Tobacco Use  Smoking Status Former   Packs/day: 2.00   Years: 35.00   Total pack years: 70.00   Types: Cigarettes   Quit date: 02/21/1991   Years since quitting: 31.5  Smokeless Tobacco Current   Types: Chew  Tobacco Comments   CHEW TOBACCO FOR 31 YRS    Ready to quit: Not Answered Counseling given: Not Answered Tobacco comments: CHEW TOBACCO FOR 20 YRS    Outpatient Medications Prior to Visit  Medication Sig Dispense Refill   acetaminophen (TYLENOL) 500 MG tablet Take 2 tablets (1,000 mg total) by mouth every 6 (six) hours as needed. 30 tablet 0   albuterol (PROVENTIL) (2.5 MG/3ML) 0.083% nebulizer solution Take 3 mLs (2.5 mg total) by nebulization every 6 (six) hours as needed for wheezing or shortness of breath. 75 mL 5   albuterol (VENTOLIN HFA) 108 (90 Base) MCG/ACT inhaler Inhale 2 puffs into the lungs every 6 (six) hours as needed for wheezing or shortness of breath. 6.7 g 5   amLODipine (NORVASC) 2.5 MG tablet Take 2.5 mg by mouth daily.     escitalopram (LEXAPRO) 10 MG tablet Take 5 mg by mouth daily.     Glycopyrrolate-Formoterol (BEVESPI AEROSPHERE) 9-4.8 MCG/ACT AERO Inhale  2 puffs into the lungs 2 (two) times daily. 10.7 g 5   metoprolol succinate (TOPROL XL) 25 MG 24 hr tablet Take 1 tablet (25 mg total) by mouth daily. 30 tablet 11   mineral oil-hydrophilic petrolatum (AQUAPHOR) ointment Apply topically as needed for dry skin. Apply thin layer of ointment to wound bed daily with dressing changes. 198 g 0   Multiple Vitamin (MULTIVITAMIN ADULT) TABS Take 1 tablet by mouth daily.     omeprazole (PRILOSEC) 20 MG capsule Take 20 mg by mouth daily.     rivaroxaban (XARELTO) 20 MG TABS tablet Take 1 tablet (20 mg total) by mouth daily with supper. 90 tablet 4   sildenafil (REVATIO) 20 MG tablet May take 3-5 tabs 30 minutes prior to intercourse. 30 tablet 6   simvastatin (ZOCOR) 40 MG tablet Take 40 mg by mouth at bedtime.      solifenacin (VESICARE) 10 MG tablet Take 10 mg by mouth daily.      tamsulosin (FLOMAX) 0.4 MG CAPS capsule Take 0.4 mg by mouth daily.     Facility-Administered Medications Prior  to Visit  Medication Dose Route Frequency Provider Last Rate Last Admin   albuterol (PROVENTIL) (2.5 MG/3ML) 0.083% nebulizer solution 2.5 mg  2.5 mg Nebulization Once Swathi Dauphin S, NP         Review of Systems:   Constitutional:   No  weight loss, night sweats,  Fevers, chills,  +fatigue, or  lassitude.  HEENT:   No headaches,  Difficulty swallowing,  Tooth/dental problems, or  Sore throat,                No sneezing, itching, ear ache, +nasal congestion, post nasal drip, watery eyes   CV:  No chest pain,  Orthopnea, PND, swelling in lower extremities, anasarca, dizziness, palpitations, syncope.   GI  No heartburn, indigestion, abdominal pain, nausea, vomiting, diarrhea, change in bowel habits, loss of appetite, bloody stools.   Resp: .  No chest wall deformity  Skin: no rash or lesions.  GU: no dysuria, change in color of urine, no urgency or frequency.  No flank pain, no hematuria   MS:  No joint pain or swelling.  No decreased range of motion.  No  back pain.    Physical Exam  BP (!) 134/90 (BP Location: Left Arm, Patient Position: Sitting, Cuff Size: Large)   Pulse 79   Temp 98 F (36.7 C) (Oral)   Ht 6\' 2"  (1.88 m)   Wt 234 lb 12.8 oz (106.5 kg)   SpO2 94%   BMI 30.15 kg/m   GEN: A/Ox3; pleasant , NAD, well nourished    HEENT:  [place]/AT,  NOSE-clear, THROAT-clear, no lesions, no postnasal drip or exudate noted.   NECK:  Supple w/ fair ROM; no JVD; normal carotid impulses w/o bruits; no thyromegaly or nodules palpated; no lymphadenopathy.    RESP few scattered rhonchi  no accessory muscle use, no dullness to percussion  CARD:  Irreg/Irreg  no m/r/g, no peripheral edema, pulses intact, no cyanosis or clubbing.  GI:   Soft & nt; nml bowel sounds; no organomegaly or masses detected.   Musco: Warm bil, no deformities or joint swelling noted.   Neuro: alert, no focal deficits noted.    Skin: Warm, no lesions or rashes    Lab Results:  CBC   BNP No results found for: "BNP"  ProBNP  Imaging: NM PET Image Initial (PI) Skull Base To Thigh  Result Date: 08/21/2022 CLINICAL DATA:  Subsequent treatment strategy for non-small cell lung cancer with enlarging left upper lobe lesion on CT. History of prostate cancer. EXAM: NUCLEAR MEDICINE PET SKULL BASE TO THIGH TECHNIQUE: 11.17 mCi F-18 FDG was injected intravenously. Full-ring PET imaging was performed from the skull base to thigh after the radiotracer. CT data was obtained and used for attenuation correction and anatomic localization. Fasting blood glucose: 117 mg/dl COMPARISON:  Chest CT 07/31/2022 and 12/06/2021.  PET-CT 08/16/2020. FINDINGS: Mediastinal blood pool activity: SUV max 2.3 NECK: No hypermetabolic cervical lymph nodes are identified.Fairly symmetric activity within the lymphoid tissue of Waldeyer's ring is within physiologic limits.No suspicious activity identified within the pharyngeal mucosal space. Incidental CT findings: Chronic right temporoparietal  encephalomalacia. Bilateral carotid atherosclerosis. CHEST: There are no hypermetabolic mediastinal, hilar or axillary lymph nodes. Peripheral left upper lobe pulmonary nodule measuring 1.7 x 1.4 cm on image 65/7 is hypermetabolic with an SUV max of 8.9. This nodule is positioned more anteriorly than the patient's previously treated lesion which is unchanged, without hypermetabolic activity. No other hypermetabolic pulmonary activity or suspicious nodularity. Incidental CT findings: Atherosclerosis  of the aorta, great vessels and coronary arteries. The heart is mildly enlarged. Stable small hiatal hernia. Moderate centrilobular and paraseptal emphysema. ABDOMEN/PELVIS: There is no hypermetabolic activity within the liver, adrenal glands, spleen or pancreas. There is no hypermetabolic nodal activity in the abdomen or pelvis. Incidental CT findings: Diverticular changes throughout the colon, mild right renal cortical thinning, prostate brachytherapy seeds and a chronic large right-sided scrotal hydrocele are noted. 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 SKELETON: There is no hypermetabolic activity to suggest osseous metastatic disease. Incidental CT findings: Mild lumbar spondylosis. Stable sebaceous cyst in the left anterior chest wall. IMPRESSION: 1. The enlarging subpleural nodule peripherally in the left upper lobe is intensely hypermetabolic, most consistent with new (metachronous) lung cancer. 2. The previously treated lesion more posteriorly in the left upper lobe is stable, without recurrent metabolic activity. No evidence of metastatic disease. 3. Stable incidental findings as detailed above, including diffuse atherosclerosis and emphysema. Coronary and aortic atherosclerosis (ICD10-I70.0). Emphysema (ICD10-J43.9). Electronically Signed   By: Richardean Sale M.D.   On: 08/21/2022 12:14   CT CHEST W CONTRAST  Addendum Date:  08/01/2022   ADDENDUM REPORT: 08/01/2022 15:48 ADDENDUM: Following should be added to the impression. 4.4 cm aneurysm in the ascending thoracic aorta. Recommend annual imaging followup by CTA or MRA. This recommendation follows 2010 ACCF/AHA/AATS/ACR/ASA/SCA/SCAI/SIR/STS/SVM Guidelines for the Diagnosis and Management of Patients with Thoracic Aortic Disease. Circulation. 2010; 121: N829-F621. Aortic aneurysm NOS (ICD10-I71.9) Electronically Signed   By: Elmer Picker M.D.   On: 08/01/2022 15:48   Result Date: 08/01/2022 CLINICAL DATA:  History of lung carcinoma, left upper lobe nodule EXAM: CT CHEST WITH CONTRAST TECHNIQUE: Multidetector CT imaging of the chest was performed during intravenous contrast administration. RADIATION DOSE REDUCTION: This exam was performed according to the departmental dose-optimization program which includes automated exposure control, adjustment of the mA and/or kV according to patient size and/or use of iterative reconstruction technique. CONTRAST:  56mL OMNIPAQUE IOHEXOL 300 MG/ML  SOLN COMPARISON:  Previous studies including chest radiograph done on 05/31/2022 and CT chest done on 12/06/2021 FINDINGS: Cardiovascular: There is homogeneous enhancement in thoracic aorta. There is aneurysmal dilation of ascending thoracic aorta measuring 4.4 cm. Coronary artery calcifications are seen. There are no filling defects in central pulmonary artery branches. Mediastinum/Nodes: Subcentimeter nodes are seen in mediastinum with no significant interval change. Thyroid is smaller than usual incised. Lungs/Pleura: Centrilobular and panlobular emphysema is seen. There is 2.1 x 1.5 cm pleural-based noncalcified nodule in the lateral aspect of left upper lobe. Spiculations are seen in the margin of the lesion. There is significant interval increase in size in comparison with the CT done on 12/06/2021. There are scattered linear densities in the periphery of both lungs with no significant  interval change. There is no pleural effusion or pneumothorax. No other discrete lung nodules are seen. There is no pleural effusion or pneumothorax. Upper Abdomen: There is fatty infiltration of the liver. Spleen measures 13.6 cm in maximum diameter. Small hiatal hernia is seen. Musculoskeletal: Unremarkable. There is possible 2.5 cm sebaceous cyst in the left anterior chest wall. IMPRESSION: There is 2.1 x 1.5 cm spiculated pleural-based nodule in left upper lobe. This may suggest malignant neoplasm. Follow-up PET-CT and tissue sampling as warranted should be considered. Severe COPD.  No significant lymphadenopathy is seen. Coronary artery disease. Small hiatal hernia. Fatty liver. Enlarged spleen. Electronically Signed: By: Elmer Picker M.D. On: 08/01/2022 15:15  Latest Ref Rng & Units 06/30/2020   10:08 AM 03/19/2015    9:19 AM  PFT Results  FVC-Pre L 4.45  4.87   FVC-Predicted Pre % 91  93   FVC-Post L 4.57  4.92   FVC-Predicted Post % 94  94   Pre FEV1/FVC % % 76  74   Post FEV1/FCV % % 77  74   FEV1-Pre L 3.40  3.63   FEV1-Predicted Pre % 96  94   FEV1-Post L 3.50  3.66   DLCO uncorrected ml/min/mmHg 14.37  20.98   DLCO UNC% % 51  53   DLVA Predicted % 64  52   TLC L 7.09  7.56   TLC % Predicted % 90  94   RV % Predicted % 79  94     No results found for: "NITRICOXIDE"      Assessment & Plan:   COPD with acute exacerbation (HCC) Acute COPD exacerbation.  We will treat with empiric antibiotics and steroids.  Patient is encouraged on smoking cessation.  Plan  Patient Instructions  Doxycycline 100mg  Twice daily  for 7 days ,take with food. (Wear sunscreen)  Prednisone 20mg  daily for 5 days , take with food.  Take Zyrtec 10mg  daily  Mucinex DM Twice daily  As needed cough/congestion  Continue on BEVESPI 2 puffs Twice daily  Albuterol inhaler or neb As needed  I will call about biopsy .  Follow up with Dr. Halford Chessman in 4 weeks and As needed   Please contact  office for sooner follow up if symptoms do not improve or worsen or seek emergency care          Mass of upper lobe of left lung Enlarging left upper lobe lung mass PET positive.  Discussed CT scan and PET scan in detail with patient and daughter.  Case was discussed with Dr. Halford Chessman .  Patient had a previous CT-guided biopsy 2 years ago in 2021.  He tolerated well without any known sequela.  Lung mass is peripheral.  Patient will be referred to interventional radiology for CT-guided biopsy.  Long discussion with patient regarding potential complications including possible pneumothorax.  Patient is on Xarelto.  We discussed holding this prior to procedure per protocol .   Plan  Patient Instructions  Doxycycline 100mg  Twice daily  for 7 days ,take with food. (Wear sunscreen)  Prednisone 20mg  daily for 5 days , take with food.  Take Zyrtec 10mg  daily  Mucinex DM Twice daily  As needed cough/congestion  Continue on BEVESPI 2 puffs Twice daily  Albuterol inhaler or neb As needed  I will call about biopsy .  Follow up with Dr. Halford Chessman in 4 weeks and As needed   Please contact office for sooner follow up if symptoms do not improve or worsen or seek emergency care           I spent   46 minutes dedicated to the care of this patient on the date of this encounter to include pre-visit review of records, face-to-face time with the patient discussing conditions above, post visit ordering of testing, clinical documentation with the electronic health record, making appropriate referrals as documented, and communicating necessary findings to members of the patients care team.    Rexene Edison, NP 08/25/2022

## 2022-09-01 NOTE — Telephone Encounter (Signed)
Aortic aneurysm remains mild and stable from recent tests  J Shirley Decamp MD

## 2022-09-04 IMAGING — CT CT CHEST W/ CM
2 of 3 series · 15 of 36 positions shown, 18 images · IV contrast (Omnipaque or Isovue)
Comparison: CT chest angiogram, 07/18/2021, CT chest, 05/23/2021

CLINICAL DATA: Non-small cell lung cancer monitoring, recent
pneumonia

EXAM:
CT CHEST WITH CONTRAST
TECHNIQUE: Multidetector CT imaging of the chest was performed during
intravenous contrast administration.
CONTRAST:  75mL OMNIPAQUE IOHEXOL 300 MG/ML  SOLN

[Series 2: routine chest with · axial · 0.80mm/px · z∈[+1049,+1321]mm · 12 of 160 slices shown, 15 images]
[im 12/160  mediastinal]
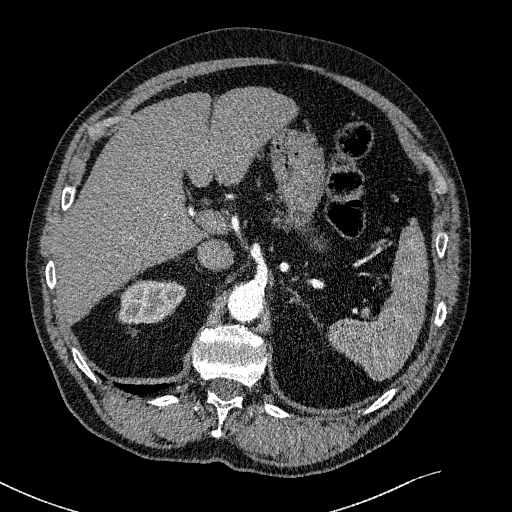
[im 12/160  lung]
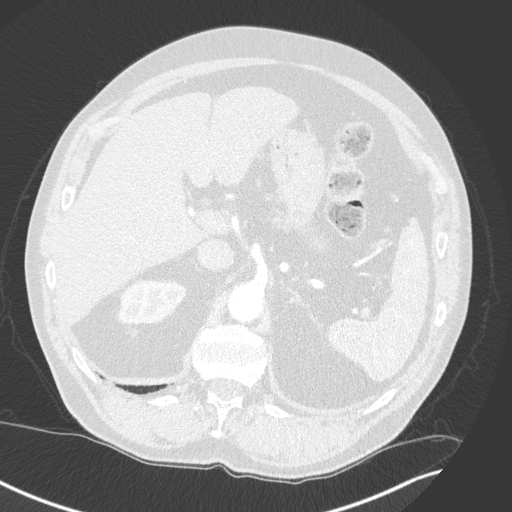
[im 24/160  lung]
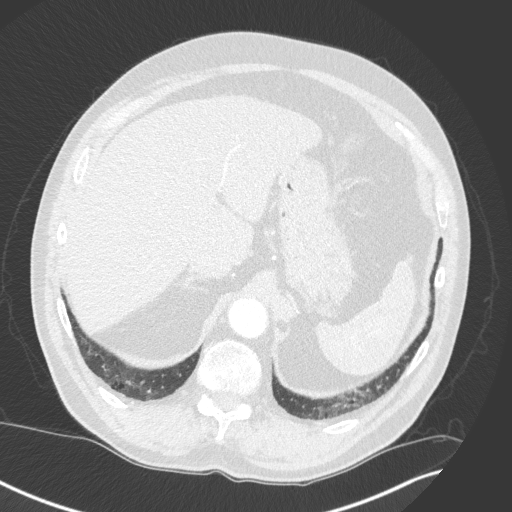
[im 36/160  lung]
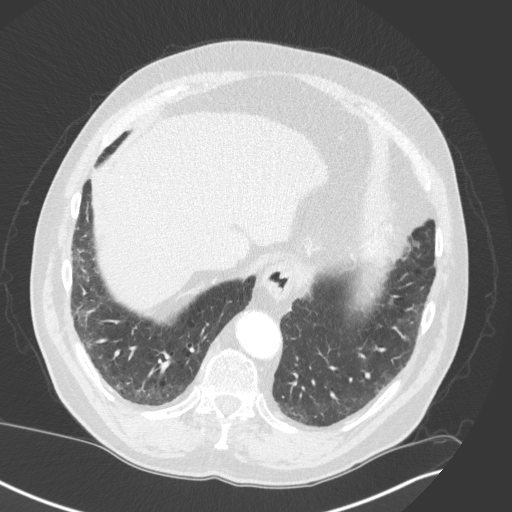
[im 48/160  lung]
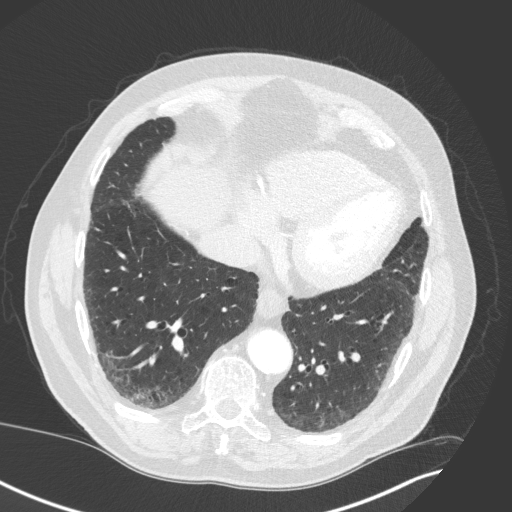
[im 59/160  mediastinal]
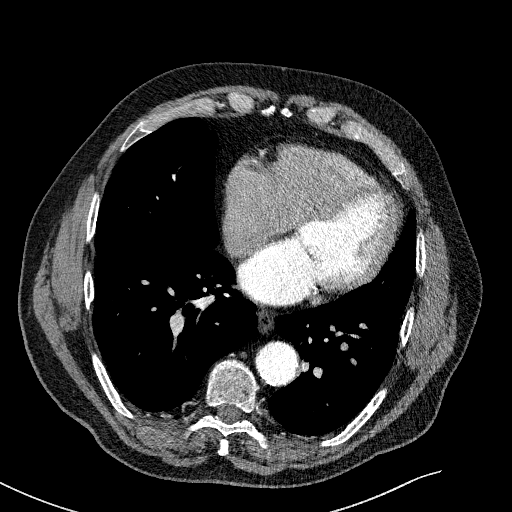
[im 59/160  lung]
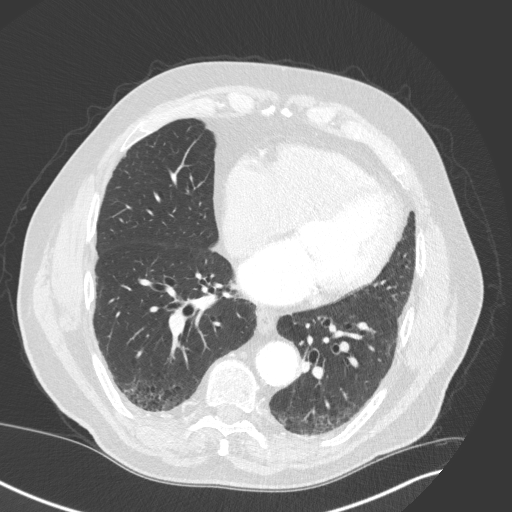
[im 71/160  lung]
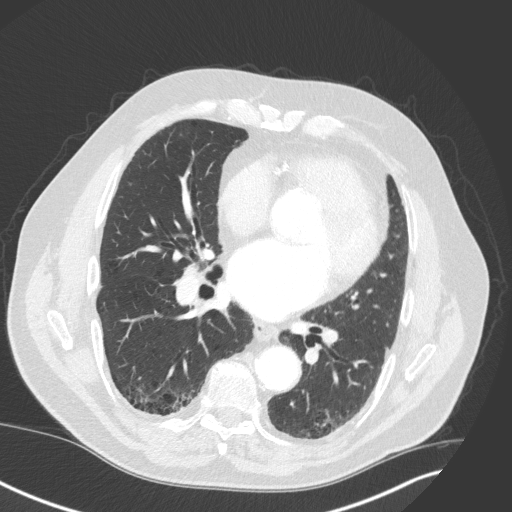
[im 89/160  lung]
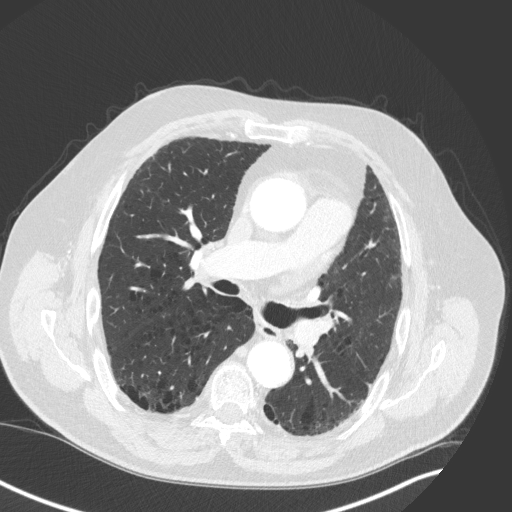
[im 101/160  lung]
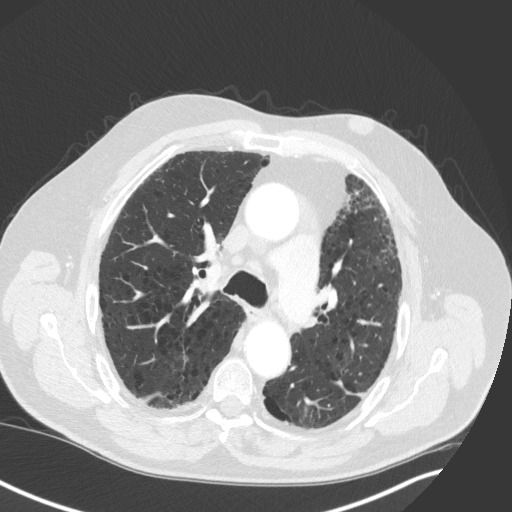
[im 112/160  mediastinal]
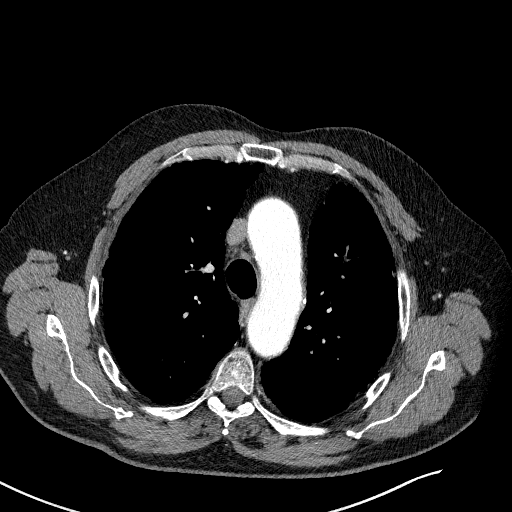
[im 112/160  lung]
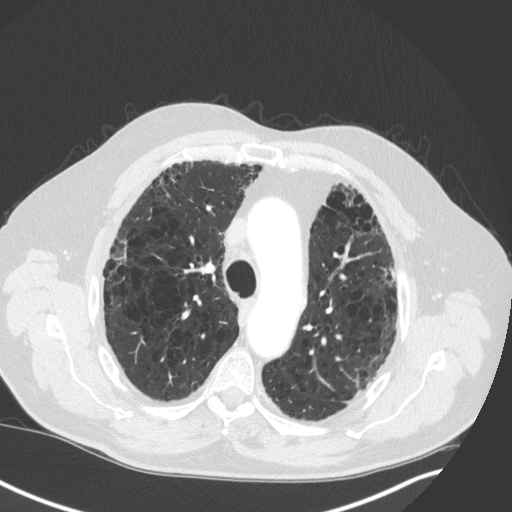
[im 124/160  lung]
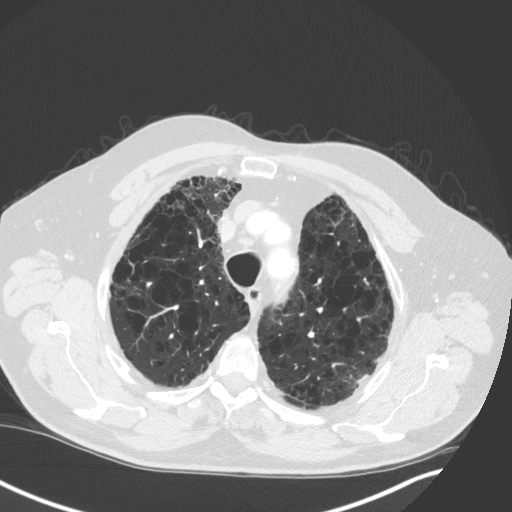
[im 136/160  lung]
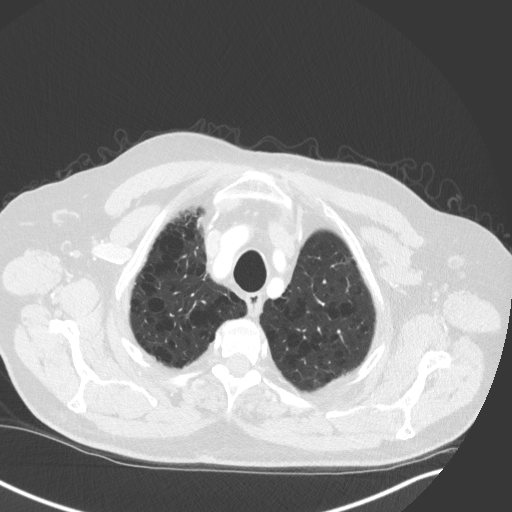
[im 148/160  lung]
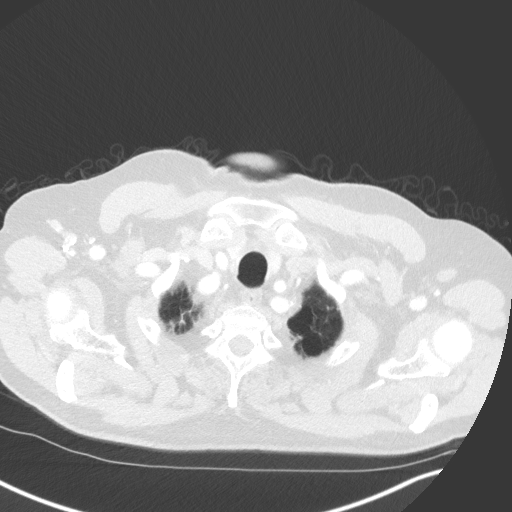

[Series 5: coronal · coronal · 0.66mm/px · 3 of 173 slices shown]
[im 35/173  lung]
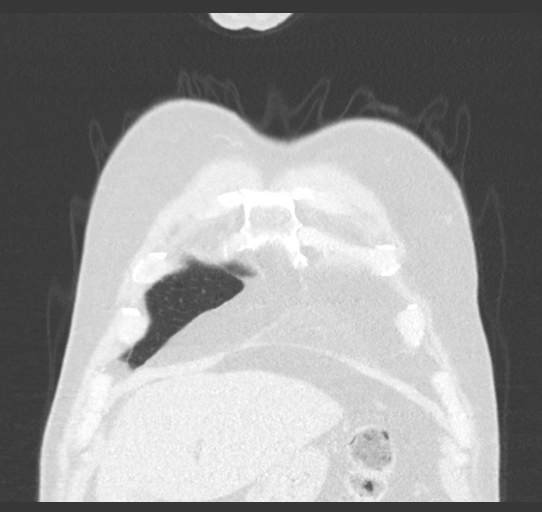
[im 69/173  lung]
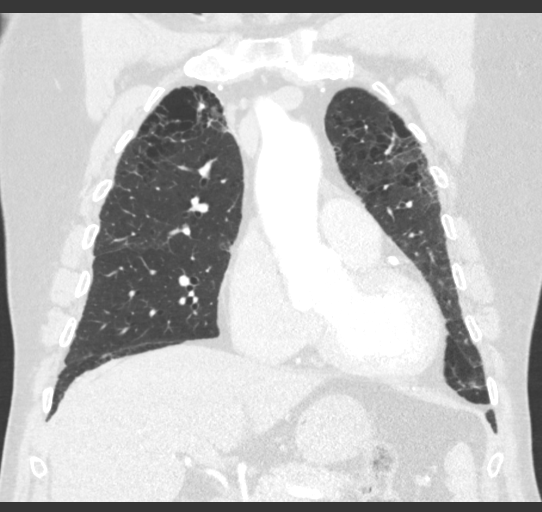
[im 104/173  lung]
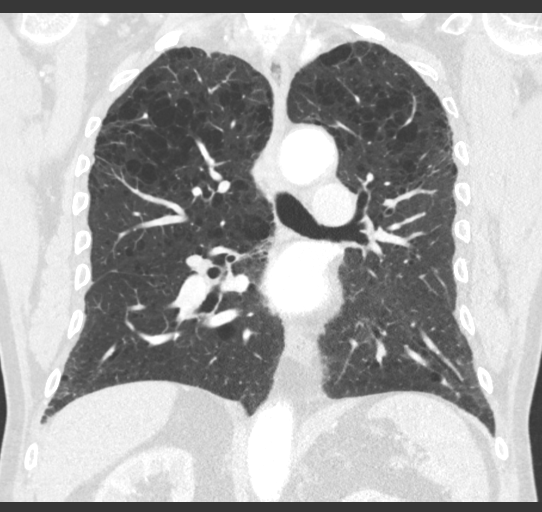

[15 of 36 positions shown; findings below may reference images not displayed]

FINDINGS: Cardiovascular: Aortic atherosclerosis. Unchanged enlargement of the
tubular ascending thoracic aorta, measuring up to 4.4 x 4.3 cm.
Normal heart size. Three-vessel coronary artery calcifications. No
pericardial effusion.

Mediastinum/Nodes: No enlarged mediastinal, hilar, or axillary lymph
nodes. Small hiatal hernia. Thyroid gland, trachea, and esophagus
demonstrate no significant findings.

Lungs/Pleura: Severe centrilobular and paraseptal emphysema.
Unchanged, post treatment appearance of a subpleural residual of a
peripheral left upper lobe mass, measuring approximately 2.4 x
cm (series 4, image 41). No pleural effusion or pneumothorax.

Upper Abdomen: No acute abnormality.

Musculoskeletal: No chest wall mass or suspicious bone lesions
identified. Unchanged subcutaneous inclusion cyst of the anterior
left chest (series 2, image 58).
IMPRESSION: 1. Unchanged, post treatment appearance of a subpleural residual of
a peripheral left upper lobe mass. No evidence of recurrent or
metastatic disease in the chest.
2. Severe emphysema.
3. Unchanged enlargement of the tubular ascending thoracic aorta
measuring up to 4.4 x 4.3 cm. Recommend annual imaging followup by
CTA or MRA if not otherwise imaged and clinically appropriate. This
recommendation follows 0282
ACCF/AHA/AATS/ACR/ASA/SCA/TAN/LIBORIO/CIOCANESCU/DELOWR Guidelines for the
Diagnosis and Management of Patients with Thoracic Aortic Disease.
Circulation. 0282; 121: E266-e369. Aortic aneurysm NOS (4D2TK-RD3.8)
4. Coronary artery disease.

Aortic Atherosclerosis (4D2TK-WKF.F) and Emphysema (4D2TK-BJW.2).

## 2022-09-07 IMAGING — US US ABDOMEN COMPLETE
1 series · 13 of 25 positions shown · non-contrast
Comparison: None.

CLINICAL DATA: Abnormal liver function tests

EXAM:
ABDOMEN ULTRASOUND COMPLETE

[Series 1: us abdomen complete · 13 of 106 slices shown]
[im 1/106]
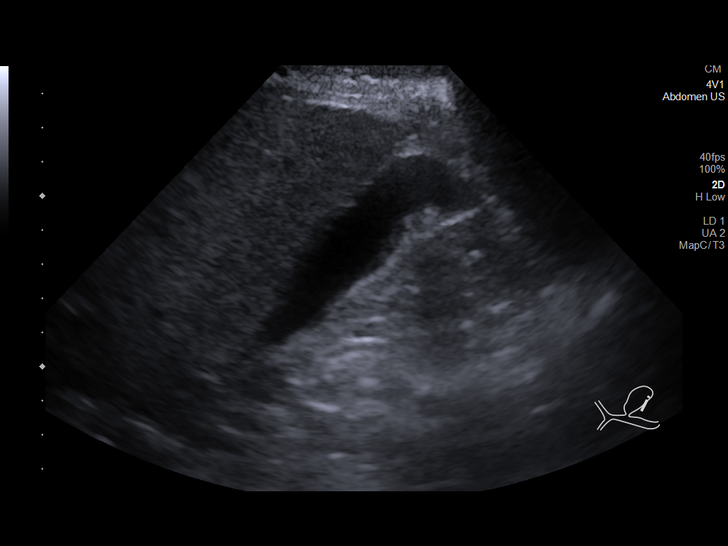
[im 9/106]
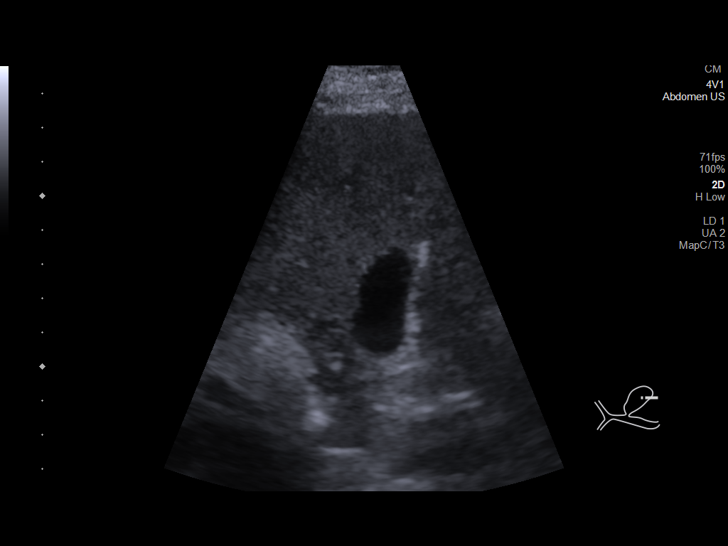
[im 18/106]
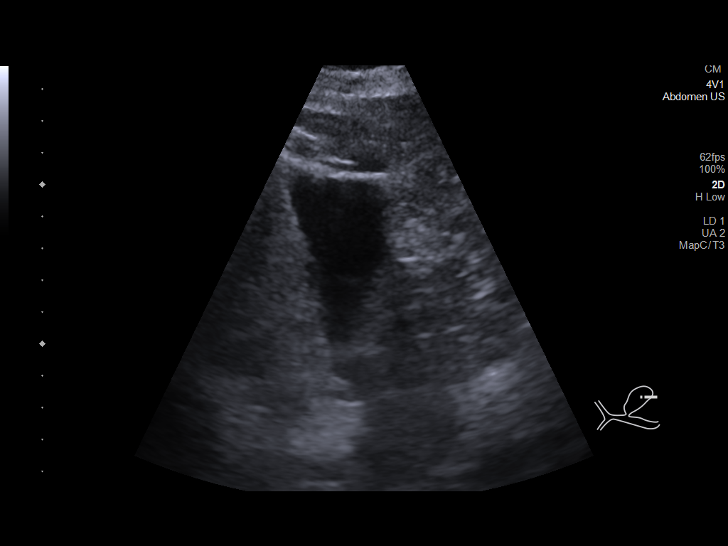
[im 27/106]
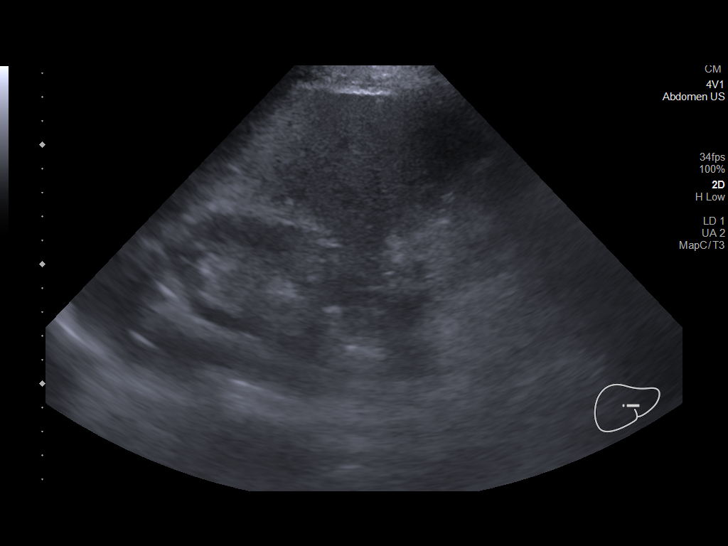
[im 36/106]
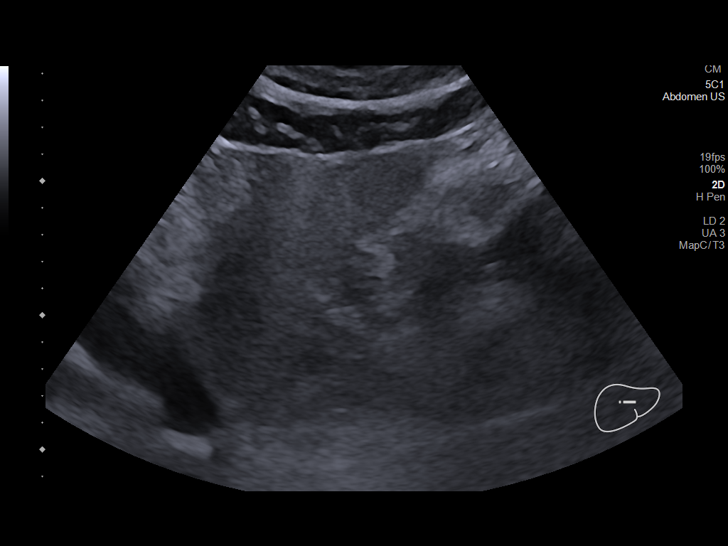
[im 44/106]
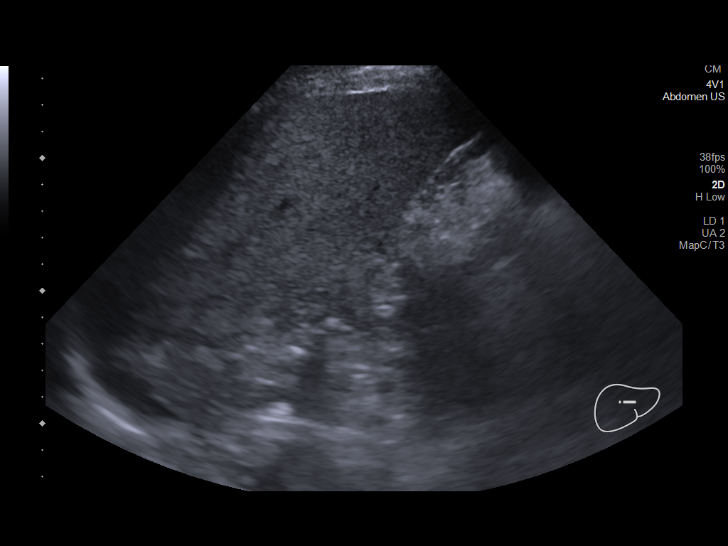
[im 53/106]
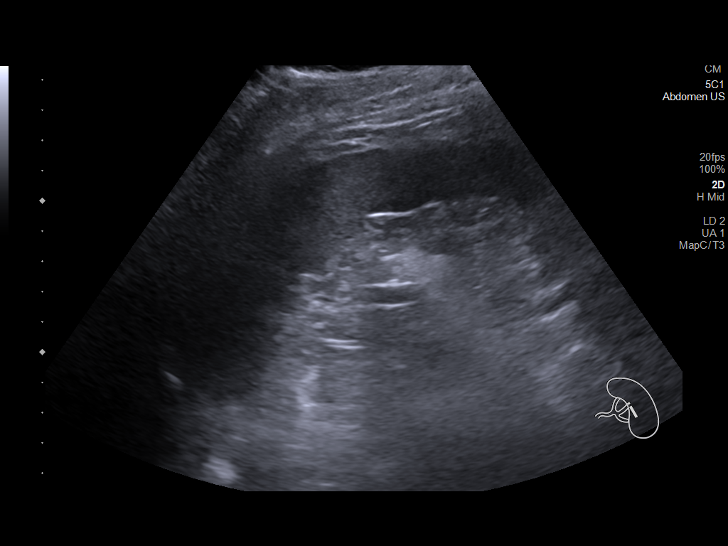
[im 62/106]
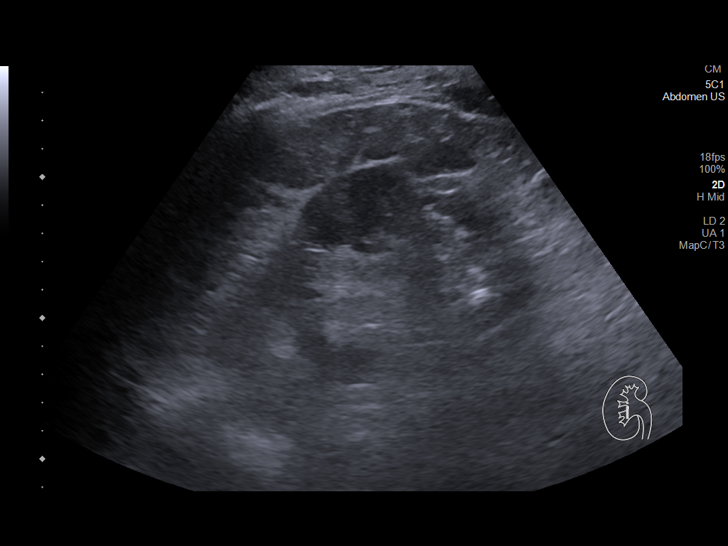
[im 71/106]
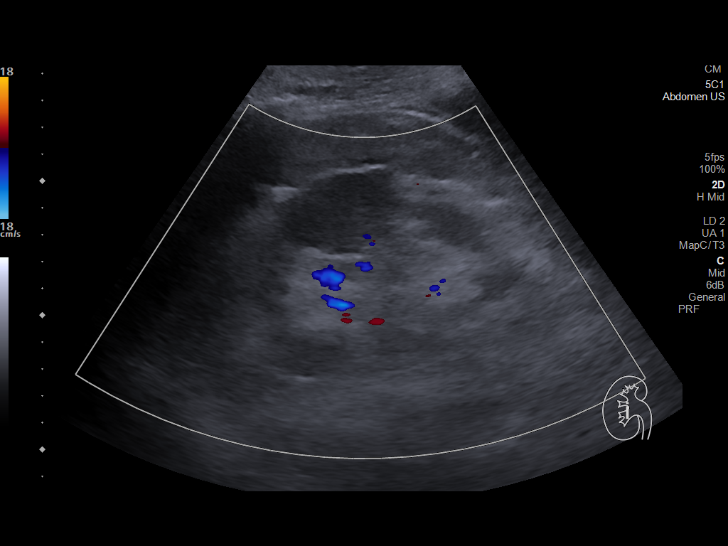
[im 79/106]
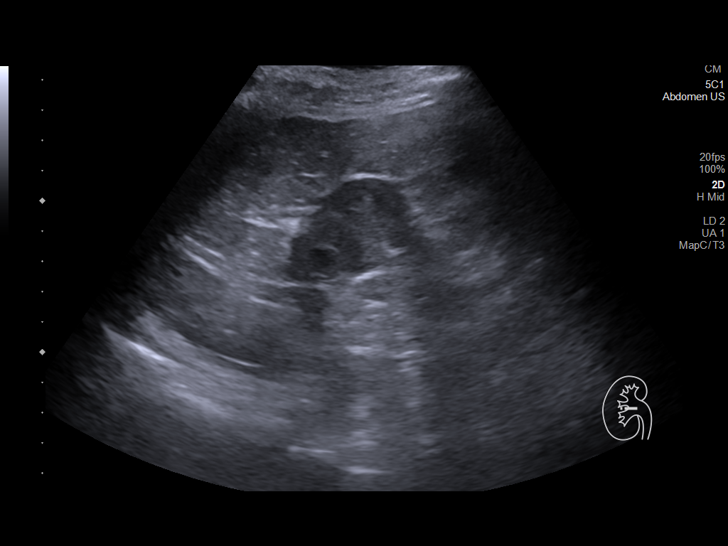
[im 88/106]
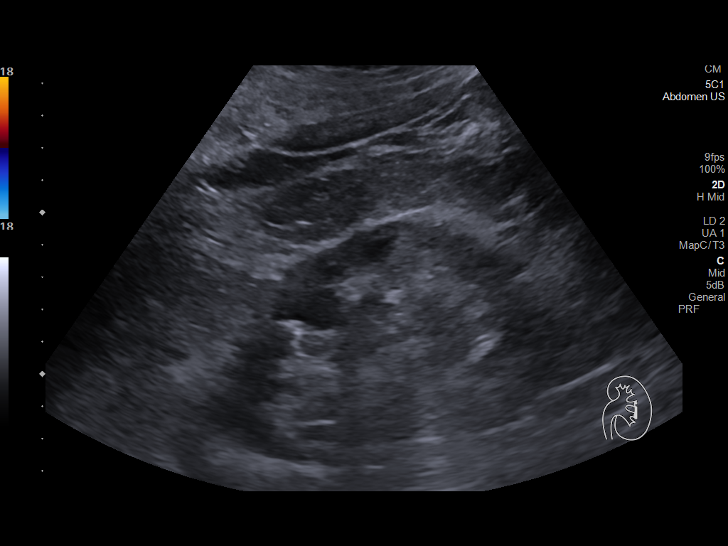
[im 97/106]
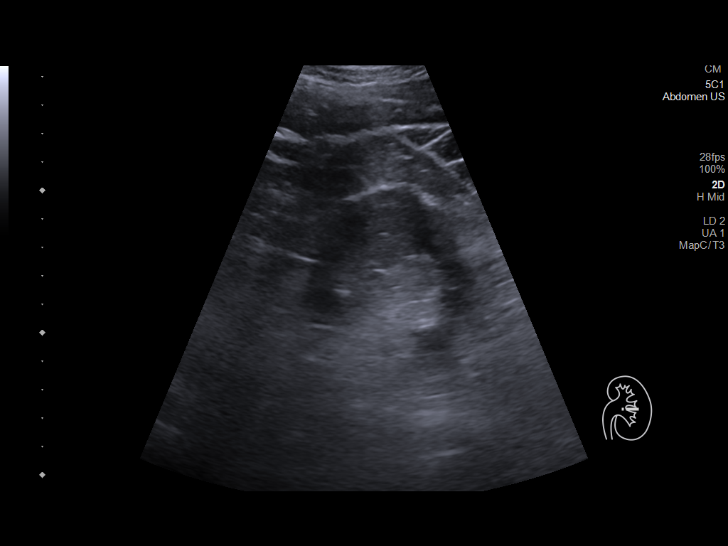
[im 106/106]
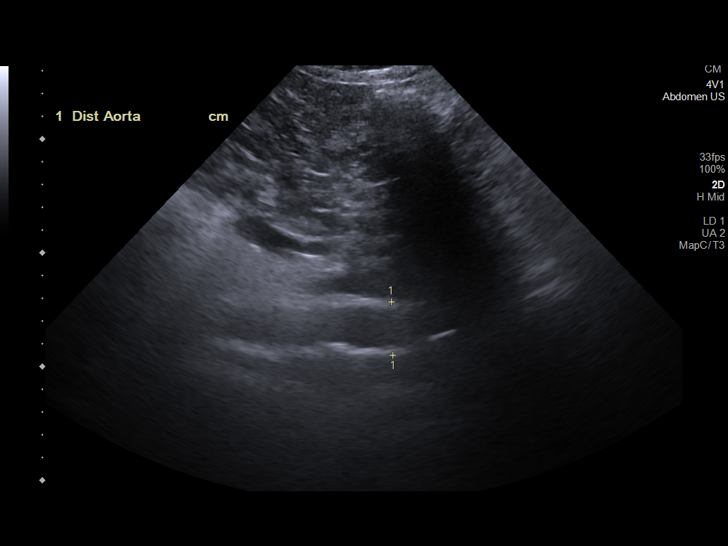

[13 of 25 positions shown; findings below may reference images not displayed]

FINDINGS: Gallbladder: No gallstones or wall thickening visualized. No
sonographic Murphy sign noted by sonographer.

Common bile duct: Diameter: 4.1 mm

Liver: There is increased echogenicity in the liver without focal
abnormalities in the visualized portions. Portal vein is patent on
color Doppler imaging with normal direction of blood flow towards
the liver.

IVC: No abnormality visualized.

Pancreas: Not adequately visualized.

Spleen: Size and appearance within normal limits.

Right Kidney: Length: 12.4 cm. There is no hydronephrosis. There is
1.3 cm hyperechoic focus without definite acoustic shadowing in the
lower pole.

Left Kidney: Length: 11.9 cm. There is no hydronephrosis. There is 8
mm hyperechoic focus without acoustic shadowing in the upper pole.

Abdominal aorta: No aneurysm visualized.

Other findings: None.
IMPRESSION: Fatty liver. There are hyperechoic foci without definite acoustic
shadowing, possibly partial volume averaging artifacts. Less likely
possibility would be nonobstructing renal stones.

Abdominal sonogram is otherwise unremarkable.

## 2022-09-12 ENCOUNTER — Telehealth: Payer: Self-pay | Admitting: Adult Health

## 2022-09-12 DIAGNOSIS — R918 Other nonspecific abnormal finding of lung field: Secondary | ICD-10-CM

## 2022-09-13 NOTE — Telephone Encounter (Signed)
Patient would like to schedule bronch procedure.  Please advise

## 2022-09-13 NOTE — Telephone Encounter (Signed)
There isn't an order for a bronch.

## 2022-09-15 NOTE — Telephone Encounter (Signed)
Spoke with the pt and notified of response per Dr Halford Chessman. He verbalized understanding. He wanted to go ahead and referral for needle bx. I placed order for this. He is aware that they will give him specific instructions of when he needs to hold Xarelto.

## 2022-09-15 NOTE — Telephone Encounter (Signed)
At visit with Jonathan Greene on 08/25/22 it was decide Jonathan Greene should have assessment by interventional radiology to arrange for percutaneous needle biopsy of his peripheral based PET positive left upper lung nodule.  This likely would be better initial approach rather than bronchoscopy given location of the lung nodule.  If order for referral to interventional radiology hasn't already been entered, then please place referral order to assess for percutaneous needle biopsy of left upper lung nodule.  Jonathan Greene does take xarelto and will need to coordinate with radiology about how long this needs to be held prior to his biopsy.

## 2022-09-15 NOTE — Telephone Encounter (Signed)
Per last ov note with TP- we were going to call pt about setting up bronch  He called and wants to schedule  I tried calling pt back to let him know we will call him about it next wk when TP returns to clinic  The line rang and then went to a busy signal   Routing to TP and VS

## 2022-09-18 NOTE — Telephone Encounter (Signed)
Order has been placed. Awaiting date .  Called to update patient , no answer . Unable to leave vm .  Please call patient to make sure he knows we are waiting on date - Can we check with IR to see when this will be done ?

## 2022-09-20 NOTE — Telephone Encounter (Signed)
Note from IR:  0/13- In review- SimpsonArne Cleveland, MD Chesley Mires, MD Cc: Roosvelt Maser High risk procedure given small nodule size and background lung disease. Very suspicious lesion. Surgical candidate? Does rad onc need tissue before treating? Might be a good case for [place]. Maryellen Pile)

## 2022-09-20 NOTE — Telephone Encounter (Signed)
Message sent via secure chat to Rhys Martini to get an update on when procedure could be done.

## 2022-09-20 NOTE — Telephone Encounter (Signed)
Message from Rexene Edison NP:  Discussing with Dr Halford Chessman may present at [facility] tomorrow

## 2022-09-20 NOTE — Telephone Encounter (Signed)
Called and spoke with patient, advised him that we are waiting on a time/date from IR.  I let him know that as soon as we have that information we will contact him.  He verbalized understanding.

## 2022-09-21 ENCOUNTER — Telehealth: Payer: Self-pay | Admitting: Acute Care

## 2022-09-21 ENCOUNTER — Other Ambulatory Visit: Payer: Self-pay | Admitting: *Deleted

## 2022-09-21 DIAGNOSIS — R911 Solitary pulmonary nodule: Secondary | ICD-10-CM

## 2022-09-21 NOTE — Progress Notes (Signed)
The proposed treatment discussed in conference is for discussion purpose only and is not a binding recommendation.  The patients have not been physically examined, or presented with their treatment options.  Therefore, final treatment plans cannot be decided.  

## 2022-09-21 NOTE — Telephone Encounter (Signed)
Pt. Was presented at Thoracic Conference this morning.   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  Previous PET  SUV in 2021 was 6.2. Additionally it is described as having spiculations in the margin of the lesion on the CT Chest with contrast.  The patient had been referred to IR for a CT Guided biopsy of the lesion by Tammy Parrett. NP , which is pleural based and in the lateral aspect of the LUL. IR reviewed the patient history and films and felt given the small nodule size, and the patient's background severe lung disease,and other co morbidities,  the procedure would be too high risk, but agreed the lesion is very suspicious. They recommended evaluating if the patient was a surgical candidate , or asking radiation oncology to treat with SBRT without  confirmed tissue  diagnosis.  Dr. Halford Chessman does not feel the patient is the best surgical candidate. Recognizing the patient had a benign needle biopsy in 2021,he would prefer definitive tissue diagnosis before putting him through radiation.   The thoracic conference participants reviewed the patient scans . Dr. Kathlene Cote was present for IR, and agreed the patient would most likely have a pneumothorax as a result of any needle biopsy attempt. He was unsure how well he would tolerate a pneumo.  Dr. Roxan Hockey was present for thoracic surgery, and agreed that the patient was not a great surgical candidate, but agreed that the nodule is concerning and suspicious.  Dr. Sondra Come said he would consider SBRT without tissue  diagnosis  Neither Dr. Valeta Harms nor Dr. Lamonte Sakai were present today , but the patient could be evaluated for navigational bronch. The lesion is very peripheral, as Tammy and I discussed, so unsure if that would even be an option.  We could always send him to Dr. Roxan Hockey and let him evaluate him for a wedge resection if the patient would consider surgery, knowing that he may be turned down as a candidate after evaluation.   The recommendations of the group were shared with Rexene Edison NP, as she and Dr. Halford Chessman are managing this patient's care. Both Rexene Edison, NP and Dr. Halford Chessman are  included in this note for completeness.

## 2022-09-25 NOTE — Telephone Encounter (Signed)
Discussed case with Dr. Valeta Harms and Dr. Halford Chessman  . Dr Valeta Harms will set up patient up  for navigational Brochoscopy .  Discussed potential risks .   Patient is on Xarelto will be held according to bronch protocol .   Called patient notified of treatment plan/dx.

## 2022-09-26 ENCOUNTER — Encounter: Payer: Self-pay | Admitting: Pulmonary Disease

## 2022-09-26 ENCOUNTER — Telehealth: Payer: Self-pay | Admitting: Pulmonary Disease

## 2022-09-26 NOTE — Telephone Encounter (Signed)
Awaiting response form Larene Beach to see if CT is needed to be scheduled. Called pt and left vm about bronch

## 2022-09-27 NOTE — Telephone Encounter (Signed)
Has the procedure been scheduled or does a message need to be sent to the procedure pool?  Please advise.  Thank you.  Received message from Larene Beach that the CT scan from August is a working scan.

## 2022-09-28 NOTE — Telephone Encounter (Signed)
Yes its on 11/7 if you go to the appt desk you can see it on the admission tab

## 2022-09-28 NOTE — Telephone Encounter (Signed)
PCCs, can you verify if this procedure has been scheduled for this patient?

## 2022-09-28 NOTE — Telephone Encounter (Signed)
Nira Conn can you find the date of procedure

## 2022-10-06 ENCOUNTER — Telehealth: Payer: Self-pay | Admitting: Pulmonary Disease

## 2022-10-06 ENCOUNTER — Other Ambulatory Visit: Payer: Self-pay | Admitting: *Deleted

## 2022-10-06 ENCOUNTER — Other Ambulatory Visit: Payer: Medicare Other

## 2022-10-06 DIAGNOSIS — Z01818 Encounter for other preprocedural examination: Secondary | ICD-10-CM

## 2022-10-06 NOTE — Telephone Encounter (Signed)
Patient called in today for prep instructions on procedure with Icard 11/7. He forgot he needs to come in today for COVID testing and stated he will try to get here to complete that ASAP. I made him aware if he does not, he will need to arrive to [facility] 3 hours prior to have that done the day of the bronch.   I will keep an eye out to see if he's seen Irma today before 3:45pm (her cut off time)  Thanks,  [employer]

## 2022-10-08 LAB — NOVEL CORONAVIRUS, NAA: SARS-CoV-2, NAA: NOT DETECTED

## 2022-10-08 LAB — SPECIMEN STATUS REPORT

## 2022-10-09 NOTE — Anesthesia Preprocedure Evaluation (Addendum)
Anesthesia Evaluation  Patient identified by MRN, date of birth, ID band Patient awake    Reviewed: Allergy & Precautions, NPO status , Patient's Chart, lab work & pertinent test results  History of Anesthesia Complications Negative for: history of anesthetic complications  Airway Mallampati: II  TM Distance: >3 FB Neck ROM: Full    Dental  (+) Edentulous Upper, Edentulous Lower   Pulmonary sleep apnea , COPD, former smoker   Pulmonary exam normal        Cardiovascular hypertension, Pt. on medications + Past MI  Normal cardiovascular exam     Neuro/Psych CVA (2011)  negative psych ROS   GI/Hepatic Neg liver ROS,GERD  Medicated and Controlled,,  Endo/Other  negative endocrine ROS    Renal/GU negative Renal ROS  negative genitourinary   Musculoskeletal  (+) Arthritis ,    Abdominal   Peds  Hematology negative hematology ROS (+)   Anesthesia Other Findings Day of surgery medications reviewed with patient.  Reproductive/Obstetrics negative OB ROS                              Anesthesia Physical Anesthesia Plan  ASA: 3  Anesthesia Plan: General   Post-op Pain Management: Minimal or no pain anticipated   Induction:   PONV Risk Score and Plan: 2 and Treatment may vary due to age or medical condition, Ondansetron and Dexamethasone  Airway Management Planned: Oral ETT  Additional Equipment: None  Intra-op Plan:   Post-operative Plan: Extubation in OR  Informed Consent: I have reviewed the patients History and Physical, chart, labs and discussed the procedure including the risks, benefits and alternatives for the proposed anesthesia with the patient or authorized representative who has indicated his/her understanding and acceptance.     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  Coronary CTA showed three-vessel disease which was nonsignificant by FFR analysis.  Last seen by Dr. Harl Bowie 06/30/2022.  Stable at that time, no changes made to management.  Former smoker with associated COPD/severe emphysema and occupational asbestos exposure. PFT 06/30/20 >> FEV1 3.50 (99%), FEV1% 77, TLC 7.09 (90%), DLCO 51%.  Found to have enlarging left upper lobe lung mass PET positive.  He is maintained on [employer].  Per telephone encounter from pulmonology 09/25/2022, patient will be instructed to hold Xarelto per bronchoscopy protocol.  Patient will need day of surgery labs and evaluation.  EKG 06/30/2022: Atrial fibrillation.  Rate 82.  CT chest 07/31/2022: IMPRESSION: There is 2.1 x 1.5 cm spiculated pleural-based nodule in left upper lobe. This may suggest malignant neoplasm. Follow-up PET-CT and tissue sampling as warranted should be considered.   Severe COPD.  No significant lymphadenopathy is seen.   Coronary artery disease. Small hiatal hernia. Fatty liver. Enlarged spleen.  CT coronary FFR analysis 02/06/2021: 1. Left Main: 0.97.   2. LAD: Proximal: 0.96, mid: 0.94, distal: 0.85. 3. LCX: 0.96. 4. OM1: 0.95 5. RCA: Not analyzed secondary to significant motion (the patient in atrial fibrillation).   IMPRESSION: 1. CT FFR analysis didn't show any significant stenosis in the LAD and LCX arteries. RCA was not analyzed secondary to significant motion while the patient was in atrial fibrillation with variable heart rate. Aggressive medical management is recommended.  TTE 06/30/2020:  1. Left  ventricular ejection fraction, by estimation, is 55 to 60%. The  left ventricle has normal function. The left ventricle has no regional  wall motion abnormalities. There is moderate left ventricular hypertrophy.  Left ventricular diastolic  parameters are indeterminate in the  setting of atrial fibrillation.   2. Right ventricular systolic function is normal. The right ventricular  size is normal.   3. Left atrial size was mildly dilated.   4. The mitral valve is grossly normal. Trivial mitral valve  regurgitation.   5. The aortic valve is tricuspid. Aortic valve regurgitation is not  visualized. Mild aortic valve sclerosis is present, with no evidence of  aortic valve stenosis.   6. The inferior vena cava is normal in size with greater than 50%  respiratory variability, suggesting right atrial pressure of 3 mmHg.    )         Anesthesia Quick Evaluation

## 2022-10-09 NOTE — Progress Notes (Signed)
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  Coronary CTA showed three-vessel disease which was nonsignificant by FFR analysis.  Last seen by Dr. Harl Bowie 06/30/2022.  Stable at that time, no changes made to management.  Former smoker with associated COPD/severe emphysema and occupational asbestos exposure. PFT 06/30/20 >> FEV1 3.50 (99%), FEV1% 77, TLC 7.09 (90%), DLCO 51%.  Found to have enlarging left upper lobe lung mass PET positive.  He is maintained on [employer].  Per telephone encounter from pulmonology 09/25/2022, patient will be instructed to hold Xarelto per bronchoscopy protocol.  Patient will need day of surgery labs and evaluation.  EKG 06/30/2022: Atrial fibrillation.  Rate 82.  CT chest 07/31/2022: IMPRESSION: There is 2.1 x 1.5 cm spiculated pleural-based nodule in left upper lobe. This may suggest malignant neoplasm. Follow-up PET-CT and tissue sampling as warranted should be considered.   Severe COPD.  No significant lymphadenopathy is seen.   Coronary artery disease. Small hiatal hernia. Fatty liver. Enlarged spleen.  CT coronary FFR analysis 02/06/2021: 1. Left Main: 0.97.   2. LAD: Proximal: 0.96, mid: 0.94, distal: 0.85. 3. LCX: 0.96. 4. OM1: 0.95 5. RCA: Not analyzed secondary to significant motion (the patient in atrial fibrillation).   IMPRESSION: 1. CT FFR analysis didn't show any significant stenosis in the LAD and LCX arteries. RCA was not analyzed secondary to significant motion while the patient was in atrial fibrillation with variable heart rate. Aggressive medical management is recommended.  TTE 06/30/2020:  1. Left ventricular ejection fraction, by estimation, is 55 to 60%. The  left ventricle has normal function. The left ventricle has no regional  wall motion abnormalities. There is moderate left ventricular  hypertrophy.  Left ventricular diastolic  parameters are indeterminate in the setting of atrial fibrillation.   2. Right ventricular systolic function is normal. The right ventricular  size is normal.   3. Left atrial size was mildly dilated.   4. The mitral valve is grossly normal. Trivial mitral valve  regurgitation.   5. The aortic valve is tricuspid. Aortic valve regurgitation is not  visualized. Mild aortic valve sclerosis is present, with no evidence of  aortic valve stenosis.   6. The inferior vena cava is normal in size with greater than 50%  respiratory variability, suggesting right atrial pressure of 3 mmHg.     Wynonia Musty [facility] Stay [facility]/Anesthesiology Phone [phone] 10/09/2022 2:04 PM

## 2022-10-10 DIAGNOSIS — R911 Solitary pulmonary nodule: Secondary | ICD-10-CM | POA: Insufficient documentation

## 2022-10-19 ENCOUNTER — Ambulatory Visit: Payer: Medicare Other | Admitting: Pulmonary Disease

## 2022-10-23 ENCOUNTER — Other Ambulatory Visit: Payer: Self-pay

## 2022-10-23 ENCOUNTER — Encounter (HOSPITAL_COMMUNITY): Payer: Self-pay | Admitting: Pulmonary Disease

## 2022-10-23 NOTE — Progress Notes (Addendum)
Spoke with pt for pre-op call. Pt  has hx of A-fib and is on Xarelto. Pt's cardiologist is Dr. Harl Bowie. Last dose was 11/17 PM dose. 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 Last A1C was 6.3 on 01/18/22.   Shower instructions given to pt and he voiced understanding.   Covid test to be done DOS.  See Jonathan Greene' note dated 10/09/22

## 2022-10-24 ENCOUNTER — Ambulatory Visit (HOSPITAL_COMMUNITY): Payer: Medicare Other | Admitting: Physician Assistant

## 2022-10-24 ENCOUNTER — Encounter (HOSPITAL_COMMUNITY)
Admission: RE | Disposition: A | Discharge status: Home / Self Care | Payer: Self-pay | Source: Home / Self Care | Attending: Pulmonary Disease

## 2022-10-24 ENCOUNTER — Encounter (HOSPITAL_COMMUNITY): Payer: Self-pay | Admitting: Pulmonary Disease

## 2022-10-24 ENCOUNTER — Ambulatory Visit (HOSPITAL_COMMUNITY): Payer: Medicare Other

## 2022-10-24 ENCOUNTER — Ambulatory Visit (HOSPITAL_COMMUNITY)
Admission: RE | Admit: 2022-10-24 | Discharge: 2022-10-24 | Disposition: A | Discharge status: Home / Self Care | Payer: Medicare Other | Attending: Pulmonary Disease | Admitting: Pulmonary Disease

## 2022-10-24 ENCOUNTER — Ambulatory Visit (HOSPITAL_BASED_OUTPATIENT_CLINIC_OR_DEPARTMENT_OTHER): Payer: Medicare Other | Admitting: Physician Assistant

## 2022-10-24 ENCOUNTER — Other Ambulatory Visit: Payer: Self-pay

## 2022-10-24 DIAGNOSIS — I10 Essential (primary) hypertension: Secondary | ICD-10-CM | POA: Diagnosis not present

## 2022-10-24 DIAGNOSIS — I2510 Atherosclerotic heart disease of native coronary artery without angina pectoris: Secondary | ICD-10-CM | POA: Insufficient documentation

## 2022-10-24 DIAGNOSIS — Z85118 Personal history of other malignant neoplasm of bronchus and lung: Secondary | ICD-10-CM | POA: Insufficient documentation

## 2022-10-24 DIAGNOSIS — I4891 Unspecified atrial fibrillation: Secondary | ICD-10-CM | POA: Insufficient documentation

## 2022-10-24 DIAGNOSIS — R911 Solitary pulmonary nodule: Secondary | ICD-10-CM

## 2022-10-24 DIAGNOSIS — G4730 Sleep apnea, unspecified: Secondary | ICD-10-CM | POA: Insufficient documentation

## 2022-10-24 DIAGNOSIS — E7800 Pure hypercholesterolemia, unspecified: Secondary | ICD-10-CM | POA: Insufficient documentation

## 2022-10-24 DIAGNOSIS — Z8673 Personal history of transient ischemic attack (TIA), and cerebral infarction without residual deficits: Secondary | ICD-10-CM | POA: Diagnosis not present

## 2022-10-24 DIAGNOSIS — J449 Chronic obstructive pulmonary disease, unspecified: Secondary | ICD-10-CM | POA: Diagnosis not present

## 2022-10-24 DIAGNOSIS — Z87891 Personal history of nicotine dependence: Secondary | ICD-10-CM

## 2022-10-24 DIAGNOSIS — I252 Old myocardial infarction: Secondary | ICD-10-CM

## 2022-10-24 DIAGNOSIS — Z1152 Encounter for screening for COVID-19: Secondary | ICD-10-CM | POA: Insufficient documentation

## 2022-10-24 DIAGNOSIS — K219 Gastro-esophageal reflux disease without esophagitis: Secondary | ICD-10-CM | POA: Diagnosis not present

## 2022-10-24 DIAGNOSIS — Z8546 Personal history of malignant neoplasm of prostate: Secondary | ICD-10-CM | POA: Insufficient documentation

## 2022-10-24 HISTORY — DX: Cardiac arrhythmia, unspecified: I49.9

## 2022-10-24 HISTORY — DX: Pneumonia, unspecified organism: J18.9

## 2022-10-24 HISTORY — DX: Dyspnea, unspecified: R06.00

## 2022-10-24 HISTORY — PX: BRONCHIAL BIOPSY: SHX5109

## 2022-10-24 HISTORY — PX: BRONCHIAL WASHINGS: SHX5105

## 2022-10-24 HISTORY — DX: Unspecified osteoarthritis, unspecified site: M19.90

## 2022-10-24 HISTORY — PX: BRONCHIAL NEEDLE ASPIRATION BIOPSY: SHX5106

## 2022-10-24 LAB — BASIC METABOLIC PANEL
Anion gap: 13 (ref 5–15)
BUN: 12 mg/dL (ref 8–23)
CO2: 22 mmol/L (ref 22–32)
Calcium: 9.2 mg/dL (ref 8.9–10.3)
Chloride: 103 mmol/L (ref 98–111)
Creatinine, Ser: 1.09 mg/dL (ref 0.61–1.24)
GFR, Estimated: >60 mL/min (ref >60)
Glucose, Bld: 113 mg/dL — ABNORMAL HIGH (ref 70–99)
Potassium: 3.9 mmol/L (ref 3.5–5.1)
Sodium: 138 mmol/L (ref 135–145)

## 2022-10-24 LAB — SARS CORONAVIRUS 2 BY RT PCR: SARS Coronavirus 2 by RT PCR: NEGATIVE

## 2022-10-24 LAB — CBC
HCT: 47.5 % (ref 39.0–52.0)
Hemoglobin: 16.3 g/dL (ref 13.0–17.0)
MCH: 32.9 pg (ref 26.0–34.0)
MCHC: 34.3 g/dL (ref 30.0–36.0)
MCV: 95.8 fL (ref 80.0–100.0)
Platelets: 159 10*3/uL (ref 150–400)
RBC: 4.96 MIL/uL (ref 4.22–5.81)
RDW: 13.2 % (ref 11.5–15.5)
WBC: 8.2 10*3/uL (ref 4.0–10.5)
nRBC: 0 % (ref 0.0–0.2)

## 2022-10-24 SURGERY — BRONCHOSCOPY, WITH BIOPSY USING ELECTROMAGNETIC NAVIGATION
Anesthesia: General | Laterality: Left

## 2022-10-24 MED ORDER — ROCURONIUM BROMIDE 10 MG/ML (PF) SYRINGE
PREFILLED_SYRINGE | INTRAVENOUS | Status: DC | PRN
  Administered 2022-10-24: 10 mg via INTRAVENOUS
  Administered 2022-10-24: 50 mg via INTRAVENOUS

## 2022-10-24 MED ORDER — LACTATED RINGERS IV SOLN
INTRAVENOUS | Status: DC
Start: 2022-10-24 — End: 2022-10-24

## 2022-10-24 MED ORDER — FENTANYL CITRATE (PF) 250 MCG/5ML IJ SOLN
INTRAMUSCULAR | Status: DC | PRN
  Administered 2022-10-24: 100 ug via INTRAVENOUS

## 2022-10-24 MED ORDER — CHLORHEXIDINE GLUCONATE 0.12 % MT SOLN
OROMUCOSAL | Status: AC
  Filled 2022-10-24: qty 15

## 2022-10-24 MED ORDER — ONDANSETRON HCL 4 MG/2ML IJ SOLN
INTRAMUSCULAR | Status: DC | PRN
  Administered 2022-10-24: 4 mg via INTRAVENOUS

## 2022-10-24 MED ORDER — DEXAMETHASONE SODIUM PHOSPHATE 10 MG/ML IJ SOLN
INTRAMUSCULAR | Status: DC | PRN
  Administered 2022-10-24: 10 mg via INTRAVENOUS

## 2022-10-24 MED ORDER — CHLORHEXIDINE GLUCONATE 0.12 % MT SOLN
15.0000 mL | Freq: Once | OROMUCOSAL | Status: AC
  Administered 2022-10-24: 15 mL via OROMUCOSAL

## 2022-10-24 MED ORDER — PROPOFOL 10 MG/ML IV BOLUS
INTRAVENOUS | Status: DC | PRN
  Administered 2022-10-24: 20 mg via INTRAVENOUS
  Administered 2022-10-24: 130 mg via INTRAVENOUS

## 2022-10-24 MED ORDER — EPHEDRINE SULFATE-NACL 50-0.9 MG/10ML-% IV SOSY
PREFILLED_SYRINGE | INTRAVENOUS | Status: DC | PRN
  Administered 2022-10-24: 10 mg via INTRAVENOUS

## 2022-10-24 MED ORDER — LIDOCAINE 2% (20 MG/ML) 5 ML SYRINGE
INTRAMUSCULAR | Status: DC | PRN
  Administered 2022-10-24: 100 mg via INTRAVENOUS

## 2022-10-24 MED ORDER — PHENYLEPHRINE HCL-NACL 20-0.9 MG/250ML-% IV SOLN
INTRAVENOUS | Status: DC | PRN
  Administered 2022-10-24: 20 ug/min via INTRAVENOUS

## 2022-10-24 MED ORDER — SUGAMMADEX SODIUM 200 MG/2ML IV SOLN
INTRAVENOUS | Status: DC | PRN
  Administered 2022-10-24: 220 mg via INTRAVENOUS

## 2022-10-24 SURGICAL SUPPLY — 29 items
BRUSH CYTOL CELLEBRITY 1.5X140 (MISCELLANEOUS) IMPLANT
CANISTER SUCT 3000ML PPV (MISCELLANEOUS) ×4 IMPLANT
CONT SPEC 4OZ CLIKSEAL STRL BL (MISCELLANEOUS) ×4 IMPLANT
COVER BACK TABLE 60X90IN (DRAPES) ×4 IMPLANT
COVER DOME SNAP 22 D (MISCELLANEOUS) ×4 IMPLANT
FORCEPS BIOP RJ4 1.8 (CUTTING FORCEPS) IMPLANT
GAUZE SPONGE 4X4 12PLY STRL (GAUZE/BANDAGES/DRESSINGS) ×4 IMPLANT
GLOVE BIO SURGEON STRL SZ7.5 (GLOVE) ×4 IMPLANT
GOWN STRL REUS W/ TWL LRG LVL3 (GOWN DISPOSABLE) ×4 IMPLANT
GOWN STRL REUS W/TWL LRG LVL3 (GOWN DISPOSABLE) ×3
KIT CLEAN ENDO COMPLIANCE (KITS) ×8 IMPLANT
KIT TURNOVER KIT B (KITS) ×4 IMPLANT
MARKER SKIN DUAL TIP RULER LAB (MISCELLANEOUS) ×4 IMPLANT
NDL EBUS SONO TIP PENTAX (NEEDLE) ×3 IMPLANT
NEEDLE EBUS SONO TIP PENTAX (NEEDLE) ×3 IMPLANT
NS IRRIG 1000ML POUR BTL (IV SOLUTION) ×4 IMPLANT
OIL SILICONE PENTAX (PARTS (SERVICE/REPAIRS)) ×4 IMPLANT
PAD ARMBOARD 7.5X6 YLW CONV (MISCELLANEOUS) ×8 IMPLANT
SOL ANTI FOG 6CC (MISCELLANEOUS) ×4 IMPLANT
SYR 20CC LL (SYRINGE) ×8 IMPLANT
SYR 20ML ECCENTRIC (SYRINGE) ×8 IMPLANT
SYR 50ML SLIP (SYRINGE) IMPLANT
SYR 5ML LUER SLIP (SYRINGE) ×4 IMPLANT
TOWEL OR 17X24 6PK STRL BLUE (TOWEL DISPOSABLE) ×4 IMPLANT
TRAP SPECIMEN MUCOUS 40CC (MISCELLANEOUS) IMPLANT
TUBE CONNECTING 20X1/4 (TUBING) ×8 IMPLANT
UNDERPAD 30X30 (UNDERPADS AND DIAPERS) ×4 IMPLANT
VALVE DISPOSABLE (MISCELLANEOUS) ×4 IMPLANT
WATER STERILE IRR 1000ML POUR (IV SOLUTION) ×4 IMPLANT

## 2022-10-24 NOTE — Interval H&P Note (Signed)
History and Physical Interval Note:  10/24/2022 11:48 AM  Jonathan Greene  has presented today for surgery, with the diagnosis of lung nodule.  The various methods of treatment have been discussed with the patient and family. After consideration of risks, benefits and other options for treatment, the patient has consented to  Procedure(s) with comments: ROBOTIC ASSISTED NAVIGATIONAL BRONCHOSCOPY (Left) - ION w/ CIOS VIDEO BRONCHOSCOPY WITH ENDOBRONCHIAL ULTRASOUND (Bilateral) as a surgical intervention.  The patient's history has been reviewed, patient examined, no change in status, stable for surgery.  I have reviewed the patient's chart and labs.  Questions were answered to the patient's satisfaction.     Baker

## 2022-10-24 NOTE — Discharge Instructions (Signed)
Flexible Bronchoscopy, Care After This sheet gives you information about how to care for yourself after your test. Your doctor may also give you more specific instructions. If you have problems or questions, contact your doctor. Follow these instructions at home: Eating and drinking Do not eat or drink anything (not even water) for 2 hours after your test, or until your numbing medicine (local anesthetic) wears off. When your numbness is gone and your cough and gag reflexes have come back, you may: Eat only soft foods. Slowly drink liquids. The day after the test, go back to your normal diet. Driving Do not drive for 24 hours if you were given a medicine to help you relax (sedative). Do not drive or use heavy machinery while taking prescription pain medicine. General instructions  Take over-the-counter and prescription medicines only as told by your doctor. Return to your normal activities as told. Ask what activities are safe for you. Do not use any products that have nicotine or tobacco in them. This includes cigarettes and e-cigarettes. If you need help quitting, ask your doctor. Keep all follow-up visits as told by your doctor. This is important. It is very important if you had a tissue sample (biopsy) taken. Get help right away if: You have shortness of breath that gets worse. You get light-headed. You feel like you are going to pass out (faint). You have chest pain. You cough up: More than a little blood. More blood than before. Summary Do not eat or drink anything (not even water) for 2 hours after your test, or until your numbing medicine wears off. Do not use cigarettes. Do not use e-cigarettes. Get help right away if you have chest pain.  This information is not intended to replace advice given to you by your health care provider. Make sure you discuss any questions you have with your health care provider. Document Released: 09/17/2009 Document Revised: 11/02/2017 Document  Reviewed: 12/08/2016 Elsevier Patient Education  2020 [employer].

## 2022-10-24 NOTE — Op Note (Signed)
Video Bronchoscopy with Robotic Assisted Bronchoscopic Navigation   Date of Operation: 10/24/2022   Pre-op Diagnosis: LUL lung nodule   Post-op Diagnosis: LUL lung nodule   Surgeon: Garner Nash, DO   Assistants: None   Anesthesia: General endotracheal anesthesia  Operation: Flexible video fiberoptic bronchoscopy with robotic assistance and biopsies.  Estimated Blood Loss: Minimal  Complications: None  Indications and History: Jonathan Greene is a 81 y.o. male with history of LUL lung nodule. The risks, benefits, complications, treatment options and expected outcomes were discussed with the patient.  The possibilities of pneumothorax, pneumonia, reaction to medication, pulmonary aspiration, perforation of a viscus, bleeding, failure to diagnose a condition and creating a complication requiring transfusion or operation were discussed with the patient who freely signed the consent.    Description of Procedure: The patient was seen in the Preoperative Area, was examined and was deemed appropriate to proceed.  The patient was taken to [facility] endoscopy room 3, identified as Jonathan Greene and the procedure verified as Flexible Video Fiberoptic Bronchoscopy.  A Time Out was held and the above information confirmed.   Prior to the date of the procedure a high-resolution CT scan of the chest was performed. Utilizing ION software program a virtual tracheobronchial tree was generated to allow the creation of distinct navigation pathways to the patient's parenchymal abnormalities. After being taken to the operating room general anesthesia was initiated and the patient  was orally intubated. The video fiberoptic bronchoscope was introduced via the endotracheal tube and a general inspection was performed which showed normal right and left lung anatomy, aspiration of the bilateral mainstems was completed to remove any remaining secretions. Robotic catheter inserted into patient's endotracheal tube.    Target #1 LUL lung nodule : The distinct navigation pathways prepared prior to this procedure were then utilized to navigate to patient's lesion identified on CT scan. The robotic catheter was secured into place and the vision probe was withdrawn.  Lesion location was approximated using fluoroscopy and 3-dimensional, and CT imaging for peripheral targeting. Under fluoroscopic guidance transbronchial needle biopsies, and transbronchial forceps biopsies were performed to be sent for cytology and pathology.  With multiple negative calls on Rose evaluation in the room decision was made for a repeat three-dimensional CT image.  At that time the lesion and the parenchyma surrounding the lesion did show associated hemorrhage. A bronchioalveolar lavage was performed in the LUL and sent for microbiology.  Following BAL from the robotic catheter we switched to the standard therapeutic bronchoscope.  Saline was used for irrigation of the bilateral mainstem's and therapeutic aspiration was necessary to remove blood clots from the bilateral lower lobe mainstem's.  There is no significant active bleeding at the termination of the procedure.  At the end of the procedure a general airway inspection was performed and there was no evidence of active bleeding. The bronchoscope was removed.  The patient tolerated the procedure well. There was no significant blood loss and there were no obvious complications. A post-procedural chest x-ray is pending.  Samples Target #1 1. Transbronchial Wang needle biopsies from left upper lobe 2. Transbronchial forceps biopsies from left upper lobe 3. Bronchoalveolar lavage from left upper lobe  Plans:  The patient will be discharged from the PACU to home when recovered from anesthesia and after chest x-ray is reviewed. We will review the cytology, pathology and microbiology results with the patient when they become available. Outpatient followup will be with Garner Nash,  DO.  Jonathan Greene  Jonathan Wetherington, DO [facility] 10/24/2022 1:25 PM

## 2022-10-24 NOTE — Anesthesia Postprocedure Evaluation (Signed)
Anesthesia Post Note  Patient: Jonathan Greene  Procedure(s) Performed: ROBOTIC ASSISTED NAVIGATIONAL BRONCHOSCOPY (Left) BRONCHIAL NEEDLE ASPIRATION BIOPSIES BRONCHIAL BIOPSIES BRONCHIAL WASHINGS     Patient location during evaluation: PACU Anesthesia Type: General Level of consciousness: awake and alert Pain management: pain level controlled Vital Signs Assessment: post-procedure vital signs reviewed and stable Respiratory status: spontaneous breathing, nonlabored ventilation and respiratory function stable Cardiovascular status: blood pressure returned to baseline Postop Assessment: no apparent nausea or vomiting Anesthetic complications: no   No notable events documented.  Last Vitals:  Vitals:   10/24/22 1345 10/24/22 1400  BP: 114/78 109/84  Pulse: 76 72  Resp: (!) 24 14  Temp:    SpO2: 95% 99%    Last Pain:  Vitals:   10/24/22 1330  TempSrc:   PainSc: 0-No pain                 Marthenia Rolling

## 2022-10-24 NOTE — Transfer of Care (Signed)
Immediate Anesthesia Transfer of Care Note  Patient: Jonathan Greene  Procedure(s) Performed: ROBOTIC ASSISTED NAVIGATIONAL BRONCHOSCOPY (Left) BRONCHIAL NEEDLE ASPIRATION BIOPSIES BRONCHIAL BIOPSIES BRONCHIAL WASHINGS  Patient Location: PACU  Anesthesia Type:General  Level of Consciousness: awake, alert , and oriented  Airway & Oxygen Therapy: Patient Spontanous Breathing and Patient connected to nasal cannula oxygen  Post-op Assessment: Report given to RN and Post -op Vital signs reviewed and stable  Post vital signs: Reviewed and stable  Last Vitals:  Vitals Value Taken Time  BP 100/77 10/24/22 1330  Temp 36.7 C 10/24/22 1330  Pulse 74 10/24/22 1335  Resp 25 10/24/22 1335  SpO2 89 % 10/24/22 1335  Vitals shown include unvalidated device data.  Last Pain:  Vitals:   10/24/22 1330  TempSrc:   PainSc: 0-No pain         Complications: No notable events documented.

## 2022-10-24 NOTE — Anesthesia Procedure Notes (Signed)
Procedure Name: Intubation Date/Time: 10/24/2022 12:32 PM  Performed by: Anastasio Auerbach, CRNAPre-anesthesia Checklist: Patient identified, Emergency Drugs available, Suction available and Patient being monitored Patient Re-evaluated:Patient Re-evaluated prior to induction Oxygen Delivery Method: Circle system utilized Preoxygenation: Pre-oxygenation with 100% oxygen Induction Type: IV induction Ventilation: Mask ventilation without difficulty Laryngoscope Size: Mac and 4 Grade View: Grade I Tube type: Oral Number of attempts: 1 Airway Equipment and Method: Stylet and Oral airway Placement Confirmation: ETT inserted through vocal cords under direct vision, positive ETCO2 and breath sounds checked- equal and bilateral Secured at: 22 cm Tube secured with: Tape Dental Injury: Teeth and Oropharynx as per pre-operative assessment

## 2022-10-24 NOTE — Progress Notes (Signed)
Notified Dr. Daiva Huge of elevated diastolic blood pressures. No new orders given.

## 2022-10-24 NOTE — H&P (Signed)
Synopsis: Referred in November 2023 for pulmonary nodule by No ref. provider found  Subjective:   PATIENT ID: Jonathan Greene GENDER: male DOB: 12/16/1940, MRN: [id number]   This is a 81 year old male, past medical history of A-fib, dyspnea, GERD, hypercholesterolemia, hypertension, prostate cancer, sleep apnea on CPAP.  Patient is a former smoker.He had CT imaging in August 2023 which revealed a enlarging subpleural peripheral left upper lobe pulmonary nodule as well as a nuclear medicine PET scan in September 2023 which revealed a hypermetabolic uptake.  He had a previously treated lesion in the left upper lobe that is stable with no evidence of recurrent metabolic activity no evidence of metastatic disease.  Concern in the left upper lobe subpleural region is that this is a new metachronous lung cancer.  Patient was referred for consideration robotic assisted navigation bronchoscopy tissue sampling.      Past Medical History:  Diagnosis Date   A-fib [facility])    Arthritis    right hand   Bradycardia    COPD (chronic obstructive pulmonary disease) (HCC)    Dyspnea    Dysrhythmia    Frequent urination    GERD (gastroesophageal reflux disease)    Hypercholesterolemia    Hypertension    MI (myocardial infarction) ([place])    Pneumonia    Prostate cancer ([place])    RADIATION TX AND SCHEDULED FOR SEED IMPLANTS 02-28-12   Sleep apnea    uses a cpap   Stroke ([place])    2011;ST memory loss, no other deficits.   Urinary urgency      Family History  Problem Relation Age of Onset   Stroke Mother    Hypertension Mother    Colon cancer Sister        in her 57s   Breast cancer Sister    COPD Sister    Hypertension Sister    Pneumonia Sister    Heart attack Brother    COPD Son    COPD Maternal Aunt    Cancer Neg Hx    Liver disease Neg Hx      Past Surgical History:  Procedure Laterality Date   CATARACT EXTRACTION W/PHACO Right 11/06/2013   Procedure: CATARACT EXTRACTION PHACO AND  INTRAOCULAR LENS PLACEMENT ([place]);  Surgeon: Tonny Branch, MD;  Location: [facility];  Service: Ophthalmology;  Laterality: Right;  CDE 11.67   CATARACT EXTRACTION W/PHACO Left 11/25/2015   Procedure: CATARACT EXTRACTION PHACO AND INTRAOCULAR LENS PLACEMENT ; CDE:  8.15;  Surgeon: Tonny Branch, MD;  Location: [facility];  Service: Ophthalmology;  Laterality: Left;   COLONOSCOPY  10/03/2006   WEX:[id number] rectal polyps cold biopsied/removed, otherwise normal rectum/Sigmoid diverticula.  Remainder of colon mucosa appeared normal   COLONOSCOPY N/A 08/19/2014   RMR: Radiation proctitis-status post APC ablation. Colonic diverticulosis.   CYSTOSCOPY  02/28/2012   Procedure: CYSTOSCOPY;  Surgeon: Bernestine Amass, MD;  Location: [facility];  Service: Urology;  Laterality: N/A;   no seeds noted in bladder   RADIOACTIVE SEED IMPLANT  02/28/2012   Procedure: RADIOACTIVE SEED IMPLANT;  Surgeon: Bernestine Amass, MD;  Location: [facility];  Service: Urology;  Laterality: N/A;  67seeds implanted     Social History   Socioeconomic History   Marital status: Married    Spouse name: Vaughan Basta   Number of children: 2   Years of education: Not on file   Highest education level: Not on file  Occupational History   Occupation: retired  Tobacco Use  Smoking status: Former    Packs/day: 2.00    Years: 35.00    Total pack years: 70.00    Types: Cigarettes    Quit date: 02/21/1991    Years since quitting: 31.6   Smokeless tobacco: Current    Types: Chew   Tobacco comments:    CHEW TOBACCO FOR 20 YRS   Vaping Use   Vaping Use: Never used  Substance and Sexual Activity   Alcohol use: Yes    Alcohol/week: 14.0 standard drinks of alcohol    Types: 14 Shots of liquor per week    Comment: 4 ounces daily   Drug use: No   Sexual activity: Yes    Birth control/protection: None  Other Topics Concern   Not on file  Social History Narrative   Not on file   Social Determinants of Health    Financial Resource Strain: Low Risk  (06/08/2020)   Overall Financial Resource Strain (CARDIA)    Difficulty of Paying Living Expenses: Not hard at all  Food Insecurity: No Food Insecurity (06/08/2020)   Hunger Vital Sign    Worried About Running Out of Food in the Last Year: Never true    Ran Out of Food in the Last Year: Never true  Transportation Needs: No Transportation Needs (06/08/2020)   PRAPARE - Hydrologist (Medical): No    Lack of Transportation (Non-Medical): No  Physical Activity: Not on file  Stress: No Stress Concern Present (06/08/2020)   [place]    Feeling of Stress : Not at all  Social Connections: Moderately Isolated (06/08/2020)   Social Connection and Isolation Panel [NHANES]    Frequency of Communication with Friends and Family: More than three times a week    Frequency of Social Gatherings with Friends and Family: More than three times a week    Attends Religious Services: Never    Marine scientist or Organizations: No    Attends Archivist Meetings: Never    Marital Status: Married  Human resources officer Violence: Not At Risk (06/08/2020)   Humiliation, Afraid, Rape, and Kick questionnaire    Fear of Current or Ex-Partner: No    Emotionally Abused: No    Physically Abused: No    Sexually Abused: No     No Known Allergies   _0 @  ROS   Objective:  Physical Exam   Vitals:   10/24/22 1036  BP: (!) 168/102  Pulse: 81  Resp: 18  Temp: 98.1 F (36.7 C)  TempSrc: Oral  SpO2: 92%  Weight: 106.6 kg  Height: _1  (1.88 m)   92% on RA BMI Readings from Last 3 Encounters:  10/24/22 30.17 kg/m  08/25/22 30.15 kg/m  06/30/22 30.15 kg/m   Wt Readings from Last 3 Encounters:  10/24/22 106.6 kg  08/25/22 106.5 kg  06/30/22 106.5 kg     CBC    Component Value Date/Time   WBC 8.2 10/24/2022 1103   RBC 4.96 10/24/2022 1103   HGB  16.3 10/24/2022 1103   HCT 47.5 10/24/2022 1103   PLT 159 10/24/2022 1103   MCV 95.8 10/24/2022 1103   MCH 32.9 10/24/2022 1103   MCHC 34.3 10/24/2022 1103   RDW 13.2 10/24/2022 1103   LYMPHSABS 1.5 03/24/2022 1447   MONOABS 0.8 03/24/2022 1447   EOSABS 0.2 03/24/2022 1447   BASOSABS 0.1 03/24/2022 1447     Chest Imaging: CT imaging and nuclear medicine  PET imaging revealing a subpleural left upper lobe lung nodule concerning for malignancy. The patient's images have been independently reviewed by me.    Pulmonary Functions Testing Results:    Latest Ref Rng & Units 06/30/2020   10:08 AM 03/19/2015    9:19 AM  PFT Results  FVC-Pre L 4.45  4.87   FVC-Predicted Pre % 91  93   FVC-Post L 4.57  4.92   FVC-Predicted Post % 94  94   Pre FEV1/FVC % % 76  74   Post FEV1/FCV % % 77  74   FEV1-Pre L 3.40  3.63   FEV1-Predicted Pre % 96  94   FEV1-Post L 3.50  3.66   DLCO uncorrected ml/min/mmHg 14.37  20.98   DLCO UNC% % 51  53   DLVA Predicted % 64  52   TLC L 7.09  7.56   TLC % Predicted % 90  94   RV % Predicted % 79  94     FeNO:   Pathology:   Echocardiogram:   Heart Catheterization:     Assessment & Plan:    Left upper lobe lung nodule Former smoker History of lung cancer   Discussion: Today we met and discussed risk-benefit alternatives of proceeding with robotic assisted navigational bronchoscopy. Patient is agreeable to proceed.   Current Facility-Administered Medications:    chlorhexidine (PERIDEX) 0.12 % solution, , , ,    lactated ringers infusion, , Intravenous, Continuous, Howze, Leanord Hawking, MD, Last Rate: 10 mL/hr at 10/24/22 1058, New Bag at 10/24/22 [place], DO [facility] 10/24/2022 11:44 AM

## 2022-10-25 ENCOUNTER — Telehealth: Payer: Self-pay | Admitting: *Deleted

## 2022-10-25 NOTE — Telephone Encounter (Signed)
Called and spoke with patients daughter, Peggye Fothergill [facility]) regarding f/u appointment with Rexene Edison NP following his procedure yesterday.  He is scheduled to see Tammy on 11/29 at 10:30 am, advised to arrive by 10:15 am for check in.  She verbalized understanding.  Nothing further needed.

## 2022-10-26 LAB — CULTURE, BAL-QUANTITATIVE W GRAM STAIN: Culture: NO GROWTH

## 2022-10-27 ENCOUNTER — Encounter (HOSPITAL_COMMUNITY): Payer: Self-pay | Admitting: Pulmonary Disease

## 2022-10-27 LAB — CYTOLOGY - NON PAP

## 2022-10-29 LAB — AEROBIC/ANAEROBIC CULTURE W GRAM STAIN (SURGICAL/DEEP WOUND): Culture: NO GROWTH

## 2022-11-01 ENCOUNTER — Ambulatory Visit: Payer: Medicare Other | Admitting: Adult Health

## 2022-11-01 ENCOUNTER — Encounter: Payer: Self-pay | Admitting: Adult Health

## 2022-11-01 VITALS — BP 130/90 | HR 72 | Temp 97.8°F | Ht 74.0 in | Wt 228.4 lb

## 2022-11-01 DIAGNOSIS — J449 Chronic obstructive pulmonary disease, unspecified: Secondary | ICD-10-CM

## 2022-11-01 DIAGNOSIS — R918 Other nonspecific abnormal finding of lung field: Secondary | ICD-10-CM

## 2022-11-01 DIAGNOSIS — J439 Emphysema, unspecified: Secondary | ICD-10-CM

## 2022-11-01 MED ORDER — AMOXICILLIN-POT CLAVULANATE 875-125 MG PO TABS: 1.0000 | ORAL_TABLET | Freq: Two times a day (BID) | ORAL | 0 refills | Status: DC

## 2022-11-01 NOTE — Progress Notes (Addendum)
@Patient  ID: Jonathan Greene, male    DOB: 1941/01/24, 81 y.o.   MRN: [id number]  Chief Complaint  Patient presents with   Hospitalization Follow-up    Referring provider: Roe Rutherford, NP  HPI: 81 year old male former smoker followed for COPD with emphysema, lung mass, obstructive sleep apnea, history of asbestos exposure History significant for atrial fibrillation and ascending thoracic aortic aneurysm  TEST/EVENTS :  PFT 06/30/20 >> FEV1 3.50 (99%), FEV1% 77, TLC 7.09 (90%), DLCO 51% Lt upper lobe core needle biopsy 07/01/20 >> benign lung with inflammation and fibrosis   Chest Imaging:  CT of the chest without contrast on 06/03/2020 showed 2.3 x 2.2 x 4.4 cm subpleural mass like consolidation in the left upper lobe corresponding to the density seen on the prior radiograph. -Patient smoked for 33 years, quit smoking at age 43.  He smoked on average of 1 pack/day for most years and at the time of quitting, was smoking 3 packs/day of cigarettes. -He worked as a Psychologist, occupational and was Administrator, sports.  He reports asbestos exposure throughout his career. -He was reportedly diagnosed to have asbestos-related lung disease and was seen by an attorney and his doctor in [facility]. -PET scan on 06/16/2020 shows posterior left upper lobe pleural-based hypermetabolic nodule, 2.4 x 2.3 cm SUV 6.2.  AP window lymph node measures 6 mm with SUV 4.3.  No hypermetabolic extrathoracic metastatic disease. -MRI of the brain on 06/22/2020 shows single suspicious focus of enhancement along the inferolateral margin of the temporal lobe on the left measuring 4 mm, not well seen on sagittal or coronal postcontrast imaging.  Unsure if it is small metastasis or a surface vessel.  No second suspicious focus identified.  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 -Left upper lobe needle biopsy on 07/01/2020 shows lung tissue with a lymphoplasmacytic infiltrate and  organizing pneumonia type changes and fibrosis.  IHC negative for tumor. -PET scan on 08/16/2020 shows subpleural left upper lobe lung nodule measuring 2.2 x 1.2 cm, previously 2.8 x 2.2 cm.  SUV 3.4, previously 6.2.  Previously described hypermetabolic AP window node has resolved. - CT chest on 11/18/2020 showed further signs of regression of the left upper lobe mass.   Echo 06/30/20 >> EF 55 to 60%, mod LVH   CT chest 07/18/21 >> ascending aorta 4.3 cm, atherosclerosis, mod emphysema, new LUL patchy consolidation, plaque like lesion LUL CT chest 12/06/21 >> 4.4 cm ascending thoracic aorta, severe emphysema, 2.4 x 0.4 cm mass LUL  11/01/2022 Follow up : Lung Mass , COPD  Patient returns for a 41-month follow-up.  Patient was seen last visit with a COPD exacerbation.  Treated with doxycycline and short course of prednisone.  He had recently had a CT chest done and August 2023 that showed a significantly increased size of the pleural-based nodule in the lateral aspect of the left upper lobe measuring 2.1 x 1.5 cm.  Spiculations were noted in the margin of the lesion.  A subsequent PET scan done on August 18, 2022 showed enlarging subpleural nodule peripherally in the left upper lobe intensely hypermetabolic consistent with probable malignancy.  No evidence of metastatic disease.  Patient's case was reviewed at [facility] , patient was set up for a navigational bronchoscopy that was completed on October 24, 2022.  Cytology was nondiagnostic.  Cultures have been negative to date.  I discussed these details with patient and daughter extensively.  During navigational bronchoscopy several samples were  attempted however patient had associated hemorrhage around the surrounding parenchyma of the lesion.  Discussed case with Dr. Tonia Brooms.  We will hold off on additional tissue sampling at this time.  We discussed several options and will proceed with serial CT in January .  Since procedure he has done well.  Says he had some  initial blood-tinged mucus but that has resolved totally.  Patient says he is breathing okay.  He says he is eating well with good appetite.  No nausea vomiting or diarrhea. Does cough up thick mucus . No fever.    No Known Allergies  Immunization History  Administered Date(s) Administered   Fluad Quad(high Dose 65+) 09/15/2020, 09/16/2021, 09/01/2022   Influenza, High Dose Seasonal PF 08/18/2019   Moderna Sars-Covid-2 Vaccination 11/19/2020   PNEUMOCOCCAL CONJUGATE-20 01/18/2022   Pneumococcal Conjugate-13 08/10/2015   Tdap 03/24/2022   Zoster Recombinat (Shingrix) 10/18/2015    Past Medical History:  Diagnosis Date   A-fib (HCC)    Arthritis    right hand   Bradycardia    COPD (chronic obstructive pulmonary disease) (HCC)    Dyspnea    Dysrhythmia    Frequent urination    GERD (gastroesophageal reflux disease)    Hypercholesterolemia    Hypertension    MI (myocardial infarction) (HCC)    Pneumonia    Prostate cancer (HCC)    RADIATION TX AND SCHEDULED FOR SEED IMPLANTS 02-28-12   Sleep apnea    uses a cpap   Stroke (HCC)    2011;ST memory loss, no other deficits.   Urinary urgency     Tobacco History: Social History   Tobacco Use  Smoking Status Former   Packs/day: 2.00   Years: 35.00   Total pack years: 70.00   Types: Cigarettes   Quit date: 02/21/1991   Years since quitting: 31.7  Smokeless Tobacco Current   Types: Chew  Tobacco Comments   CHEW TOBACCO FOR 20 YRS    Ready to quit: Not Answered Counseling given: Not Answered Tobacco comments: CHEW TOBACCO FOR 20 YRS    Outpatient Medications Prior to Visit  Medication Sig Dispense Refill   acetaminophen (TYLENOL) 500 MG tablet Take 2 tablets (1,000 mg total) by mouth every 6 (six) hours as needed. (Patient taking differently: Take 1,000 mg by mouth every 6 (six) hours as needed for mild pain.) 30 tablet 0   amLODipine (NORVASC) 2.5 MG tablet Take 2.5 mg by mouth daily.     APPLE CIDER VINEGAR PO Take  450 mg by mouth daily.     b complex vitamins capsule Take 1 capsule by mouth daily.     escitalopram (LEXAPRO) 5 MG tablet Take 5 mg by mouth daily.     Glycopyrrolate-Formoterol (BEVESPI AEROSPHERE) 9-4.8 MCG/ACT AERO Inhale 2 puffs into the lungs 2 (two) times daily. 10.7 g 5   metoprolol succinate (TOPROL XL) 25 MG 24 hr tablet Take 1 tablet (25 mg total) by mouth daily. 30 tablet 11   omeprazole (PRILOSEC) 20 MG capsule Take 20 mg by mouth daily.     rivaroxaban (XARELTO) 20 MG TABS tablet Take 1 tablet (20 mg total) by mouth daily with supper. 90 tablet 4   sildenafil (REVATIO) 20 MG tablet May take 3-5 tabs 30 minutes prior to intercourse. (Patient taking differently: Take 60-100 mg by mouth daily as needed (ED). May take 3-5 tabs 30 minutes prior to intercourse.) 30 tablet 6   simvastatin (ZOCOR) 40 MG tablet Take 40 mg by mouth every other  day.     solifenacin (VESICARE) 10 MG tablet Take 10 mg by mouth daily.      tamsulosin (FLOMAX) 0.4 MG CAPS capsule Take 0.4 mg by mouth daily.     Facility-Administered Medications Prior to Visit  Medication Dose Route Frequency Provider Last Rate Last Admin   albuterol (PROVENTIL) (2.5 MG/3ML) 0.083% nebulizer solution 2.5 mg  2.5 mg Nebulization Once Verlie Liotta S, NP         Review of Systems:   Constitutional:   No  weight loss, night sweats,  Fevers, chills,  +fatigue, or  lassitude.  HEENT:   No headaches,  Difficulty swallowing,  Tooth/dental problems, or  Sore throat,                No sneezing, itching, ear ache, nasal congestion, post nasal drip,   CV:  No chest pain,  Orthopnea, PND, swelling in lower extremities, anasarca, dizziness, palpitations, syncope.   GI  No heartburn, indigestion, abdominal pain, nausea, vomiting, diarrhea, change in bowel habits, loss of appetite, bloody stools.   Resp:   No chest wall deformity  Skin: no rash or lesions.  GU: no dysuria, change in color of urine, no urgency or frequency.  No  flank pain, no hematuria   MS:  No joint pain or swelling.  No decreased range of motion.  No back pain.    Physical Exam  BP (!) 130/90 (BP Location: Left Arm, Patient Position: Sitting, Cuff Size: Large)   Pulse 72   Temp 97.8 F (36.6 C) (Oral)   Ht  (1.88 m)   Wt 228 lb 6.4 oz (103.6 kg)   SpO2 98%   BMI 29.32 kg/m   GEN: A/Ox3; pleasant , NAD, well nourished    HEENT:  [place]/AT,   NOSE-clear, THROAT-clear, no lesions, no postnasal drip or exudate noted.   NECK:  Supple w/ fair ROM; no JVD; normal carotid impulses w/o bruits; no thyromegaly or nodules palpated; no lymphadenopathy.    RESP  Clear  P & A; w/o, wheezes/ rales/ or rhonchi. no accessory muscle use, no dullness to percussion  CARD:  RRR, no m/r/g, no peripheral edema, pulses intact, no cyanosis or clubbing.  GI:   Soft & nt; nml bowel sounds; no organomegaly or masses detected.   Musco: Warm bil, no deformities or joint swelling noted.   Neuro: alert, no focal deficits noted.    Skin: Warm, no lesions or rashes    Lab Results:  CBC    Component Value Date/Time   WBC 8.2 10/24/2022 1103   RBC 4.96 10/24/2022 1103   HGB 16.3 10/24/2022 1103   HCT 47.5 10/24/2022 1103   PLT 159 10/24/2022 1103   MCV 95.8 10/24/2022 1103   MCH 32.9 10/24/2022 1103   MCHC 34.3 10/24/2022 1103   RDW 13.2 10/24/2022 1103   LYMPHSABS 1.5 03/24/2022 1447   MONOABS 0.8 03/24/2022 1447   EOSABS 0.2 03/24/2022 1447   BASOSABS 0.1 03/24/2022 1447    BMET    Component Value Date/Time   NA 138 10/24/2022 1103   K 3.9 10/24/2022 1103   CL 103 10/24/2022 1103   CO2 22 10/24/2022 1103   GLUCOSE 113 (H) 10/24/2022 1103   BUN 12 10/24/2022 1103   CREATININE 1.09 10/24/2022 1103   CREATININE 1.34 (H) 11/30/2021 1459   CALCIUM 9.2 10/24/2022 1103   GFRNONAA >60 10/24/2022 1103   GFRAA >60 01/20/2017 1600    BNP No results found for: "BNP"  ProBNP    Component Value Date/Time   PROBNP 166.0 (H) 09/30/2021 1241     Imaging: DG Chest Port 1 View  Result Date: 10/24/2022 CLINICAL DATA:  Post bronchoscopy and biopsy EXAM: PORTABLE CHEST 1 VIEW COMPARISON:  Portable exam 1406 hours compared to 05/31/2022 FINDINGS: Enlargement of cardiac silhouette. Atherosclerotic calcification aorta. New infiltrate in LEFT mid lung, could represent edema or hemorrhage related to bronchoscopy, or infiltrate/pneumonia. Mild atelectasis or infiltrate at RIGHT base. No pleural effusion or pneumothorax. Osseous structures unremarkable. IMPRESSION: Mild atelectasis versus infiltrate RIGHT base. New opacity/infiltrate LEFT mid lung, could represent edema or hemorrhage post bronchoscopy or pneumonia. Electronically Signed   By: Ulyses Southward M.D.   On: 10/24/2022 14:44   DG C-ARM BRONCHOSCOPY  Result Date: 10/24/2022 C-ARM BRONCHOSCOPY: Fluoroscopy was utilized by the requesting physician.  No radiographic interpretation.         Latest Ref Rng & Units 06/30/2020   10:08 AM 03/19/2015    9:19 AM  PFT Results  FVC-Pre L 4.45  4.87   FVC-Predicted Pre % 91  93   FVC-Post L 4.57  4.92   FVC-Predicted Post % 94  94   Pre FEV1/FVC % % 76  74   Post FEV1/FCV % % 77  74   FEV1-Pre L 3.40  3.63   FEV1-Predicted Pre % 96  94   FEV1-Post L 3.50  3.66   DLCO uncorrected ml/min/mmHg 14.37  20.98   DLCO UNC% % 51  53   DLVA Predicted % 64  52   TLC L 7.09  7.56   TLC % Predicted % 90  94   RV % Predicted % 79  94     No results found for: "NITRICOXIDE"      Assessment & Plan:   No problem-specific Assessment & Plan notes found for this encounter.     Rubye Oaks, NP 11/01/2022

## 2022-11-01 NOTE — Patient Instructions (Addendum)
Begin Augmentin 875mg  Twice daily  for 10 days , take with food. Mucinex DM Twice daily  As needed cough/congestion  Continue on BEVESPI 2 puffs Twice daily  Albuterol inhaler or neb As needed  CT chest planned on 12/14/22.  Follow up with Dr. Halford Chessman in 6 weeks and As needed  (after CT chest )  Please contact office for sooner follow up if symptoms do not improve or worsen or seek emergency care    Late Add : spoke to patient  Begin Prednisone 40mg  daily for 5 days , 30mg  daily for 5 days , then 20mg  daily for 5 days then 10mg  daily until gone.

## 2022-11-02 DIAGNOSIS — J449 Chronic obstructive pulmonary disease, unspecified: Secondary | ICD-10-CM | POA: Insufficient documentation

## 2022-11-02 MED ORDER — PREDNISONE 10 MG PO TABS: ORAL_TABLET | ORAL | 0 refills | Status: DC

## 2022-11-02 NOTE — Assessment & Plan Note (Signed)
COPD with emphysema-continue on current regimen

## 2022-11-02 NOTE — Progress Notes (Signed)
Reviewed and agree with assessment/plan.   Chesley Mires, MD [facility] 11/02/2022, 2:25 PM Pager:  [phone]

## 2022-11-02 NOTE — Assessment & Plan Note (Addendum)
Enlarging left upper lobe lung mass PET positive.  Patient underwent navigational bronchoscopy unfortunately cytology was nondiagnostic.  We discussed options in detail.  For now we will hold off on additional tissue sample.  Patient did have some difficulty with localized hemorrhage at the lesion during procedure.   We will treat with empiric antibiotics Augmentin for 10 days,  Patient will follow-up for CT chest in early January and decide next up.  Plan  Patient Instructions  Begin Augmentin 875mg  Twice daily  for 10 days , take with food. Mucinex DM Twice daily  As needed cough/congestion  Continue on BEVESPI 2 puffs Twice daily  Albuterol inhaler or neb As needed  CT chest planned on 12/14/22.  Follow up with Dr. Halford Chessman in 6 weeks and As needed  (after CT chest )  Please contact office for sooner follow up if symptoms do not improve or worsen or seek emergency care      Late add : We will begin slow prednisone taper over the next 3 weeks.  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

## 2022-11-16 ENCOUNTER — Telehealth: Payer: Self-pay | Admitting: Cardiology

## 2022-11-16 NOTE — Telephone Encounter (Signed)
No answer

## 2022-11-16 NOTE — Telephone Encounter (Signed)
Patient is requesting an EP referral for possible ablation with Dr. Quentin Ore. Patient states that he has had afib for several years with no relief. Patient mentions that a friend of his sees Dr. Quentin Ore and is very satisfied. Patient would like to know if he a am appropriate candidate for an ablation/EP workup. Please advise.

## 2022-11-20 NOTE — Telephone Encounter (Signed)
Informed patient that since he has appointment coming up 12/15/2022 - he can discuss this further at that visit.  He verbalized understanding.

## 2022-11-22 LAB — FUNGUS CULTURE WITH STAIN

## 2022-11-22 LAB — FUNGAL ORGANISM REFLEX

## 2022-11-22 LAB — FUNGUS CULTURE RESULT

## 2022-11-30 ENCOUNTER — Ambulatory Visit (HOSPITAL_BASED_OUTPATIENT_CLINIC_OR_DEPARTMENT_OTHER): Payer: Medicare Other | Admitting: Pulmonary Disease

## 2022-12-08 ENCOUNTER — Other Ambulatory Visit (HOSPITAL_COMMUNITY): Payer: Medicare Other

## 2022-12-13 LAB — MTB-RIF NAA W AFB CULT, NON-SPUTUM: Acid Fast Culture: NEGATIVE

## 2022-12-14 ENCOUNTER — Ambulatory Visit (HOSPITAL_COMMUNITY)
Admission: RE | Admit: 2022-12-14 | Discharge: 2022-12-14 | Disposition: A | Discharge status: Home / Self Care | Payer: Medicare Other | Source: Ambulatory Visit | Attending: Pulmonary Disease | Admitting: Pulmonary Disease

## 2022-12-14 DIAGNOSIS — R911 Solitary pulmonary nodule: Secondary | ICD-10-CM | POA: Insufficient documentation

## 2022-12-15 ENCOUNTER — Ambulatory Visit: Payer: Medicare Other | Attending: Cardiology | Admitting: Cardiology

## 2022-12-15 ENCOUNTER — Encounter: Payer: Self-pay | Admitting: *Deleted

## 2022-12-15 ENCOUNTER — Encounter: Payer: Self-pay | Admitting: Cardiology

## 2022-12-15 VITALS — BP 118/78 | HR 74 | Ht 74.0 in | Wt 228.8 lb

## 2022-12-15 DIAGNOSIS — I4819 Other persistent atrial fibrillation: Secondary | ICD-10-CM

## 2022-12-15 DIAGNOSIS — I10 Essential (primary) hypertension: Secondary | ICD-10-CM | POA: Diagnosis not present

## 2022-12-15 DIAGNOSIS — I2510 Atherosclerotic heart disease of native coronary artery without angina pectoris: Secondary | ICD-10-CM | POA: Diagnosis not present

## 2022-12-15 DIAGNOSIS — I7121 Aneurysm of the ascending aorta, without rupture: Secondary | ICD-10-CM | POA: Diagnosis not present

## 2022-12-15 DIAGNOSIS — D6869 Other thrombophilia: Secondary | ICD-10-CM

## 2022-12-15 MED ORDER — TADALAFIL 10 MG PO TABS: 10.0000 mg | ORAL_TABLET | ORAL | 6 refills | Status: DC | PRN

## 2022-12-15 NOTE — Patient Instructions (Signed)
Medication Instructions:  Stop Viagra Begin Cialis 10mg  30 minutes prior to intercourse Continue all other medications.     Labwork: none  Testing/Procedures: none  Follow-Up: 6 months   Any Other Special Instructions Will Be Listed Below (If Applicable).   If you need a refill on your cardiac medications before your next appointment, please call your pharmacy.

## 2022-12-15 NOTE — Progress Notes (Signed)
Clinical Summary Mr. Sem is a 82 y.o.male seen today for follow up of the following medical problems.        1. CAD - noted on prior chest CT - 02/2021 had coronary CTA showed 3 vessel CAD without signs of significant stenosis by FFR - evidence of apical aneurysm suggestiong possible distal vessel infarct.  - no symptoms - he is on xarelto for afib, not on ASA. On statin, beta blocker.     -no chest pains, no SOB. Chronic SOB related to COPD   2. HTN  - compliant with meds - some lightheadness at times, says occurs when SBP <120   3. 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  - upcoming CT chest with pulmonary  - had CT yesterday with pulmonary   4. 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 Hyperlipidemia - on simva 40mg   -09/2022 TC 140 TG 98 HDL 51 LDL 71     6. Afib - denies any palpitations.  - compliant with meds. No bleeding on xarelto - had some questions about possible afib ablation today, had a friend who had done recently and did very well.  Past Medical History:  Diagnosis Date   A-fib [facility])    Arthritis    right hand   Bradycardia    COPD (chronic obstructive pulmonary disease) (HCC)    Dyspnea    Dysrhythmia    Frequent urination    GERD (gastroesophageal reflux disease)    Hypercholesterolemia    Hypertension    MI (myocardial infarction) (HCC)    Pneumonia    Prostate cancer (HCC)    RADIATION TX AND SCHEDULED FOR SEED IMPLANTS 02-28-12   Sleep apnea    uses a cpap   Stroke (HCC)    2011;ST memory loss, no other deficits.   Urinary urgency      No Known Allergies   Current Outpatient Medications  Medication Sig Dispense Refill   acetaminophen (TYLENOL) 500 MG tablet Take 2 tablets (1,000 mg total) by mouth every 6 (six) hours as needed. (Patient taking differently:  Take 1,000 mg by mouth every 6 (six) hours as needed for mild pain.) 30 tablet 0   amLODipine (NORVASC) 2.5 MG tablet Take 2.5 mg by mouth daily.     amoxicillin-clavulanate (AUGMENTIN) 875-125 MG tablet Take 1 tablet by mouth 2 (two) times daily. 20 tablet 0   APPLE CIDER VINEGAR PO Take 450 mg by mouth daily.     b complex vitamins capsule Take 1 capsule by mouth daily.     escitalopram (LEXAPRO) 5 MG tablet Take 5 mg by mouth daily.     Glycopyrrolate-Formoterol (BEVESPI AEROSPHERE) 9-4.8 MCG/ACT AERO Inhale 2 puffs into the lungs 2 (two) times daily. 10.7 g 5   metoprolol succinate (TOPROL XL) 25 MG 24 hr tablet Take 1 tablet (25 mg total) by mouth daily. 30 tablet 11   omeprazole (PRILOSEC) 20 MG capsule Take 20 mg by mouth daily.     predniSONE (DELTASONE) 10 MG tablet 4 tabs daily  for 5 days, then 3 tabs for 5 days, 2 tabs for 5 days, then 1 tab daily for 5 days and stop 50 tablet 0   rivaroxaban (XARELTO) 20 MG TABS tablet Take 1 tablet (20 mg total) by mouth daily with supper. 90  tablet 4   sildenafil (REVATIO) 20 MG tablet May take 3-5 tabs 30 minutes prior to intercourse. (Patient taking differently: Take 60-100 mg by mouth daily as needed (ED). May take 3-5 tabs 30 minutes prior to intercourse.) 30 tablet 6   simvastatin (ZOCOR) 40 MG tablet Take 40 mg by mouth every other day.     solifenacin (VESICARE) 10 MG tablet Take 10 mg by mouth daily.      tamsulosin (FLOMAX) 0.4 MG CAPS capsule Take 0.4 mg by mouth daily.     Current Facility-Administered Medications  Medication Dose Route Frequency Provider Last Rate Last Admin   albuterol (PROVENTIL) (2.5 MG/3ML) 0.083% nebulizer solution 2.5 mg  2.5 mg Nebulization Once Parrett, Virgel Bouquet, NP         Past Surgical History:  Procedure Laterality Date   BRONCHIAL BIOPSY  10/24/2022   Procedure: BRONCHIAL BIOPSIES;  Surgeon: Josephine Igo, DO;  Location: [facility];  Service: Pulmonary;;   BRONCHIAL NEEDLE ASPIRATION BIOPSY   10/24/2022   Procedure: BRONCHIAL NEEDLE ASPIRATION BIOPSIES;  Surgeon: Josephine Igo, DO;  Location: [facility];  Service: Pulmonary;;   BRONCHIAL WASHINGS  10/24/2022   Procedure: BRONCHIAL WASHINGS;  Surgeon: Josephine Igo, DO;  Location: [facility];  Service: Pulmonary;;   CATARACT EXTRACTION W/PHACO Right 11/06/2013   Procedure: CATARACT EXTRACTION PHACO AND INTRAOCULAR LENS PLACEMENT (IOC);  Surgeon: Gemma Payor, MD;  Location: [facility];  Service: Ophthalmology;  Laterality: Right;  CDE 11.67   CATARACT EXTRACTION W/PHACO Left 11/25/2015   Procedure: CATARACT EXTRACTION PHACO AND INTRAOCULAR LENS PLACEMENT ; CDE:  8.15;  Surgeon: Gemma Payor, MD;  Location: [facility];  Service: Ophthalmology;  Laterality: Left;   COLONOSCOPY  10/03/2006   PFY:[id number] rectal polyps cold biopsied/removed, otherwise normal rectum/Sigmoid diverticula.  Remainder of colon mucosa appeared normal   COLONOSCOPY N/A 08/19/2014   RMR: Radiation proctitis-status post APC ablation. Colonic diverticulosis.   CYSTOSCOPY  02/28/2012   Procedure: CYSTOSCOPY;  Surgeon: Valetta Fuller, MD;  Location: [facility];  Service: Urology;  Laterality: N/A;   no seeds noted in bladder   RADIOACTIVE SEED IMPLANT  02/28/2012   Procedure: RADIOACTIVE SEED IMPLANT;  Surgeon: Valetta Fuller, MD;  Location: [facility];  Service: Urology;  Laterality: N/A;  67seeds implanted      No Known Allergies    Family History  Problem Relation Age of Onset   Stroke Mother    Hypertension Mother    Colon cancer Sister        in her 37s   Breast cancer Sister    COPD Sister    Hypertension Sister    Pneumonia Sister    Heart attack Brother    COPD Son    COPD Maternal Aunt    Cancer Neg Hx    Liver disease Neg Hx      Social History Mr. Thorington reports that he quit smoking about 31 years ago. His smoking use included cigarettes. He has a 70.00 pack-year smoking history. His smokeless tobacco use  includes chew. Mr. Nitta reports current alcohol use of about 14.0 standard drinks of alcohol per week.   Review of Systems CONSTITUTIONAL: No weight loss, fever, chills, weakness or fatigue.  HEENT: Eyes: No visual loss, blurred vision, double vision or yellow sclerae.No hearing loss, sneezing, congestion, runny nose or sore throat.  SKIN: No rash or itching.  CARDIOVASCULAR: per hpi RESPIRATORY: No shortness of breath, cough or sputum.  GASTROINTESTINAL: No anorexia,  nausea, vomiting or diarrhea. No abdominal pain or blood.  GENITOURINARY: No burning on urination, no polyuria NEUROLOGICAL: No headache, dizziness, syncope, paralysis, ataxia, numbness or tingling in the extremities. No change in bowel or bladder control.  MUSCULOSKELETAL: No muscle, back pain, joint pain or stiffness.  LYMPHATICS: No enlarged nodes. No history of splenectomy.  PSYCHIATRIC: No history of depression or anxiety.  ENDOCRINOLOGIC: No reports of sweating, cold or heat intolerance. No polyuria or polydipsia.  Marland Kitchen   Physical Examination Today's Vitals   12/15/22 0829  BP: 118/78  Pulse: 74  SpO2: 98%  Weight: 228 lb 12.8 oz (103.8 kg)  Height: 6\' 2"  (1.88 m)   Body mass index is 29.38 kg/m.  Gen: resting comfortably, no acute distress HEENT: no scleral icterus, pupils equal round and reactive, no palptable cervical adenopathy,  CV: irreg, no m/rg, no jvd Resp: Clear to auscultation bilaterally GI: abdomen is soft, non-tender, non-distended, normal bowel sounds, no hepatosplenomegaly MSK: extremities are warm, no edema.  Skin: warm, no rash Neuro:  no focal deficits Psych: appropriate affect   Diagnostic Studies  02/2015 Echo Study Conclusions  - Left ventricle: The cavity size was normal. Wall thickness was   increased in a pattern of mild LVH. Systolic function was normal.   The estimated ejection fraction was in the range of 50% to 55%.   Wall motion was normal; there were no regional wall  motion   abnormalities. Doppler parameters are consistent with abnormal   left ventricular relaxation (grade 1 diastolic dysfunction). - Aortic valve: Mildly calcified annulus. Trileaflet; mildly   thickened leaflets. There was mild regurgitation. Valve area   (VTI): 3.12 cm^2. Valve area (Vmax): 3.15 cm^2. - Aortic root: The visualized portion of the proximal ascending   aorta is mildly dilated at 4.0 cm. - Mitral valve: Mildly calcified annulus. Mildly thickened leaflets   . - Systemic veins: IVC appears small, suggestive of low RA pressures   and hypovolemia. - Technically adequate study.   02/2015 Event monitor Mild sinus brady, no tachyarrhythmias   03/2015 PFTs Decreased DLCO   04/2017 24 hr holter Telemetry tracings show primarily sinus rhythm Min HR 39, Max HR 87, Avg HR 56 Rare ventricular ectopy, primarily in the form of isolated PVCs. One 3 beat run of NSVT. Rare supraventricular ectopy, all in the form of isolated PACs. Some of the PACs are followed by a compensatory pause No symptoms reported   Assessment and Plan  1. CAD - coronary CTA as reported above, 3 vessel disease not significant by FFR - denies any symptoms, continue to monitor   2. Aortic aneurysm - mild, we will continue to monitor with imaging.  Just had chest CT yesterday with pulmonary with pending read, f/u results   3. HTN - at goal, continue curren tmeds   4. Afib/acquried thrombophlia - no symptoms - he was curious about afib ablation. We discussed since he is well controlled without symptoms with just toprol that ablation really would not be of any additional benefit.  - conitnue current meds including xarelto for stroke prevention      05/2017, M.D.

## 2022-12-20 ENCOUNTER — Telehealth: Payer: Self-pay | Admitting: Radiation Oncology

## 2022-12-20 ENCOUNTER — Ambulatory Visit: Payer: Medicare Other | Admitting: Adult Health

## 2022-12-20 ENCOUNTER — Encounter: Payer: Self-pay | Admitting: Adult Health

## 2022-12-20 VITALS — BP 140/80 | HR 83 | Temp 97.7°F | Ht 74.0 in | Wt 229.8 lb

## 2022-12-20 DIAGNOSIS — J441 Chronic obstructive pulmonary disease with (acute) exacerbation: Secondary | ICD-10-CM

## 2022-12-20 DIAGNOSIS — G4733 Obstructive sleep apnea (adult) (pediatric): Secondary | ICD-10-CM

## 2022-12-20 DIAGNOSIS — R911 Solitary pulmonary nodule: Secondary | ICD-10-CM

## 2022-12-20 DIAGNOSIS — R918 Other nonspecific abnormal finding of lung field: Secondary | ICD-10-CM | POA: Diagnosis not present

## 2022-12-20 NOTE — Telephone Encounter (Signed)
Called patient to schedule a consultation w. Dr. Mitzi Hansen. No Answer, LVM for a return call.

## 2022-12-20 NOTE — Progress Notes (Signed)
@Patient  ID: Jonathan Greene, male    DOB: 09-Apr-1941, 82 y.o.   MRN: [id number]  Chief Complaint  Patient presents with   Follow-up    Referring provider: Pablo Lawrence, NP  HPI: 82 year old male former smoker followed for COPD with emphysema, lung mass, obstructive sleep apnea, history of asbestos exposure Medical history significant for A-fib and ascending thoracic aortic aneurysm  TEST/EVENTS :  PFT 06/30/20 >> FEV1 3.50 (99%), FEV1% 77, TLC 7.09 (90%), DLCO 51% Lt upper lobe core needle biopsy 07/01/20 >> benign lung with inflammation and fibrosis   Chest Imaging:  CT of the chest without contrast on 06/03/2020 showed 2.3 x 2.2 x 4.4 cm subpleural mass like consolidation in the left upper lobe corresponding to the density seen on the prior radiograph. -Patient smoked for 33 years, quit smoking at age 82.  He smoked on average of 1 pack/day for most years and at the time of quitting, was smoking 3 packs/day of cigarettes. 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  He reports asbestos exposure throughout his career. 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 -PET scan on 06/16/2020 shows posterior left upper lobe pleural-based hypermetabolic nodule, 2.4 x 2.3 cm SUV 6.2.  AP window lymph node measures 6 mm with SUV 4.3.  No hypermetabolic extrathoracic metastatic disease. -MRI of the brain on 06/22/2020 shows single suspicious focus of enhancement along the inferolateral margin of the temporal lobe on the left measuring 4 mm, not well seen on sagittal or coronal postcontrast imaging.  Unsure if it is small metastasis or a surface vessel.  No second suspicious focus identified.  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 -Left upper lobe needle biopsy on 07/01/2020 shows lung tissue with a lymphoplasmacytic infiltrate and organizing pneumonia type  changes and fibrosis.  IHC negative for tumor. -PET scan on 08/16/2020 shows subpleural left upper lobe lung nodule measuring 2.2 x 1.2 cm, previously 2.8 x 2.2 cm.  SUV 3.4, previously 6.2.  Previously described hypermetabolic AP window node has resolved. - CT chest on 11/18/2020 showed further signs of regression of the left upper lobe mass.   Echo 06/30/20 >> EF 55 to 60%, mod LVH   CT chest 07/18/21 >> ascending aorta 4.3 cm, atherosclerosis, mod emphysema, new LUL patchy consolidation, plaque like lesion LUL CT chest 12/06/21 >> 4.4 cm ascending thoracic aorta, severe emphysema, 2.4 x 0.4 cm mass LUL  12/20/2022 Follow up ; Lung Mass and COPD  Patient presents for a 34-month follow-up.  Patient is being followed for a left upper lobe lung mass.  CT chest August 2023 showed a significantly decreased size of the pleural-based nodule in the lateral aspect of the left upper lobe measuring 2.1 x 1.5 cm.  Patient subsequent PET scan on August 18, 2022 showed enlarging subpleural nodule peripherally in the left upper lobe intensely hypermetabolic consistent with probable malignancy.  No evidence of metastatic disease.  Patient was set up for navigational bronchoscopy that was done on October 24, 2022.  Cytology was nondiagnostic.  Cultures were negative.  Unfortunately during navigational bronchoscopy several samples were attempted however had associated hemorrhage around the surrounding parenchyma of the lesion.  Last visit patient was treated with course of Augmentin and a slow prednisone taper.  Patient says he felt good while taking.  Says overall he is doing good he remains very active.  He still hunts and fishes.   Patient was set up for a serial CT chest that was completed on December 14, 2022 that showed stable ascending thoracic aorta measuring at 4.4 cm.,  Severe emphysema, increased spiculated subpleural solid nodule in the left upper lobe measuring 2.4 x 1.4 cm previously at 1.7 x 1.5 cm. Patient  has underlying sleep apnea.  Is on CPAP at bedtime.  Patient says he wears a CPAP every single night.  CPAP download shows excellent compliance with 96% usage.  Daily average usage at 7 hours.  AHI is 2.4/hour.  No Known Allergies  Immunization History  Administered Date(s) Administered   Fluad Quad(high Dose 65+) 09/15/2020, 09/16/2021, 09/01/2022   Influenza, High Dose Seasonal PF 08/18/2019   Moderna Sars-Covid-2 Vaccination 11/19/2020   PNEUMOCOCCAL CONJUGATE-20 01/18/2022   Pneumococcal Conjugate-13 08/10/2015   Tdap 03/24/2022   Zoster Recombinat (Shingrix) 10/18/2015    Past Medical History:  Diagnosis Date   A-fib (HCC)    Arthritis    right hand   Bradycardia    COPD (chronic obstructive pulmonary disease) (HCC)    Dyspnea    Dysrhythmia    Frequent urination    GERD (gastroesophageal reflux disease)    Hypercholesterolemia    Hypertension    MI (myocardial infarction) (HCC)    Pneumonia    Prostate cancer (HCC)    RADIATION TX AND SCHEDULED FOR SEED IMPLANTS 02-28-12   Sleep apnea    uses a cpap   Stroke (HCC)    2011;ST memory loss, no other deficits.   Urinary urgency     Tobacco History: Social History   Tobacco Use  Smoking Status Former   Packs/day: 2.00   Years: 35.00   Total pack years: 70.00   Types: Cigarettes   Quit date: 02/21/1991   Years since quitting: 31.8  Smokeless Tobacco Current   Types: Chew  Tobacco Comments   CHEW TOBACCO FOR 20 YRS    Ready to quit: Not Answered Counseling given: Not Answered Tobacco comments: CHEW TOBACCO FOR 20 YRS    Outpatient Medications Prior to Visit  Medication Sig Dispense Refill   acetaminophen (TYLENOL) 500 MG tablet Take 2 tablets (1,000 mg total) by mouth every 6 (six) hours as needed. (Patient taking differently: Take 1,000 mg by mouth every 6 (six) hours as needed for mild pain.) 30 tablet 0   amLODipine (NORVASC) 2.5 MG tablet Take 2.5 mg by mouth daily.     APPLE CIDER VINEGAR PO Take 450  mg by mouth daily.     b complex vitamins capsule Take 1 capsule by mouth daily.     escitalopram (LEXAPRO) 5 MG tablet Take 5 mg by mouth daily.     Glycopyrrolate-Formoterol (BEVESPI AEROSPHERE) 9-4.8 MCG/ACT AERO Inhale 2 puffs into the lungs 2 (two) times daily. 10.7 g 5   metoprolol succinate (TOPROL XL) 25 MG 24 hr tablet Take 1 tablet (25 mg total) by mouth daily. 30 tablet 11   omeprazole (PRILOSEC) 20 MG capsule Take 20 mg by mouth daily.     rivaroxaban (XARELTO) 20 MG TABS tablet Take 1 tablet (20 mg total) by mouth daily with supper. 90 tablet 4   simvastatin (ZOCOR) 40 MG tablet Take 40 mg by mouth every other day.     solifenacin (VESICARE) 10 MG tablet Take 10 mg by mouth daily.      tadalafil (CIALIS) 10 MG tablet Take 1 tablet (10 mg total) by mouth as needed for erectile dysfunction (30  minutes prior to intercourse). 4 tablet 6   tamsulosin (FLOMAX) 0.4 MG CAPS capsule Take 0.4 mg by mouth daily.     Facility-Administered Medications Prior to Visit  Medication Dose Route Frequency Provider Last Rate Last Admin   albuterol (PROVENTIL) (2.5 MG/3ML) 0.083% nebulizer solution 2.5 mg  2.5 mg Nebulization Once Gabrial Poppell S, NP         Review of Systems:   Constitutional:   No  weight loss, night sweats,  Fevers, chills, fatigue, or  lassitude.  HEENT:   No headaches,  Difficulty swallowing,  Tooth/dental problems, or  Sore throat,                No sneezing, itching, ear ache, nasal congestion, post nasal drip,   CV:  No chest pain,  Orthopnea, PND, swelling in lower extremities, anasarca, dizziness, palpitations, syncope.   GI  No heartburn, indigestion, abdominal pain, nausea, vomiting, diarrhea, change in bowel habits, loss of appetite, bloody stools.   Resp:   No excess mucus, no productive cough,  No non-productive cough,  No coughing up of blood.  No change in color of mucus.  No wheezing.  No chest wall deformity  Skin: no rash or lesions.  GU: no dysuria,  change in color of urine, no urgency or frequency.  No flank pain, no hematuria   MS:  No joint pain or swelling.  No decreased range of motion.  No back pain.    Physical Exam  BP (!) 140/80 (BP Location: Left Arm, Patient Position: Sitting, Cuff Size: Normal)   Pulse 83   Temp 97.7 F (36.5 C) (Oral)   Ht 6\' 2"  (1.88 m)   Wt 229 lb 12.8 oz (104.2 kg)   SpO2 97%   BMI 29.50 kg/m   GEN: A/Ox3; pleasant , NAD, well nourished    HEENT:  [place]/AT,  EACs-clear, TMs-wnl, NOSE-clear, THROAT-clear, no lesions, no postnasal drip or exudate noted.   NECK:  Supple w/ fair ROM; no JVD; normal carotid impulses w/o bruits; no thyromegaly or nodules palpated; no lymphadenopathy.    RESP  Clear  P & A; w/o, wheezes/ rales/ or rhonchi. no accessory muscle use, no dullness to percussion  CARD:  RRR, no m/r/g, no peripheral edema, pulses intact, no cyanosis or clubbing.  GI:   Soft & nt; nml bowel sounds; no organomegaly or masses detected.   Musco: Warm bil, no deformities or joint swelling noted.   Neuro: alert, no focal deficits noted.    Skin: Warm, no lesions or rashes    Lab Results:  CBC   Imaging: CT Chest Wo Contrast  Result Date: 12/15/2022 CLINICAL DATA:  Pulmonary nodule follow-up EXAM: CT CHEST WITHOUT CONTRAST TECHNIQUE: Multidetector CT imaging of the chest was performed following the standard protocol without IV contrast. RADIATION DOSE REDUCTION: This exam was performed according to the departmental dose-optimization program which includes automated exposure control, adjustment of the mA and/or kV according to patient size and/or use of iterative reconstruction technique. COMPARISON:  Multiple priors, most recent PET-CT dated August 18, 2022 FINDINGS: Cardiovascular: Normal heart size. No pericardial effusion. Dilated ascending thoracic aorta, measuring up to 4.4 cm, unchanged when compared with the prior. Moderate atherosclerotic disease of the thoracic aorta. Severe  left main and three-vessel coronary artery calcifications. Mediastinum/Nodes: Small hiatal hernia. Thyroid is unremarkable. No pathologically enlarged lymph nodes seen in the chest. Lungs/Pleura: Central airways are patent. Severe centrilobular emphysema. Spiculated subpleural solid pulmonary nodule of the left upper lobe  measuring 2.4 by 1.4 cm on series 4, image 61 with new biopsy marker clip, previously 1.7 x 1.5 cm. Stable small solid pulmonary nodule of the right upper lobe measuring 4 mm on series 4, image 57. Stable posttreatment changes of the posterior left upper lobe. No consolidation, pleural effusion or pneumothorax. Upper Abdomen: No acute abnormality. Musculoskeletal: No chest wall mass or suspicious bone lesions identified. IMPRESSION: 1. Spiculated subpleural solid pulmonary nodule of the left upper lobe is increased in size when compared with most recent prior and highly concerning for primary lung malignancy. 2. Stable posttreatment changes of the posterior left upper lobe. 3. Stable small solid pulmonary nodule of the right upper lobe. Recommend attention on follow-up. 4. 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 Recommend annual imaging followup by CTA or MRA. This recommendation follows 2010 ACCF/AHA/AATS/ACR/ASA/SCA/SCAI/SIR/STS/SVM Guidelines for the Diagnosis and Management of Patients with Thoracic Aortic Disease. Circulation. 2010; 121: V697-X480. Aortic aneurysm NOS (ICD10-I71.9) 5. Coronary artery calcifications, aortic Atherosclerosis (ICD10-I70.0) and Emphysema (ICD10-J43.9). Electronically Signed   By: Allegra Lai M.D.   On: 12/15/2022 12:55         Latest Ref Rng & Units 06/30/2020   10:08 AM 03/19/2015    9:19 AM  PFT Results  FVC-Pre L 4.45  4.87   FVC-Predicted Pre % 91  93   FVC-Post L 4.57  4.92   FVC-Predicted Post % 94  94   Pre FEV1/FVC % % 76  74   Post FEV1/FCV % % 77  74   FEV1-Pre L 3.40  3.63    FEV1-Predicted Pre % 96  94   FEV1-Post L 3.50  3.66   DLCO uncorrected ml/min/mmHg 14.37  20.98   DLCO UNC% % 51  53   DLVA Predicted % 64  52   TLC L 7.09  7.56   TLC % Predicted % 90  94   RV % Predicted % 79  94     No results found for: "NITRICOXIDE"      Assessment & Plan:   No problem-specific Assessment & Plan notes found for this encounter.     Rubye Oaks, NP 12/20/2022

## 2022-12-20 NOTE — Patient Instructions (Addendum)
Refer to Radiation Oncology -Hypermetabolic left Lung mass  Mucinex DM Twice daily  As needed cough/congestion  Continue on BEVESPI 2 puffs Twice daily  Albuterol inhaler or neb As needed  CT chest in 3 months (follow lung mass) -Jeani Hawking  Continue on CPAP At bedtime   CPAP download  Follow up with Dr. Craige Cotta in 3 months and As needed   Please contact office for sooner follow up if symptoms do not improve or worsen or seek emergency care

## 2022-12-21 ENCOUNTER — Telehealth: Payer: Self-pay | Admitting: Radiation Oncology

## 2022-12-21 DIAGNOSIS — G4733 Obstructive sleep apnea (adult) (pediatric): Secondary | ICD-10-CM | POA: Insufficient documentation

## 2022-12-21 NOTE — Assessment & Plan Note (Signed)
Recent exacerbation now resolved.  Patient is back to baseline.  Continue on current regimen  Plan  . Patient Instructions  Refer to Radiation Oncology -Hypermetabolic left Lung mass  Mucinex DM Twice daily  As needed cough/congestion  Continue on BEVESPI 2 puffs Twice daily  Albuterol inhaler or neb As needed  CT chest in 3 months (follow lung mass) -Jeani Hawking  Continue on CPAP At bedtime   CPAP download  Follow up with Dr. Craige Cotta in 3 months and As needed   Please contact office for sooner follow up if symptoms do not improve or worsen or seek emergency care

## 2022-12-21 NOTE — Assessment & Plan Note (Signed)
Long discussion with patient and daughter.  We reviewed his most recent CT chest completed on December 14, 2022 that shows a slightly increased size of spiculated subpleural solid nodule in the left upper lobe measuring 2.4 x 1.4 cm previously at 2.1 x 1.5 cm.  PET scan August 18, 2022 showed hypermetabolic activity.  This is concerning for underlying malignancy.  Unfortunately navigational bronchoscopy-cytology was nondiagnostic.  Cultures were negative.  He has been treated with course of antibiotics and steroids without any significant improvement.  Long discussion regarding serial follow-up versus possible radiation.  Patient is not felt to be a surgical candidate.  CT-guided biopsy is not recommended. For now will refer to radiation oncology , we will also set patient up for a CT chest in 3 months. Case was discussed with Dr. Craige Cotta as well

## 2022-12-21 NOTE — Progress Notes (Signed)
Thoracic Location of Tumor / Histology: Left Upper Lobe  Patient has been followed by pulmonary for COPD, emphysema, lung mass, obstructive sleep apnea, and asbestos exposure history.    CT Chest 03/21/2023:  CT Chest 12/14/2022: Spiculated subpleural solid pulmonary nodule of the left upper lobe is increased in size when compared with most recent prior and highly concerning for primary lung malignancy.  Stable posttreatment changes of the posterior left upper lobe.  Stable small solid pulmonary nodule of the right upper lobe.  Recommend attention on follow-up.  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  The previously treated lesion more posteriorly in the left upper lobe is stable, without recurrent metabolic activity. No evidence of metastatic disease.  CT Chest 07/31/2022: There is 2.1 x 1.5 cm spiculated pleural-based nodule in left upper lobe. This may suggest malignant neoplasm.  Biopsies of LUL Lung 10/24/2022   Tobacco/Marijuana/Snuff/ETOH use: Former smoker, quit at age 20.  He smoked for 33 years.   Past/Anticipated interventions by cardiothoracic surgery, if any:    Past/Anticipated interventions by pulmonary, if any: Tammy Parrett NP 12/20/2022 -  11/01/2022 -During navigational bronchoscopy several samples were attempted however patient had associated hemorrhage around the surrounding parenchyma of the lesion.  Discussed case with Dr. Tonia Brooms.  We will hold off on additional tissue sampling at this time.  -We discussed several options and will proceed with serial CT in January.    Past/Anticipated interventions by medical oncology, if any:     Signs/Symptoms Weight changes, if any: He has lost a couple of pounds over the past month. Respiratory complaints, if any: He notes SOB with increased activities. Hemoptysis, if any:  He reports productive cough on occasion with clear/yellow mucus.  Denies hemoptysis. Pain issues, if any: No   SAFETY ISSUES: Prior radiation? 2/5-3/10/2012, Prostate/Seminal Vesicles 4500 cGy 25 fractions.  External beam 11,000 cGy prostate seed implant boost, with Dr. Dayton Scrape  Pacemaker/ICD? No  Possible current pregnancy? N/a Is the patient on methotrexate? No   Current Complaints / other details:

## 2022-12-21 NOTE — Progress Notes (Signed)
Reviewed and agree with assessment/plan.   Coralyn Helling, MD [facility] Pulmonary/Critical Care 12/21/2022, 5:54 PM Pager:  [phone]

## 2022-12-21 NOTE — Assessment & Plan Note (Signed)
Continue on CPAP at bedtime.  CPAP download shows excellent compliance and control.

## 2022-12-21 NOTE — Telephone Encounter (Signed)
Called patient to schedule a consultation w. Dr. Mitzi Hansen. No answer, LVM for a return call.

## 2022-12-22 ENCOUNTER — Ambulatory Visit
Admission: RE | Admit: 2022-12-22 | Discharge: 2022-12-22 | Disposition: A | Discharge status: Home / Self Care | Payer: Medicare Other | Source: Ambulatory Visit | Attending: Radiation Oncology | Admitting: Radiation Oncology

## 2022-12-22 ENCOUNTER — Encounter: Payer: Self-pay | Admitting: Radiation Oncology

## 2022-12-22 ENCOUNTER — Other Ambulatory Visit: Payer: Self-pay

## 2022-12-22 VITALS — BP 138/96 | HR 76 | Temp 97.0°F | Resp 18 | Ht 74.0 in | Wt 229.0 lb

## 2022-12-22 DIAGNOSIS — Z8546 Personal history of malignant neoplasm of prostate: Secondary | ICD-10-CM | POA: Diagnosis not present

## 2022-12-22 DIAGNOSIS — C3412 Malignant neoplasm of upper lobe, left bronchus or lung: Secondary | ICD-10-CM | POA: Diagnosis present

## 2022-12-22 DIAGNOSIS — Z800 Family history of malignant neoplasm of digestive organs: Secondary | ICD-10-CM | POA: Insufficient documentation

## 2022-12-22 DIAGNOSIS — R911 Solitary pulmonary nodule: Secondary | ICD-10-CM | POA: Insufficient documentation

## 2022-12-22 DIAGNOSIS — J449 Chronic obstructive pulmonary disease, unspecified: Secondary | ICD-10-CM | POA: Diagnosis not present

## 2022-12-22 DIAGNOSIS — Z87891 Personal history of nicotine dependence: Secondary | ICD-10-CM | POA: Diagnosis not present

## 2022-12-22 DIAGNOSIS — I4891 Unspecified atrial fibrillation: Secondary | ICD-10-CM | POA: Insufficient documentation

## 2022-12-22 DIAGNOSIS — G4730 Sleep apnea, unspecified: Secondary | ICD-10-CM | POA: Insufficient documentation

## 2022-12-22 DIAGNOSIS — K219 Gastro-esophageal reflux disease without esophagitis: Secondary | ICD-10-CM | POA: Insufficient documentation

## 2022-12-22 DIAGNOSIS — K449 Diaphragmatic hernia without obstruction or gangrene: Secondary | ICD-10-CM | POA: Diagnosis not present

## 2022-12-22 DIAGNOSIS — E7800 Pure hypercholesterolemia, unspecified: Secondary | ICD-10-CM | POA: Diagnosis not present

## 2022-12-22 DIAGNOSIS — I10 Essential (primary) hypertension: Secondary | ICD-10-CM | POA: Diagnosis not present

## 2022-12-22 DIAGNOSIS — J432 Centrilobular emphysema: Secondary | ICD-10-CM | POA: Insufficient documentation

## 2022-12-22 DIAGNOSIS — Z8673 Personal history of transient ischemic attack (TIA), and cerebral infarction without residual deficits: Secondary | ICD-10-CM | POA: Insufficient documentation

## 2022-12-22 DIAGNOSIS — Z7901 Long term (current) use of anticoagulants: Secondary | ICD-10-CM | POA: Insufficient documentation

## 2022-12-22 DIAGNOSIS — I2510 Atherosclerotic heart disease of native coronary artery without angina pectoris: Secondary | ICD-10-CM | POA: Insufficient documentation

## 2022-12-22 DIAGNOSIS — Z79899 Other long term (current) drug therapy: Secondary | ICD-10-CM | POA: Diagnosis not present

## 2022-12-22 DIAGNOSIS — Z803 Family history of malignant neoplasm of breast: Secondary | ICD-10-CM | POA: Insufficient documentation

## 2022-12-22 DIAGNOSIS — I252 Old myocardial infarction: Secondary | ICD-10-CM | POA: Diagnosis not present

## 2022-12-22 NOTE — Progress Notes (Signed)
Radiation Oncology         [phone] ________________________________  Name: Jonathan Greene        MRN: [id number]  Date of Service: 12/22/2022 DOB: 03/13/1941  HE:KBTCYE, Toni Amend, NP  Josephine Igo, DO     REFERRING PHYSICIAN: Josephine Igo, DO   DIAGNOSIS: The encounter diagnosis was Lung nodule.   HISTORY OF PRESENT ILLNESS: Jonathan Greene is a 82 y.o. male with a past medical history of A-fib, dyspnea, GERD, hypercholesterolemia, hypertension, prostate cancer, sleep apnea on CPAP seen at the request of Dr. Tonia Brooms. Patient is a former smoker. Chest CT on 07/31/22 showed a 2.1 x 1.5 cm spiculated pleural-based nodule in the left upper lobe. Follow-up PET-CT and tissues sampling was recommended. No significant lymphadenopathy was seen. PET on 08/18/22 showed a hypermetabolic enlarging subpleural nodule peripherally in the left upper lobe and a previously treated lesion in the posterior left upper lobe without recurrent metabolic activity. No evidence of metastatic disease was seen. Patient was reviewed at the multidisciplinary oncology conference and was scheduled for navigational bronchoscopy with Dr. Tonia Brooms on 10/24/22. Cytology was nondiagnostic and cultures were negative. Unfortunately during navigational bronchoscopy several samples were attempted however patient had associated hemorrhage around the surrounding parenchyma of the lesion. Due to patient being high risk for another biopsy a follow-up CT was recommended to reassess nodule. Chest CT on 12/14/22 showed left upper lobe nodule increasing in size along with a stable small solid pulmonary nodule in the right upper lobe. Stable posttreatment changes of the posterior left upper lobe were also visualized. Of note, a dilated ascending thoracic was visualized measuring 4.4 cm, was visualized and remained unchanged when compared to prior imaging.   He was referred to [place] today to discuss radiation treatment options to the enlarging  pulmonary nodule.    PREVIOUS RADIATION THERAPY: Yes   EBRT and brachytherapy to the prostate in 2013 with Dr. Dayton Scrape.    PAST MEDICAL HISTORY:  Past Medical History:  Diagnosis Date   A-fib [facility])    Arthritis    right hand   Bradycardia    COPD (chronic obstructive pulmonary disease) (HCC)    Dyspnea    Dysrhythmia    Frequent urination    GERD (gastroesophageal reflux disease)    Hypercholesterolemia    Hypertension    MI (myocardial infarction) (HCC)    Pneumonia    Prostate cancer (HCC)    RADIATION TX AND SCHEDULED FOR SEED IMPLANTS 02-28-12   Sleep apnea    uses a cpap   Stroke (HCC)    2011;ST memory loss, no other deficits.   Urinary urgency        PAST SURGICAL HISTORY: Past Surgical History:  Procedure Laterality Date   BRONCHIAL BIOPSY  10/24/2022   Procedure: BRONCHIAL BIOPSIES;  Surgeon: Josephine Igo, DO;  Location: [facility];  Service: Pulmonary;;   BRONCHIAL NEEDLE ASPIRATION BIOPSY  10/24/2022   Procedure: BRONCHIAL NEEDLE ASPIRATION BIOPSIES;  Surgeon: Josephine Igo, DO;  Location: [facility];  Service: Pulmonary;;   BRONCHIAL WASHINGS  10/24/2022   Procedure: BRONCHIAL WASHINGS;  Surgeon: Josephine Igo, DO;  Location: [facility];  Service: Pulmonary;;   CATARACT EXTRACTION W/PHACO Right 11/06/2013   Procedure: CATARACT EXTRACTION PHACO AND INTRAOCULAR LENS PLACEMENT (IOC);  Surgeon: Gemma Payor, MD;  Location: [facility];  Service: Ophthalmology;  Laterality: Right;  CDE 11.67   CATARACT EXTRACTION W/PHACO Left 11/25/2015   Procedure: CATARACT EXTRACTION PHACO AND INTRAOCULAR LENS PLACEMENT ;  CDE:  8.15;  Surgeon: Gemma Payor, MD;  Location: [facility];  Service: Ophthalmology;  Laterality: Left;   COLONOSCOPY  10/03/2006   QBD:HURTIKKENI rectal polyps cold biopsied/removed, otherwise normal rectum/Sigmoid diverticula.  Remainder of colon mucosa appeared normal   COLONOSCOPY N/A 08/19/2014   RMR: Radiation proctitis-status post APC ablation.  Colonic diverticulosis.   CYSTOSCOPY  02/28/2012   Procedure: CYSTOSCOPY;  Surgeon: Valetta Fuller, MD;  Location: [facility];  Service: Urology;  Laterality: N/A;   no seeds noted in bladder   RADIOACTIVE SEED IMPLANT  02/28/2012   Procedure: RADIOACTIVE SEED IMPLANT;  Surgeon: Valetta Fuller, MD;  Location: [facility];  Service: Urology;  Laterality: N/A;  67seeds implanted      FAMILY HISTORY:  Family History  Problem Relation Age of Onset   Stroke Mother    Hypertension Mother    Colon cancer Sister        in her 63s   Breast cancer Sister    COPD Sister    Hypertension Sister    Pneumonia Sister    Heart attack Brother    COPD Son    COPD Maternal Aunt    Cancer Neg Hx    Liver disease Neg Hx      SOCIAL HISTORY:  reports that he quit smoking about 31 years ago. His smoking use included cigarettes. He has a 70.00 pack-year smoking history. His smokeless tobacco use includes chew. He reports current alcohol use of about 14.0 standard drinks of alcohol per week. He reports that he does not use drugs.   ALLERGIES: Patient has no known allergies.   MEDICATIONS:  Current Outpatient Medications  Medication Sig Dispense Refill   acetaminophen (TYLENOL) 500 MG tablet Take 2 tablets (1,000 mg total) by mouth every 6 (six) hours as needed. (Patient taking differently: Take 1,000 mg by mouth every 6 (six) hours as needed for mild pain.) 30 tablet 0   amLODipine (NORVASC) 2.5 MG tablet Take 2.5 mg by mouth daily.     APPLE CIDER VINEGAR PO Take 450 mg by mouth daily.     b complex vitamins capsule Take 1 capsule by mouth daily.     escitalopram (LEXAPRO) 5 MG tablet Take 5 mg by mouth daily.     Glycopyrrolate-Formoterol (BEVESPI AEROSPHERE) 9-4.8 MCG/ACT AERO Inhale 2 puffs into the lungs 2 (two) times daily. 10.7 g 5   metoprolol succinate (TOPROL XL) 25 MG 24 hr tablet Take 1 tablet (25 mg total) by mouth daily. 30 tablet 11   omeprazole (PRILOSEC)  20 MG capsule Take 20 mg by mouth daily.     rivaroxaban (XARELTO) 20 MG TABS tablet Take 1 tablet (20 mg total) by mouth daily with supper. 90 tablet 4   simvastatin (ZOCOR) 40 MG tablet Take 40 mg by mouth every other day.     solifenacin (VESICARE) 10 MG tablet Take 10 mg by mouth daily.      tadalafil (CIALIS) 10 MG tablet Take 1 tablet (10 mg total) by mouth as needed for erectile dysfunction (30 minutes prior to intercourse). 4 tablet 6   tamsulosin (FLOMAX) 0.4 MG CAPS capsule Take 0.4 mg by mouth daily.     Current Facility-Administered Medications  Medication Dose Route Frequency Provider Last Rate Last Admin   albuterol (PROVENTIL) (2.5 MG/3ML) 0.083% nebulizer solution 2.5 mg  2.5 mg Nebulization Once Parrett, Tammy S, NP         REVIEW OF SYSTEMS: On review of  systems, the patient reports that he is doing well overall. He endorses baseline shortness of breath with exertion and a productive cough with phlegm. He denies hemoptysis, chest pain, fevers, or unexpected weight loss. He is not on oxygen.   PHYSICAL EXAM:  Wt Readings from Last 3 Encounters:  12/22/22 229 lb (103.9 kg)  12/20/22 229 lb 12.8 oz (104.2 kg)  12/15/22 228 lb 12.8 oz (103.8 kg)   Temp Readings from Last 3 Encounters:  12/22/22 (!) 97 F (36.1 C) (Temporal)  12/20/22 97.7 F (36.5 C) (Oral)  11/01/22 97.8 F (36.6 C) (Oral)   BP Readings from Last 3 Encounters:  12/22/22 (!) 138/96  12/20/22 (!) 140/80  12/15/22 118/78   Pulse Readings from Last 3 Encounters:  12/22/22 76  12/20/22 83  12/15/22 74   Pain Assessment Pain Score: 0-No pain/10  In general this is a well appearing male in no acute distress. He's alert and oriented x4 and appropriate throughout the examination. Cardiopulmonary assessment is negative for acute distress and he exhibits normal effort.     ECOG = 1  0 - Asymptomatic (Fully active, able to carry on all predisease activities without restriction)  1 - Symptomatic  but completely ambulatory (Restricted in physically strenuous activity but ambulatory and able to carry out work of a light or sedentary nature. For example, light housework, office work)  2 - Symptomatic, <50% in bed during the day (Ambulatory and capable of all self care but unable to carry out any work activities. Up and about more than 50% of waking hours)  3 - Symptomatic, >50% in bed, but not bedbound (Capable of only limited self-care, confined to bed or chair 50% or more of waking hours)  4 - Bedbound (Completely disabled. Cannot carry on any self-care. Totally confined to bed or chair)  5 - Death   Santiago Glad MM, Creech RH, Tormey DC, et al. [phone]). "Toxicity and response criteria of the [facility]". Am. Evlyn Clines. Oncol. 5 (6): 649-55    LABORATORY DATA:  Lab Results  Component Value Date   WBC 8.2 10/24/2022   HGB 16.3 10/24/2022   HCT 47.5 10/24/2022   MCV 95.8 10/24/2022   PLT 159 10/24/2022   Lab Results  Component Value Date   NA 138 10/24/2022   K 3.9 10/24/2022   CL 103 10/24/2022   CO2 22 10/24/2022   Lab Results  Component Value Date   ALT 62 (H) 03/08/2022   AST 76 (H) 03/08/2022   ALKPHOS 67 07/18/2021   BILITOT 1.2 03/08/2022      RADIOGRAPHY: CT Chest Wo Contrast  Result Date: 12/15/2022 CLINICAL DATA:  Pulmonary nodule follow-up EXAM: CT CHEST WITHOUT CONTRAST TECHNIQUE: Multidetector CT imaging of the chest was performed following the standard protocol without IV contrast. RADIATION DOSE REDUCTION: This exam was performed according to the departmental dose-optimization program which includes automated exposure control, adjustment of the mA and/or kV according to patient size and/or use of iterative reconstruction technique. COMPARISON:  Multiple priors, most recent PET-CT dated August 18, 2022 FINDINGS: Cardiovascular: Normal heart size. No pericardial effusion. 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 Moderate atherosclerotic disease of the thoracic aorta. Severe left main and three-vessel coronary artery calcifications. Mediastinum/Nodes: Small hiatal hernia. Thyroid is unremarkable. No pathologically enlarged lymph nodes seen in the chest. Lungs/Pleura: Central airways are patent. Severe centrilobular emphysema. Spiculated subpleural solid pulmonary nodule of the left upper lobe measuring  2.4 by 1.4 cm on series 4, image 61 with new biopsy marker clip, previously 1.7 x 1.5 cm. Stable small solid pulmonary nodule of the right upper lobe measuring 4 mm on series 4, image 57. Stable posttreatment changes of the posterior left upper lobe. No consolidation, pleural effusion or pneumothorax. Upper Abdomen: No acute abnormality. Musculoskeletal: No chest wall mass or suspicious bone lesions identified. IMPRESSION: 1. Spiculated subpleural solid pulmonary nodule of the left upper lobe is increased in size when compared with most recent prior and highly concerning for primary lung malignancy. 2. Stable posttreatment changes of the posterior left upper lobe. 3. Stable small solid pulmonary nodule of the right upper lobe. Recommend attention on follow-up. 4. 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 Recommend annual imaging followup by CTA or MRA. This recommendation follows 2010 ACCF/AHA/AATS/ACR/ASA/SCA/SCAI/SIR/STS/SVM Guidelines for the Diagnosis and Management of Patients with Thoracic Aortic Disease. Circulation. 2010; 121: N378-B923. Aortic aneurysm NOS (ICD10-I71.9) 5. Coronary artery calcifications, aortic Atherosclerosis (ICD10-I70.0) and Emphysema (ICD10-J43.9). Electronically Signed   By: Allegra Lai M.D.   On: 12/15/2022 12:55       IMPRESSION/PLAN: 1. Left upper lobe lung nodule suspicious for malignancy  Today, we discussed the pathology results and the nature of enlarging PET positive pulmonary nodules.  Although biopsy results were non diagnostic, the lung nodule continues to enlarge and is PET positive. These findings are highly suggestive of malignant disease. Patient has no evidence of disease spread on recent imaging. Dr. Mitzi Hansen is in agreement with plans for SBRT to the left upper lobe nodule.     We discussed the risks, benefits, short, and long term effects of radiotherapy, as well as the curative intent, and the patient is interested in proceeding. Dr. Mitzi Hansen discussed the delivery and logistics of radiotherapy and anticipates a course of 3-5 fractions of radiotherapy to the left upper lung nodule  Patient has a good understanding of the treatment plan and is enthusiastic about beginning treatment. All questions were answered. We will schedule CT simulation next week and plan to tentatively begin radiation treatment 1 week after simulation.  In a visit lasting 60 minutes, greater than 50% of the time was spent face to face discussing the patient's condition, in preparation for the discussion, and coordinating the patient's care.   The above documentation reflects my direct findings during this shared patient visit. Please see the separate note by Dr. Mitzi Hansen on this date for the remainder of the patient's plan of care.    Joyice Faster, [place]    **Disclaimer: This note was dictated with voice recognition software. Similar sounding words can inadvertently be transcribed and this note may contain transcription errors which may not have been corrected upon publication of note.**

## 2022-12-28 ENCOUNTER — Other Ambulatory Visit: Payer: Self-pay

## 2022-12-28 ENCOUNTER — Ambulatory Visit
Admission: RE | Admit: 2022-12-28 | Discharge: 2022-12-28 | Disposition: A | Discharge status: Home / Self Care | Payer: Medicare Other | Source: Ambulatory Visit | Attending: Radiation Oncology | Admitting: Radiation Oncology

## 2022-12-28 DIAGNOSIS — C3412 Malignant neoplasm of upper lobe, left bronchus or lung: Secondary | ICD-10-CM | POA: Diagnosis present

## 2023-01-07 ENCOUNTER — Ambulatory Visit
Admission: RE | Admit: 2023-01-07 | Discharge: 2023-01-07 | Disposition: A | Discharge status: Home / Self Care | Payer: Medicare Other | Source: Ambulatory Visit | Attending: Radiation Oncology | Admitting: Radiation Oncology

## 2023-01-07 DIAGNOSIS — C3412 Malignant neoplasm of upper lobe, left bronchus or lung: Secondary | ICD-10-CM | POA: Insufficient documentation

## 2023-01-09 ENCOUNTER — Other Ambulatory Visit: Payer: Self-pay

## 2023-01-09 DIAGNOSIS — C3412 Malignant neoplasm of upper lobe, left bronchus or lung: Secondary | ICD-10-CM | POA: Diagnosis not present

## 2023-01-09 LAB — RAD ONC ARIA SESSION SUMMARY
Course Elapsed Days: 0
Plan Fractions Treated to Date: 1
Plan Prescribed Dose Per Fraction: 12 Gy
Plan Total Fractions Prescribed: 5
Plan Total Prescribed Dose: 60 Gy
Reference Point Dosage Given to Date: 12 Gy
Reference Point Session Dosage Given: 12 Gy
Session Number: 1

## 2023-01-10 ENCOUNTER — Ambulatory Visit: Payer: Medicare Other | Admitting: Radiation Oncology

## 2023-01-11 ENCOUNTER — Ambulatory Visit
Admission: RE | Admit: 2023-01-11 | Discharge: 2023-01-11 | Disposition: A | Discharge status: Home / Self Care | Payer: Medicare Other | Source: Ambulatory Visit | Attending: Radiation Oncology | Admitting: Radiation Oncology

## 2023-01-11 ENCOUNTER — Other Ambulatory Visit: Payer: Self-pay

## 2023-01-11 ENCOUNTER — Ambulatory Visit (HOSPITAL_BASED_OUTPATIENT_CLINIC_OR_DEPARTMENT_OTHER): Payer: Medicare Other | Admitting: Pulmonary Disease

## 2023-01-11 DIAGNOSIS — C3412 Malignant neoplasm of upper lobe, left bronchus or lung: Secondary | ICD-10-CM | POA: Diagnosis not present

## 2023-01-11 LAB — RAD ONC ARIA SESSION SUMMARY
Course Elapsed Days: 2
Plan Fractions Treated to Date: 2
Plan Prescribed Dose Per Fraction: 12 Gy
Plan Total Fractions Prescribed: 5
Plan Total Prescribed Dose: 60 Gy
Reference Point Dosage Given to Date: 24 Gy
Reference Point Session Dosage Given: 12 Gy
Session Number: 2

## 2023-01-12 ENCOUNTER — Ambulatory Visit: Payer: Medicare Other | Admitting: Radiation Oncology

## 2023-01-15 ENCOUNTER — Other Ambulatory Visit: Payer: Self-pay

## 2023-01-15 ENCOUNTER — Ambulatory Visit
Admission: RE | Admit: 2023-01-15 | Discharge: 2023-01-15 | Disposition: A | Discharge status: Home / Self Care | Payer: Medicare Other | Source: Ambulatory Visit | Attending: Radiation Oncology | Admitting: Radiation Oncology

## 2023-01-15 DIAGNOSIS — C3412 Malignant neoplasm of upper lobe, left bronchus or lung: Secondary | ICD-10-CM | POA: Diagnosis not present

## 2023-01-15 LAB — RAD ONC ARIA SESSION SUMMARY
Course Elapsed Days: 6
Plan Fractions Treated to Date: 3
Plan Prescribed Dose Per Fraction: 12 Gy
Plan Total Fractions Prescribed: 5
Plan Total Prescribed Dose: 60 Gy
Reference Point Dosage Given to Date: 36 Gy
Reference Point Session Dosage Given: 12 Gy
Session Number: 3

## 2023-01-17 ENCOUNTER — Ambulatory Visit
Admission: RE | Admit: 2023-01-17 | Discharge: 2023-01-17 | Disposition: A | Discharge status: Home / Self Care | Payer: Medicare Other | Source: Ambulatory Visit | Attending: Radiation Oncology | Admitting: Radiation Oncology

## 2023-01-17 ENCOUNTER — Other Ambulatory Visit: Payer: Self-pay

## 2023-01-17 DIAGNOSIS — C3412 Malignant neoplasm of upper lobe, left bronchus or lung: Secondary | ICD-10-CM | POA: Diagnosis not present

## 2023-01-17 LAB — RAD ONC ARIA SESSION SUMMARY
Course Elapsed Days: 8
Plan Fractions Treated to Date: 4
Plan Prescribed Dose Per Fraction: 12 Gy
Plan Total Fractions Prescribed: 5
Plan Total Prescribed Dose: 60 Gy
Reference Point Dosage Given to Date: 48 Gy
Reference Point Session Dosage Given: 12 Gy
Session Number: 4

## 2023-01-19 ENCOUNTER — Ambulatory Visit
Admission: RE | Admit: 2023-01-19 | Discharge: 2023-01-19 | Disposition: A | Discharge status: Home / Self Care | Payer: Medicare Other | Source: Ambulatory Visit | Attending: Radiation Oncology | Admitting: Radiation Oncology

## 2023-01-19 ENCOUNTER — Other Ambulatory Visit: Payer: Self-pay

## 2023-01-19 ENCOUNTER — Encounter: Payer: Self-pay | Admitting: Radiation Oncology

## 2023-01-19 DIAGNOSIS — C3412 Malignant neoplasm of upper lobe, left bronchus or lung: Secondary | ICD-10-CM | POA: Diagnosis not present

## 2023-01-19 LAB — RAD ONC ARIA SESSION SUMMARY
Course Elapsed Days: 10
Plan Fractions Treated to Date: 5
Plan Prescribed Dose Per Fraction: 12 Gy
Plan Total Fractions Prescribed: 5
Plan Total Prescribed Dose: 60 Gy
Reference Point Dosage Given to Date: 60 Gy
Reference Point Session Dosage Given: 12 Gy
Session Number: 5

## 2023-01-22 ENCOUNTER — Ambulatory Visit
Admission: EM | Admit: 2023-01-22 | Discharge: 2023-01-22 | Disposition: A | Discharge status: Home / Self Care | Payer: Medicare Other | Attending: Family Medicine | Admitting: Family Medicine

## 2023-01-22 DIAGNOSIS — S39012A Strain of muscle, fascia and tendon of lower back, initial encounter: Secondary | ICD-10-CM | POA: Diagnosis not present

## 2023-01-22 MED ORDER — CYCLOBENZAPRINE HCL 5 MG PO TABS: 5.0000 mg | ORAL_TABLET | Freq: Three times a day (TID) | ORAL | 0 refills | Status: DC | PRN

## 2023-01-22 NOTE — ED Provider Notes (Signed)
[facility]    CSN: [id number] Arrival date & time: 01/22/23  1544      History   Chief Complaint No chief complaint on file.   HPI KALLIN HENK is a 82 y.o. male.   Presenting today with 3-day history of right lower back pain, stiffness, soreness after slipping in the bathroom and catching himself on the shower curtain.  States it feels like he pulled a muscle in his back.  Denies decreased range of motion, numbness, tingling, midline back pain and states he did not have a full fall or hit his head during the incident.  So far trying heat, Tylenol with minimal relief.    Past Medical History:  Diagnosis Date   A-fib [facility])    Arthritis    right hand   Bradycardia    COPD (chronic obstructive pulmonary disease) (HCC)    Dyspnea    Dysrhythmia    Frequent urination    GERD (gastroesophageal reflux disease)    Hypercholesterolemia    Hypertension    MI (myocardial infarction) ([place])    Pneumonia    Prostate cancer ([place])    RADIATION TX AND SCHEDULED FOR SEED IMPLANTS 02-28-12   Sleep apnea    uses a cpap   Stroke ([place])    2011;ST memory loss, no other deficits.   Urinary urgency     Patient Active Problem List   Diagnosis Date Noted   OSA (obstructive sleep apnea) 12/21/2022   COPD (chronic obstructive pulmonary disease) ([place]) 11/02/2022   Lung nodule 09/21/2022   COPD with acute exacerbation ([place]) 09/30/2021   CAP (community acquired pneumonia) 09/30/2021   Hemoptysis 09/30/2021   Mass of upper lobe of left lung 06/08/2020   Diarrhea 12/31/2019   Melena 12/31/2019   Essential hypertension 12/11/2016   Bradycardia 12/11/2016   Emphysema of lung ([place]) 12/11/2016   A-fib ([place]) 02/11/2015   Hematochezia 08/04/2014   Prostate cancer ([place]) 12/07/2011    Past Surgical History:  Procedure Laterality Date   BRONCHIAL BIOPSY  10/24/2022   Procedure: BRONCHIAL BIOPSIES;  Surgeon: Garner Nash, DO;  Location: [facility];  Service: Pulmonary;;    BRONCHIAL NEEDLE ASPIRATION BIOPSY  10/24/2022   Procedure: BRONCHIAL NEEDLE ASPIRATION BIOPSIES;  Surgeon: Garner Nash, DO;  Location: [facility];  Service: Pulmonary;;   BRONCHIAL WASHINGS  10/24/2022   Procedure: BRONCHIAL WASHINGS;  Surgeon: Garner Nash, DO;  Location: [facility];  Service: Pulmonary;;   CATARACT EXTRACTION W/PHACO Right 11/06/2013   Procedure: CATARACT EXTRACTION PHACO AND INTRAOCULAR LENS PLACEMENT ([place]);  Surgeon: Tonny Branch, MD;  [facility];  Service: Ophthalmology;  Laterality: Right;  CDE 11.67   CATARACT EXTRACTION W/PHACO Left 11/25/2015   Procedure: CATARACT EXTRACTION PHACO AND INTRAOCULAR LENS PLACEMENT ; CDE:  8.15;  Surgeon: Tonny Branch, MD;  Location: [facility];  Service: Ophthalmology;  Laterality: Left;   COLONOSCOPY  10/03/2006   [id number] rectal polyps cold biopsied/removed, otherwise normal rectum/Sigmoid diverticula.  Remainder of colon mucosa appeared normal   COLONOSCOPY N/A 08/19/2014   RMR: Radiation proctitis-status post APC ablation. Colonic diverticulosis.   CYSTOSCOPY  02/28/2012   Procedure: CYSTOSCOPY;  Surgeon: Bernestine Amass, MD;  [facility];  Service: Urology;  Laterality: N/A;   no seeds noted in bladder   RADIOACTIVE SEED IMPLANT  02/28/2012   Procedure: RADIOACTIVE SEED IMPLANT;  Surgeon: Bernestine Amass, MD;  Location: [facility];  Service: Urology;  Laterality: N/A;  67seeds implanted  Home Medications    Prior to Admission medications   Medication Sig Start Date End Date Taking? Authorizing Provider  cyclobenzaprine (FLEXERIL) 5 MG tablet Take 1 tablet (5 mg total) by mouth 3 (three) times daily as needed for muscle spasms. Do not drink alcohol or drive while taking this medication.  May cause drowsiness. 01/22/23  Yes Volney American, PA-C  acetaminophen (TYLENOL) 500 MG tablet Take 2 tablets (1,000 mg total) by mouth every 6 (six) hours as needed. Patient  taking differently: Take 1,000 mg by mouth every 6 (six) hours as needed for mild pain. 03/18/22   Talbot Grumbling, FNP  amLODipine (NORVASC) 2.5 MG tablet Take 2.5 mg by mouth daily. 04/07/22   [provider]  APPLE CIDER VINEGAR PO Take 450 mg by mouth daily.    [provider]  b complex vitamins capsule Take 1 capsule by mouth daily.    [provider]  escitalopram (LEXAPRO) 5 MG tablet Take 5 mg by mouth daily.    [provider]  Glycopyrrolate-Formoterol (BEVESPI AEROSPHERE) 9-4.8 MCG/ACT AERO Inhale 2 puffs into the lungs 2 (two) times daily. 06/12/22   Chesley Mires, MD  metoprolol succinate (TOPROL XL) 25 MG 24 hr tablet Take 1 tablet (25 mg total) by mouth daily. 07/29/20   Strader, Fransisco Hertz, PA-C  omeprazole (PRILOSEC) 20 MG capsule Take 20 mg by mouth daily.    [provider]  rivaroxaban (XARELTO) 20 MG TABS tablet Take 1 tablet (20 mg total) by mouth daily with supper. 03/16/15   Arnoldo Lenis, MD  simvastatin (ZOCOR) 40 MG tablet Take 40 mg by mouth every other day. 12/15/14   [provider]  solifenacin (VESICARE) 10 MG tablet Take 10 mg by mouth daily.  02/05/19   [provider]  tadalafil (CIALIS) 10 MG tablet Take 1 tablet (10 mg total) by mouth as needed for erectile dysfunction (30 minutes prior to intercourse). 12/15/22   Arnoldo Lenis, MD  tamsulosin (FLOMAX) 0.4 MG CAPS capsule Take 0.4 mg by mouth daily.    [provider]    Family History Family History  Problem Relation Age of Onset   Stroke Mother    Hypertension Mother    Colon cancer Sister        in her 79s   Breast cancer Sister    COPD Sister    Hypertension Sister    Pneumonia Sister    Heart attack Brother    COPD Son    COPD Maternal Aunt    Cancer Neg Hx    Liver disease Neg Hx     Social History Social History   Tobacco Use   Smoking status: Former    Packs/day: 2.00    Years: 35.00    Total pack years:  70.00    Types: Cigarettes    Quit date: 02/21/1991    Years since quitting: 31.9   Smokeless tobacco: Current    Types: Chew   Tobacco comments:    CHEW TOBACCO FOR 20 YRS   Vaping Use   Vaping Use: Never used  Substance Use Topics   Alcohol use: Yes    Alcohol/week: 14.0 standard drinks of alcohol    Types: 14 Shots of liquor per week    Comment: 4 ounces daily   Drug use: No     Allergies   Patient has no known allergies.   Review of Systems Review of Systems Per HPI  Physical Exam Triage Vital  Signs ED Triage Vitals  Enc Vitals Group     BP 01/22/23 1644 (!) 153/88     Pulse Rate 01/22/23 1644 78     Resp 01/22/23 1644 20     Temp 01/22/23 1644 98.1 F (36.7 C)     Temp Source 01/22/23 1644 Oral     SpO2 01/22/23 1644 95 %     Weight --      Height --      Head Circumference --      Peak Flow --      Pain Score 01/22/23 1648 8     Pain Loc --      Pain Edu? --      Excl. in [place]? --    No data found.  Updated Vital Signs BP (!) 153/88 (BP Location: Right Arm)   Pulse 78   Temp 98.1 F (36.7 C) (Oral)   Resp 20   SpO2 95%   Visual Acuity Right Eye Distance:   Left Eye Distance:   Bilateral Distance:    Right Eye Near:   Left Eye Near:    Bilateral Near:     Physical Exam Vitals and nursing note reviewed.  Constitutional:      Appearance: Normal appearance.  HENT:     Head: Atraumatic.  Eyes:     Extraocular Movements: Extraocular movements intact.     Conjunctiva/sclera: Conjunctivae normal.  Cardiovascular:     Rate and Rhythm: Normal rate and regular rhythm.  Pulmonary:     Effort: Pulmonary effort is normal.     Breath sounds: Normal breath sounds.  Musculoskeletal:        General: Tenderness present. No swelling or deformity. Normal range of motion.     Cervical back: Normal range of motion and neck supple.     Comments: Mild tenderness to palpation right lateral lumbar musculature.  No midline spinal tenderness to palpation  diffusely.  Negative straight leg raise bilaterally, all 4 extremities with full range of motion and strength  Skin:    General: Skin is warm and dry.  Neurological:     General: No focal deficit present.     Mental Status: He is oriented to person, place, and time.     Motor: No weakness.     Gait: Gait normal.     Comments: All 4 extremities neurovascularly intact  Psychiatric:        Mood and Affect: Mood normal.        Thought Content: Thought content normal.        Judgment: Judgment normal.      UC Treatments / Results  Labs (all labs ordered are listed, but only abnormal results are displayed) Labs Reviewed - No data to display  EKG   Radiology No results found.  Procedures Procedures (including critical care time)  Medications Ordered in UC Medications - No data to display  Initial Impression / Assessment and Plan / UC Course  I have reviewed the triage vital signs and the nursing notes.  Pertinent labs & imaging results that were available during my care of the patient were reviewed by me and considered in my medical decision making (see chart for details).     Consistent with muscular strain, treat with Flexeril, heat, Tylenol, massage, stretches and follow-up for worsening symptoms.  Final Clinical Impressions(s) / UC Diagnoses   Final diagnoses:  Strain of lumbar region, initial encounter   Discharge Instructions   None    ED Prescriptions  Medication Sig Dispense Auth. Provider   cyclobenzaprine (FLEXERIL) 5 MG tablet Take 1 tablet (5 mg total) by mouth 3 (three) times daily as needed for muscle spasms. Do not drink alcohol or drive while taking this medication.  May cause drowsiness. 15 tablet Volney American, [place]      PDMP not reviewed this encounter.   Volney American, [place] 01/22/23 1840

## 2023-01-22 NOTE — Progress Notes (Signed)
                                                                                                                                                             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  Intent: Curative  Radiation Treatment Dates:  01/09/2023 through 01/19/2023 SBRT Treatment Site Technique Total Dose (Gy) Dose per Fx (Gy) Completed Fx Beam Energies  Lung, Left:  LUL Target IMRT 60/60 12 5/5 6XFFF   Narrative: The patient tolerated radiation therapy relatively well.    Plan: The patient will receive a call in about one month from the radiation oncology department. He will be followed in our clinic with CT surveillance per NCCN guidelines. It appears pulmonary has already ordered his scans, so I will reach out to see how they prefer for him to be followed in surveillance.   ________________________________________________    Carola Rhine, PAC

## 2023-01-22 NOTE — ED Triage Notes (Signed)
Pt reports he has a pulled muscle in his back due to stumbling in the bathroom x 3 days. He did not fully fall as he grabbed on to the shower curtain. Reports low right side back pain with ROM.

## 2023-01-23 ENCOUNTER — Other Ambulatory Visit: Payer: Self-pay | Admitting: Radiation Oncology

## 2023-01-23 DIAGNOSIS — C3412 Malignant neoplasm of upper lobe, left bronchus or lung: Secondary | ICD-10-CM

## 2023-01-23 DIAGNOSIS — R911 Solitary pulmonary nodule: Secondary | ICD-10-CM

## 2023-01-23 NOTE — Progress Notes (Signed)
After communicating with pulmonary, we will order the patient's post treatment CT scans for lung cancer surveillance.

## 2023-02-01 ENCOUNTER — Encounter: Payer: Self-pay | Admitting: Radiology

## 2023-02-14 ENCOUNTER — Telehealth: Payer: Self-pay | Admitting: Adult Health

## 2023-02-14 NOTE — Telephone Encounter (Signed)
Patient would like to know if he needs a CT scan with or without contrast. Radiation would like with contrast. Patient phone number is [phone] and [phone].

## 2023-02-14 NOTE — Telephone Encounter (Signed)
Spoke with pt who states that rad oncology ordered a chest CT with contrast. Tammy has already ordered a chest CT without contrast which has been scheduled on 03/16/23. Pt stated he would call and tell them he needed to know why he needed an additional CT and why he needed contrast as I could not see that information in pt's chart. Nothing further needed at this time.

## 2023-02-19 NOTE — Progress Notes (Signed)
H&P  Chief Complaint: Erectile dysfunction  History of Present Illness: 82 year old male presents today, self-referred for evaluation and management of erectile dysfunction.  Past urologic history is significant for prostate cancer, treated with I-125 brachytherapy in 2013.  He has PSA followed by his primary care provider, Luberta Robertson.  He says that these PSAs have been low.  His wife died about a year ago.  He has had ED for quite some time.  He has tried both Viagra and Cialis without effect.  Past Medical History:  Diagnosis Date   A-fib [facility])    Arthritis    right hand   Bradycardia    COPD (chronic obstructive pulmonary disease) (HCC)    Dyspnea    Dysrhythmia    Frequent urination    GERD (gastroesophageal reflux disease)    Hypercholesterolemia    Hypertension    MI (myocardial infarction) ([place])    Pneumonia    Prostate cancer ([place])    RADIATION TX AND SCHEDULED FOR SEED IMPLANTS 02-28-12   Sleep apnea    uses a cpap   Stroke ([place])    2011;ST memory loss, no other deficits.   Urinary urgency     Past Surgical History:  Procedure Laterality Date   BRONCHIAL BIOPSY  10/24/2022   Procedure: BRONCHIAL BIOPSIES;  Surgeon: Garner Nash, DO;  Location: [place] [facility];  Service: Pulmonary;;   BRONCHIAL NEEDLE ASPIRATION BIOPSY  10/24/2022   Procedure: BRONCHIAL NEEDLE ASPIRATION BIOPSIES;  Surgeon: Garner Nash, DO;  Location: [place] [facility];  Service: Pulmonary;;   BRONCHIAL WASHINGS  10/24/2022   Procedure: BRONCHIAL WASHINGS;  Surgeon: Garner Nash, DO;  Location: [place];  Service: Pulmonary;;   CATARACT EXTRACTION W/PHACO Right 11/06/2013   Procedure: CATARACT EXTRACTION PHACO AND INTRAOCULAR LENS PLACEMENT ([facility]);  Surgeon: Tonny Branch, MD;  [facility];  Service: Ophthalmology;  Laterality: Right;  CDE 11.67   CATARACT EXTRACTION W/PHACO Left 11/25/2015   Procedure: CATARACT EXTRACTION PHACO AND INTRAOCULAR LENS PLACEMENT ; CDE:  8.15;  Surgeon:  Tonny Branch, MD;  [facility];  Service: Ophthalmology;  Laterality: Left;   COLONOSCOPY  10/03/2006   [id number] rectal polyps cold biopsied/removed, otherwise normal rectum/Sigmoid diverticula.  Remainder of colon mucosa appeared normal   COLONOSCOPY N/A 08/19/2014   RMR: Radiation proctitis-status post APC ablation. Colonic diverticulosis.   CYSTOSCOPY  02/28/2012   Procedure: CYSTOSCOPY;  Surgeon: Bernestine Amass, MD;  Location: [facility];  Service: Urology;  Laterality: N/A;   no seeds noted in bladder   RADIOACTIVE SEED IMPLANT  02/28/2012   Procedure: RADIOACTIVE SEED IMPLANT;  Surgeon: Bernestine Amass, MD;  Location: [facility];  Service: Urology;  Laterality: N/A;  67seeds implanted     Home Medications:  Allergies as of 02/20/2023   No Known Allergies      Medication List        Accurate as of February 19, 2023  7:45 PM. If you have any questions, ask your nurse or doctor.          acetaminophen 500 MG tablet Commonly known as: TYLENOL Take 2 tablets (1,000 mg total) by mouth every 6 (six) hours as needed. What changed: reasons to take this   amLODipine 2.5 MG tablet Commonly known as: NORVASC Take 2.5 mg by mouth daily.   APPLE CIDER VINEGAR PO Take 450 mg by mouth daily.   b complex vitamins capsule Take 1 capsule by mouth daily.   Bevespi Aerosphere 9-4.8 MCG/ACT [employer]  Generic drug: Glycopyrrolate-Formoterol Inhale 2 puffs into the lungs 2 (two) times daily.   cyclobenzaprine 5 MG tablet Commonly known as: FLEXERIL Take 1 tablet (5 mg total) by mouth 3 (three) times daily as needed for muscle spasms. Do not drink alcohol or drive while taking this medication.  May cause drowsiness.   escitalopram 5 MG tablet Commonly known as: LEXAPRO Take 5 mg by mouth daily.   metoprolol succinate 25 MG 24 hr tablet Commonly known as: Toprol XL Take 1 tablet (25 mg total) by mouth daily.   omeprazole 20 MG capsule Commonly known  as: PRILOSEC Take 20 mg by mouth daily.   rivaroxaban 20 MG Tabs tablet Commonly known as: XARELTO Take 1 tablet (20 mg total) by mouth daily with supper.   simvastatin 40 MG tablet Commonly known as: ZOCOR Take 40 mg by mouth every other day.   solifenacin 10 MG tablet Commonly known as: VESICARE Take 10 mg by mouth daily.   tadalafil 10 MG tablet Commonly known as: Cialis Take 1 tablet (10 mg total) by mouth as needed for erectile dysfunction (30 minutes prior to intercourse).   tamsulosin 0.4 MG Caps capsule Commonly known as: FLOMAX Take 0.4 mg by mouth daily.        Allergies: No Known Allergies  Family History  Problem Relation Age of Onset   Stroke Mother    Hypertension Mother    Colon cancer Sister        in her 90s   Breast cancer Sister    COPD Sister    Hypertension Sister    Pneumonia Sister    Heart attack Brother    COPD Son    COPD Maternal Aunt    Cancer Neg Hx    Liver disease Neg Hx     Social History:  reports that he quit smoking about 32 years ago. His smoking use included cigarettes. He has a 70.00 pack-year smoking history. His smokeless tobacco use includes chew. He reports current alcohol use of about 14.0 standard drinks of alcohol per week. He reports that he does not use drugs.  ROS: A complete review of systems was performed.  All systems are negative except for pertinent findings as noted.  Physical Exam:  Vital signs in last 24 hours: There were no vitals taken for this visit. Constitutional:  Alert and oriented, No acute distress Cardiovascular: Regular rate  Respiratory: Normal respiratory effort GI: Abdomen is soft, nontender, nondistended, no abdominal masses. No CVAT.  Genitourinary: Phallus U uncircumcised.  Slightly buried.  Right hydrocele present.  Left testicle atrophic Lymphatic: No lymphadenopathy Neurologic: Grossly intact, no focal deficits Psychiatric: Normal mood and affect  I have reviewed prior pt  notes  I have reviewed notes from referring/previous physicians  I have reviewed urinalysis results  I have independently reviewed prior imaging   Impression/Assessment:  1.  History of prostate cancer, status post brachytherapy 11 years ago, apparently with normal PSA  2.  ED, refractory  Plan:  I discussed vacuum erection device as well as prostaglandin injection with the patient.  I favor the latter.  I have provided him a prescription for prostaglandin E1, 10 mcg with 5 vials and refills  I will have him come back next available to teach injection.

## 2023-02-20 ENCOUNTER — Encounter: Payer: Self-pay | Admitting: Urology

## 2023-02-20 ENCOUNTER — Ambulatory Visit: Payer: Medicare Other | Admitting: Urology

## 2023-02-20 VITALS — BP 112/72 | HR 67 | Ht 74.0 in | Wt 225.0 lb

## 2023-02-20 DIAGNOSIS — Z8546 Personal history of malignant neoplasm of prostate: Secondary | ICD-10-CM

## 2023-02-20 DIAGNOSIS — N5201 Erectile dysfunction due to arterial insufficiency: Secondary | ICD-10-CM

## 2023-02-20 LAB — URINALYSIS, ROUTINE W REFLEX MICROSCOPIC
Bilirubin, UA: NEGATIVE
Glucose, UA: NEGATIVE
Leukocytes,UA: NEGATIVE
Nitrite, UA: NEGATIVE
RBC, UA: NEGATIVE
Specific Gravity, UA: 1.02 (ref 1.005–1.030)
Urobilinogen, Ur: 4 mg/dL — ABNORMAL HIGH (ref 0.2–1.0)
pH, UA: 6 (ref 5.0–7.5)

## 2023-02-20 LAB — MICROSCOPIC EXAMINATION: Bacteria, UA: NONE SEEN

## 2023-03-02 ENCOUNTER — Ambulatory Visit
Admission: EM | Admit: 2023-03-02 | Discharge: 2023-03-02 | Disposition: A | Discharge status: Home / Self Care | Payer: Medicare Other | Attending: Family Medicine | Admitting: Family Medicine

## 2023-03-02 DIAGNOSIS — H1033 Unspecified acute conjunctivitis, bilateral: Secondary | ICD-10-CM | POA: Diagnosis not present

## 2023-03-02 DIAGNOSIS — H1131 Conjunctival hemorrhage, right eye: Secondary | ICD-10-CM

## 2023-03-02 DIAGNOSIS — B370 Candidal stomatitis: Secondary | ICD-10-CM

## 2023-03-02 MED ORDER — OLOPATADINE HCL 0.1 % OP SOLN: 1.0000 [drp] | Freq: Two times a day (BID) | OPHTHALMIC | 1 refills | Status: DC

## 2023-03-02 MED ORDER — NYSTATIN 100000 UNIT/ML MT SUSP: 500000.0000 [IU] | Freq: Four times a day (QID) | OROMUCOSAL | 0 refills | Status: DC

## 2023-03-02 NOTE — ED Triage Notes (Signed)
Pt presents with c/o redness to the right eye. States it has been blood shot x 2 months.   Reports he can see clearly from it. States he has lens implants.

## 2023-03-02 NOTE — ED Provider Notes (Signed)
[facility]    CSN: [id number] Arrival date & time: 03/02/23  1240      History   Chief Complaint Chief Complaint  Patient presents with   Eye Problem    HPI Jonathan Greene is a 82 y.o. male.   Patient presenting today with about 2 months of eye irritation, injection, morning crusting, itching.  States he has been rubbing his eyes a lot due to the symptoms.  This morning noticed his right eye was completely bloodshot despite no new injury to the area.  Denies visual change, headache, nausea, vomiting, decreased range of motion of the eye.  Has not tried anything for symptoms thus far.  Of note, does take Xarelto.  He is also having significant pain to the roof of his mouth and tongue with white coating to the point where he cannot put his dentures and on the top.  Not tried anything for this either.    Past Medical History:  Diagnosis Date   A-fib [facility])    Arthritis    right hand   Bradycardia    COPD (chronic obstructive pulmonary disease) (HCC)    Dyspnea    Dysrhythmia    Frequent urination    GERD (gastroesophageal reflux disease)    Hypercholesterolemia    Hypertension    MI (myocardial infarction) ([place])    Pneumonia    Prostate cancer ([place])    RADIATION TX AND SCHEDULED FOR SEED IMPLANTS 02-28-12   Sleep apnea    uses a cpap   Stroke ([place])    2011;ST memory loss, no other deficits.   Urinary urgency     Patient Active Problem List   Diagnosis Date Noted   OSA (obstructive sleep apnea) 12/21/2022   COPD (chronic obstructive pulmonary disease) ([place]) 11/02/2022   Lung nodule 09/21/2022   COPD with acute exacerbation ([place]) 09/30/2021   CAP (community acquired pneumonia) 09/30/2021   Hemoptysis 09/30/2021   Mass of upper lobe of left lung 06/08/2020   Diarrhea 12/31/2019   Melena 12/31/2019   Essential hypertension 12/11/2016   Bradycardia 12/11/2016   Emphysema of lung ([place]) 12/11/2016   A-fib ([place]) 02/11/2015   Hematochezia 08/04/2014    Prostate cancer ([place]) 12/07/2011    Past Surgical History:  Procedure Laterality Date   BRONCHIAL BIOPSY  10/24/2022   Procedure: BRONCHIAL BIOPSIES;  Surgeon: Garner Nash, DO;  Location: [place] [facility];  Service: Pulmonary;;   BRONCHIAL NEEDLE ASPIRATION BIOPSY  10/24/2022   Procedure: BRONCHIAL NEEDLE ASPIRATION BIOPSIES;  Surgeon: Garner Nash, DO;  Location: [place] [facility];  Service: Pulmonary;;   BRONCHIAL WASHINGS  10/24/2022   Procedure: BRONCHIAL WASHINGS;  Surgeon: Garner Nash, DO;  Location: [facility];  Service: Pulmonary;;   CATARACT EXTRACTION W/PHACO Right 11/06/2013   Procedure: CATARACT EXTRACTION PHACO AND INTRAOCULAR LENS PLACEMENT ([place]);  Surgeon: Tonny Branch, MD;  Location: [facility];  Service: Ophthalmology;  Laterality: Right;  CDE 11.67   CATARACT EXTRACTION W/PHACO Left 11/25/2015   Procedure: CATARACT EXTRACTION PHACO AND INTRAOCULAR LENS PLACEMENT ; CDE:  8.15;  Surgeon: Tonny Branch, MD;  Location: [facility];  Service: Ophthalmology;  Laterality: Left;   COLONOSCOPY  10/03/2006   [id number] rectal polyps cold biopsied/removed, otherwise normal rectum/Sigmoid diverticula.  Remainder of colon mucosa appeared normal   COLONOSCOPY N/A 08/19/2014   RMR: Radiation proctitis-status post APC ablation. Colonic diverticulosis.   CYSTOSCOPY  02/28/2012   Procedure: CYSTOSCOPY;  Surgeon: Bernestine Amass, MD;  Location: [facility];  Service:  Urology;  Laterality: N/A;   no seeds noted in bladder   RADIOACTIVE SEED IMPLANT  02/28/2012   Procedure: RADIOACTIVE SEED IMPLANT;  Surgeon: Bernestine Amass, MD;  Location: [facility];  Service: Urology;  Laterality: N/A;  67seeds implanted        Home Medications    Prior to Admission medications   Medication Sig Start Date End Date Taking? Authorizing Provider  nystatin (MYCOSTATIN) 100000 UNIT/ML suspension Take 5 mLs (500,000 Units total) by mouth 4 (four) times daily. 03/02/23  Yes Volney American, PA-C  olopatadine (PATADAY) 0.1 % ophthalmic solution Place 1 drop into both eyes 2 (two) times daily. 03/02/23  Yes Volney American, PA-C  acetaminophen (TYLENOL) 500 MG tablet Take 2 tablets (1,000 mg total) by mouth every 6 (six) hours as needed. Patient taking differently: Take 1,000 mg by mouth every 6 (six) hours as needed for mild pain. 03/18/22   Talbot Grumbling, FNP  amLODipine (NORVASC) 2.5 MG tablet Take 2.5 mg by mouth daily. 04/07/22   [provider]  APPLE CIDER VINEGAR PO Take 450 mg by mouth daily.    [provider]  b complex vitamins capsule Take 1 capsule by mouth daily.    [provider]  cyclobenzaprine (FLEXERIL) 5 MG tablet Take 1 tablet (5 mg total) by mouth 3 (three) times daily as needed for muscle spasms. Do not drink alcohol or drive while taking this medication.  May cause drowsiness. Patient not taking: Reported on 02/20/2023 01/22/23   Volney American, PA-C  escitalopram (LEXAPRO) 5 MG tablet Take 5 mg by mouth daily.    [provider]  Glycopyrrolate-Formoterol (BEVESPI AEROSPHERE) 9-4.8 MCG/ACT AERO Inhale 2 puffs into the lungs 2 (two) times daily. 06/12/22   Chesley Mires, MD  metoprolol succinate (TOPROL XL) 25 MG 24 hr tablet Take 1 tablet (25 mg total) by mouth daily. 07/29/20   Strader, Fransisco Hertz, PA-C  omeprazole (PRILOSEC) 20 MG capsule Take 20 mg by mouth daily.    [provider]  rivaroxaban (XARELTO) 20 MG TABS tablet Take 1 tablet (20 mg total) by mouth daily with supper. 03/16/15   Arnoldo Lenis, MD  simvastatin (ZOCOR) 40 MG tablet Take 40 mg by mouth every other day. 12/15/14   [provider]  solifenacin (VESICARE) 10 MG tablet Take 10 mg by mouth daily.  02/05/19   [provider]  tadalafil (CIALIS) 10 MG tablet Take 1 tablet (10 mg total) by mouth as needed for erectile dysfunction (30 minutes prior to intercourse). Patient not taking: Reported on 02/20/2023  12/15/22   Arnoldo Lenis, MD  tamsulosin (FLOMAX) 0.4 MG CAPS capsule Take 0.4 mg by mouth daily.    [provider]    Family History Family History  Problem Relation Age of Onset   Stroke Mother    Hypertension Mother    Colon cancer Sister        in her 7s   Breast cancer Sister    COPD Sister    Hypertension Sister    Pneumonia Sister    Heart attack Brother    COPD Son    COPD Maternal Aunt    Cancer Neg Hx    Liver disease Neg Hx     Social History Social History   Tobacco Use   Smoking status: Former    Packs/day: 2.00    Years: 35.00    Additional pack years: 0.00    Total  pack years: 70.00    Types: Cigarettes    Quit date: 02/21/1991    Years since quitting: 32.0   Smokeless tobacco: Current    Types: Chew   Tobacco comments:    CHEW TOBACCO FOR 20 YRS   Vaping Use   Vaping Use: Never used  Substance Use Topics   Alcohol use: Yes    Alcohol/week: 14.0 standard drinks of alcohol    Types: 14 Shots of liquor per week    Comment: 4 ounces daily   Drug use: No     Allergies   Patient has no known allergies.   Review of Systems Review of Systems Per HPI  Physical Exam Triage Vital Signs ED Triage Vitals [03/02/23 1325]  Enc Vitals Group     BP 122/78     Pulse Rate 77     Resp 20     Temp 98.2 F (36.8 C)     Temp Source Oral     SpO2 92 %     Weight      Height      Head Circumference      Peak Flow      Pain Score 0     Pain Loc      Pain Edu?      Excl. in [place]?    No data found.  Updated Vital Signs BP 122/78 (BP Location: Right Arm)   Pulse 77   Temp 98.2 F (36.8 C) (Oral)   Resp 20   SpO2 92%   Visual Acuity Right Eye Distance: 20/25 Left Eye Distance: 20/30 Bilateral Distance: 20/30  Right Eye Near:   Left Eye Near:    Bilateral Near:     Physical Exam Vitals and nursing note reviewed.  Constitutional:      Appearance: Normal appearance.  HENT:     Head: Atraumatic.     Nose: Nose normal.      Mouth/Throat:     Mouth: Mucous membranes are moist.     Comments: Erythematous palate and posterior oropharynx, thick white coating present on tongue Eyes:     Extraocular Movements: Extraocular movements intact.     Pupils: Pupils are equal, round, and reactive to light.     Comments: Diffuse subconjunctival hemorrhage to the right eye  Cardiovascular:     Rate and Rhythm: Normal rate and regular rhythm.  Pulmonary:     Effort: Pulmonary effort is normal.     Breath sounds: Normal breath sounds.  Musculoskeletal:        General: Normal range of motion.     Cervical back: Normal range of motion and neck supple.  Skin:    General: Skin is warm and dry.  Neurological:     General: No focal deficit present.     Mental Status: He is oriented to person, place, and time.  Psychiatric:        Mood and Affect: Mood normal.        Thought Content: Thought content normal.        Judgment: Judgment normal.      UC Treatments / Results  Labs (all labs ordered are listed, but only abnormal results are displayed) Labs Reviewed - No data to display  EKG  Radiology No results found.  Procedures Procedures (including critical care time)  Medications Ordered in UC Medications - No data to display  Initial Impression / Assessment and Plan / UC Course  I have reviewed the triage vital signs and the  nursing notes.  Pertinent labs & imaging results that were available during my care of the patient were reviewed by me and considered in my medical decision making (see chart for details).     Discussed subconjunctival hemorrhage, vision reassuring today, discussed supportive measures and ophthalmology follow-up if not resolving for this.  Suspect the ongoing irritation of the eyes might be seasonal allergy related so antihistamine drops sent to try consistently to see if this helps.  Cool compresses, good handwashing additionally.  Regarding his oral thrush, nystatin rinse, good washing  of dentures and avoidance until fully healed. Final Clinical Impressions(s) / UC Diagnoses   Final diagnoses:  Subconjunctival hemorrhage of right eye  Acute conjunctivitis of both eyes, unspecified acute conjunctivitis type  Oral thrush   Discharge Instructions   None    ED Prescriptions     Medication Sig Dispense Auth. Provider   olopatadine (PATADAY) 0.1 % ophthalmic solution Place 1 drop into both eyes 2 (two) times daily. 5 mL Volney American, PA-C   nystatin (MYCOSTATIN) 100000 UNIT/ML suspension Take 5 mLs (500,000 Units total) by mouth 4 (four) times daily. 120 mL Volney American, [place]      PDMP not reviewed this encounter.   Volney American, [place] 03/02/23 1445

## 2023-03-05 ENCOUNTER — Ambulatory Visit
Admission: RE | Admit: 2023-03-05 | Discharge: 2023-03-05 | Disposition: A | Discharge status: Home / Self Care | Payer: Medicare Other | Source: Ambulatory Visit | Attending: Nurse Practitioner | Admitting: Nurse Practitioner

## 2023-03-05 NOTE — Progress Notes (Signed)
  Radiation Oncology         [phone] ________________________________  Name: Jonathan Greene MRN: [id number]  Date of Service: 03/05/2023  DOB: 02-Apr-1941  Post Treatment Telephone Note  Diagnosis:  Putative Stage IA3, cT1cN0M0, NSCLC of the LUL.  Intent: Curative  Radiation Treatment Dates:  01/09/2023 through 01/19/2023 SBRT Treatment Site Technique Total Dose (Gy) Dose per Fx (Gy) Completed Fx Beam Energies  Lung, Left:  LUL Target IMRT 60/60 12 5/5 6XFFF   (as documented in provider EOT note)   The patient was available for call today.   Symptoms of fatigue have improved since completing therapy.  Symptoms of skin changes have improved since completing therapy.  Symptoms of esophagitis have improved since completing therapy.   The patient will be followed in our clinic with CT surveillance per NCCN guidelines, and was encouraged to call if he develops concerns or questions regarding radiation.  This concludes the interview.   Leandra Kern, LPN

## 2023-03-16 ENCOUNTER — Ambulatory Visit (HOSPITAL_COMMUNITY)
Admission: RE | Admit: 2023-03-16 | Discharge: 2023-03-16 | Disposition: A | Discharge status: Home / Self Care | Payer: Medicare Other | Source: Ambulatory Visit | Attending: Adult Health | Admitting: Adult Health

## 2023-03-16 DIAGNOSIS — R911 Solitary pulmonary nodule: Secondary | ICD-10-CM | POA: Diagnosis present

## 2023-03-19 ENCOUNTER — Ambulatory Visit: Payer: Medicare Other | Admitting: Radiation Oncology

## 2023-03-20 ENCOUNTER — Other Ambulatory Visit: Payer: Self-pay | Admitting: Family Medicine

## 2023-03-20 ENCOUNTER — Ambulatory Visit: Payer: Medicare Other | Admitting: Urology

## 2023-03-20 VITALS — BP 132/83 | HR 81

## 2023-03-20 DIAGNOSIS — N5201 Erectile dysfunction due to arterial insufficiency: Secondary | ICD-10-CM | POA: Diagnosis not present

## 2023-03-20 DIAGNOSIS — Z8546 Personal history of malignant neoplasm of prostate: Secondary | ICD-10-CM

## 2023-03-20 NOTE — Progress Notes (Signed)
History of Present Illness: Here for training for penile injections.  He has a 10 mcg dose of alprostadil.  Past Medical History:  Diagnosis Date   A-fib [facility])    Arthritis    right hand   Bradycardia    COPD (chronic obstructive pulmonary disease) (HCC)    Dyspnea    Dysrhythmia    Frequent urination    GERD (gastroesophageal reflux disease)    Hypercholesterolemia    Hypertension    MI (myocardial infarction) (HCC)    Pneumonia    Prostate cancer (HCC)    RADIATION TX AND SCHEDULED FOR SEED IMPLANTS 02-28-12   Sleep apnea    uses a cpap   Stroke (HCC)    2011;ST memory loss, no other deficits.   Urinary urgency     Past Surgical History:  Procedure Laterality Date   BRONCHIAL BIOPSY  10/24/2022   Procedure: BRONCHIAL BIOPSIES;  Surgeon: Josephine Igo, DO;  Location: [facility];  Service: Pulmonary;;   BRONCHIAL NEEDLE ASPIRATION BIOPSY  10/24/2022   Procedure: BRONCHIAL NEEDLE ASPIRATION BIOPSIES;  Surgeon: Josephine Igo, DO;  Location: [facility];  Service: Pulmonary;;   BRONCHIAL WASHINGS  10/24/2022   Procedure: BRONCHIAL WASHINGS;  Surgeon: Josephine Igo, DO;  Location: [facility];  Service: Pulmonary;;   CATARACT EXTRACTION W/PHACO Right 11/06/2013   Procedure: CATARACT EXTRACTION PHACO AND INTRAOCULAR LENS PLACEMENT ([facility]);  Surgeon: Gemma Payor, MD;  Location[facility];  Service: Ophthalmology;  Laterality: Right;  CDE 11.67   CATARACT EXTRACTION W/PHACO Left 11/25/2015   Procedure: CATARACT EXTRACTION PHACO AND INTRAOCULAR LENS PLACEMENT ; CDE:  8.15;  Surgeon: Gemma Payor, MD;  Location: [facility];  Service: Ophthalmology;  Laterality: Left;   COLONOSCOPY  10/03/2006   [id number] rectal polyps cold biopsied/removed, otherwise normal rectum/Sigmoid diverticula.  Remainder of colon mucosa appeared normal   COLONOSCOPY N/A 08/19/2014   RMR: Radiation proctitis-status post APC ablation. Colonic diverticulosis.   CYSTOSCOPY  02/28/2012   Procedure:  CYSTOSCOPY;  Surgeon: Valetta Fuller, MD;  Location[facility];  Service: Urology;  Laterality: N/A;   no seeds noted in bladder   RADIOACTIVE SEED IMPLANT  02/28/2012   Procedure: RADIOACTIVE SEED IMPLANT;  Surgeon: Valetta Fuller, MD;  Location: [facility];  Service: Urology;  Laterality: N/A;  67seeds implanted     Home Medications:  Allergies as of 03/20/2023   No Known Allergies      Medication List        Accurate as of March 20, 2023  1:48 PM. If you have any questions, ask your nurse or doctor.          acetaminophen 500 MG tablet Commonly known as: TYLENOL Take 2 tablets (1,000 mg total) by mouth every 6 (six) hours as needed. What changed: reasons to take this   amLODipine 2.5 MG tablet Commonly known as: NORVASC Take 2.5 mg by mouth daily.   APPLE CIDER VINEGAR PO Take 450 mg by mouth daily.   b complex vitamins capsule Take 1 capsule by mouth daily.   Bevespi Aerosphere 9-4.8 MCG/ACT Aero Generic drug: Glycopyrrolate-Formoterol Inhale 2 puffs into the lungs 2 (two) times daily.   cyclobenzaprine 5 MG tablet Commonly known as: FLEXERIL Take 1 tablet (5 mg total) by mouth 3 (three) times daily as needed for muscle spasms. Do not drink alcohol or drive while taking this medication.  May cause drowsiness.   escitalopram 5 MG tablet Commonly known as: LEXAPRO Take 5  mg by mouth daily.   metoprolol succinate 25 MG 24 hr tablet Commonly known as: Toprol XL Take 1 tablet (25 mg total) by mouth daily.   milk thistle 175 MG tablet Take 2,000 mg by mouth daily.   nystatin 100000 UNIT/ML suspension Commonly known as: MYCOSTATIN Take 5 mLs (500,000 Units total) by mouth 4 (four) times daily.   olopatadine 0.1 % ophthalmic solution Commonly known as: Pataday Place 1 drop into both eyes 2 (two) times daily.   omeprazole 20 MG capsule Commonly known as: PRILOSEC Take 20 mg by mouth daily.   rivaroxaban 20 MG Tabs  tablet Commonly known as: XARELTO Take 1 tablet (20 mg total) by mouth daily with supper.   simvastatin 40 MG tablet Commonly known as: ZOCOR Take 40 mg by mouth every other day.   solifenacin 10 MG tablet Commonly known as: VESICARE Take 10 mg by mouth daily.   tadalafil 10 MG tablet Commonly known as: Cialis Take 1 tablet (10 mg total) by mouth as needed for erectile dysfunction (30 minutes prior to intercourse).   tamsulosin 0.4 MG Caps capsule Commonly known as: FLOMAX Take 0.4 mg by mouth daily.        Allergies: No Known Allergies  Family History  Problem Relation Age of Onset   Stroke Mother    Hypertension Mother    Colon cancer Sister        in her 69s   Breast cancer Sister    COPD Sister    Hypertension Sister    Pneumonia Sister    Heart attack Brother    COPD Son    COPD Maternal Aunt    Cancer Neg Hx    Liver disease Neg Hx     Social History:  reports that he quit smoking about 32 years ago. His smoking use included cigarettes. He has a 70.00 pack-year smoking history. His smokeless tobacco use includes chew. He reports current alcohol use of about 14.0 standard drinks of alcohol per week. He reports that he does not use drugs.  ROS: A complete review of systems was performed.  All systems are negative except for pertinent findings as noted.  Physical Exam:  Vital signs in last 24 hours: BP 132/83   Pulse 81  Constitutional:  Alert and oriented, No acute distress Cardiovascular: Regular rate  Respiratory: Normal respiratory effort Neurologic: Grossly intact, no focal deficits Psychiatric: Normal mood and affect   I talked the patient anatomical landmarks  Under my guidance, he injected 10 mcg in his right corporal body.  He did have some results although did not feel at this point in the office he was firm enough for penetration.  No pain.   Impression/Assessment:  ED, beginning injections  Plan:  I will have him come back in 6 to 8  weeks to see how he is doing with his injections

## 2023-03-21 ENCOUNTER — Other Ambulatory Visit (HOSPITAL_COMMUNITY): Payer: Medicare Other

## 2023-03-21 ENCOUNTER — Encounter: Payer: Self-pay | Admitting: Adult Health

## 2023-03-21 ENCOUNTER — Other Ambulatory Visit: Payer: Self-pay | Admitting: Radiation Oncology

## 2023-03-21 ENCOUNTER — Ambulatory Visit: Payer: Medicare Other | Admitting: Adult Health

## 2023-03-21 VITALS — BP 130/86 | HR 94 | Temp 98.1°F | Ht 74.0 in | Wt 225.2 lb

## 2023-03-21 DIAGNOSIS — C3490 Malignant neoplasm of unspecified part of unspecified bronchus or lung: Secondary | ICD-10-CM | POA: Insufficient documentation

## 2023-03-21 DIAGNOSIS — G4733 Obstructive sleep apnea (adult) (pediatric): Secondary | ICD-10-CM | POA: Diagnosis not present

## 2023-03-21 DIAGNOSIS — C3412 Malignant neoplasm of upper lobe, left bronchus or lung: Secondary | ICD-10-CM | POA: Diagnosis not present

## 2023-03-21 DIAGNOSIS — I712 Thoracic aortic aneurysm, without rupture, unspecified: Secondary | ICD-10-CM | POA: Diagnosis not present

## 2023-03-21 DIAGNOSIS — J449 Chronic obstructive pulmonary disease, unspecified: Secondary | ICD-10-CM | POA: Diagnosis not present

## 2023-03-21 NOTE — Assessment & Plan Note (Signed)
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  Patient is continue follow-up with cardiology.  Repeat CT scan is planned in 6 months.

## 2023-03-21 NOTE — Progress Notes (Signed)
Reviewed and agree with assessment/plan.   Coralyn Helling, MD [facility] Pulmonary/Critical Care 03/21/2023, 12:29 PM Pager:  [phone]

## 2023-03-21 NOTE — Telephone Encounter (Signed)
Requested Prescriptions  Pending Prescriptions Disp Refills   olopatadine (PATANOL) 0.1 % ophthalmic solution [Pharmacy Med Name: OLOPATADINE HCL 0.1% EYE DROPS] 5 mL 0    Sig: PLACE 1 DROP INTO BOTH EYES TWICE DAILY     There is no refill protocol information for this order

## 2023-03-21 NOTE — Assessment & Plan Note (Signed)
Compensated on present regimen.  No changes   Plan  Patient Instructions  Mucinex DM Twice daily  As needed cough/congestion  Continue on BEVESPI 2 puffs Twice daily  Albuterol inhaler or neb As needed  CT chest in 6 months (follow lung mass) -Radiation Oncology will order.  Continue on CPAP At bedtime   Decrease CPAP 5 to 15 cmH2O.  Order for new CPAP  Do not drive if sleepy .  I recommend the RSV vaccine . Follow up with Dr. Craige Cotta in 4 months and As needed   Please contact office for sooner follow up if symptoms do not improve or worsen or seek emergency care

## 2023-03-21 NOTE — Patient Instructions (Addendum)
Mucinex DM Twice daily  As needed cough/congestion  Continue on BEVESPI 2 puffs Twice daily  Albuterol inhaler or neb As needed  CT chest in 6 months (follow lung mass) -Radiation Oncology will order.  Continue on CPAP At bedtime   Decrease CPAP 5 to 15 cmH2O.  Order for new CPAP  Do not drive if sleepy .  I recommend the RSV vaccine . Follow up with Dr. Craige Cotta in 4 months and As needed   Please contact office for sooner follow up if symptoms do not improve or worsen or seek emergency care

## 2023-03-21 NOTE — Assessment & Plan Note (Signed)
Excellent control compliance on nocturnal CPAP no changes order for new CPAP machine as his machine is getting old.  Has perceived benefit on CPAP with decreased daytime sleepiness. Decrease CPAP pressure for comfort  Plan  Patient Instructions  Mucinex DM Twice daily  As needed cough/congestion  Continue on BEVESPI 2 puffs Twice daily  Albuterol inhaler or neb As needed  CT chest in 6 months (follow lung mass) -Radiation Oncology will order.  Continue on CPAP At bedtime   Decrease CPAP 5 to 15 cmH2O.  Order for new CPAP  Do not drive if sleepy .  I recommend the RSV vaccine . Follow up with Dr. Craige Cotta in 4 months and As needed   Please contact office for sooner follow up if symptoms do not improve or worsen or seek emergency care  '

## 2023-03-21 NOTE — Assessment & Plan Note (Signed)
Presumed stage I lung cancer with hypermetabolic left upper lobe lung nodule-status post SBRT completed in February 2024 (x 5 fractions) -appeared to tolerate radiation well.  Serial CT chest March 16, 2023 shows decreased left upper lobe subpleural pulmonary nodule measuring 1.9 cm previously 2.4 cm.  Stable posttreatment changes in the posterior peripheral left upper lobe.  No thoracic adenopathy.  Radiation oncology will continue to follow with a follow-up CT chest in 6 months.  We discussed his results in detail.  Plan  Patient Instructions  Mucinex DM Twice daily  As needed cough/congestion  Continue on BEVESPI 2 puffs Twice daily  Albuterol inhaler or neb As needed  CT chest in 6 months (follow lung mass) -Radiation Oncology will order.  Continue on CPAP At bedtime   Decrease CPAP 5 to 15 cmH2O.  Order for new CPAP  Do not drive if sleepy .  I recommend the RSV vaccine . Follow up with Dr. Craige Cotta in 4 months and As needed   Please contact office for sooner follow up if symptoms do not improve or worsen or seek emergency care

## 2023-03-21 NOTE — Addendum Note (Signed)
Addended by: Delrae Rend on: 03/21/2023 12:50 PM   Modules accepted: Orders

## 2023-03-21 NOTE — Progress Notes (Signed)
@Patient  ID: Jonathan Greene, male    DOB: Nov 28, 1941, 82 y.o.   MRN: [id number]  Chief Complaint  Patient presents with   Follow-up    Referring provider: Roe Rutherford, NP  HPI: 82 year old male former smoker followed for COPD with emphysema, lung nodularity, hypermetabolic left upper lobe lung nodule -presumed lung cancer s/p SBRT (completed  5 fractions- SBRT 01/29/23) , history of asbestos exposure Medical history significant atrial fibrillation and ascending thoracic aortic aneurysm  TEST/EVENTS :  PFT 06/30/20 >> FEV1 3.50 (99%), FEV1% 77, TLC 7.09 (90%), DLCO 51% Lt upper lobe core needle biopsy 07/01/20 >> benign lung with inflammation and fibrosis   Chest Imaging:  CT of the chest without contrast on 06/03/2020 showed 2.3 x 2.2 x 4.4 cm subpleural mass like consolidation in the left upper lobe corresponding to the density seen on the prior radiograph. -Patient smoked for 33 years, quit smoking at age 54.  He smoked on average of 1 pack/day for most years and at the time of quitting, was smoking 3 packs/day of cigarettes. -He worked as a Psychologist, occupational and was Administrator, sports.  He reports asbestos exposure throughout his career. -He was reportedly diagnosed to have asbestos-related lung disease and was seen by an attorney and his doctor in [facility]. -PET scan on 06/16/2020 shows posterior left upper lobe pleural-based hypermetabolic nodule, 2.4 x 2.3 cm SUV 6.2.  AP window lymph node measures 6 mm with SUV 4.3.  No hypermetabolic extrathoracic metastatic disease. -MRI of the brain on 06/22/2020 shows single suspicious focus of enhancement along the inferolateral margin of the temporal lobe on the left measuring 4 mm, not well seen on sagittal or coronal postcontrast imaging.  Unsure if it is small metastasis or a surface vessel.  No second suspicious focus identified.  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 -Left upper lobe  needle biopsy on 07/01/2020 shows lung tissue with a lymphoplasmacytic infiltrate and organizing pneumonia type changes and fibrosis.  IHC negative for tumor. -PET scan on 08/16/2020 shows subpleural left upper lobe lung nodule measuring 2.2 x 1.2 cm, previously 2.8 x 2.2 cm.  SUV 3.4, previously 6.2.  Previously described hypermetabolic AP window node has resolved. - CT chest on 11/18/2020 showed further signs of regression of the left upper lobe mass.   Echo 06/30/20 >> EF 55 to 60%, mod LVH   CT chest 07/18/21 >> ascending aorta 4.3 cm, atherosclerosis, mod emphysema, new LUL patchy consolidation, plaque like lesion LUL  CT chest 12/06/21 >> 4.4 cm ascending thoracic aorta, severe emphysema, 2.4 x 0.4 cm mass LUL CT chest August 2023 showed a significantly increased size of the pleural-based nodule in the lateral aspect of the left upper lobe measuring 2.1 x 1.5 cm.   PET scan on August 18, 2022 showed enlarging subpleural nodule peripherally in the left upper lobe intensely hypermetabolic consistent with probable malignancy.   navigational bronchoscopy that was done on October 24, 2022. Cytology was nondiagnostic. Cultures were negative. Unfortunately during navigational bronchoscopy several samples were attempted however had associated hemorrhage around the surrounding parenchyma of the lesion  CT chest that was completed on December 14, 2022 that showed stable ascending thoracic aorta measuring at 4.4 cm., Severe emphysema, increased spiculated subpleural solid nodule in the left upper lobe measuring 2.4 x 1.4 cm previously at 1.7 x 1.5 cm.   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  Patient has underlying COPD with emphysema.  He remains on Bevespi twice daily.  Patient says overall he feels his breathing is actually doing pretty good.  He has had no flare of cough or  wheezing.  He does get short of breath with heavy activities but will stop and rest and then start back his activity without any significant change in activity tolerance.  Patient says he feels he is very active for his age.  He continues to hunt on a regular basis.  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  Has a garden at home.  And also goes fishing a lot.  He does have family close by that can check on him.  But he lives at home fully independent.  He denies any increased albuterol use.  Prevnar 20 is up-to-date.  Influenza is up-to-date.  We discussed RSV vaccine.  Patient has known lung nodularity followed on serial CT scan.  CT chest August 2023 showed significantly increased pleural-based nodule in the left upper lobe measuring 2.1 cm.  Subsequent PET scan August 18, 2022 showed enlarging subpleural nodule that was intensely hypermetabolic consistent with probable malignancy.  Patient underwent navigational bronchoscopy that was nondiagnostic.  Procedure was complicated by associated hemorrhage around the surrounding parenchymal lesion.  Patient was felt high risk for further surgical or procedural intervention.  Subsequent serial CT chest December 14, 2022 showed increased subpleural solid nodule in the left upper lobe measuring to 2.4 cm. Case was reviewed at the  [facility]).  Patient was recommended for referral to radiation oncology.  He underwent SBRT x 5 fractions completed January 29, 2023.  Patient says he tolerated radiation very well.  Says he had no skin irritation.  And really has not felt like his energy level has changed much. Most recent CT chest was done March 16, 2023.  That showed mildly decreased left upper lobe subpleural pulmonary nodule measuring 1.9 cm.  Previously 2.4 cm.  Stable separate posttreatment change in the peripheral posterior left upper lobe.  No thoracic adenopathy or findings of metastatic disease  noted. Thoracic aortic aneurysm measuring 4.7 cm.  Previously 4.4 cm. We discussed his results in detail.  Patient will have a follow-up CT chest in 6 months.  Patient has known sleep apnea.  He is on nocturnal CPAP.  Says his machine is getting old.  Needs a new machine.  Patient says he wears his CPAP every single night.  Feels that he benefits from CPAP with decreased daytime sleepiness.  CPAP download shows excellent compliance with 87% usage.  Daily average usage at 7 hours.  Patient is on auto CPAP 5 to 20 cm H2O.  Daily average pressure 11.5 cm H2O.  AHI 2.9/hour.   No Known Allergies  Immunization History  Administered Date(s) Administered   Fluad Quad(high Dose 65+) 09/15/2020, 09/16/2021, 09/01/2022   Influenza, High Dose Seasonal PF 08/18/2019   Moderna Sars-Covid-2 Vaccination 11/19/2020   PNEUMOCOCCAL CONJUGATE-20 01/18/2022   Pneumococcal Conjugate-13 08/10/2015   Tdap 03/24/2022   Zoster Recombinat (Shingrix) 10/18/2015    Past Medical History:  Diagnosis Date   A-fib    Arthritis    right hand   Bradycardia    COPD (chronic obstructive pulmonary disease)    Dyspnea    Dysrhythmia    Frequent urination    GERD (gastroesophageal reflux disease)    Hypercholesterolemia    Hypertension    MI (  myocardial infarction)    Pneumonia    Prostate cancer    RADIATION TX AND SCHEDULED FOR SEED IMPLANTS 02-28-12   Sleep apnea    uses a cpap   Stroke    2011;ST memory loss, no other deficits.   Urinary urgency     Tobacco History: Social History   Tobacco Use  Smoking Status Former   Packs/day: 2.00   Years: 35.00   Additional pack years: 0.00   Total pack years: 70.00   Types: Cigarettes   Quit date: 02/21/1991   Years since quitting: 32.0  Smokeless Tobacco Current   Types: Chew  Tobacco Comments   CHEW TOBACCO FOR 20 YRS    Ready to quit: Not Answered Counseling given: Not Answered Tobacco comments: CHEW TOBACCO FOR 20 YRS    Outpatient Medications  Prior to Visit  Medication Sig Dispense Refill   acetaminophen (TYLENOL) 500 MG tablet Take 2 tablets (1,000 mg total) by mouth every 6 (six) hours as needed. (Patient taking differently: Take 1,000 mg by mouth every 6 (six) hours as needed for mild pain.) 30 tablet 0   amLODipine (NORVASC) 2.5 MG tablet Take 2.5 mg by mouth daily.     APPLE CIDER VINEGAR PO Take 450 mg by mouth daily.     b complex vitamins capsule Take 1 capsule by mouth daily.     escitalopram (LEXAPRO) 5 MG tablet Take 5 mg by mouth daily.     Glycopyrrolate-Formoterol (BEVESPI AEROSPHERE) 9-4.8 MCG/ACT AERO Inhale 2 puffs into the lungs 2 (two) times daily. 10.7 g 5   Magnesium 250 MG TABS Take 1 tablet by mouth daily.     metoprolol succinate (TOPROL XL) 25 MG 24 hr tablet Take 1 tablet (25 mg total) by mouth daily. 30 tablet 11   milk thistle 175 MG tablet Take 2,000 mg by mouth daily.     olopatadine (PATADAY) 0.1 % ophthalmic solution Place 1 drop into both eyes 2 (two) times daily. 5 mL 1   omeprazole (PRILOSEC) 20 MG capsule Take 20 mg by mouth daily.     rivaroxaban (XARELTO) 20 MG TABS tablet Take 1 tablet (20 mg total) by mouth daily with supper. 90 tablet 4   simvastatin (ZOCOR) 40 MG tablet Take 40 mg by mouth every other day.     solifenacin (VESICARE) 10 MG tablet Take 10 mg by mouth daily.      tamsulosin (FLOMAX) 0.4 MG CAPS capsule Take 0.4 mg by mouth daily.     cyclobenzaprine (FLEXERIL) 5 MG tablet Take 1 tablet (5 mg total) by mouth 3 (three) times daily as needed for muscle spasms. Do not drink alcohol or drive while taking this medication.  May cause drowsiness. (Patient not taking: Reported on 03/21/2023) 15 tablet 0   nystatin (MYCOSTATIN) 100000 UNIT/ML suspension Take 5 mLs (500,000 Units total) by mouth 4 (four) times daily. (Patient not taking: Reported on 03/20/2023) 120 mL 0   tadalafil (CIALIS) 10 MG tablet Take 1 tablet (10 mg total) by mouth as needed for erectile dysfunction (30 minutes prior  to intercourse). (Patient not taking: Reported on 03/20/2023) 4 tablet 6   Facility-Administered Medications Prior to Visit  Medication Dose Route Frequency Provider Last Rate Last Admin   albuterol (PROVENTIL) (2.5 MG/3ML) 0.083% nebulizer solution 2.5 mg  2.5 mg Nebulization Once Rafferty Postlewait S, NP         Review of Systems:   Constitutional:   No  weight loss, night sweats,  Fevers,  chills, fatigue, or  lassitude.  HEENT:   No headaches,  Difficulty swallowing,  Tooth/dental problems, or  Sore throat,                No sneezing, itching, ear ache, nasal congestion, post nasal drip,   CV:  No chest pain,  Orthopnea, PND, swelling in lower extremities, anasarca, dizziness, palpitations, syncope.   GI  No heartburn, indigestion, abdominal pain, nausea, vomiting, diarrhea, change in bowel habits, loss of appetite, bloody stools.   Resp:   No excess mucus, no productive cough,  No non-productive cough,  No coughing up of blood.  No change in color of mucus.  No wheezing.  No chest wall deformity  Skin: no rash or lesions.  GU: no dysuria, change in color of urine, no urgency or frequency.  No flank pain, no hematuria   MS:  No joint pain or swelling.  No decreased range of motion.  No back pain.    Physical Exam  BP 130/86 (BP Location: Left Arm, Patient Position: Sitting, Cuff Size: Normal)   Pulse 94   Temp 98.1 F (36.7 C) (Oral)   Ht  (1.88 m)   Wt 225 lb 3.2 oz (102.2 kg)   SpO2 97%   BMI 28.91 kg/m   GEN: A/Ox3; pleasant , NAD, well nourished    HEENT:  [place]/AT,   NOSE-clear, THROAT-clear, no lesions, no postnasal drip or exudate noted.   NECK:  Supple w/ fair ROM; no JVD; normal carotid impulses w/o bruits; no thyromegaly or nodules palpated; no lymphadenopathy.    RESP  Clear  P & A; w/o, wheezes/ rales/ or rhonchi. no accessory muscle use, no dullness to percussion  CARD:  RRR, no m/r/g, no peripheral edema, pulses intact, no cyanosis or clubbing.  GI:    Soft & nt; nml bowel sounds; no organomegaly or masses detected.   Musco: Warm bil, no deformities or joint swelling noted.   Neuro: alert, no focal deficits noted.    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 Restaging. Additional history of prostate cancer. Previously treated posterior left upper lobe non-small cell lung cancer. * Tracking Code: BO * EXAM: CT CHEST WITHOUT CONTRAST TECHNIQUE: Multidetector CT imaging of the chest was performed following the standard protocol without IV contrast. RADIATION DOSE REDUCTION: This exam was performed according to the departmental dose-optimization program which includes automated exposure control, adjustment of the mA and/or kV according to patient size and/or use of iterative reconstruction technique. COMPARISON:  12/14/2022 chest CT. FINDINGS: Cardiovascular: Normal heart size. No significant pericardial effusion/thickening. Three-vessel coronary atherosclerosis. Atherosclerotic thoracic aorta with 4.7 cm ascending thoracic aortic aneurysm. Normal caliber pulmonary arteries. Mediastinum/Nodes: No significant thyroid nodules. Unremarkable esophagus. No pathologically enlarged axillary, mediastinal or hilar lymph nodes, noting limited sensitivity for the detection of hilar adenopathy on this noncontrast study. Lungs/Pleura: No pneumothorax. No pleural effusion. Severe centrilobular and mild paraseptal emphysema with mild diffuse bronchial wall thickening. Solid 0.5 cm peripheral right upper pulmonary nodule (series 4/image 50), previously 0.5 cm using similar measurement technique, stable. A peripheral left upper lobe solid irregular 1.9 x 1.3 cm subpleural pulmonary nodule (series 4/image 58), mildly decreased from 2.4 x 1.4 cm. Stable thin bandlike consolidation in  the peripheral posterior left upper lobe with associated mild distortion  and volume loss compatible with prior post treatment change. No acute consolidative airspace disease or new significant pulmonary nodules. Upper abdomen: Small hiatal hernia. Musculoskeletal: No aggressive appearing focal osseous lesions. Superficial subcutaneous 2.5 cm ventral left chest wall soft tissue density circumscribed lesion (series 2/image 67), stable, most compatible with complex sebaceous cyst. Mild thoracic spondylosis. IMPRESSION: 1. Peripheral left upper lobe solid irregular 1.9 cm subpleural pulmonary nodule, mildly decreased in size since 12/14/2022 chest CT, compatible with treatment response. 2. Stable separate post treatment change in the peripheral posterior left upper lobe. 3. No thoracic adenopathy or other findings of metastatic disease in the chest. 4. Ascending thoracic aortic 4.7 cm aneurysm. Ascending thoracic aortic aneurysm. Recommend semi-annual imaging followup by CTA or MRA and referral to cardiothoracic surgery if not already obtained. This recommendation follows 2010 ACCF/AHA/AATS/ACR/ASA/SCA/SCAI/SIR/STS/SVM Guidelines for the Diagnosis and Management of Patients With Thoracic Aortic Disease. Circulation. 2010; 121: Z610-R604. Aortic aneurysm NOS (ICD10-I71.9). 5. Three-vessel coronary atherosclerosis. 6. Small hiatal hernia. 7. 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 Electronically Signed   By: Delbert Phenix M.D.   On: 03/19/2023 16:53         Latest Ref Rng & Units 06/30/2020   10:08 AM 03/19/2015    9:19 AM  PFT Results  FVC-Pre L 4.45  4.87   FVC-Predicted Pre % 91  93   FVC-Post L 4.57  4.92   FVC-Predicted Post % 94  94   Pre FEV1/FVC % % 76  74   Post FEV1/FCV % % 77  74   FEV1-Pre L 3.40  3.63   FEV1-Predicted Pre % 96  94   FEV1-Post L 3.50  3.66   DLCO uncorrected ml/min/mmHg 14.37  20.98   DLCO UNC% % 51  53   DLVA Predicted % 64  52   TLC L 7.09  7.56    TLC % Predicted % 90  94   RV % Predicted % 79  94     No results found for: "NITRICOXIDE"      Assessment & Plan:   COPD (chronic obstructive pulmonary disease) (HCC) Compensated on present regimen.  No changes   Plan  Patient Instructions  Mucinex DM Twice daily  As needed cough/congestion  Continue on BEVESPI 2 puffs Twice daily  Albuterol inhaler or neb As needed  CT chest in 6 months (follow lung mass) -Radiation Oncology will order.  Continue on CPAP At bedtime   Decrease CPAP 5 to 15 cmH2O.  Order for new CPAP  Do not drive if sleepy .  I recommend the RSV vaccine . Follow up with Dr. Craige Cotta in 4 months and As needed   Please contact office for sooner follow up if symptoms do not improve or worsen or seek emergency care     OSA (obstructive sleep apnea) Excellent control compliance on nocturnal CPAP no changes order for new CPAP machine as his machine is getting old.  Has perceived benefit on CPAP with decreased daytime sleepiness. Decrease CPAP pressure for comfort  Plan  Patient Instructions  Mucinex DM Twice daily  As needed cough/congestion  Continue on BEVESPI 2 puffs Twice daily  Albuterol inhaler or neb As needed  CT chest in 6 months (follow lung mass) -Radiation Oncology will order.  Continue on CPAP At bedtime   Decrease CPAP 5 to 15 cmH2O.  Order for new CPAP  Do not drive if sleepy .  I recommend the RSV vaccine . Follow up with Dr. Craige Cotta in 4 months  and As needed   Please contact office for sooner follow up if symptoms do not improve or worsen or seek emergency care  '   Lung cancer Presumed stage I lung cancer with hypermetabolic left upper lobe lung nodule-status post SBRT completed in February 2024 (x 5 fractions) -appeared to tolerate radiation well.  Serial CT chest March 16, 2023 shows decreased left upper lobe subpleural pulmonary nodule measuring 1.9 cm previously 2.4 cm.  Stable posttreatment changes in the posterior peripheral left upper  lobe.  No thoracic adenopathy.  Radiation oncology will continue to follow with a follow-up CT chest in 6 months.  We discussed his results in detail.  Plan  Patient Instructions  Mucinex DM Twice daily  As needed cough/congestion  Continue on BEVESPI 2 puffs Twice daily  Albuterol inhaler or neb As needed  CT chest in 6 months (follow lung mass) -Radiation Oncology will order.  Continue on CPAP At bedtime   Decrease CPAP 5 to 15 cmH2O.  Order for new CPAP  Do not drive if sleepy .  I recommend the RSV vaccine . Follow up with Dr. Craige Cotta in 4 months and As needed   Please contact office for sooner follow up if symptoms do not improve or worsen or seek emergency care     Thoracic aortic aneurysm Known thoracic aortic aneurysm with slight increase in growth from 4.4 to 4.7 cm.  Patient is continue follow-up with cardiology.  Repeat CT scan is planned in 6 months.    Rubye Oaks, NP 03/21/2023

## 2023-03-22 NOTE — Addendum Note (Signed)
Addended by: Delrae Rend on: 03/22/2023 01:42 PM   Modules accepted: Orders

## 2023-03-26 ENCOUNTER — Telehealth: Payer: Self-pay

## 2023-03-26 NOTE — Telephone Encounter (Signed)
Patient stopped by the office and advised that he misplaced hi syringes for his ED medication. He wanted to know if additional syringes could be called into [place].

## 2023-03-26 NOTE — Telephone Encounter (Signed)
Patient called in making clinic staff aware that he found his syringes for ED medication and no refilled is needed. Patient voiced understanding

## 2023-03-29 ENCOUNTER — Ambulatory Visit: Payer: Medicare Other | Admitting: Radiation Oncology

## 2023-03-29 ENCOUNTER — Ambulatory Visit: Payer: Medicare Other

## 2023-04-10 ENCOUNTER — Telehealth: Payer: Self-pay

## 2023-04-10 DIAGNOSIS — N5201 Erectile dysfunction due to arterial insufficiency: Secondary | ICD-10-CM

## 2023-04-10 DIAGNOSIS — Z8546 Personal history of malignant neoplasm of prostate: Secondary | ICD-10-CM

## 2023-04-10 NOTE — Telephone Encounter (Signed)
Patient called requesting an increase on his Trimix. He advised it is "not helping" with his Erectile Dysfunction. He advised he would be able to pick up the medication at the pharmacy below.    Pharmacy: [facility]  Thank you

## 2023-04-11 MED ORDER — PROSTAGLANDIN E1 POWD
30.0000 ug | Freq: Every day | 5 refills | Status: DC | PRN
Start: 2023-04-11 — End: 2023-04-12

## 2023-04-11 NOTE — Telephone Encounter (Signed)
Return call to patient. Patient is aware of Dr. Retta Diones recommendation "prostaglandin E1, 30 mcg doses, use 1 as needed, #5 doses, as needed refills" Patient voiced understanding

## 2023-04-11 NOTE — Telephone Encounter (Signed)
Please see attached

## 2023-04-12 ENCOUNTER — Telehealth: Payer: Self-pay

## 2023-04-12 ENCOUNTER — Other Ambulatory Visit: Payer: Self-pay

## 2023-04-12 DIAGNOSIS — Z8546 Personal history of malignant neoplasm of prostate: Secondary | ICD-10-CM

## 2023-04-12 DIAGNOSIS — N5201 Erectile dysfunction due to arterial insufficiency: Secondary | ICD-10-CM

## 2023-04-12 MED ORDER — PROSTAGLANDIN E1 POWD
30.0000 ug | Freq: Every day | 5 refills | Status: DC | PRN
Start: 2023-04-12 — End: 2024-03-04

## 2023-04-12 NOTE — Telephone Encounter (Signed)
Return call to patient about medication. Patient states that his pharmacy do not have compound medication and will need to be sent to [facility]. Patient is aware that Rx has [facility]. Patient voiced understanding

## 2023-05-08 ENCOUNTER — Ambulatory Visit: Payer: Medicare Other | Admitting: Urology

## 2023-05-08 ENCOUNTER — Encounter: Payer: Self-pay | Admitting: Urology

## 2023-05-08 VITALS — BP 125/84 | HR 84

## 2023-05-08 DIAGNOSIS — N5201 Erectile dysfunction due to arterial insufficiency: Secondary | ICD-10-CM | POA: Diagnosis not present

## 2023-05-08 DIAGNOSIS — Z8546 Personal history of malignant neoplasm of prostate: Secondary | ICD-10-CM

## 2023-05-08 LAB — URINALYSIS, ROUTINE W REFLEX MICROSCOPIC
Bilirubin, UA: NEGATIVE
Glucose, UA: NEGATIVE
Leukocytes,UA: NEGATIVE
Nitrite, UA: NEGATIVE
Protein,UA: NEGATIVE
RBC, UA: NEGATIVE
Specific Gravity, UA: 1.015 (ref 1.005–1.030)
Urobilinogen, Ur: 2 mg/dL — ABNORMAL HIGH (ref 0.2–1.0)
pH, UA: 7 (ref 5.0–7.5)

## 2023-05-08 NOTE — Progress Notes (Signed)
History of Present Illness: Here for f/u of ED---recently started IC injections  Past urologic history is significant for prostate cancer, treated with I-125 brachytherapy in 2013. He has PSA followed by his primary care provider, Luber[facility].   6.4.2024: He is here today for follow-up.  Unfortunately, 10 mcg injections did not work adequately.  He was increased to 30 mcg.  Still, this does not quite do it.  He does not have pain with injections.    Past Medical History:  Diagnosis Date   A-fib [facility])    Arthritis    right hand   Bradycardia    COPD (chronic obstructive pulmonary disease) (HCC)    Dyspnea    Dysrhythmia    Frequent urination    GERD (gastroesophageal reflux disease)    Hypercholesterolemia    Hypertension    MI (myocardial infarction) (HCC)    Pneumonia    Prostate cancer (HCC)    RADIATION TX AND SCHEDULED FOR SEED IMPLANTS 02-28-12   Sleep apnea    uses a cpap   Stroke (HCC)    2011;ST memory loss, no other deficits.   Urinary urgency     Past Surgical History:  Procedure Laterality Date   BRONCHIAL BIOPSY  10/24/2022   Procedure: BRONCHIAL BIOPSIES;  Surgeon: Josephine Igo, DO;  Location: [facility];  Service: Pulmonary;;   BRONCHIAL NEEDLE ASPIRATION BIOPSY  10/24/2022   Procedure: BRONCHIAL NEEDLE ASPIRATION BIOPSIES;  Surgeon: Josephine Igo, DO;  Location: [facility];  Service: Pulmonary;;   BRONCHIAL WASHINGS  10/24/2022   Procedure: BRONCHIAL WASHINGS;  Surgeon: Josephine Igo, DO;  Location: [facility];  Service: Pulmonary;;   CATARACT EXTRACTION W/PHACO Right 11/06/2013   Procedure: CATARACT EXTRACTION PHACO AND INTRAOCULAR LENS PLACEMENT (IOC);  Surgeon: Gemma Payor, MD;  Location: [facility];  Service: Ophthalmology;  Laterality: Right;  CDE 11.67   CATARACT EXTRACTION W/PHACO Left 11/25/2015   Procedure: CATARACT EXTRACTION PHACO AND INTRAOCULAR LENS PLACEMENT ; CDE:  8.15;  Surgeon: Gemma Payor, MD;  Location: [facility];  Service:  Ophthalmology;  Laterality: Left;   COLONOSCOPY  10/03/2006   WUJ:[id number] rectal polyps cold biopsied/removed, otherwise normal rectum/Sigmoid diverticula.  Remainder of colon mucosa appeared normal   COLONOSCOPY N/A 08/19/2014   RMR: Radiation proctitis-status post APC ablation. Colonic diverticulosis.   CYSTOSCOPY  02/28/2012   Procedure: CYSTOSCOPY;  Surgeon: Valetta Fuller, MD;  Location: [facility];  Service: Urology;  Laterality: N/A;   no seeds noted in bladder   RADIOACTIVE SEED IMPLANT  02/28/2012   Procedure: RADIOACTIVE SEED IMPLANT;  Surgeon: Valetta Fuller, MD;  Location: [facility];  Service: Urology;  Laterality: N/A;  67seeds implanted     Home Medications:  Allergies as of 05/08/2023   No Known Allergies      Medication List        Accurate as of May 08, 2023  8:18 AM. If you have any questions, ask your nurse or doctor.          acetaminophen 500 MG tablet Commonly known as: TYLENOL Take 2 tablets (1,000 mg total) by mouth every 6 (six) hours as needed. What changed: reasons to take this   amLODipine 2.5 MG tablet Commonly known as: NORVASC Take 2.5 mg by mouth daily.   APPLE CIDER VINEGAR PO Take 450 mg by mouth daily.   b complex vitamins capsule Take 1 capsule by mouth daily.   Bevespi Aerosphere 9-4.8 MCG/ACT Aero Generic drug: Glycopyrrolate-Formoterol Inhale 2  puffs into the lungs 2 (two) times daily.   escitalopram 5 MG tablet Commonly known as: LEXAPRO Take 5 mg by mouth daily.   Magnesium 250 MG Tabs Take 1 tablet by mouth daily.   metoprolol succinate 25 MG 24 hr tablet Commonly known as: Toprol XL Take 1 tablet (25 mg total) by mouth daily.   milk thistle 175 MG tablet Take 2,000 mg by mouth daily.   olopatadine 0.1 % ophthalmic solution Commonly known as: Pataday Place 1 drop into both eyes 2 (two) times daily.   omeprazole 20 MG capsule Commonly known as: PRILOSEC Take 20 mg by mouth  daily.   Prostaglandin E1 Powd Inject 30 mcg as directed daily as needed.   rivaroxaban 20 MG Tabs tablet Commonly known as: XARELTO Take 1 tablet (20 mg total) by mouth daily with supper.   simvastatin 40 MG tablet Commonly known as: ZOCOR Take 40 mg by mouth every other day.   solifenacin 10 MG tablet Commonly known as: VESICARE Take 10 mg by mouth daily.   tamsulosin 0.4 MG Caps capsule Commonly known as: FLOMAX Take 0.4 mg by mouth daily.        Allergies: No Known Allergies  Family History  Problem Relation Age of Onset   Stroke Mother    Hypertension Mother    Colon cancer Sister        in her 53s   Breast cancer Sister    COPD Sister    Hypertension Sister    Pneumonia Sister    Heart attack Brother    COPD Son    COPD Maternal Aunt    Cancer Neg Hx    Liver disease Neg Hx     Social History:  reports that he quit smoking about 32 years ago. His smoking use included cigarettes. He has a 70.00 pack-year smoking history. His smokeless tobacco use includes chew. He reports current alcohol use of about 14.0 standard drinks of alcohol per week. He reports that he does not use drugs.  ROS: A complete review of systems was performed.  All systems are negative except for pertinent findings as noted.  Physical Exam:  Vital signs in last 24 hours: There were no vitals taken for this visit. Constitutional:  Alert and oriented, No acute distress Cardiovascular: Regular rate  Respiratory: Normal respiratory effort Neurologic: Grossly intact, no focal deficits Psychiatric: Normal mood and affect  I have reviewed prior pt notes    Impression/Assessment:  ED, on injections, still not there with adequate response yet despite 30 mcg of prostaglandin  Plan:  I recommended that he use his left over 10 as well as 30 mcg vials to administer 40 mcg at a time-he will have enough for 2 tries at this  Additionally, use a constriction band  He will let us know how he  does with that.  If still an adequate, we will try triple mix  I will see back in 3 months

## 2023-05-15 ENCOUNTER — Telehealth: Payer: Self-pay

## 2023-05-15 DIAGNOSIS — N5201 Erectile dysfunction due to arterial insufficiency: Secondary | ICD-10-CM

## 2023-05-15 DIAGNOSIS — Z8546 Personal history of malignant neoplasm of prostate: Secondary | ICD-10-CM

## 2023-05-15 NOTE — Telephone Encounter (Signed)
Patient called requesting an increase in the below medication. He advised at the last visit it was suggested to increase the medication to two injections. He requested a refill of 60 instead of 30 be sent to the below pharmacy.    Medication: prostaglandin E1   Pharmacy: [facility] - [place], [place] - [address]     Thank you

## 2023-05-22 MED ORDER — AMBULATORY NON FORMULARY MEDICATION
11 refills | Status: DC
Start: 2023-05-22 — End: 2024-03-04

## 2023-05-22 NOTE — Telephone Encounter (Signed)
Called patient and left detailed message making patient aware of Dr. Retta Diones recommendation.  "These let patient know that higher doses of the prostaglandin will not work.  We will need to send in a prescription for Trimix-please call custom care with the following"  Pge20/pap30/phe1 Inject 0.5-1 mL prn # 5  1 mL vials  11 rf

## 2023-06-04 ENCOUNTER — Ambulatory Visit (HOSPITAL_BASED_OUTPATIENT_CLINIC_OR_DEPARTMENT_OTHER): Payer: Medicare Other

## 2023-06-04 ENCOUNTER — Telehealth: Payer: Self-pay | Admitting: Adult Health

## 2023-06-04 ENCOUNTER — Ambulatory Visit (HOSPITAL_BASED_OUTPATIENT_CLINIC_OR_DEPARTMENT_OTHER): Payer: Medicare Other | Admitting: Pulmonary Disease

## 2023-06-04 ENCOUNTER — Encounter (HOSPITAL_BASED_OUTPATIENT_CLINIC_OR_DEPARTMENT_OTHER): Payer: Self-pay | Admitting: Pulmonary Disease

## 2023-06-04 VITALS — BP 136/88 | HR 91 | Temp 98.0°F | Ht 74.0 in | Wt 212.4 lb

## 2023-06-04 DIAGNOSIS — R042 Hemoptysis: Secondary | ICD-10-CM | POA: Diagnosis not present

## 2023-06-04 DIAGNOSIS — G4733 Obstructive sleep apnea (adult) (pediatric): Secondary | ICD-10-CM | POA: Diagnosis not present

## 2023-06-04 DIAGNOSIS — R634 Abnormal weight loss: Secondary | ICD-10-CM | POA: Diagnosis not present

## 2023-06-04 MED ORDER — AMOXICILLIN-POT CLAVULANATE 875-125 MG PO TABS
1.0000 | ORAL_TABLET | Freq: Two times a day (BID) | ORAL | 0 refills | Status: DC
Start: 2023-06-04 — End: 2024-03-04

## 2023-06-04 NOTE — Telephone Encounter (Signed)
Called and spoke with patient. He stated that he has bee coughing up small amounts of bright red blood for the past week. His cough originally started 2 weeks ago with just clear phlegm. Since last Monday he has been seeing bright red streaks in his phlegm. His SOB and wheezing have remained stable. Denied any fevers or body aches.   He confirmed that he is still using the Bevespi twice daily as well as the albuterol PRN. Also confirmed that he is still on Xarelto.   Pharmacy is [facility].   I did look to see if we had any appts today but we do not.   TP, can you please advise? Thanks!

## 2023-06-04 NOTE — Telephone Encounter (Signed)
Patient states having symptom of cough with blood. Pharmacy is [facility]. Patient phone number is [phone] and [phone].

## 2023-06-04 NOTE — Telephone Encounter (Signed)
Patient was scheduled with Dr.Sood at 2:45pm today.  Patient's voice was understanding.   Northing else further needed.

## 2023-06-04 NOTE — Telephone Encounter (Signed)
This is a Dr. Craige Cotta  patient , he has several openings today at [facility] office , see if patient can go to see him for sick visit .

## 2023-06-04 NOTE — Progress Notes (Signed)
[facility]  Chief Complaint  Patient presents with   Follow-up    Patient stated he has been coughing up a lot of mucus and he has seen some blood in it. Patient stated it is worse in the mornings.     Constitutional:  BP 136/88 (BP Location: Left Arm, Patient Position: Sitting, Cuff Size: Normal)   Pulse 91   Temp 98 F (36.7 C) (Oral)   Ht 6\' 2"  (1.88 m)   Wt 212 lb 6.4 oz (96.3 kg)   SpO2 97%   BMI 27.27 kg/m   Past Medical History:  A fib, GERD, HLD, HTN, Prostate cancer  Past Surgical History:  His  has a past surgical history that includes Radioactive seed implant (02/28/2012); Cystoscopy (02/28/2012); Cataract extraction w/PHACO (Right, 11/06/2013); Colonoscopy (10/03/2006); Colonoscopy (N/A, 08/19/2014); Cataract extraction w/PHACO (Left, 11/25/2015); Bronchial needle aspiration biopsy (10/24/2022); Bronchial biopsy (10/24/2022); and Bronchial washings (10/24/2022).  Brief Summary:  Jonathan Greene is a 82 y.o. 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      Subjective:   He has a persistent cough with sputum.  For the past week his cough has more of a color and now coughing up blood mixed in the phlegm.  No fever, sinus pressure, nose bleeds, sore throat, chest pain, or wheezing.  No sick exposures.  He takes xarelto for a fib and last dose was in the evening on 06/03/23.    He has been struggling to use CPAP.  Feels like he sleeps better without it.    Physical Exam:   Appearance - well kempt   ENMT - no sinus tenderness, no oral exudate, no LAN, Mallampati 3 airway, no stridor, wears dentures, deviated nasal septum  Respiratory - decreased breath sounds bilaterally, no wheezing or rales  CV - s1s2 regular rate and rhythm, no murmurs  Ext - no clubbing, no edema  Skin - no rashes  Psych - normal mood and affect        Pulmonary testing:  PFT 06/30/20 >> FEV1 3.50 (99%), FEV1% 77, TLC 7.09 (90%), DLCO 51% Lt upper lobe core needle biopsy 07/01/20 >> benign lung with inflammation and fibrosis  Chest Imaging:  CT chest 06/03/20 >> atherosclerosis, aortic root 4.3 cm, severe centrilobular emphysema, LUL subpleural mass like consolidation 4.4 x 2.3 x 2.2 cm  PET scan 06/16/20 >> 2.4 x 2.3 cm pleural based LUL nodule 6.2 SUV, 6 mm AP window node 4.2 SUV, ascending aorta 4.1 cm, centrilobular and paraseptal emphysema PET scan 08/16/20 >> decreased size and SUV of LUL 2.2 x 1.2 cm mass, AP window LAN resolved CT chest 07/18/21 >> ascending aorta 4.3 cm, atherosclerosis, mod emphysema, new LUL patchy consolidation, plaque like lesion LUL CT chest 12/06/21 >> 4.4 cm ascending thoracic aorta, severe emphysema, 2.4 x 0.4 cm mass LUL CT chest 03/19/23 >> ascending aorta 4.7 cm, severe emphysema, bronchial wall thickening, 5 mm RUL nodule stable, 1.9 x 1.3 cm LUL nodule (was 2.4 x 1.4 cm), post XRT changes LUL  Sleep Tests:  Auto CPAP 04/14/23 to 05/13/23 >> used on 18 of 30 nights with average 5 hrs 58 min.  Average AHI 1.9 with median CPAP 7 and 95 th percentile CPAP 9 cm H2O  Cardiac Tests:  Echo 06/30/20 >> EF 55 to 60%, mod LVH  Social History:  He  reports that he quit smoking about  32 years ago. His smoking use included cigarettes. He has a 70.00 pack-year smoking history. His smokeless tobacco use includes chew. He reports current alcohol use of about 14.0 standard drinks of alcohol per week. He reports that he does not use drugs.  Family History:  His family history includes Breast cancer in his sister; COPD in his maternal aunt, sister, and son; Colon cancer in his sister; Heart attack in his brother; Hypertension in his mother and sister; Pneumonia in his sister; Stroke in his mother.     Assessment/Plan:   Hemoptysis with acute bronchitis. - chest xray today - will give him a course of augmentin - will have  him hold xarelto until 06/06/23  Lt upper lobe mass like consolidation seen on CT chest 06/03/20. 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 - continue bevespi - prn albuterol   Obstructive sleep apnea. - he would like to see if he still needs CPAP - will arrange for a home sleep study and then determine if he should resume using CPAP   History of asbestos exposure. - worked as a Radiation protection practitioner fibrillation, ascending thoracic aortic aneurysm. - followed by Dr. Wyline Mood with cardiology  Time Spent Involved in Patient Care on Day of Examination:  36 minutes  Follow up:   Patient Instructions  Chest xray today  Augmentin antibiotic one pill twice per day for 5 days  Don't take xarelto again until June 06, 2023  Will arrange for a home sleep study  Follow up in 4 months  Medication List:   Allergies as of 06/04/2023   No Known Allergies      Medication List        Accurate as of June 04, 2023  3:14 PM. If you have any questions, ask your nurse or doctor.          acetaminophen 500 MG tablet Commonly known as: TYLENOL Take 2 tablets (1,000 mg total) by mouth every 6 (six) hours as needed.   AMBULATORY NON FORMULARY MEDICATION Medication Name: pge20/pap30/phe1 inject 0.5-58ml PRN   amLODipine 2.5 MG tablet Commonly known as: NORVASC Take 2.5 mg by mouth daily.   amoxicillin-clavulanate 875-125 MG tablet Commonly known as: AUGMENTIN Take 1 tablet by mouth 2 (two) times daily. Started by: Coralyn Helling, MD   APPLE CIDER VINEGAR PO Take 450 mg by mouth daily.   b complex vitamins capsule Take 1 capsule by mouth daily.   Bevespi Aerosphere 9-4.8 MCG/ACT Aero Generic drug: Glycopyrrolate-Formoterol Inhale 2 puffs into the lungs 2 (two) times daily.   escitalopram 5 MG tablet Commonly known as: LEXAPRO Take 5 mg by mouth daily.   Magnesium 250 MG Tabs Take 1  tablet by mouth daily.   metoprolol succinate 25 MG 24 hr tablet Commonly known as: Toprol XL Take 1 tablet (25 mg total) by mouth daily.   milk thistle 175 MG tablet Take 2,000 mg by mouth daily.   olopatadine 0.1 % ophthalmic solution Commonly known as: Pataday Place 1 drop into both eyes 2 (two) times daily.   omeprazole 20 MG capsule Commonly known as: PRILOSEC Take 20 mg by mouth daily.   Prostaglandin E1 Powd Inject 30 mcg as directed daily as needed.   rivaroxaban 20 MG Tabs tablet Commonly known as: XARELTO Take 1 tablet (20 mg total) by mouth daily with supper.   simvastatin 40 MG tablet Commonly known as: ZOCOR Take  40 mg by mouth every other day.   solifenacin 10 MG tablet Commonly known as: VESICARE Take 10 mg by mouth daily.   tamsulosin 0.4 MG Caps capsule Commonly known as: FLOMAX Take 0.4 mg by mouth daily.        Signature:  Coralyn Helling, MD [facility] Pulmonary/Critical Care Pager - [phone] 06/04/2023, 3:14 PM

## 2023-06-04 NOTE — Patient Instructions (Signed)
Chest xray today  Augmentin antibiotic one pill twice per day for 5 days  Don't take xarelto again until June 06, 2023  Will arrange for a home sleep study  Follow up in 4 months

## 2023-06-13 ENCOUNTER — Encounter (HOSPITAL_BASED_OUTPATIENT_CLINIC_OR_DEPARTMENT_OTHER): Payer: Self-pay | Admitting: Pulmonary Disease

## 2023-06-15 NOTE — Telephone Encounter (Signed)
Please send refill for augmentin 875 one pill bid for 7 days.  Please schedule an appointment with me or one of the NPs within the next two weeks to determine if he needs to have a bronchoscopy done.

## 2023-06-19 NOTE — Telephone Encounter (Signed)
Patient has been scheduled for f/u with TP 7/31

## 2023-06-20 ENCOUNTER — Encounter: Payer: Self-pay | Admitting: Cardiology

## 2023-06-20 ENCOUNTER — Ambulatory Visit: Payer: Medicare Other | Attending: Cardiology | Admitting: Cardiology

## 2023-06-20 VITALS — BP 144/90 | HR 75 | Ht 74.0 in | Wt 214.8 lb

## 2023-06-20 DIAGNOSIS — I4891 Unspecified atrial fibrillation: Secondary | ICD-10-CM | POA: Diagnosis not present

## 2023-06-20 DIAGNOSIS — I7121 Aneurysm of the ascending aorta, without rupture: Secondary | ICD-10-CM

## 2023-06-20 DIAGNOSIS — I10 Essential (primary) hypertension: Secondary | ICD-10-CM | POA: Diagnosis not present

## 2023-06-20 DIAGNOSIS — I2510 Atherosclerotic heart disease of native coronary artery without angina pectoris: Secondary | ICD-10-CM

## 2023-06-20 NOTE — Patient Instructions (Signed)

## 2023-06-20 NOTE — Progress Notes (Signed)
Clinical Summary Jonathan Greene is a 82 y.o.maleseen today for follow up of the following medical problems.        1. CAD - noted on prior chest CT - 02/2021 had coronary CTA showed 3 vessel CAD without signs of significant stenosis by FFR - evidence of apical aneurysm suggesting possible distal vessel infarct.  - no symptoms - he is on xarelto for afib, not on ASA. On statin, beta blocker.    - no chest pains, ongogni SOB with COPD. Has had some cough with hemoptysis.    2. 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 Hyperlipidemia - on simva 40mg   -09/2022 TC 140 TG 98 HDL 51 LDL 71     6. Afib - denies any palpitations.  - compliant with meds. No bleeding on xarelto - had some questions about possible afib ablation today, had a friend who had done recently and did very well.   - no recent palpitations    7.Hemoptysis - seen by Dr Craige Cotta, from 06/04/23 note plan for course of augmentin and hold xarelto for 48 hrs -ongoing hemoptysis though improving    8.Emphysema - followed by pulmonary   9.OSA - followed by Dr Craige Cotta Past Medical History:  Diagnosis Date   A-fib [facility])    Arthritis    right hand   Bradycardia    COPD (chronic obstructive pulmonary disease) (HCC)    Dyspnea    Dysrhythmia    Frequent urination    GERD (gastroesophageal reflux disease)    Hypercholesterolemia    Hypertension    MI (myocardial infarction) (HCC)    Pneumonia    Prostate cancer (HCC)    RADIATION TX AND SCHEDULED FOR SEED IMPLANTS 02-28-12   Sleep apnea    uses a cpap    Stroke (HCC)    2011;ST memory loss, no other deficits.   Urinary urgency      No Known Allergies   Current Outpatient Medications  Medication Sig Dispense Refill   acetaminophen (TYLENOL) 500 MG tablet Take 2 tablets (1,000 mg total) by mouth every 6 (six) hours as needed. 30 tablet 0   Alprostadil (PROSTAGLANDIN E1) POWD Inject 30 mcg as directed daily as needed. 1 g 5   AMBULATORY NON FORMULARY MEDICATION Medication Name: pge20/pap30/phe1 inject 0.5-80ml PRN 5 Doses/Fill 11   amLODipine (NORVASC) 2.5 MG tablet Take 2.5 mg by mouth daily.     amoxicillin-clavulanate (AUGMENTIN) 875-125 MG tablet Take 1 tablet by mouth 2 (two) times daily. 10 tablet 0   APPLE CIDER VINEGAR PO Take 450 mg by mouth daily.     b complex vitamins capsule Take 1 capsule by mouth daily.     escitalopram (LEXAPRO) 5 MG tablet Take 5 mg by mouth daily.     Glycopyrrolate-Formoterol (BEVESPI AEROSPHERE) 9-4.8 MCG/ACT AERO Inhale 2 puffs into the lungs 2 (two) times daily. 10.7 g 5   Magnesium 250 MG TABS Take 1 tablet by mouth daily.     metoprolol succinate (TOPROL XL) 25 MG 24  hr tablet Take 1 tablet (25 mg total) by mouth daily. 30 tablet 11   milk thistle 175 MG tablet Take 2,000 mg by mouth daily.     olopatadine (PATADAY) 0.1 % ophthalmic solution Place 1 drop into both eyes 2 (two) times daily. 5 mL 1   omeprazole (PRILOSEC) 20 MG capsule Take 20 mg by mouth daily.     rivaroxaban (XARELTO) 20 MG TABS tablet Take 1 tablet (20 mg total) by mouth daily with supper. 90 tablet 4   simvastatin (ZOCOR) 40 MG tablet Take 40 mg by mouth every other day.     solifenacin (VESICARE) 10 MG tablet Take 10 mg by mouth daily.      tamsulosin (FLOMAX) 0.4 MG CAPS capsule Take 0.4 mg by mouth daily.     Current Facility-Administered Medications  Medication Dose Route Frequency Provider Last Rate Last Admin   albuterol (PROVENTIL) (2.5 MG/3ML) 0.083% nebulizer solution 2.5 mg  2.5 mg Nebulization Once Parrett, Virgel Bouquet,  NP         Past Surgical History:  Procedure Laterality Date   BRONCHIAL BIOPSY  10/24/2022   Procedure: BRONCHIAL BIOPSIES;  Surgeon: Josephine Igo, DO;  Location: [facility];  Service: Pulmonary;;   BRONCHIAL NEEDLE ASPIRATION BIOPSY  10/24/2022   Procedure: BRONCHIAL NEEDLE ASPIRATION BIOPSIES;  Surgeon: Josephine Igo, DO;  Location: [facility];  Service: Pulmonary;;   BRONCHIAL WASHINGS  10/24/2022   Procedure: BRONCHIAL WASHINGS;  Surgeon: Josephine Igo, DO;  Location: [facility];  Service: Pulmonary;;   CATARACT EXTRACTION W/PHACO Right 11/06/2013   Procedure: CATARACT EXTRACTION PHACO AND INTRAOCULAR LENS PLACEMENT (IOC);  Surgeon: Gemma Payor, MD;  Location: [facility];  Service: Ophthalmology;  Laterality: Right;  CDE 11.67   CATARACT EXTRACTION W/PHACO Left 11/25/2015   Procedure: CATARACT EXTRACTION PHACO AND INTRAOCULAR LENS PLACEMENT ; CDE:  8.15;  Surgeon: Gemma Payor, MD;  Location: [facility];  Service: Ophthalmology;  Laterality: Left;   COLONOSCOPY  10/03/2006   HYQ:MVHQIONGEX rectal polyps cold biopsied/removed, otherwise normal rectum/Sigmoid diverticula.  Remainder of colon mucosa appeared normal   COLONOSCOPY N/A 08/19/2014   RMR: Radiation proctitis-status post APC ablation. Colonic diverticulosis.   CYSTOSCOPY  02/28/2012   Procedure: CYSTOSCOPY;  Surgeon: Valetta Fuller, MD;  Location: [facility];  Service: Urology;  Laterality: N/A;   no seeds noted in bladder   RADIOACTIVE SEED IMPLANT  02/28/2012   Procedure: RADIOACTIVE SEED IMPLANT;  Surgeon: Valetta Fuller, MD;  Location: [facility];  Service: Urology;  Laterality: N/A;  67seeds implanted      No Known Allergies    Family History  Problem Relation Age of Onset   Stroke Mother    Hypertension Mother    Colon cancer Sister        in her 26s   Breast cancer Sister    COPD Sister    Hypertension Sister    Pneumonia Sister    Heart attack Brother    COPD Son    COPD  Maternal Aunt    Cancer Neg Hx    Liver disease Neg Hx      Social History Jonathan Greene reports that he quit smoking about 32 years ago. His smoking use included cigarettes. He started smoking about 67 years ago. He has a 70 pack-year smoking history. His smokeless tobacco use includes chew. Jonathan Greene reports current alcohol use of about 14.0 standard drinks of alcohol per week.   Review of Systems CONSTITUTIONAL:  No weight loss, fever, chills, weakness or fatigue.  HEENT: Eyes: No visual loss, blurred vision, double vision or yellow sclerae.No hearing loss, sneezing, congestion, runny nose or sore throat.  SKIN: No rash or itching.  CARDIOVASCULAR: per hpi RESPIRATORY: No shortness of breath, cough or sputum.  GASTROINTESTINAL: No anorexia, nausea, vomiting or diarrhea. No abdominal pain or blood.  GENITOURINARY: No burning on urination, no polyuria NEUROLOGICAL: No headache, dizziness, syncope, paralysis, ataxia, numbness or tingling in the extremities. No change in bowel or bladder control.  MUSCULOSKELETAL: No muscle, back pain, joint pain or stiffness.  LYMPHATICS: No enlarged nodes. No history of splenectomy.  PSYCHIATRIC: No history of depression or anxiety.  ENDOCRINOLOGIC: No reports of sweating, cold or heat intolerance. No polyuria or polydipsia.  Marland Kitchen   Physical Examination Today's Vitals   06/20/23 1349 06/20/23 1425  BP: (!) 140/90 (!) 144/90  Pulse: 75   SpO2: 97%   Weight: 214 lb 12.8 oz (97.4 kg)   Height: 6\' 2"  (1.88 m)    Body mass index is 27.58 kg/m.  Gen: resting comfortably, no acute distress HEENT: no scleral icterus, pupils equal round and reactive, no palptable cervical adenopathy,  CV: RRR, no mrg, no jvd Resp: Clear to auscultation bilaterally GI: abdomen is soft, non-tender, non-distended, normal bowel sounds, no hepatosplenomegaly MSK: extremities are warm, no edema.  Skin: warm, no rash Neuro:  no focal deficits Psych: appropriate  affect   Diagnostic Studies 02/2015 Echo Study Conclusions  - Left ventricle: The cavity size was normal. Wall thickness was   increased in a pattern of mild LVH. Systolic function was normal.   The estimated ejection fraction was in the range of 50% to 55%.   Wall motion was normal; there were no regional wall motion   abnormalities. Doppler parameters are consistent with abnormal   left ventricular relaxation (grade 1 diastolic dysfunction). - Aortic valve: Mildly calcified annulus. Trileaflet; mildly   thickened leaflets. There was mild regurgitation. Valve area   (VTI): 3.12 cm^2. Valve area (Vmax): 3.15 cm^2. - Aortic root: The visualized portion of the proximal ascending   aorta is mildly dilated at 4.0 cm. - Mitral valve: Mildly calcified annulus. Mildly thickened leaflets   . - Systemic veins: IVC appears small, suggestive of low RA pressures   and hypovolemia. - Technically adequate study.   02/2015 Event monitor Mild sinus brady, no tachyarrhythmias   03/2015 PFTs Decreased DLCO   04/2017 24 hr holter Telemetry tracings show primarily sinus rhythm Min HR 39, Max HR 87, Avg HR 56 Rare ventricular ectopy, primarily in the form of isolated PVCs. One 3 beat run of NSVT. Rare supraventricular ectopy, all in the form of isolated PACs. Some of the PACs are followed by a compensatory pause No symptoms reported    Assessment and Plan  1. CAD - coronary CTA as reported above, 3 vessel disease not significant by FFR - no recent symptoms, continue current meds   2. Aortic aneurysm - moderate by most recent CT - regular chest CTs by pulmonary, contintue to follow along.    3. HTN - elevated here but home numbers at goal, continue current meds   4. Afib/acquried thrombophlia - no recent symptoms, continue current meds - hemoptysis resolving with antibiotic, continue xarelto for now.         Antoine Poche, M.D.

## 2023-06-27 ENCOUNTER — Encounter (INDEPENDENT_AMBULATORY_CARE_PROVIDER_SITE_OTHER): Payer: Medicare Other

## 2023-06-27 ENCOUNTER — Telehealth: Payer: Self-pay | Admitting: Pulmonary Disease

## 2023-06-27 DIAGNOSIS — G4733 Obstructive sleep apnea (adult) (pediatric): Secondary | ICD-10-CM | POA: Diagnosis not present

## 2023-06-27 DIAGNOSIS — R634 Abnormal weight loss: Secondary | ICD-10-CM

## 2023-06-27 NOTE — Telephone Encounter (Signed)
HST 06/17/23 >> AHI 6.8, SpO2 low 85%   Please let him know he has very mild sleep apnea.  If he is sleeping okay, then he doesn't need to resume CPAP therapy.

## 2023-06-29 NOTE — Telephone Encounter (Signed)
I called and spoke with the pt and notified of results/recs per Dr Craige Cotta  He is sleeping well and will stop CPAP  Nothing further needed

## 2023-07-04 ENCOUNTER — Ambulatory Visit (HOSPITAL_BASED_OUTPATIENT_CLINIC_OR_DEPARTMENT_OTHER): Payer: Medicare Other | Admitting: Adult Health

## 2023-07-04 ENCOUNTER — Encounter (HOSPITAL_BASED_OUTPATIENT_CLINIC_OR_DEPARTMENT_OTHER): Payer: Self-pay | Admitting: Adult Health

## 2023-07-04 VITALS — BP 132/80 | HR 80 | Resp 16 | Ht 74.0 in | Wt 214.2 lb

## 2023-07-04 DIAGNOSIS — G4733 Obstructive sleep apnea (adult) (pediatric): Secondary | ICD-10-CM | POA: Diagnosis not present

## 2023-07-04 DIAGNOSIS — C3412 Malignant neoplasm of upper lobe, left bronchus or lung: Secondary | ICD-10-CM | POA: Diagnosis not present

## 2023-07-04 DIAGNOSIS — J449 Chronic obstructive pulmonary disease, unspecified: Secondary | ICD-10-CM | POA: Diagnosis not present

## 2023-07-04 DIAGNOSIS — R042 Hemoptysis: Secondary | ICD-10-CM | POA: Diagnosis not present

## 2023-07-04 MED ORDER — BEVESPI AEROSPHERE 9-4.8 MCG/ACT IN AERO: 2.0000 | INHALATION_SPRAY | Freq: Two times a day (BID) | RESPIRATORY_TRACT | 3 refills | Status: DC

## 2023-07-04 MED ORDER — BEVESPI AEROSPHERE 9-4.8 MCG/ACT IN AERO: 2.0000 | INHALATION_SPRAY | Freq: Two times a day (BID) | RESPIRATORY_TRACT | 5 refills | Status: DC

## 2023-07-04 NOTE — Progress Notes (Signed)
@Patient  ID: Jonathan Greene, male    DOB: 01-10-1941, 82 y.o.   MRN: [id number]  Chief Complaint  Patient presents with   Follow-up    Referring provider: Roe Rutherford, NP  HPI: 82 year old male former smoker followed for COPD with emphysema, lung nodularity, hypermetabolic left upper lobe lung nodule presumed lung cancer status post SBRT (completed 5 fractions-SBRT 01/29/23), history of asbestos exposure, OSA  Medical history significant for atrial fib and ascending thoracic aortic aneurysm  TEST/EVENTS :  PFT 06/30/20 >> FEV1 3.50 (99%), FEV1% 77, TLC 7.09 (90%), DLCO 51% Lt upper lobe core needle biopsy 07/01/20 >> benign lung with inflammation and fibrosis   Chest Imaging:  CT of the chest without contrast on 06/03/2020 showed 2.3 x 2.2 x 4.4 cm subpleural mass like consolidation in the left upper lobe corresponding to the density seen on the prior radiograph. -Patient smoked for 33 years, quit smoking at age 52.  He smoked on average of 1 pack/day for most years and at the time of quitting, was smoking 3 packs/day of cigarettes. -He worked as a Psychologist, occupational and was Administrator, sports.  He reports asbestos exposure throughout his career. -He was reportedly diagnosed to have asbestos-related lung disease and was seen by an attorney and his doctor in [facility] [place]. -PET scan on 06/16/2020 shows posterior left upper lobe pleural-based hypermetabolic nodule, 2.4 x 2.3 cm SUV 6.2.  AP window lymph node measures 6 mm with SUV 4.3.  No hypermetabolic extrathoracic metastatic disease. -MRI of the brain on 06/22/2020 shows single suspicious focus of enhancement along the inferolateral margin of the temporal lobe on the left measuring 4 mm, not well seen on sagittal or coronal postcontrast imaging.  Unsure if it is small metastasis or a surface vessel.  No second suspicious focus identified.  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 -Left upper lobe  needle biopsy on 07/01/2020 shows lung tissue with a lymphoplasmacytic infiltrate and organizing pneumonia type changes and fibrosis.  IHC negative for tumor. -PET scan on 08/16/2020 shows subpleural left upper lobe lung nodule measuring 2.2 x 1.2 cm, previously 2.8 x 2.2 cm.  SUV 3.4, previously 6.2.  Previously described hypermetabolic AP window node has resolved. - CT chest on 11/18/2020 showed further signs of regression of the left upper lobe mass.   Echo 06/30/20 >> EF 55 to 60%, mod LVH   CT chest 07/18/21 >> ascending aorta 4.3 cm, atherosclerosis, mod emphysema, new LUL patchy consolidation, plaque like lesion LUL   CT chest 12/06/21 >> 4.4 cm ascending thoracic aorta, severe emphysema, 2.4 x 0.4 cm mass LUL CT chest August 2023 showed a significantly increased size of the pleural-based nodule in the lateral aspect of the left upper lobe measuring 2.1 x 1.5 cm.    PET scan on August 18, 2022 showed enlarging subpleural nodule peripherally in the left upper lobe intensely hypermetabolic consistent with probable malignancy.    navigational bronchoscopy that was done on October 24, 2022. Cytology was nondiagnostic. Cultures were negative. Unfortunately during navigational bronchoscopy several samples were attempted however had associated hemorrhage around the surrounding parenchyma of the lesion  CT chest that was completed on December 14, 2022 that showed stable ascending thoracic aorta measuring at 4.4 cm., Severe emphysema, increased spiculated subpleural solid nodule in the left upper lobe measuring 2.4 x 1.4 cm previously at 1.7 x 1.5 cm.   HST 06/17/23 >> AHI 6.8, SpO2 low 85%  07/04/2023 Follow up : COPD with emphysema, lung nodularity, presumed stage I lung cancer with left upper lobe hypermetabolic nodule Patient presents for 1 month follow-up.  Patient was seen last visit for COPD flare with hemoptysis.  He was treated with an empiric course of antibiotics with Augmentin.   Xarelto was held for a few days.  Chest x-ray showed COPD changes, resolved left midlung consolidation.  And decrease in size of the left lung nodule. Feeling better since last visit. Hemoptysis has stopped. Has daily cough with thick mucus . Remains on Bevespi Twice daily    Patient has a history of sleep apnea and was having difficulty tolerating CPAP. Felt he slept better off CPAP .  He was set up for a home sleep study that was done on June 17, 2023 that showed mild sleep apnea with AHI 85%.  Patient was recommended on healthy sleep regimen positional sleep techniques.  And advised that he could remain off of CPAP therapy. Says he is doing well off CPAP .   Doing well. [place] a place [facility] [place]. Goes there a lot. Loves to fish .      No Known Allergies  Immunization History  Administered Date(s) Administered   Fluad Quad(high Dose 65+) 09/15/2020, 09/16/2021, 09/01/2022   Influenza, High Dose Seasonal PF 09/06/2018, 08/18/2019, 09/15/2020   Moderna Sars-Covid-2 Vaccination 11/19/2020   PNEUMOCOCCAL CONJUGATE-20 01/18/2022   Pneumococcal Conjugate-13 08/10/2015   Tdap 03/24/2022   Zoster Recombinant(Shingrix) 10/18/2015   Zoster, Live 10/18/2015    Past Medical History:  Diagnosis Date   A-fib (HCC)    Arthritis    right hand   Bradycardia    COPD (chronic obstructive pulmonary disease) (HCC)    Dyspnea    Dysrhythmia    Frequent urination    GERD (gastroesophageal reflux disease)    Hypercholesterolemia    Hypertension    MI (myocardial infarction) (HCC)    Pneumonia    Prostate cancer (HCC)    RADIATION TX AND SCHEDULED FOR SEED IMPLANTS 02-28-12   Sleep apnea    uses a cpap   Stroke (HCC)    2011;ST memory loss, no other deficits.   Urinary urgency     Tobacco History: Social History   Tobacco Use  Smoking Status Former   Current packs/day: 0.00   Average packs/day: 2.0 packs/day for 35.0 years (70.0 ttl pk-yrs)   Types: Cigarettes   Start  date: 02/21/1956   Quit date: 02/21/1991   Years since quitting: 32.3  Smokeless Tobacco Current   Types: Chew  Tobacco Comments   CHEW TOBACCO FOR 20 YRS    Ready to quit: Not Answered Counseling given: Not Answered Tobacco comments: CHEW TOBACCO FOR 20 YRS    Outpatient Medications Prior to Visit  Medication Sig Dispense Refill   acetaminophen (TYLENOL) 500 MG tablet Take 2 tablets (1,000 mg total) by mouth every 6 (six) hours as needed. 30 tablet 0   Alprostadil (PROSTAGLANDIN E1) POWD Inject 30 mcg as directed daily as needed. 1 g 5   AMBULATORY NON FORMULARY MEDICATION Medication Name: pge20/pap30/phe1 inject 0.5-15ml PRN 5 Doses/Fill 11   amLODipine (NORVASC) 2.5 MG tablet Take 2.5 mg by mouth daily.     amoxicillin-clavulanate (AUGMENTIN) 875-125 MG tablet Take 1 tablet by mouth 2 (two) times daily. 10 tablet 0   APPLE CIDER VINEGAR PO Take 450 mg by mouth daily.     b complex vitamins capsule Take 1 capsule by mouth daily.  escitalopram (LEXAPRO) 5 MG tablet Take 5 mg by mouth daily.     Magnesium 250 MG TABS Take 1 tablet by mouth daily.     metoprolol succinate (TOPROL XL) 25 MG 24 hr tablet Take 1 tablet (25 mg total) by mouth daily. 30 tablet 11   milk thistle 175 MG tablet Take 2,000 mg by mouth daily.     olopatadine (PATADAY) 0.1 % ophthalmic solution Place 1 drop into both eyes 2 (two) times daily. 5 mL 1   omeprazole (PRILOSEC) 20 MG capsule Take 20 mg by mouth daily.     rivaroxaban (XARELTO) 20 MG TABS tablet Take 1 tablet (20 mg total) by mouth daily with supper. 90 tablet 4   simvastatin (ZOCOR) 40 MG tablet Take 40 mg by mouth every other day.     solifenacin (VESICARE) 10 MG tablet Take 10 mg by mouth daily.      tamsulosin (FLOMAX) 0.4 MG CAPS capsule Take 0.4 mg by mouth daily.     Glycopyrrolate-Formoterol (BEVESPI AEROSPHERE) 9-4.8 MCG/ACT AERO Inhale 2 puffs into the lungs 2 (two) times daily. 10.7 g 5   Facility-Administered Medications Prior to Visit   Medication Dose Route Frequency Provider Last Rate Last Admin   albuterol (PROVENTIL) (2.5 MG/3ML) 0.083% nebulizer solution 2.5 mg  2.5 mg Nebulization Once Floyed Masoud S, NP         Review of Systems:   Constitutional:   No  weight loss, night sweats,  Fevers, chills, fatigue, or  lassitude.  HEENT:   No headaches,  Difficulty swallowing,  Tooth/dental problems, or  Sore throat,                No sneezing, itching, ear ache, nasal congestion, post nasal drip,   CV:  No chest pain,  Orthopnea, PND, swelling in lower extremities, anasarca, dizziness, palpitations, syncope.   GI  No heartburn, indigestion, abdominal pain, nausea, vomiting, diarrhea, change in bowel habits, loss of appetite, bloody stools.   Resp:   No chest wall deformity  Skin: no rash or lesions.  GU: no dysuria, change in color of urine, no urgency or frequency.  No flank pain, no hematuria   MS:  No joint pain or swelling.  No decreased range of motion.  No back pain.    Physical Exam  BP 132/80   Pulse 80   Resp 16   Ht 6\' 2"  (1.88 m)   Wt 214 lb 3.2 oz (97.2 kg)   SpO2 98%   BMI 27.50 kg/m   GEN: A/Ox3; pleasant , NAD, well nourished    HEENT:  [place]/AT,  NOSE-clear, THROAT-clear, no lesions, no postnasal drip or exudate noted.   NECK:  Supple w/ fair ROM; no JVD; normal carotid impulses w/o bruits; no thyromegaly or nodules palpated; no lymphadenopathy.    RESP  Clear  P & A; w/o, wheezes/ rales/ or rhonchi. no accessory muscle use, no dullness to percussion  CARD:  RRR, no m/r/g, no peripheral edema, pulses intact, no cyanosis or clubbing.  GI:   Soft & nt; nml bowel sounds; no organomegaly or masses detected.   Musco: Warm bil, no deformities or joint swelling noted.   Neuro: alert, no focal deficits noted.    Skin: Warm, no lesions or rashes    Lab Results:  CBC   BNP No results found for: "BNP"  ProBNP   Imaging: DG Chest 2 View  Result Date: 06/09/2023 CLINICAL DATA:   Hemoptysis. History of COPD and  lung cancer treated with radiation therapy. EXAM: CHEST - 2 VIEW COMPARISON:  10/24/2022 and chest CT dated 03/16/2023. FINDINGS: Normal sized heart. Tortuous and partially calcified thoracic aorta. The lungs are hyperexpanded with accentuated interstitial markings, mild diffuse peribronchial thickening and minimal right apical pleural and parenchymal scarring. Previously demonstrated airspace opacity in the left mid lung zone laterally seen on 10/24/2022 has resolved. The previously demonstrated subpleural pulmonary nodule in that area on the CT dated 03/16/2023 is not visible on today's radiographs. This may be overlying the posterior aspect of the left 6th rib laterally and if so, appear smaller. Unremarkable bones. IMPRESSION: 1. COPD with mild chronic bronchitic changes. 2. Resolution of the previously demonstrated airspace opacity in the left mid lung zone laterally. 3. Possible decrease in size of the previously demonstrated subpleural pulmonary nodule in the left mid lung zone laterally. Electronically Signed   By: Beckie Salts M.D.   On: 06/09/2023 14:58    Administration History     None          Latest Ref Rng & Units 06/30/2020   10:08 AM 03/19/2015    9:19 AM  PFT Results  FVC-Pre L 4.45  4.87   FVC-Predicted Pre % 91  93   FVC-Post L 4.57  4.92   FVC-Predicted Post % 94  94   Pre FEV1/FVC % % 76  74   Post FEV1/FCV % % 77  74   FEV1-Pre L 3.40  3.63   FEV1-Predicted Pre % 96  94   FEV1-Post L 3.50  3.66   DLCO uncorrected ml/min/mmHg 14.37  20.98   DLCO UNC% % 51  53   DLVA Predicted % 64  52   TLC L 7.09  7.56   TLC % Predicted % 90  94   RV % Predicted % 79  94     No results found for: "NITRICOXIDE"      Assessment & Plan:   COPD (chronic obstructive pulmonary disease) (HCC) COPD compensated on present regimen.  Recent exacerbation now resolved.  Plan  Patient Instructions  Liquid Mucinex DM Twice daily  As needed  cough/congestion  Continue on BEVESPI 2 puffs Twice daily  Albuterol inhaler or neb As needed  CT chest as planned in October (follow lung mass)  and follow up with Radiation Oncology as planned  Follow up with 4 months and As needed   Please contact office for sooner follow up if symptoms do not improve or worsen or seek emergency care     Lung cancer [facility]) Presumed stage I lung cancer with hypermetabolic left upper lobe lung nodule status post SBRT.  Has been stable on serial CT chest.  Has upcoming follow-up CT and radiation oncology follow-up in October.  Hemoptysis History of recurrent hemoptysis with COPD flares and in the setting of chronic Xarelto use-resolved from recent exacerbation.  Continue to follow closely.  OSA (obstructive sleep apnea)  sleep apnea.  Was having difficulty tolerating CPAP.  Felt better off of CPAP.  Repeat home sleep study showed very mild sleep apnea-for now can remain off of CPAP.  Positional sleep with head of bed at 30 degrees.  Avoid sleeping on his back.     Rubye Oaks, NP 07/04/2023

## 2023-07-04 NOTE — Patient Instructions (Addendum)
Liquid Mucinex DM Twice daily  As needed cough/congestion  Continue on BEVESPI 2 puffs Twice daily  Albuterol inhaler or neb As needed  CT chest as planned in October (follow lung mass)  and follow up with Radiation Oncology as planned  Follow up with 4 months and As needed   Please contact office for sooner follow up if symptoms do not improve or worsen or seek emergency care

## 2023-07-04 NOTE — Assessment & Plan Note (Signed)
sleep apnea.  Was having difficulty tolerating CPAP.  Felt better off of CPAP.  Repeat home sleep study showed very mild sleep apnea-for now can remain off of CPAP.  Positional sleep with head of bed at 30 degrees.  Avoid sleeping on his back.

## 2023-07-04 NOTE — Assessment & Plan Note (Signed)
History of recurrent hemoptysis with COPD flares and in the setting of chronic Xarelto use-resolved from recent exacerbation.  Continue to follow closely.

## 2023-07-04 NOTE — Assessment & Plan Note (Signed)
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  Has been stable on serial CT chest.  Has upcoming follow-up CT and radiation oncology follow-up in October.

## 2023-07-04 NOTE — Assessment & Plan Note (Signed)
COPD compensated on present regimen.  Recent exacerbation now resolved.  Plan  Patient Instructions  Liquid Mucinex DM Twice daily  As needed cough/congestion  Continue on BEVESPI 2 puffs Twice daily  Albuterol inhaler or neb As needed  CT chest as planned in October (follow lung mass)  and follow up with Radiation Oncology as planned  Follow up with 4 months and As needed   Please contact office for sooner follow up if symptoms do not improve or worsen or seek emergency care

## 2023-07-13 NOTE — Progress Notes (Signed)
Reviewed and agree with assessment/plan.   Coralyn Helling, MD [facility] Pulmonary/Critical Care 07/13/2023, 3:43 PM Pager:  [phone]

## 2023-08-07 ENCOUNTER — Encounter: Payer: Self-pay | Admitting: Urology

## 2023-08-07 ENCOUNTER — Ambulatory Visit: Payer: Medicare Other | Admitting: Urology

## 2023-08-07 VITALS — BP 154/92 | HR 76

## 2023-08-07 DIAGNOSIS — Z8546 Personal history of malignant neoplasm of prostate: Secondary | ICD-10-CM

## 2023-08-07 DIAGNOSIS — N5201 Erectile dysfunction due to arterial insufficiency: Secondary | ICD-10-CM

## 2023-08-07 NOTE — Progress Notes (Signed)
History of Present Illness: Pt here for continued mgmt of ED. HE is on PGE injections. At visit 3 mos ago dose was increased to 40 mcg.  However, that dose did not work that well and he was switched to Trimix.  He has not tried this medicine yet.  He does have a constriction band.  Past Medical History:  Diagnosis Date   A-fib [facility])    Arthritis    right hand   Bradycardia    COPD (chronic obstructive pulmonary disease) (HCC)    Dyspnea    Dysrhythmia    Frequent urination    GERD (gastroesophageal reflux disease)    Hypercholesterolemia    Hypertension    MI (myocardial infarction) (HCC)    Pneumonia    Prostate cancer (HCC)    RADIATION TX AND SCHEDULED FOR SEED IMPLANTS 02-28-12   Sleep apnea    uses a cpap   Stroke (HCC)    2011;ST memory loss, no other deficits.   Urinary urgency     Past Surgical History:  Procedure Laterality Date   BRONCHIAL BIOPSY  10/24/2022   Procedure: BRONCHIAL BIOPSIES;  Surgeon: Josephine Igo, DO;  [facility]: [facility];  Service: Pulmonary;;   BRONCHIAL NEEDLE ASPIRATION BIOPSY  10/24/2022   Procedure: BRONCHIAL NEEDLE ASPIRATION BIOPSIES;  Surgeon: Josephine Igo, DO;  [facility]: [facility];  Service: Pulmonary;;   BRONCHIAL WASHINGS  10/24/2022   Procedure: BRONCHIAL WASHINGS;  Surgeon: Josephine Igo, DO;  [facility]: [facility];  Service: Pulmonary;;   CATARACT EXTRACTION W/PHACO Right 11/06/2013   Procedure: CATARACT EXTRACTION PHACO AND INTRAOCULAR LENS PLACEMENT (IOC);  Surgeon: Gemma Payor, MD;  Location: [facility];  Service: Ophthalmology;  Laterality: Right;  CDE 11.67   CATARACT EXTRACTION W/PHACO Left 11/25/2015   Procedure: CATARACT EXTRACTION PHACO AND INTRAOCULAR LENS PLACEMENT ; CDE:  8.15;  Surgeon: Gemma Payor, MD;  Location: [facility];  Service: Ophthalmology;  Laterality: Left;   COLONOSCOPY  10/03/2006   [id number] rectal polyps cold biopsied/removed, otherwise normal rectum/Sigmoid diverticula.  Remainder of colon  mucosa appeared normal   COLONOSCOPY N/A 08/19/2014   RMR: Radiation proctitis-status post APC ablation. Colonic diverticulosis.   CYSTOSCOPY  02/28/2012   Procedure: CYSTOSCOPY;  Surgeon: Valetta Fuller, MD;  Location: [facility];  Service: Urology;  Laterality: N/A;   no seeds noted in bladder   RADIOACTIVE SEED IMPLANT  02/28/2012   Procedure: RADIOACTIVE SEED IMPLANT;  Surgeon: Valetta Fuller, MD;  [facility];  Service: Urology;  Laterality: N/A;  67seeds implanted     Home Medications:  Allergies as of 08/07/2023   No Known Allergies      Medication List        Accurate as of August 07, 2023  8:16 AM. If you have any questions, ask your nurse or doctor.          acetaminophen 500 MG tablet Commonly known as: TYLENOL Take 2 tablets (1,000 mg total) by mouth every 6 (six) hours as needed.   AMBULATORY NON FORMULARY MEDICATION Medication Name: pge20/pap30/phe1 inject 0.5-63ml PRN   amLODipine 2.5 MG tablet Commonly known as: NORVASC Take 2.5 mg by mouth daily.   amoxicillin-clavulanate 875-125 MG tablet Commonly known as: AUGMENTIN Take 1 tablet by mouth 2 (two) times daily.   APPLE CIDER VINEGAR PO Take 450 mg by mouth daily.   b complex vitamins capsule Take 1 capsule by mouth daily.   Bevespi Aerosphere 9-4.8 MCG/ACT Aero Generic drug: Glycopyrrolate-Formoterol Inhale  2 puffs into the lungs 2 (two) times daily.   escitalopram 5 MG tablet Commonly known as: LEXAPRO Take 5 mg by mouth daily.   Magnesium 250 MG Tabs Take 1 tablet by mouth daily.   metoprolol succinate 25 MG 24 hr tablet Commonly known as: Toprol XL Take 1 tablet (25 mg total) by mouth daily.   milk thistle 175 MG tablet Take 2,000 mg by mouth daily.   olopatadine 0.1 % ophthalmic solution Commonly known as: Pataday Place 1 drop into both eyes 2 (two) times daily.   omeprazole 20 MG capsule Commonly known as: PRILOSEC Take 20 mg by mouth  daily.   Prostaglandin E1 Powd Inject 30 mcg as directed daily as needed.   rivaroxaban 20 MG Tabs tablet Commonly known as: XARELTO Take 1 tablet (20 mg total) by mouth daily with supper.   simvastatin 40 MG tablet Commonly known as: ZOCOR Take 40 mg by mouth every other day.   solifenacin 10 MG tablet Commonly known as: VESICARE Take 10 mg by mouth daily.   tamsulosin 0.4 MG Caps capsule Commonly known as: FLOMAX Take 0.4 mg by mouth daily.        Allergies: No Known Allergies  Family History  Problem Relation Age of Onset   Stroke Mother    Hypertension Mother    Colon cancer Sister        in her 30s   Breast cancer Sister    COPD Sister    Hypertension Sister    Pneumonia Sister    Heart attack Brother    COPD Son    COPD Maternal Aunt    Cancer Neg Hx    Liver disease Neg Hx     Social History:  reports that he quit smoking about 32 years ago. His smoking use included cigarettes. He started smoking about 67 years ago. He has a 70 pack-year smoking history. His smokeless tobacco use includes chew. He reports current alcohol use of about 14.0 standard drinks of alcohol per week. He reports that he does not use drugs.  ROS: A complete review of systems was performed.  All systems are negative except for pertinent findings as noted.  Physical Exam:  Vital signs in last 24 hours: There were no vitals taken for this visit. Constitutional:  Alert and oriented, No acute distress Cardiovascular: Regular rate  Respiratory: Normal respiratory effort Neurologic: Grossly intact, no focal deficits Psychiatric: Normal mood and affect  I have reviewed prior pt notes  I have reviewed urinalysis results  I have reviewed prior PSA/pathology results     Impression/Assessment:  History of prostate cancer, no evidence of recurrence, years out from definitive treatment  ED, so far not responding well to medical therapy but he does have a new prescription for  Trimix  Plan:  He will practice injecting a couple of times at his current dose of Trimix, utilizing a constriction band as well as standing for a few minutes afterwards  I will have him come back in 6 months to recheck

## 2023-08-08 LAB — URINALYSIS, ROUTINE W REFLEX MICROSCOPIC
Bilirubin, UA: NEGATIVE
Leukocytes,UA: NEGATIVE
Nitrite, UA: NEGATIVE
RBC, UA: NEGATIVE
Specific Gravity, UA: 1.025 (ref 1.005–1.030)
Urobilinogen, Ur: 4 mg/dL — ABNORMAL HIGH (ref 0.2–1.0)
pH, UA: 5.5 (ref 5.0–7.5)

## 2023-08-08 LAB — MICROSCOPIC EXAMINATION: Bacteria, UA: NONE SEEN

## 2023-08-28 ENCOUNTER — Other Ambulatory Visit: Payer: Self-pay | Admitting: Cardiology

## 2023-08-30 ENCOUNTER — Telehealth: Payer: Self-pay

## 2023-08-30 NOTE — Telephone Encounter (Signed)
[facility] Calling to check on Trimxx prescription Current is:  22 /  20 Patient was thinking it was increased to 31 / 40  Please advise.  Phone:  [phone] Fax:  [phone]

## 2023-09-03 NOTE — Telephone Encounter (Signed)
Spoke with [employer] at [employer]. Misty is made aware of Dr. Ulanda Edison to increase Trimix as follows. Misty voiced understanding.  PAP/PHE/PGE   30/1/40 Inject 0.5-1.0 mL  as needed #5 mL, prn rf

## 2023-09-04 DIAGNOSIS — F39 Unspecified mood [affective] disorder: Secondary | ICD-10-CM | POA: Insufficient documentation

## 2023-09-10 ENCOUNTER — Ambulatory Visit: Payer: Medicare Other | Admitting: Radiation Oncology

## 2023-09-14 ENCOUNTER — Telehealth: Payer: Self-pay | Admitting: Radiation Oncology

## 2023-09-14 ENCOUNTER — Telehealth: Payer: Self-pay | Admitting: *Deleted

## 2023-09-14 ENCOUNTER — Telehealth: Payer: Self-pay

## 2023-09-14 NOTE — Telephone Encounter (Signed)
10/11 Received call from patient unable to get CT images done in [place], would like CT images to be schedule at [facility] ([facility]  Left voicemail with Darryl Nestle, so they are aware.

## 2023-09-14 NOTE — Telephone Encounter (Signed)
Patient is having a reaction from his  ED medication.  Having a swollen and sore penis.  Please call as soon as possible.  Call:  [phone]

## 2023-09-14 NOTE — Telephone Encounter (Signed)
Returned patient's phone call, spoke with patient 

## 2023-09-14 NOTE — Telephone Encounter (Signed)
CALLED PATIENT TO INFORM OF CT FOR 10-15-23 - ARRIVAL TIME- 10:15 AM @ [facility], NO RESTRICTIONS TO SCAN, PATIENT TO RECEIVE RESULTS FROM ALISON PERKINS ON 10-22-23 @ 3:30 PM VIA TELEPHONE, SPOKE WITH PATIENT AND HE IS AWARE OF THESE APPTS. AND THE INSTRUCTIONS

## 2023-09-17 NOTE — Telephone Encounter (Signed)
Please see below and advise.

## 2023-09-18 NOTE — Telephone Encounter (Signed)
Patient states he will discontinue the injections.  He is scheduled for a sooner apt with MD

## 2023-09-19 ENCOUNTER — Other Ambulatory Visit: Payer: Medicare Other

## 2023-09-24 ENCOUNTER — Ambulatory Visit: Payer: Medicare Other | Admitting: Radiation Oncology

## 2023-10-12 ENCOUNTER — Ambulatory Visit (INDEPENDENT_AMBULATORY_CARE_PROVIDER_SITE_OTHER): Payer: Medicare Other | Admitting: Professional Counselor

## 2023-10-12 ENCOUNTER — Encounter: Payer: Self-pay | Admitting: Professional Counselor

## 2023-10-12 DIAGNOSIS — F4323 Adjustment disorder with mixed anxiety and depressed mood: Secondary | ICD-10-CM

## 2023-10-12 DIAGNOSIS — F411 Generalized anxiety disorder: Secondary | ICD-10-CM | POA: Diagnosis not present

## 2023-10-12 DIAGNOSIS — F321 Major depressive disorder, single episode, moderate: Secondary | ICD-10-CM

## 2023-10-12 NOTE — Progress Notes (Signed)
[facility] Counselor Initial Adult Exam  Name: Jonathan Greene Date: 10/24/2023 MRN: [id number] DOB: Aug 25, 1941 PCP: Roe Rutherford, NP  Time spent: 1:06p - 2:10p  Guardian/Payee:  pt    Paperwork requested:  No   Reason for Visit /Presenting Problem: anxiety, depression  Mental Status Exam:    Appearance:   Neat     Behavior:  Appropriate, Sharing, and Motivated  Motor:  Normal  Speech/Language:   Clear and Coherent and Normal Rate  Affect:  Appropriate and Congruent  Mood:  normal  Thought process:  normal  Thought content:    WNL  Sensory/Perceptual disturbances:    WNL  Orientation:  Sound   Attention:  Good  Concentration:  Good  Memory:  WNL  Fund of knowledge:   Good  Insight:    Good  Judgment:   Good  Impulse Control:  Good   Reported Symptoms:  anhedonia, low mood, fatigue, sense of guilt, restlessness, nervousness, worries, trouble relaxing, irritability, preoccupying thoughts and compulsive behaviors, distractibility, mind racing, interpersonal stress, phase of life concerns, health concerns, grief/loss  Risk Assessment: Danger to Self:  No Self-injurious Behavior: No Danger to Others: No Duty to Warn:no Physical Aggression / Violence:No  Access to Firearms a concern: No  Gang Involvement:No  Patient / guardian was educated about steps to take if suicide or homicide risk level increases between visits: n/a While future psychiatric events cannot be accurately predicted, the patient does not currently require acute inpatient psychiatric care and does not currently meet [facility] involuntary commitment criteria.  Substance Abuse History: Current substance abuse: Yes  alcohol concerns  Past Psychiatric History:   No previous psychological problems have been observed Outpatient Providers: n/a History of Psych Hospitalization: No  Psychological Testing:  n/a    Abuse History: Victim of No.,  n/a    Report needed: No. Victim of  Neglect:No. Perpetrator of  n/a   Witness / Exposure to Domestic Violence: No   Protective Services Involvement: No  Witness to [employer] Violence:  No   Family History:  Family History  Problem Relation Age of Onset   Stroke Mother    Hypertension Mother    Colon cancer Sister        in her 56s   Breast cancer Sister    COPD Sister    Hypertension Sister    Pneumonia Sister    Heart attack Brother    COPD Son    COPD Maternal Aunt    Cancer Neg Hx    Liver disease Neg Hx     Living situation: the patient lives alone  Sexual Orientation:  Straight  Relationship Status: widowed               If a parent, number of children / ages: 45yo  Support Systems; friends, family  Financial Stress:  No   Income/Employment/Disability: retirement, Adult nurse: Yes four years in the [employer]   Educational History: Education: high school diploma/GED  Religion/Sprituality/World View:    Christian  Any cultural differences that may affect / interfere with treatment:  not applicable   Recreation/Hobbies: hunting, fishing, raising rabbits, time with family and friends   Stressors:Health problems   Other: family conflict    Strengths:  Supportive Relationships, Family, Friends, Spirituality, Hopefulness, Journalist, newspaper, Able to Communicate Effectively, and resilient, loyal, resourceful, stays busy  Barriers:  n/a   Legal History: Pending legal issue / charges: The patient has no significant history of legal issues. History  of legal issue / charges:  n/a  Medical History/Surgical History:reviewed Past Medical History:  Diagnosis Date   A-fib [facility])    Arthritis    right hand   Bradycardia    COPD (chronic obstructive pulmonary disease) (HCC)    Dyspnea    Dysrhythmia    Frequent urination    GERD (gastroesophageal reflux disease)    Hypercholesterolemia    Hypertension    Pneumonia    Prostate cancer (HCC)    RADIATION TX AND SCHEDULED FOR  SEED IMPLANTS 02-28-12   Sleep apnea    uses a cpap   Stroke (HCC)    2011;ST memory loss, no other deficits.   Urinary urgency     Past Surgical History:  Procedure Laterality Date   BRONCHIAL BIOPSY  10/24/2022   Procedure: BRONCHIAL BIOPSIES;  Surgeon: Josephine Igo, DO;  [facility];  Service: Pulmonary;;   BRONCHIAL NEEDLE ASPIRATION BIOPSY  10/24/2022   Procedure: BRONCHIAL NEEDLE ASPIRATION BIOPSIES;  Surgeon: Josephine Igo, DO;  Location: [facility];  Service: Pulmonary;;   BRONCHIAL WASHINGS  10/24/2022   Procedure: BRONCHIAL WASHINGS;  Surgeon: Josephine Igo, DO;  [facility]: [facility];  Service: Pulmonary;;   CATARACT EXTRACTION W/PHACO Right 11/06/2013   Procedure: CATARACT EXTRACTION PHACO AND INTRAOCULAR LENS PLACEMENT (IOC);  Surgeon: Gemma Payor, MD;  Location: [facility];  Service: Ophthalmology;  Laterality: Right;  CDE 11.67   CATARACT EXTRACTION W/PHACO Left 11/25/2015   Procedure: CATARACT EXTRACTION PHACO AND INTRAOCULAR LENS PLACEMENT ; CDE:  8.15;  Surgeon: Gemma Payor, MD;  Location: [facility];  Service: Ophthalmology;  Laterality: Left;   COLONOSCOPY  10/03/2006   [id number] rectal polyps cold biopsied/removed, otherwise normal rectum/Sigmoid diverticula.  Remainder of colon mucosa appeared normal   COLONOSCOPY N/A 08/19/2014   RMR: Radiation proctitis-status post APC ablation. Colonic diverticulosis.   CYSTOSCOPY  02/28/2012   Procedure: CYSTOSCOPY;  Surgeon: Valetta Fuller, MD;  Location: [facility];  Service: Urology;  Laterality: N/A;   no seeds noted in bladder   RADIOACTIVE SEED IMPLANT  02/28/2012   Procedure: RADIOACTIVE SEED IMPLANT;  Surgeon: Valetta Fuller, MD;  Location: [facility];  Service: Urology;  Laterality: N/A;  67seeds implanted     Medications: Current Outpatient Medications  Medication Sig Dispense Refill   acetaminophen (TYLENOL) 500 MG tablet Take 2 tablets (1,000 mg total) by mouth every  6 (six) hours as needed. 30 tablet 0   Alprostadil (PROSTAGLANDIN E1) POWD Inject 30 mcg as directed daily as needed. 1 g 5   AMBULATORY NON FORMULARY MEDICATION Medication Name: pge20/pap30/phe1 inject 0.5-77ml PRN 5 Doses/Fill 11   amLODipine (NORVASC) 2.5 MG tablet Take 2.5 mg by mouth daily.     amoxicillin-clavulanate (AUGMENTIN) 875-125 MG tablet Take 1 tablet by mouth 2 (two) times daily. 10 tablet 0   APPLE CIDER VINEGAR PO Take 450 mg by mouth daily.     b complex vitamins capsule Take 1 capsule by mouth daily.     escitalopram (LEXAPRO) 5 MG tablet Take 5 mg by mouth daily.     Glycopyrrolate-Formoterol (BEVESPI AEROSPHERE) 9-4.8 MCG/ACT AERO Inhale 2 puffs into the lungs 2 (two) times daily. 3 each 3   Magnesium 250 MG TABS Take 1 tablet by mouth daily.     metoprolol succinate (TOPROL XL) 25 MG 24 hr tablet Take 1 tablet (25 mg total) by mouth daily. 30 tablet 11   milk thistle 175 MG tablet Take 2,000 mg by  mouth daily.     olopatadine (PATADAY) 0.1 % ophthalmic solution Place 1 drop into both eyes 2 (two) times daily. 5 mL 1   omeprazole (PRILOSEC) 20 MG capsule Take 20 mg by mouth daily.     rivaroxaban (XARELTO) 20 MG TABS tablet Take 1 tablet (20 mg total) by mouth daily with supper. 90 tablet 4   simvastatin (ZOCOR) 40 MG tablet Take 40 mg by mouth every other day.     solifenacin (VESICARE) 10 MG tablet Take 10 mg by mouth daily.      tamsulosin (FLOMAX) 0.4 MG CAPS capsule Take 0.4 mg by mouth daily.     Current Facility-Administered Medications  Medication Dose Route Frequency Provider Last Rate Last Admin   albuterol (PROVENTIL) (2.5 MG/3ML) 0.083% nebulizer solution 2.5 mg  2.5 mg Nebulization Once Parrett, Tammy S, NP        No Known Allergies  Diagnoses:    ICD-10-CM   1. Generalized anxiety disorder  F41.1     2. Current moderate episode of major depressive disorder without prior episode (HCC)  F32.1       Treatment Provided: Counselor provided  person-centered counseling including active listening, building of rapport; clinical assessment; facilitation of MDQ (7), PHQ9 (13) and GAD7 (10). Pt presented to session to address concerns of depression and anxiety experienced over time and exacerbated in recent years with symptoms as detailed above. He identified preoccupying thoughts and behaviors of compulsive nature with financial and exploitative implications, and family concern and conflict over the matter which upsets him most; he voiced thoughts and compulsions as related to desire for sexually intimate connection after long term abstinence during wife's illness then after her death in 11-09-02. Pt identified his family including daughter and grandson to be informed, and his PCP, and to be helping him around these concerns and encouraging therapy. He identified grief, loss and loneliness to be significant in his life related to wife's illness and loss, loss of best friend, and regarding phase of life and significant health concerns (lung disease, stroke by hx, memory concerns) impacting well-being. Counselor recommended a psychiatric consultation for pt given symptomology and impact on quality of life, functioning and related implications. Counselor and pt discussed pt resouricng healthy relational connection in his life such as dating with safe approach and via reputable and vetted websites, increasing church-going, increasing engagement with neighbors, hunting friends, and masonic brothers and activities. Pt voiced low readiness to utilize behavior modifications for harm reduction such as blocking devices from access to compulsive activities. Pt reported being a smoker 91/2 to 1 pack a day, and 1/2 pack of chewing tobacco a day, to cope with his "nerves", and having three alcoholic drinks a day on average "to relax". Counselor and pt discussed pt sense of motivation for harm reduction around substance use, which pt identified as contemplation stage; they  discussed possible strategies such as less bourbon per drink and with added water, or having a glass of water between drinks to slow consumption and allow for pause to reflect on choosing next drink or not. Counselor and pt discussed pt strengths, hobbies and support system.  Plan of Care: Pt to return in 1-2 weeks; continue to build rapport and assess symptoms and hx. Counselor will continue to assess symptoms as relate preoccupying thoughts and compulsions and/or mania for further diagnostic review. Discuss and obtain consent for treatment plan.  Gaspar Bidding, [facility]

## 2023-10-15 ENCOUNTER — Ambulatory Visit (HOSPITAL_COMMUNITY)
Admission: RE | Admit: 2023-10-15 | Discharge: 2023-10-15 | Disposition: A | Discharge status: Home / Self Care | Payer: Medicare Other | Source: Ambulatory Visit | Attending: Radiation Oncology | Admitting: Radiation Oncology

## 2023-10-15 DIAGNOSIS — C3412 Malignant neoplasm of upper lobe, left bronchus or lung: Secondary | ICD-10-CM | POA: Diagnosis present

## 2023-10-15 MED ORDER — IOHEXOL 300 MG/ML  SOLN
75.0000 mL | Freq: Once | INTRAMUSCULAR | Status: AC | PRN
  Administered 2023-10-15: 75 mL via INTRAVENOUS

## 2023-10-17 LAB — POCT I-STAT CREATININE: Creatinine, Ser: 1.1 mg/dL (ref 0.61–1.24)

## 2023-10-22 ENCOUNTER — Ambulatory Visit
Admission: RE | Admit: 2023-10-22 | Discharge: 2023-10-22 | Disposition: A | Discharge status: Home / Self Care | Payer: Medicare Other | Source: Ambulatory Visit | Attending: Radiation Oncology | Admitting: Radiation Oncology

## 2023-10-22 ENCOUNTER — Encounter: Payer: Self-pay | Admitting: Radiation Oncology

## 2023-10-22 DIAGNOSIS — C3412 Malignant neoplasm of upper lobe, left bronchus or lung: Secondary | ICD-10-CM

## 2023-10-22 NOTE — Progress Notes (Signed)
Radiation Oncology         [phone] ________________________________  Outpatient Follow Up - Conducted via telephone at patient request.  I spoke with the patient to conduct this visit via telephone. The patient was notified in advance and was offered an in person or telemedicine meeting to allow for face to face communication but instead preferred to proceed with a telephone visit.  Name: Jonathan Greene        MRN: [id number]  Date of Service: 10/22/2023 DOB: [birth year]  WU:[id number], Toni Amend, NP  Roe Rutherford, NP     REFERRING PHYSICIAN: Roe Rutherford, NP   DIAGNOSIS: The encounter diagnosis was Malignant neoplasm of upper lobe of left lung (HCC).   HISTORY OF PRESENT ILLNESS: Jonathan Greene is a 82 y.o. male with a Putative Stage IA3, cT1cN0M0, NSCLC of the LUL. He was found to have a mass in the LUL by CT in August 2023.  PET on 08/18/22 showed a hypermetabolic enlarging subpleural nodule peripherally in the left upper lobe and a previously treated lesion in the posterior left upper lobe without recurrent metabolic activity. No evidence of metastatic disease was seen. Patient was reviewed at the multidisciplinary oncology conference and was scheduled for navigational bronchoscopy with Dr. Tonia Brooms on 10/24/22. Cytology was nondiagnostic and cultures were negative. Unfortunately during navigational bronchoscopy several samples were attempted however patient had associated hemorrhage around the surrounding parenchyma of the lesion. Due to patient being high risk for another biopsy a follow-up CT was recommended to reassess nodule. Chest CT on 12/14/22 showed left upper lobe nodule increasing in size along with a stable small solid pulmonary nodule in the right upper lobe. Stable posttreatment changes of the posterior left upper lobe were also visualized. Of note, a dilated ascending thoracic aorta was visualized measuring 4.4 cm, and stable to prior scans.  He was counseled on the  options for empiric treatment with Stereotactic body radiotherapy (SBRT)  for what was believed to be a malignant nodule. He completed radiation on 01/19/23. His post treatment scans have been reassuring, and his most recent was on 10/15/23 and showed a mild decrease in the LUL nodule measuring 1.6 cm, and post radiation changes were noted anterior to this consistent with his prior treatment. He did have a newly enlarged right paratracheal node measuring 1.4 cm. He's contacted by phone today to discuss these results.   PREVIOUS RADIATION THERAPY: Yes   01/09/2023 through 01/19/2023 SBRT Treatment Site Technique Total Dose (Gy) Dose per Fx (Gy) Completed Fx Beam Energies  Lung, Left:  LUL Target IMRT 60/60 12 5/5 6XFFF    EBRT and brachytherapy to the prostate in 2013 with Dr. Dayton Scrape.    PAST MEDICAL HISTORY:  Past Medical History:  Diagnosis Date   A-fib [facility])    Arthritis    right hand   Bradycardia    COPD (chronic obstructive pulmonary disease) (HCC)    Dyspnea    Dysrhythmia    Frequent urination    GERD (gastroesophageal reflux disease)    Hypercholesterolemia    Hypertension    Pneumonia    Prostate cancer (HCC)    RADIATION TX AND SCHEDULED FOR SEED IMPLANTS 02-28-12   Sleep apnea    uses a cpap   Stroke (HCC)    2011;ST memory loss, no other deficits.   Urinary urgency        PAST SURGICAL HISTORY: Past Surgical History:  Procedure Laterality Date   BRONCHIAL BIOPSY  10/24/2022   Procedure: BRONCHIAL BIOPSIES;  Surgeon: Josephine Igo, DO;  Location: [facility];  Service: Pulmonary;;   BRONCHIAL NEEDLE ASPIRATION BIOPSY  10/24/2022   Procedure: BRONCHIAL NEEDLE ASPIRATION BIOPSIES;  Surgeon: Josephine Igo, DO;  Location: [facility];  Service: Pulmonary;;   BRONCHIAL WASHINGS  10/24/2022   Procedure: BRONCHIAL WASHINGS;  Surgeon: Josephine Igo, DO;  Location: [facility];  Service: Pulmonary;;   CATARACT EXTRACTION W/PHACO Right 11/06/2013   Procedure:  CATARACT EXTRACTION PHACO AND INTRAOCULAR LENS PLACEMENT (IOC);  Surgeon: Gemma Payor, MD;  Location: [facility];  Service: Ophthalmology;  Laterality: Right;  CDE 11.67   CATARACT EXTRACTION W/PHACO Left 11/25/2015   Procedure: CATARACT EXTRACTION PHACO AND INTRAOCULAR LENS PLACEMENT ; CDE:  8.15;  Surgeon: Gemma Payor, MD;  Location: [facility];  Service: Ophthalmology;  Laterality: Left;   COLONOSCOPY  10/03/2006   UYQ:[id number] rectal polyps cold biopsied/removed, otherwise normal rectum/Sigmoid diverticula.  Remainder of colon mucosa appeared normal   COLONOSCOPY N/A 08/19/2014   RMR: Radiation proctitis-status post APC ablation. Colonic diverticulosis.   CYSTOSCOPY  02/28/2012   Procedure: CYSTOSCOPY;  Surgeon: Valetta Fuller, MD;  Location: [facility];  Service: Urology;  Laterality: N/A;   no seeds noted in bladder   RADIOACTIVE SEED IMPLANT  02/28/2012   Procedure: RADIOACTIVE SEED IMPLANT;  Surgeon: Valetta Fuller, MD;  [facility];  Service: Urology;  Laterality: N/A;  67seeds implanted      FAMILY HISTORY:  Family History  Problem Relation Age of Onset   Stroke Mother    Hypertension Mother    Colon cancer Sister        in her 32s   Breast cancer Sister    COPD Sister    Hypertension Sister    Pneumonia Sister    Heart attack Brother    COPD Son    COPD Maternal Aunt    Cancer Neg Hx    Liver disease Neg Hx      SOCIAL HISTORY:  reports that he quit smoking about 32 years ago. His smoking use included cigarettes. He started smoking about 67 years ago. He has a 70 pack-year smoking history. His smokeless tobacco use includes chew. He reports current alcohol use of about 14.0 standard drinks of alcohol per week. He reports that he does not use drugs. The patient is widowed and lives in [place]. He lives near his daughter and enjoys being active as much as he can.    ALLERGIES: Patient has no known allergies.   MEDICATIONS:  Current  Outpatient Medications  Medication Sig Dispense Refill   acetaminophen (TYLENOL) 500 MG tablet Take 2 tablets (1,000 mg total) by mouth every 6 (six) hours as needed. 30 tablet 0   Alprostadil (PROSTAGLANDIN E1) POWD Inject 30 mcg as directed daily as needed. 1 g 5   AMBULATORY NON FORMULARY MEDICATION Medication Name: pge20/pap30/phe1 inject 0.5-8ml PRN 5 Doses/Fill 11   amLODipine (NORVASC) 2.5 MG tablet Take 2.5 mg by mouth daily.     amoxicillin-clavulanate (AUGMENTIN) 875-125 MG tablet Take 1 tablet by mouth 2 (two) times daily. 10 tablet 0   APPLE CIDER VINEGAR PO Take 450 mg by mouth daily.     b complex vitamins capsule Take 1 capsule by mouth daily.     escitalopram (LEXAPRO) 5 MG tablet Take 5 mg by mouth daily.     Glycopyrrolate-Formoterol (BEVESPI AEROSPHERE) 9-4.8 MCG/ACT AERO Inhale 2 puffs into the lungs 2 (two) times daily. 3 each 3   Magnesium 250  MG TABS Take 1 tablet by mouth daily.     metoprolol succinate (TOPROL XL) 25 MG 24 hr tablet Take 1 tablet (25 mg total) by mouth daily. 30 tablet 11   milk thistle 175 MG tablet Take 2,000 mg by mouth daily.     olopatadine (PATADAY) 0.1 % ophthalmic solution Place 1 drop into both eyes 2 (two) times daily. 5 mL 1   omeprazole (PRILOSEC) 20 MG capsule Take 20 mg by mouth daily.     rivaroxaban (XARELTO) 20 MG TABS tablet Take 1 tablet (20 mg total) by mouth daily with supper. 90 tablet 4   simvastatin (ZOCOR) 40 MG tablet Take 40 mg by mouth every other day.     solifenacin (VESICARE) 10 MG tablet Take 10 mg by mouth daily.      tamsulosin (FLOMAX) 0.4 MG CAPS capsule Take 0.4 mg by mouth daily.     Current Facility-Administered Medications  Medication Dose Route Frequency Provider Last Rate Last Admin   albuterol (PROVENTIL) (2.5 MG/3ML) 0.083% nebulizer solution 2.5 mg  2.5 mg Nebulization Once Parrett, Tammy S, NP         REVIEW OF SYSTEMS: On review of systems, the patient reports that he is doing okay overall. He feels  like his breathing has worsened gradually over the last two years but he is mostly short of breath with exertion. He denies any chest pain. He has had some increased mucous production in the last month or so and reports he has occasional hemoptysis which is somewhat of his baseline but he denies any significant changes in the amount of blood he's having and has an upcoming appointment with his PCP in a few weeks and believes he will be having labs that day. He is not having any symptoms of respiratory infection per report. No other complaints are verbalized.    PHYSICAL EXAM:  Unable to assess due to encounter type.    ECOG = 1  0 - Asymptomatic (Fully active, able to carry on all predisease activities without restriction)  1 - Symptomatic but completely ambulatory (Restricted in physically strenuous activity but ambulatory and able to carry out work of a light or sedentary nature. For example, light housework, office work)  2 - Symptomatic, <50% in bed during the day (Ambulatory and capable of all self care but unable to carry out any work activities. Up and about more than 50% of waking hours)  3 - Symptomatic, >50% in bed, but not bedbound (Capable of only limited self-care, confined to bed or chair 50% or more of waking hours)  4 - Bedbound (Completely disabled. Cannot carry on any self-care. Totally confined to bed or chair)  5 - Death   Santiago Glad MM, Creech RH, Tormey DC, et al. [phone]). "Toxicity and response criteria of the [facility]". Am. Evlyn Clines. Oncol. 5 (6): 649-55    LABORATORY DATA:  Lab Results  Component Value Date   WBC 8.2 10/24/2022   HGB 16.3 10/24/2022   HCT 47.5 10/24/2022   MCV 95.8 10/24/2022   PLT 159 10/24/2022   Lab Results  Component Value Date   NA 138 10/24/2022   K 3.9 10/24/2022   CL 103 10/24/2022   CO2 22 10/24/2022   Lab Results  Component Value Date   ALT 62 (H) 03/08/2022   AST 76 (H) 03/08/2022   ALKPHOS 67  07/18/2021   BILITOT 1.2 03/08/2022      RADIOGRAPHY: CT CHEST W CONTRAST  Result  Date: 10/20/2023 CLINICAL DATA:  Non-small cell lung cancer (NSCLC), monitor S/P SBRT. * Tracking Code: BO * EXAM: CT CHEST WITH CONTRAST TECHNIQUE: Multidetector CT imaging of the chest was performed during intravenous contrast administration. RADIATION DOSE REDUCTION: This exam was performed according to the departmental dose-optimization program which includes automated exposure control, adjustment of the mA and/or kV according to patient size and/or use of iterative reconstruction technique. CONTRAST:  75mL OMNIPAQUE IOHEXOL 300 MG/ML  SOLN COMPARISON:  03/16/2023 chest CT FINDINGS: Cardiovascular: Normal heart size. No significant pericardial effusion/thickening. Three-vessel coronary atherosclerosis. 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 Normal caliber pulmonary arteries. No central pulmonary emboli. Mediastinum/Nodes: No significant thyroid nodules. Unremarkable esophagus. No axillary adenopathy. Newly enlarged 1.4 cm right paratracheal node (series 2/image 66). No pathologically enlarged hilar nodes. Lungs/Pleura: No pneumothorax. No pleural effusion. 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 Solid peripheral left upper lobe 1.6 x 1.1 cm pulmonary nodule (series 3/image 54), mildly decreased from 1.9 x 1.3 cm. New patchy somewhat sharply marginated consolidation in the anterior left upper lobe surrounding the treated nodule, compatible with evolving postradiation change. Indistinct 0.5 cm peripheral right upper lobe nodule (series 3/image 54) is stable. No new significant pulmonary nodules. Upper abdomen: Small hiatal hernia. Musculoskeletal: No aggressive appearing focal osseous lesions. Superficial subcutaneous low-attenuation 2.2 cm ventral left chest wall lesion (series 2/image 65), mildly decreased, compatible with a sebaceous  cyst. Mild thoracic spondylosis. IMPRESSION: 1. Mild decrease in size of treated solid peripheral left upper lobe 1.6 cm pulmonary nodule. New patchy somewhat sharply marginated consolidation in the anterior left upper lobe surrounding the treated nodule, compatible with evolving postradiation change. 2. Newly enlarged 1.4 cm right paratracheal node, indeterminate for nodal metastasis versus reactive node. Management options include short-term chest CT follow-up in 3 months versus further evaluation with PET-CT at this time versus tissue sampling, as clinically warranted. 3. Three-vessel coronary atherosclerosis. 4. 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 Ascending thoracic aortic aneurysm. Recommend semi-annual imaging followup by CTA or MRA and referral to cardiothoracic surgery if not already obtained. This recommendation follows 2010 ACCF/AHA/AATS/ACR/ASA/SCA/SCAI/SIR/STS/SVM Guidelines for the Diagnosis and Management of Patients With Thoracic Aortic Disease. Circulation. 2010; 121: P329-J188. Aortic aneurysm NOS (ICD10-I71.9). 5. Small hiatal hernia. 6. 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 Electronically Signed   By: Delbert Phenix M.D.   On: 10/20/2023 23:35       IMPRESSION/PLAN: 1.  Putative Stage IA3, cT1cN0M0, NSCLC of the LUL. The patient's recent imaging was reviewed and we discussed continued surveillance in 3 months time given the right paratracheal node seen on this interval scan. He is in agreement with this plan and will call sooner with questions or concerns. 2. COPD and sleep apnea. The patient will continue to follow up with pulmonary medicine and we appreciate their input for his care as well given his symptoms and known hemoptysis. 3. Prostate Cancer. The patient is followed in surveillance with Dr. Retta Diones and we will follow this expectantly.    This encounter was conducted via telephone.  The patient has provided two factor identification and has  given verbal consent for this type of encounter and has been advised to only accept a meeting of this type in a secure network environment. The time spent during this encounter was 35 minutes including preparation, discussion, and coordination of the patient's care. The attendants for this meeting include  Ronny Bacon  and [name].  During the encounter,  Ronny Bacon was located remotely at home. Jonathan Greene was located at home.      **Disclaimer: This note was dictated with voice recognition software. Similar sounding words can inadvertently be transcribed and this note may contain transcription errors which may not have been corrected upon publication of note.**

## 2023-10-22 NOTE — Progress Notes (Signed)
Telephone nursing appointment for review of most  recent CT of Chest w/ Contrast. I verified patient's identity x2 and began nursing interview.   Patient reports occasionally coughing up light pink colored sputum. Patient denies any other related issues at this time.   Meaningful use complete.   Patient aware of their 3:30pm telephone appointment w/ Laurence Aly PA-C. I left my extension [phone] in case patient needs anything. Patient verbalized understanding. This concludes the nursing interview.   Patient contact [phone]     Ruel Favors, LPN

## 2023-10-24 ENCOUNTER — Encounter: Payer: Self-pay | Admitting: Radiation Oncology

## 2023-10-26 ENCOUNTER — Ambulatory Visit: Payer: Medicare Other | Admitting: Professional Counselor

## 2023-10-26 ENCOUNTER — Encounter: Payer: Self-pay | Admitting: Professional Counselor

## 2023-10-26 DIAGNOSIS — F321 Major depressive disorder, single episode, moderate: Secondary | ICD-10-CM | POA: Diagnosis not present

## 2023-10-26 DIAGNOSIS — F411 Generalized anxiety disorder: Secondary | ICD-10-CM | POA: Diagnosis not present

## 2023-10-26 NOTE — Progress Notes (Signed)
      [facility] Counselor/Therapist Progress Note  Patient ID: Jonathan Greene, MRN: [id number],    Date: 11/02/2023  Time Spent: 3:14 PM to 4:16 PM  Treatment Type: Individual Therapy  Reported Symptoms: Tearfulness, sadness, phase of life concerns, worries, stress, interpersonal concerns, health concerns, grief/loss  Mental Status Exam:  Appearance:   Neat     Behavior:  Appropriate, Sharing, and Motivated  Motor:  Normal  Speech/Language:   Clear and Coherent and Normal Rate  Affect:  Appropriate and Congruent  Mood:  Tearful  Thought process:  normal  Thought content:    WNL  Sensory/Perceptual disturbances:    WNL  Orientation:  Sound  Attention:  Good  Concentration:  Good  Memory:  WNL  Fund of knowledge:   Good  Insight:    Good  Judgment:   Good  Impulse Control:  Good   Risk Assessment: Danger to Self:  No Self-injurious Behavior: No  Danger to Others: No Duty to Warn:no Physical Aggression / Violence:No  Access to Firearms a concern: No  Gang Involvement:No   Subjective: Patient presented to session to address concerns of anxiety and depression.  He voiced progress since last session.  He voiced having cut back on his drinking and smoking considerably, measured by about a third for each, and he voiced pulling back by about a half on his Internet engagement of concern.  He voiced therapy as helpful to talk through his concerns.  Counselor and patient discussed patient treatment plan, and patient gave his consent.  Counselor and patient continued to build rapport.  Patient engaged in process work; he expressed ongoing sadness in regards to estrangement from his daughter and concerns about upcoming holiday.  He reported having made a financial gesture to his grandson, and shared with counselor about a special song he and daughter danced with at her wedding.  Counselor and patient listened to song together, and patient emoted and expressed his feelings of  nostalgia, tenderness, longing and regret.  Patient and counselor discussed patient possibly reaching out to daughter with apology and hope for reconciliation, and intention for improvement.  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  Patient discussed his health concerns and report from Dr. that his cancer tumor is shrinking.  Counselor actively listened, affirmed patient, worked to instill hope for patient concerns, and helped develop strategies for conflict resolution.  Interventions: Solution-Oriented/Positive Psychology, Humanistic/Existential, and Insight-Oriented  Diagnosis:   ICD-10-CM   1. Generalized anxiety disorder  F41.1     2. Current moderate episode of major depressive disorder without prior episode (HCC)  F32.1       Plan: Patient is scheduled for follow-up; continue process work and developing coping skills.  Patient to address short-term goals of conflict resolution implementation, and ongoing reduction of harmful lifestyle choices.  Gaspar Bidding, [facility]

## 2023-10-30 ENCOUNTER — Ambulatory Visit: Payer: Medicare Other | Admitting: Urology

## 2023-11-09 ENCOUNTER — Ambulatory Visit: Payer: Medicare Other | Admitting: Professional Counselor

## 2023-11-16 ENCOUNTER — Ambulatory Visit: Payer: Medicare Other | Admitting: Professional Counselor

## 2023-11-16 ENCOUNTER — Encounter: Payer: Self-pay | Admitting: Professional Counselor

## 2023-11-16 DIAGNOSIS — F331 Major depressive disorder, recurrent, moderate: Secondary | ICD-10-CM | POA: Diagnosis not present

## 2023-11-16 DIAGNOSIS — F411 Generalized anxiety disorder: Secondary | ICD-10-CM

## 2023-11-16 NOTE — Progress Notes (Signed)
°      [facility] Counselor/Therapist Progress Note  Patient ID: Jonathan Greene, MRN: [id number],    Date: 11/16/2023  Time Spent: 1:09 PM to 2:05 PM  Treatment Type: Individual Therapy  Reported Symptoms: worries, stress, loneliness, risky behavior, interpersonal concerns; somatic pain due to physical concerns (hip)  Mental Status Exam:  Appearance:   Neat     Behavior:  Appropriate, Sharing, and Motivated  Motor:  Normal  Speech/Language:   Clear and Coherent and Normal Rate  Affect:  Appropriate and Congruent  Mood:  normal  Thought process:  normal  Thought content:    WNL  Sensory/Perceptual disturbances:    WNL  Orientation:  Sound  Attention:  Good  Concentration:  Good  Memory:  WNL  Fund of knowledge:   Good  Insight:    Good  Judgment:   Good  Impulse Control:  Good   Risk Assessment: Danger to Self:  No Self-injurious Behavior: No Danger to Others: No Duty to Warn:no Physical Aggression / Violence:No  Access to Firearms a concern: No  Gang Involvement:No   Subjective: Patient presented to session to address concerns of anxiety and depression.  He reported mixed progress at this time.  He voiced experiencing pain in his hip, and that he is seeking medical care.  Patient addressed reconciliation with his family per strategies discussed last session, and reported positive results.  He identified having enjoyed Thanksgiving with his family and to feel happy regarding reconnection.  Patient also reported limiting his alcohol use, and continuing to observe important safety protocol around his Internet engagement and personal lifestyle choices.  Counselor affirmed patient proactive developments.  Patient continued to process experience of risky behavior in his personal life.  He processed how he feels that his impulses build up, his loneliness increases, and he lapses into these behaviors.  Counselor and patient discussed strategies for alleviating this pattern such as  increasing positive social ties through church going and family time, and with friends and neighbors, and the possibility of getting a dog as a companion. Patient voiced missing a pet dog he used to have, and voiced that a new pet would likely be helpful in symptom mitigation.  Patient identified short-term goals of a plan for spending Christmas with his family, to continue to limit alcohol use, and to continue safety around Internet and personal life.  Counselor and patient discussed patient having conversation with daughter in the new year about his dating intentions, and receiving her help around dating websites to be sure that he is protected from any fraudulent acts.  Interventions: Solution-Oriented/Positive Psychology, Humanistic/Existential, and Insight-Oriented, Aeronautical engineer, Resourcing  Diagnosis:   ICD-10-CM   1. Generalized anxiety disorder  F41.1     2. Major depressive disorder, recurrent episode, moderate (HCC)  F33.1       Plan: Patient is scheduled for follow-up; continue process work and developing coping skills.  Patient to continue to work on short-term goal of increasing holistic safety measures and self-care, limiting alcohol use, continuing reconciliation efforts with family, and increasing positive social ties.  Gaspar Bidding, [facility]

## 2023-11-23 DIAGNOSIS — M25552 Pain in left hip: Secondary | ICD-10-CM | POA: Insufficient documentation

## 2023-12-10 NOTE — Progress Notes (Signed)
 History of Present Illness: Pt here for continued mgmt of ED. He is on trimix.  He is on very high dose Trimix, and a couple of months ago after injecting and during sex, he had early detumescence but he noted subcutaneous swelling around the place of injection.  No significant pain.  He has not injected since then.  Past Medical History:  Diagnosis Date   A-fib [facility])    Arthritis    right hand   Bradycardia    COPD (chronic obstructive pulmonary disease) (HCC)    Dyspnea    Dysrhythmia    Frequent urination    GERD (gastroesophageal reflux disease)    Hypercholesterolemia    Hypertension    Pneumonia    Prostate cancer (HCC)    RADIATION TX AND SCHEDULED FOR SEED IMPLANTS 02-28-12   Sleep apnea    uses a cpap   Stroke (HCC)    2011;ST memory loss, no other deficits.   Urinary urgency     Past Surgical History:  Procedure Laterality Date   BRONCHIAL BIOPSY  10/24/2022   Procedure: BRONCHIAL BIOPSIES;  Surgeon: Brenna Adine CROME, DO;  Location: [facility];  Service: Pulmonary;;   BRONCHIAL NEEDLE ASPIRATION BIOPSY  10/24/2022   Procedure: BRONCHIAL NEEDLE ASPIRATION BIOPSIES;  Surgeon: Brenna Adine CROME, DO;  Location: [facility];  Service: Pulmonary;;   BRONCHIAL WASHINGS  10/24/2022   Procedure: BRONCHIAL WASHINGS;  Surgeon: Brenna Adine CROME, DO;  Location: [facility];  Service: Pulmonary;;   CATARACT EXTRACTION W/PHACO Right 11/06/2013   Procedure: CATARACT EXTRACTION PHACO AND INTRAOCULAR LENS PLACEMENT (IOC);  Surgeon: Cherene Mania, MD;  Location: [facility];  Service: Ophthalmology;  Laterality: Right;  CDE 11.67   CATARACT EXTRACTION W/PHACO Left 11/25/2015   Procedure: CATARACT EXTRACTION PHACO AND INTRAOCULAR LENS PLACEMENT ; CDE:  8.15;  Surgeon: Cherene Mania, MD;  Location: [facility];  Service: Ophthalmology;  Laterality: Left;   COLONOSCOPY  10/03/2006   MFM:Ipfpwlupcz rectal polyps cold biopsied/removed, otherwise normal rectum/Sigmoid diverticula.  Remainder of colon mucosa  appeared normal   COLONOSCOPY N/A 08/19/2014   RMR: Radiation proctitis-status post APC ablation. Colonic diverticulosis.   CYSTOSCOPY  02/28/2012   Procedure: CYSTOSCOPY;  Surgeon: Alm GORMAN Fragmin, MD;  Location: [facility];  Service: Urology;  Laterality: N/A;   no seeds noted in bladder   RADIOACTIVE SEED IMPLANT  02/28/2012   Procedure: RADIOACTIVE SEED IMPLANT;  Surgeon: Alm GORMAN Fragmin, MD;  Location: [facility];  Service: Urology;  Laterality: N/A;  67seeds implanted     Home Medications:  Allergies as of 12/11/2023   No Known Allergies      Medication List        Accurate as of December 10, 2023 11:07 AM. If you have any questions, ask your nurse or doctor.          acetaminophen  500 MG tablet Commonly known as: TYLENOL  Take 2 tablets (1,000 mg total) by mouth every 6 (six) hours as needed.   AMBULATORY NON FORMULARY MEDICATION Medication Name: pge20/pap30/phe1 inject 0.5-1ml PRN   amLODipine  2.5 MG tablet Commonly known as: NORVASC  Take 2.5 mg by mouth daily.   amoxicillin -clavulanate 875-125 MG tablet Commonly known as: AUGMENTIN  Take 1 tablet by mouth 2 (two) times daily.   APPLE CIDER VINEGAR PO Take 450 mg by mouth daily.   b complex vitamins capsule Take 1 capsule by mouth daily.   Bevespi  Aerosphere 9-4.8 MCG/ACT Aero Generic drug: Glycopyrrolate -Formoterol  Inhale 2 puffs into the lungs 2 (  two) times daily.   escitalopram  5 MG tablet Commonly known as: LEXAPRO  Take 5 mg by mouth daily.   Magnesium 250 MG Tabs Take 1 tablet by mouth daily.   metoprolol  succinate 25 MG 24 hr tablet Commonly known as: Toprol  XL Take 1 tablet (25 mg total) by mouth daily.   milk thistle 175 MG tablet Take 2,000 mg by mouth daily.   olopatadine  0.1 % ophthalmic solution Commonly known as: Pataday  Place 1 drop into both eyes 2 (two) times daily.   omeprazole 20 MG capsule Commonly known as: PRILOSEC Take 20 mg by mouth daily.    Prostaglandin E1 Powd Inject 30 mcg as directed daily as needed.   rivaroxaban  20 MG Tabs tablet Commonly known as: XARELTO  Take 1 tablet (20 mg total) by mouth daily with supper.   simvastatin  40 MG tablet Commonly known as: ZOCOR  Take 40 mg by mouth every other day.   solifenacin 10 MG tablet Commonly known as: VESICARE Take 10 mg by mouth daily.   tamsulosin  0.4 MG Caps capsule Commonly known as: FLOMAX  Take 0.4 mg by mouth daily.        Allergies: No Known Allergies  Family History  Problem Relation Age of Onset   Stroke Mother    Hypertension Mother    Colon cancer Sister        in her 87s   Breast cancer Sister    COPD Sister    Hypertension Sister    Pneumonia Sister    Heart attack Brother    COPD Son    COPD Maternal Aunt    Cancer Neg Hx    Liver disease Neg Hx     Social History:  reports that he quit smoking about 32 years ago. His smoking use included cigarettes. He started smoking about 67 years ago. He has a 70 pack-year smoking history. His smokeless tobacco use includes chew. He reports current alcohol use of about 14.0 standard drinks of alcohol per week. He reports that he does not use drugs.  ROS: A complete review of systems was performed.  All systems are negative except for pertinent findings as noted.  Physical Exam:  Vital signs in last 24 hours: There were no vitals taken for this visit. Constitutional:  Alert and oriented, No acute distress Cardiovascular: Regular rate  Respiratory: Normal respiratory effort Neurologic: Grossly intact, no focal deficits Psychiatric: Normal mood and affect  I have reviewed prior pt notes  I have reviewed urinalysis results  I have reviewed prior PSA/pathology results     Impression/Assessment:  History of prostate cancer, no evidence of recurrence  ED--so far, not entirely successful with basically maximal concentrations of Trimix.  I wonder whether he had an adequate injection with his  edema afterwards  Plan:  I did instruct him to make sure he inserts the needle on the way with injections, hold pressure at injection site afterwards  I gave him information on vacuum erection devices  Recheck here in about 3 months I did recommend that he stop smoking-he continues with this behavior.

## 2023-12-11 ENCOUNTER — Ambulatory Visit: Payer: Medicare Other | Admitting: Urology

## 2023-12-11 ENCOUNTER — Encounter: Payer: Self-pay | Admitting: Urology

## 2023-12-11 VITALS — BP 155/91 | HR 76

## 2023-12-11 DIAGNOSIS — N5201 Erectile dysfunction due to arterial insufficiency: Secondary | ICD-10-CM | POA: Diagnosis not present

## 2023-12-11 DIAGNOSIS — Z8546 Personal history of malignant neoplasm of prostate: Secondary | ICD-10-CM

## 2023-12-11 LAB — URINALYSIS, ROUTINE W REFLEX MICROSCOPIC
Bilirubin, UA: NEGATIVE
Glucose, UA: NEGATIVE
Ketones, UA: NEGATIVE
Leukocytes,UA: NEGATIVE
Nitrite, UA: NEGATIVE
Protein,UA: NEGATIVE
RBC, UA: NEGATIVE
Specific Gravity, UA: 1.02 (ref 1.005–1.030)
Urobilinogen, Ur: 4 mg/dL — ABNORMAL HIGH (ref 0.2–1.0)
pH, UA: 6 (ref 5.0–7.5)

## 2023-12-14 ENCOUNTER — Ambulatory Visit: Payer: Medicare Other | Admitting: Professional Counselor

## 2023-12-14 ENCOUNTER — Encounter: Payer: Self-pay | Admitting: Professional Counselor

## 2023-12-14 DIAGNOSIS — F411 Generalized anxiety disorder: Secondary | ICD-10-CM

## 2023-12-14 DIAGNOSIS — F330 Major depressive disorder, recurrent, mild: Secondary | ICD-10-CM

## 2023-12-14 NOTE — Progress Notes (Signed)
      [facility] Counselor/Therapist Progress Note  Patient ID: Jonathan Greene, MRN: [id number],    Date: 12/14/2023  Time Spent: 10:16 AM to 11:09 AM  Treatment Type: Individual Therapy  Reported Symptoms: Worries, loneliness, grief/loss, risky behavior, health concerns, phase of life concerns  Mental Status Exam:  Appearance:   Casual     Behavior:  Appropriate, Sharing, and Motivated  Motor:  Normal  Speech/Language:   Clear and Coherent and Normal Rate  Affect:  Appropriate and Congruent  Mood:  normal  Thought process:  normal  Thought content:    WNL  Sensory/Perceptual disturbances:    WNL  Orientation:  oriented to person, place, time/date, and situation  Attention:  Good  Concentration:  Good  Memory:  WNL  Fund of knowledge:   Good  Insight:    Good  Judgment:   Good  Impulse Control:  Good   Risk Assessment: Danger to Self:  No Self-injurious Behavior: No Danger to Others: No Duty to Warn:no Physical Aggression / Violence:No  Access to Firearms a concern: No  Gang Involvement:No   Subjective: Patient presented to session with concerns of anxiety and depression.  Patient reported progress. He reported the holidays as having gone well, as he spent them with family and he reported his online engagement of concern to have decreased.  He also voiced his drinking to have decreased.  Patient voiced therapy as having been helpful to him in reconciling him with his family and making changes in his life.  Counselor affirmed patient efforts toward progress, and taking care of himself.  Patient reflected on his life and particularly loved ones he has lost, and how he hopes to see them in heaven, and what he wishes to talk to them about.  He reflected on his thoughts of making amends.  Patient reflected on his health concerns including arthritis in his hip that is causing him pain.  Counselor actively listened and held space for patient grief and loss and spiritual  orientation.  Interventions: Solution-Oriented/Positive Psychology, Humanistic/Existential, and Insight-Oriented  Diagnosis:   ICD-10-CM   1. Generalized anxiety disorder  F41.1     2. Major depressive disorder, recurrent episode, mild (HCC)  F33.0       Plan: Patient is scheduled for a follow-up; continue process work and developing coping skills.  Patient short-term goal between sessions to work on profile for dating website for older adults with his daughter, to make intention for regular church-going, and to continue self care objectives such as limiting drinking and limiting unhealthy engagements.  Progress note was dictated with Dragon and reviewed for accuracy.  Almarie ONEIDA Sprang, [facility]

## 2024-01-01 DIAGNOSIS — C434 Malignant melanoma of scalp and neck: Secondary | ICD-10-CM | POA: Insufficient documentation

## 2024-01-08 ENCOUNTER — Ambulatory Visit: Payer: Medicare Other | Admitting: Urology

## 2024-01-11 DIAGNOSIS — M1612 Unilateral primary osteoarthritis, left hip: Secondary | ICD-10-CM | POA: Insufficient documentation

## 2024-01-14 ENCOUNTER — Telehealth: Payer: Self-pay | Admitting: *Deleted

## 2024-01-14 ENCOUNTER — Ambulatory Visit: Payer: Medicare Other | Admitting: Professional Counselor

## 2024-01-14 NOTE — Telephone Encounter (Signed)
 CALLED PATIENT TO INFORM OF CT FOR 01-22-24- ARRIVAL TIME- 9:30 AM, NO RESTRICTIONS TO SCAN, SCAN TO BE AT [facility],  PATIENT TO RECEIVE RESULTS FROM ALISON PERKINS ON 01/28/24 @ 2:30 PM VIA TELEPHONE , LVM FOR A RETURN CALL

## 2024-01-15 ENCOUNTER — Encounter: Payer: Self-pay | Admitting: Cardiology

## 2024-01-16 ENCOUNTER — Telehealth: Payer: Self-pay | Admitting: Cardiology

## 2024-01-16 NOTE — Telephone Encounter (Signed)
Pt c/o medication issue:  1. Name of Medication:   rivaroxaban (XARELTO) 20 MG TABS tablet   2. How are you currently taking this medication (dosage and times per day)?   Not taking  3. Are you having a reaction (difficulty breathing--STAT)?   4. What is your medication issue?   Daughter Scherry Ran) stated patient recently had surgery and this medication was stopped for 5 days.  Daughter stated patient is now on Day 2 of holding this medication and is concerned if this is OK.

## 2024-01-17 NOTE — Telephone Encounter (Signed)
Left a message for patient to call office regarding medication restart of Xarelto.

## 2024-01-17 NOTE — Telephone Encounter (Signed)
Left message for daughter that father may resume Xarelto tomorrow, 01/18/24 as long as thee is no drainage coming from wound site. I gave her our number to return call if there are further questions.

## 2024-01-18 NOTE — Telephone Encounter (Signed)
Ok to old the xarelto as needed for the procedure, the risk of holding that few of days is low particularly compared to the risk of bleeding if it were started too early  Dominga Ferry MD

## 2024-01-22 ENCOUNTER — Ambulatory Visit (HOSPITAL_COMMUNITY): Payer: Medicare Other

## 2024-01-24 ENCOUNTER — Ambulatory Visit: Payer: Medicare Other | Admitting: Radiation Oncology

## 2024-01-28 ENCOUNTER — Ambulatory Visit: Payer: Medicare Other | Admitting: Radiation Oncology

## 2024-01-31 ENCOUNTER — Telehealth: Payer: Self-pay | Admitting: *Deleted

## 2024-01-31 ENCOUNTER — Encounter (HOSPITAL_BASED_OUTPATIENT_CLINIC_OR_DEPARTMENT_OTHER): Payer: Self-pay

## 2024-01-31 ENCOUNTER — Encounter: Payer: Self-pay | Admitting: Cardiology

## 2024-01-31 NOTE — Telephone Encounter (Signed)
 Yes will need office visit in next few week , routine ov , can see if he can establish with MD as Dr. Craige Cotta  is no longer here if not can put on my schedule at first available slot

## 2024-01-31 NOTE — Telephone Encounter (Signed)
 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

## 2024-01-31 NOTE — Telephone Encounter (Signed)
 Patient with diagnosis of A Fib on Xarelto for anticoagulation.    Procedure: LEFT TOTAL HIP ARTHROPLASTY  Date of procedure: 03/25/24   CHA2DS2-VASc Score = 6  This indicates a 9.7% annual risk of stroke. The patient's score is based upon: CHF History: 0 HTN History: 1 Diabetes History: 1 Stroke History: 2 Vascular Disease History: 0 Age Score: 2 Gender Score: 0    CrCl 73 ml/min Platelet count 161K  Recommend holding for 3 days but due to history of CVA, will route to Dr Wyline Mood for input   **This guidance is not considered finalized until pre-operative APP has relayed final recommendations.**

## 2024-01-31 NOTE — Telephone Encounter (Signed)
   Pre-operative Risk Assessment    Patient Name: Jonathan Greene  DOB: 08/24/1941 MRN: [id number]   Date of last office visit: 06/20/23 Date of next office visit: N/A   Request for Surgical Clearance    Procedure:   LEFT TOTAL HIP ARTHROPLASTY  Date of Surgery:  Clearance 03/25/24                                Surgeon:  DR. MATTHEW OLIN Surgeon's Group or Practice Name:  [facility] Phone number:  [phone] Fax number:  [phone]   Type of Clearance Requested:   - Medical  - Pharmacy:  Hold Rivaroxaban (Xarelto) NOT INDICATED   Type of Anesthesia:  Spinal   Additional requests/questions:    Wilhemina Cash   01/31/2024, 10:07 AM

## 2024-02-01 ENCOUNTER — Telehealth: Payer: Self-pay | Admitting: Internal Medicine

## 2024-02-01 NOTE — Telephone Encounter (Signed)
 Appt scheduled with Dr Sherene Sires in [place] 03-04-24

## 2024-02-01 NOTE — Telephone Encounter (Signed)
 Fax received from Dr. Durene Romans with [facility] to perform a left total hip arthoplasty under spinal anesthesia on patient.  Patient needs surgery clearance. Surgery is 03/25/24. Patient was seen on 07/04/23. Office protocol is a risk assessment can be sent to surgeon if patient has been seen in 60 days or less.   I spoke with the pt and scheduled him for appt with Dr Sherene Sires for 03/04/24  Routing to clearance pool for f/u

## 2024-02-02 DIAGNOSIS — M87052 Idiopathic aseptic necrosis of left femur: Secondary | ICD-10-CM | POA: Insufficient documentation

## 2024-02-04 NOTE — Telephone Encounter (Signed)
 Ok to hold xarelto for 3 days  Dominga Ferry MD

## 2024-02-04 NOTE — Telephone Encounter (Signed)
   Name: Jonathan Greene  DOB: 02/22/1941  MRN: [id number]  Primary Cardiologist: Dina Rich, MD   Preoperative team, please contact this patient and set up a phone call appointment for further preoperative risk assessment. Please obtain consent and complete medication review. Thank you for your help.  I confirm that guidance regarding antiplatelet and oral anticoagulation therapy has been completed.  Per Pharm D: Recommend holding for 3 days. This recommendation was verified/given okay by Dr. Wyline Mood.   I also confirmed the patient resides in the state of [place]. As per [facility] Medical Board telemedicine laws, the patient must reside in the state in which the provider is licensed.   Perlie Gold, PA-C 02/04/2024, 2:39 PM [facility]

## 2024-02-04 NOTE — Telephone Encounter (Signed)
 Pt has in office appt 02/07/24 with [name], NP. I will update the preop APP as FYI. Will update NP who will be seeing the pt and the surgeon (Dr. Charlann Boxer).

## 2024-02-05 ENCOUNTER — Ambulatory Visit: Payer: Medicare Other | Admitting: Urology

## 2024-02-05 ENCOUNTER — Ambulatory Visit (HOSPITAL_COMMUNITY)
Admission: RE | Admit: 2024-02-05 | Discharge: 2024-02-05 | Disposition: A | Discharge status: Home / Self Care | Payer: Medicare Other | Source: Ambulatory Visit | Attending: Radiation Oncology | Admitting: Radiation Oncology

## 2024-02-05 DIAGNOSIS — C3412 Malignant neoplasm of upper lobe, left bronchus or lung: Secondary | ICD-10-CM | POA: Insufficient documentation

## 2024-02-05 LAB — POCT I-STAT CREATININE: Creatinine, Ser: 1.2 mg/dL (ref 0.61–1.24)

## 2024-02-05 MED ORDER — IOHEXOL 300 MG/ML  SOLN
75.0000 mL | Freq: Once | INTRAMUSCULAR | Status: AC | PRN
  Administered 2024-02-05: 75 mL via INTRAVENOUS

## 2024-02-07 ENCOUNTER — Encounter: Payer: Self-pay | Admitting: Nurse Practitioner

## 2024-02-07 ENCOUNTER — Encounter (HOSPITAL_COMMUNITY): Payer: Self-pay | Admitting: Radiation Oncology

## 2024-02-07 ENCOUNTER — Ambulatory Visit: Payer: Medicare Other | Attending: Nurse Practitioner | Admitting: Nurse Practitioner

## 2024-02-07 VITALS — BP 128/80 | HR 78 | Ht 74.0 in | Wt 205.0 lb

## 2024-02-07 DIAGNOSIS — E785 Hyperlipidemia, unspecified: Secondary | ICD-10-CM | POA: Diagnosis not present

## 2024-02-07 DIAGNOSIS — N529 Male erectile dysfunction, unspecified: Secondary | ICD-10-CM

## 2024-02-07 DIAGNOSIS — I10 Essential (primary) hypertension: Secondary | ICD-10-CM

## 2024-02-07 DIAGNOSIS — Z01810 Encounter for preprocedural cardiovascular examination: Secondary | ICD-10-CM

## 2024-02-07 DIAGNOSIS — Z720 Tobacco use: Secondary | ICD-10-CM

## 2024-02-07 DIAGNOSIS — I4891 Unspecified atrial fibrillation: Secondary | ICD-10-CM

## 2024-02-07 DIAGNOSIS — I712 Thoracic aortic aneurysm, without rupture, unspecified: Secondary | ICD-10-CM

## 2024-02-07 DIAGNOSIS — J449 Chronic obstructive pulmonary disease, unspecified: Secondary | ICD-10-CM

## 2024-02-07 DIAGNOSIS — I2510 Atherosclerotic heart disease of native coronary artery without angina pectoris: Secondary | ICD-10-CM

## 2024-02-07 DIAGNOSIS — R911 Solitary pulmonary nodule: Secondary | ICD-10-CM

## 2024-02-07 MED ORDER — TADALAFIL 10 MG PO TABS: 10.0000 mg | ORAL_TABLET | Freq: Every day | ORAL | 2 refills | Status: AC | PRN

## 2024-02-07 NOTE — Patient Instructions (Addendum)

## 2024-02-07 NOTE — Progress Notes (Signed)
 Cardiology Office Note:  .   Date:  02/07/2024  ID:  Jonathan Greene, DOB Sep 18, 1941, MRN [id number] PCP: Roe Rutherford, NP  [facility] Providers Cardiologist:  Dina Rich, MD    History of Present Illness: .   Jonathan Greene is a 83 y.o. 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  History of aortic aneurysm followed by pulmonology with regular chest CTs.  Last seen by Dr. Dina Rich on June 20, 2023.  Was overall doing well at the time.  Our office was contacted for preoperative cardiovascular risk assessment.  He is scheduled for left hip total arthroplasty for surgery date on March 25, 2024.  Surgeon will be Dr. Durene Romans of [facility].  He presents today for follow-up.  He states he is doing well from a cardiac standpoint.  He is mainly wheelchair-bound, occasionally walks with a rolling walker due to his left hip.  Daughter states he has chronic DOE related to his COPD, has been stable over time. Currently smokes 1/2 - 1 pack of cigarettes per day. Working on weaning off of nicotine per his report. Does also chew tobacco. Denies any chest pain, palpitations, syncope, presyncope, dizziness, orthopnea, PND, swelling or significant weight changes, acute bleeding, or claudication.  ROS: Negative.  See HPI. SH: Designer, television/film set.   Studies Reviewed: Marland Kitchen    EKG: EKG is not ordered today.   CCTA 02/2021:  IMPRESSION: 1. Mildly dilated ascending thoracic aorta measuring up to 4.2 cm in greatest diameter. Recommend annual imaging followup by CTA or MRA.This recommendation follows 2010 ACCF/AHA/AATS/ACR/ASA/SCA/SCAI/SIR/STS/SVM Guidelines for theDiagnosis and Management of Patients with Thoracic Aortic Disease. Circulation. 2010; 121: W098-J191. Aortic  aneurysm NOS (ICD10-I71.9) 2. Thinning of the myocardium at the left ventricular apex with small apical aneurysm present measuring up to 12 mm in estimated greatest diameter. In retrospect, this had a similar appearance in 2016. 3. Emphysema. 4. Small hiatal hernia.  IMPRESSION: 1. Coronary calcium score of 2505. This was 9 percentile for age and sex matched control.   2. Normal coronary origin with right dominance.   3. CAD-RADS 4. Moderate diffuse heavily calcified plaque in the proximal RCA, proximal and mid LAD and in the proximal LCX arteries. 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 Aggressive risk factor modification is recommended.   4. There is a small a pseudoaneurysm at the inferior portion of the left ventricular apex measuring 12 x 10 mm.   5. Mild ascending aortic aneurysm with maximum diameter 42 mm. Mild diffuse atherosclerotic plaque, no dissection.   6. 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   IMPRESSION: 1. CT FFR analysis didn't show any significant stenosis in the LAD and LCX arteries. RCA was not analyzed secondary to significant motion while the patient was in atrial fibrillation with variable heart rate. Aggressive medical management is recommended.   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   Cardiac monitor 08/2020:  14 day event monitor Max HR 190, Min HR 43, Avg HR 87 Atrial firbillation throughout study Rare ventricular ectopy in the form of isolated PVCs, couplets, triplets. Reported symptoms correlated with afib with elevated heart rates.  Echo 06/2020:  1. Left ventricular ejection fraction, by estimation, is 55 to 60%. The  left ventricle has normal function. The left ventricle has no regional  wall motion abnormalities. There is moderate left ventricular hypertrophy.  Left ventricular diastolic   parameters are indeterminate in the setting of atrial fibrillation.   2. Right ventricular systolic function is normal. The right ventricular  size is normal.   3. Left atrial size was mildly dilated.   4. The mitral valve is grossly normal. Trivial mitral valve  regurgitation.   5. The aortic valve is tricuspid. Aortic valve regurgitation is not  visualized. Mild aortic valve sclerosis is present, with no evidence of  aortic valve stenosis.   6. The inferior vena cava is normal in size with greater than 50%  respiratory variability, suggesting right atrial pressure of 3 mmHg.   ETT 06/2017:  Blood pressure demonstrated a normal response to exercise. ECG had significant artifact throughout study. Submaximal stress test as inadequate heart rate achieved. Consider alternative forms of stress testing with imaging if deemed clinically indicated.  Physical Exam:   VS:  BP 128/80   Pulse 78   Ht 6\' 2"  (1.88 m)   Wt 205 lb (93 kg)   SpO2 98%   BMI 26.32 kg/m    Wt Readings from Last 3 Encounters:  02/07/24 205 lb (93 kg)  07/04/23 214 lb 3.2 oz (97.2 kg)  06/20/23 214 lb 12.8 oz (97.4 kg)    GEN: Well nourished, well developed in no acute distress, sitting comfortably in wheelchair. NECK: No JVD; No carotid bruits CARDIAC: S1/S2, irregularly irregular rhythm, no murmurs, rubs, gallops RESPIRATORY:  Clear and diminished to auscultation without rales, wheezing or rhonchi  ABDOMEN: Soft, non-tender, non-distended EXTREMITIES:  No edema; No deformity   ASSESSMENT AND PLAN: .    Pre-operative cardiovascular risk assessment Jonathan Greene's perioperative risk of a major cardiac event is 6.6% according to the Revised Cardiac Risk Index (RCRI).  Therefore, he is at high risk for perioperative complications.   His functional capacity is poor at 3.63 METs according to the Duke Activity Status Index (DASI). Recommendations: The patient is at high risk for perioperative cardiac complications  and is at a low functional capacity.  However, further testing will not change how his cardiac status is managed.  Proceed with surgery at high risk if there are no other options for treatment. Antiplatelet and/or Anticoagulation Recommendations: Xarelto (Rivaroxaban) can be held for  3 days prior to surgery.  Please resume post op when felt to be safe.    2. CAD Noted on prior CT schest. CCTA in 2022 showed 3 vessel CAD, no significant stenosis noted by FFR. Stable with no anginal symptoms. No indication for ischemic evaluation.  Not on aspirin due to being on Xarelto.  Continue current medication regimen.  Heart healthy diet recommended.  3. HTN BP stable. Discussed to monitor BP at home at least 2 hours after medications and sitting for 5-10 minutes.  No medication changes at this time. Heart healthy diet and regular cardiovascular exercise encouraged.   4. HLD LDL 67 in October 2024.  Continue simvastatin.  Heart healthy diet encouraged.  5. A-fib Denies any tachycardia or palpitations.  Heart rate is well-controlled today.  Continue Toprol-XL.  Continue Xarelto for stroke prevention.  He is on appropriate dosage denies any bleeding issues.  6. Thoracic aortic aneurysm Most recent CT scan of chest in November 2024 revealed ascending thoracic aortic aneurysm at 4.5 cm.  Was recommended to continue semiannual imaging follow-up by CTA or MRI.  Just had another CT  scan of his chest performed earlier this week.  Will continue to monitor. Care and ED precautions discussed.   7.  Pulmonary nodule CT scan from November 2024 showed a mild decrease in size of treated solid peripheral left upper lobe nodule, 1.6 cm, there was new patchy somewhat sharply marginated consolidation in the anterior left upper lobe surrounding treated nodule, compatible with evolving postradiation change.  There was a newly enlarged 1.4 cm right paratracheal node, indeterminate for nodal metastasis versus reactive node.   Continue follow-up with pulmonology.  8. COPD Stable dyspnea on exertion, denies any recent acute worsening symptoms.  Continue current medication regimen.  Continue follow-up with PCP and pulmonology.  9. Erectile dysfunction  Requesting a refill of Cialis. Will provide refill. Continue to follow with PCP.  10. Tobacco abuse, chewing tobacco  Smoking cessation encouraged and discussed. Also discussed cessation of chewing tobacco.  Dispo: Follow-up with Dr. Dina Rich or APP in 6 months or sooner if any changes.  Signed, Sharlene Dory, NP

## 2024-02-11 ENCOUNTER — Ambulatory Visit: Payer: Medicare Other | Admitting: Professional Counselor

## 2024-03-02 NOTE — Progress Notes (Unsigned)
 Jonathan Greene, male    DOB: 1941/05/24    MRN: [id number]   Brief patient profile:  63  yowm  active smoker (since 2022 started back) retired Engineer, structural  former [employer] self-referred back to pulmonary clinic in [place]  03/06/2024  for pre op eval.    PFT 06/30/20 >> FEV1 3.50 (99%), Ratio 0.77, TLC 7.09 (90%), DLCO 51%    NP recs  07/04/23 Liquid Mucinex DM Twice daily  As needed cough/congestion  Continue on BEVESPI 2 puffs Twice daily  Albuterol inhaler or neb As needed  CT chest as planned in October (follow lung mass)  and follow up with Radiation Oncology as planned  Follow up with 4 months and As needed   Please contact office for sooner follow up if symptoms do not improve or worsen or seek emergency care    History of Present Illness  03/06/2024  Pulmonary/ 1st office eval/ [facility] / [place] [facility]   Cc need clearance for L THR   Dyspnea:  limited by L hip  to walker not limited by doe  Cough: mucus is slt green large vol in am mostly worse since started  back smoking  Sleep: able to lie down flat one pillow s noct resp cc  SABA use: rarely if ever  02 [id number]     No obvious day to day or daytime pattern/variability or assoc  mucus plugs or hemoptysis or cp or chest tightness, subjective wheeze or overt sinus or hb symptoms.    Also denies any obvious fluctuation of symptoms with weather or environmental changes or other aggravating or alleviating factors except as outlined above   No unusual exposure hx or h/o childhood pna/ asthma or knowledge of premature birth.  Current Allergies, Complete Past Medical History, Past Surgical History, Family History, and Social History were reviewed in [employer] record.  ROS  The following are not active complaints unless bolded Hoarseness, sore throat, dysphagia, dental problems, itching, sneezing,  nasal congestion or discharge of excess mucus or purulent secretions, ear ache,   fever, chills, sweats,  unintended wt loss or wt gain, classically pleuritic or exertional cp,  orthopnea pnd or arm/hand swelling  or leg swelling, presyncope, palpitations, abdominal pain, anorexia, nausea, vomiting, diarrhea  or change in bowel habits or change in bladder habits, change in stools or change in urine, dysuria, hematuria,  rash, arthralgias, visual complaints, headache, numbness, weakness or ataxia or problems with walking or coordination,  change in mood or  memory.            Outpatient Medications Prior to Visit  Medication Sig Dispense Refill   acetaminophen (TYLENOL) 500 MG tablet Take 2 tablets (1,000 mg total) by mouth every 6 (six) hours as needed. 30 tablet 0   albuterol (VENTOLIN HFA) 108 (90 Base) MCG/ACT inhaler Inhale 1-2 puffs into the lungs every 6 (six) hours as needed.     Alprostadil (PROSTAGLANDIN E1) POWD Inject 30 mcg as directed daily as needed. 1 g 5   AMBULATORY NON FORMULARY MEDICATION Medication Name: pge20/pap30/phe1 inject 0.5-37ml PRN 5 Doses/Fill 11   amLODipine (NORVASC) 2.5 MG tablet Take 2.5 mg by mouth daily.            APPLE CIDER VINEGAR PO Take 450 mg by mouth daily.     aspirin EC 81 MG tablet Take 81 mg by mouth daily. (Patient not taking: Reported on 02/07/2024)     b complex vitamins capsule Take 1 capsule  by mouth daily.     Cholecalciferol (VITAMIN D-1000 MAX ST) 25 MCG (1000 UT) tablet Take 1,000 Units by mouth daily.     Cyanocobalamin 1000 MCG TBCR Take by mouth.     escitalopram (LEXAPRO) 5 MG tablet Take 5 mg by mouth daily.            HYDROcodone-acetaminophen (NORCO) 10-325 MG tablet Take 1 tablet by mouth every 6 (six) hours as needed for severe pain (pain score 7-10). (Patient not taking: Reported on 02/07/2024)     HYDROcodone-acetaminophen (NORCO/VICODIN) 5-325 MG tablet Take 1 tablet by mouth every 6 (six) hours as needed for moderate pain (pain score 4-6) or severe pain (pain score 7-10). (Patient not taking: Reported on 02/07/2024)     Magnesium 250 MG  TABS Take 1 tablet by mouth daily.     metoprolol succinate (TOPROL XL) 25 MG 24 hr tablet Take 1 tablet (25 mg total) by mouth daily. 30 tablet 11   milk thistle 175 MG tablet Take 2,000 mg by mouth daily.     nicotine (NICODERM CQ - DOSED IN MG/24 HOURS) 21 mg/24hr patch Place 21 mg onto the skin daily.     olopatadine (PATADAY) 0.1 % ophthalmic solution Place 1 drop into both eyes 2 (two) times daily. 5 mL 1   omeprazole (PRILOSEC) 20 MG capsule Take 20 mg by mouth daily.     oxyCODONE (OXY IR/ROXICODONE) 5 MG immediate release tablet Take 5 mg by mouth.     rivaroxaban (XARELTO) 20 MG TABS tablet Take 1 tablet (20 mg total) by mouth daily with supper. 90 tablet 4   simvastatin (ZOCOR) 40 MG tablet Take 40 mg by mouth every other day.     solifenacin (VESICARE) 10 MG tablet Take 10 mg by mouth daily.      tadalafil (CIALIS) 10 MG tablet Take 1 tablet (10 mg total) by mouth daily as needed. 10 tablet 2   tamsulosin (FLOMAX) 0.4 MG CAPS capsule Take 0.4 mg by mouth daily.     Facility-Administered Medications Prior to Visit  Medication Dose Route Frequency Provider Last Rate Last Admin   albuterol (PROVENTIL) (2.5 MG/3ML) 0.083% nebulizer solution 2.5 mg  2.5 mg Nebulization Once Parrett, Tammy S, NP        Past Medical History:  Diagnosis Date   A-fib (HCC)    Arthritis    right hand   Bradycardia    COPD (chronic obstructive pulmonary disease) (HCC)    Dyspnea    Dysrhythmia    Frequent urination    GERD (gastroesophageal reflux disease)    Hypercholesterolemia    Hypertension    Pneumonia    Prostate cancer (HCC)    RADIATION TX AND SCHEDULED FOR SEED IMPLANTS 02-28-12   Sleep apnea    uses a cpap   Stroke (HCC)    2011;ST memory loss, no other deficits.   Urinary urgency       Objective:     BP 115/73   Pulse 88   Ht 6\' 2"  (1.88 m)   Wt 218 lb (98.9 kg)   SpO2 95%   BMI 27.99 kg/m   SpO2: 95 % RA   Pleasant wm walking with walker /mild  smoker's rattle     HEENT : Oropharynx  clear      Nasal turbinates nl    NECK :  without  apparent JVD/ palpable Nodes/TM    LUNGS: no acc muscle use,  Nl contour chest with minimal insp /  exp rhonchi bilaterally without cough on insp or exp maneuvers   CV:  RRR  no s3 or murmur or increase in P2, and no edema   ABD:  soft and nontender   MS:   ext warm without deformities  or  calf tenderness, cyanosis or clubbing    SKIN: warm and dry without lesions    NEURO:  alert, approp, nl sensorium with  no motor or cerebellar deficits apparent.    I personally reviewed images and agree with radiology impression as follows:   Chest CT chest   02/05/24  1. Treated peripheral left upper lobe solid pulmonary nodule is increasingly obscured by evolving postradiation change and appears stable in size. 2. Mildly enlarged low right pretracheal node, slightly decreased, nonspecific but favoring reactive node. 3. No new or progressive metastatic disease in the chest.  4.  4.5 TAA cards folloowing         Assessment   No problem-specific Assessment & Plan notes found for this encounter.     Sandrea Hughs, MD 03/06/2024

## 2024-03-04 ENCOUNTER — Telehealth: Payer: Self-pay | Admitting: Radiation Oncology

## 2024-03-04 ENCOUNTER — Ambulatory Visit: Payer: Medicare Other | Admitting: Internal Medicine

## 2024-03-04 ENCOUNTER — Ambulatory Visit: Admitting: Internal Medicine

## 2024-03-04 ENCOUNTER — Encounter: Payer: Self-pay | Admitting: Internal Medicine

## 2024-03-04 VITALS — BP 125/82 | HR 94 | Temp 97.6°F | Ht 74.0 in | Wt 215.2 lb

## 2024-03-04 DIAGNOSIS — R195 Other fecal abnormalities: Secondary | ICD-10-CM | POA: Diagnosis not present

## 2024-03-04 DIAGNOSIS — R197 Diarrhea, unspecified: Secondary | ICD-10-CM | POA: Diagnosis not present

## 2024-03-04 DIAGNOSIS — R911 Solitary pulmonary nodule: Secondary | ICD-10-CM

## 2024-03-04 DIAGNOSIS — C3412 Malignant neoplasm of upper lobe, left bronchus or lung: Secondary | ICD-10-CM

## 2024-03-04 NOTE — Patient Instructions (Signed)
 Nice to see you again today!  Further evaluation of your diarrhea is needed.  You do have blood in your stool.  Lets start with stool studies (GIP and a C. difficile)  Ultimately, you are going to need a repeat colonoscopy to specifically evaluate the blood found in your stool today.  We will schedule that after we get the stool results back.  ASA 3/Hemoccult positive stool  May take Imodium 2 mg at bedtime and first thing in the morning and may take an additional 2 times later in the day as needed to slow stooling down  Further recommendations to follow.

## 2024-03-04 NOTE — Progress Notes (Signed)
 Primary Care Physician:  Roe Rutherford, NP Primary Gastroenterologist:  Dr.   Pre-Procedure History & Physical: HPI:  Jonathan Greene is a 83 y.o. male here for diarrhea.  Patient states bowel function historically normal until about a month ago when he actually got constipated started a stool softener but no laxative.  Bowels got going again and now he has diarrhea it is incessant its watery it is nonbloody.  Taking Imodium to slow it down.  No new medications.  No recent antibiotics no travel,fever, associated abdominal pain no nausea or vomiting appetite is maintained.  Endorses 4 to 8 ounces of bourbon every day -  done this for years.  Saw [place] a few years back for elevated LFTs  - negative workup  Past Medical History:  Diagnosis Date   A-fib (HCC)    Arthritis    right hand   Bradycardia    COPD (chronic obstructive pulmonary disease) (HCC)    Dyspnea    Dysrhythmia    Frequent urination    GERD (gastroesophageal reflux disease)    Hypercholesterolemia    Hypertension    Pneumonia    Prostate cancer (HCC)    RADIATION TX AND SCHEDULED FOR SEED IMPLANTS 02-28-12   Sleep apnea    uses a cpap   Stroke (HCC)    2011;ST memory loss, no other deficits.   Urinary urgency     Past Surgical History:  Procedure Laterality Date   BRONCHIAL BIOPSY  10/24/2022   Procedure: BRONCHIAL BIOPSIES;  Surgeon: Josephine Igo, DO;  Location: [facility];  Service: Pulmonary;;   BRONCHIAL NEEDLE ASPIRATION BIOPSY  10/24/2022   Procedure: BRONCHIAL NEEDLE ASPIRATION BIOPSIES;  Surgeon: Josephine Igo, DO;  Location: [facility];  Service: Pulmonary;;   BRONCHIAL WASHINGS  10/24/2022   Procedure: BRONCHIAL WASHINGS;  Surgeon: Josephine Igo, DO;  Location: [facility];  Service: Pulmonary;;   CATARACT EXTRACTION W/PHACO Right 11/06/2013   Procedure: CATARACT EXTRACTION PHACO AND INTRAOCULAR LENS PLACEMENT (IOC);  Surgeon: Gemma Payor, MD;  Location: [facility];  Service:  Ophthalmology;  Laterality: Right;  CDE 11.67   CATARACT EXTRACTION W/PHACO Left 11/25/2015   Procedure: CATARACT EXTRACTION PHACO AND INTRAOCULAR LENS PLACEMENT ; CDE:  8.15;  Surgeon: Gemma Payor, MD;  Location: [facility];  Service: Ophthalmology;  Laterality: Left;   COLONOSCOPY  10/03/2006   [id number] rectal polyps cold biopsied/removed, otherwise normal rectum/Sigmoid diverticula.  Remainder of colon mucosa appeared normal   COLONOSCOPY N/A 08/19/2014   RMR: Radiation proctitis-status post APC ablation. Colonic diverticulosis.   CYSTOSCOPY  02/28/2012   Procedure: CYSTOSCOPY;  Surgeon: Valetta Fuller, MD;  Location: [facility];  Service: Urology;  Laterality: N/A;   no seeds noted in bladder   RADIOACTIVE SEED IMPLANT  02/28/2012   Procedure: RADIOACTIVE SEED IMPLANT;  Surgeon: Valetta Fuller, MD;  Location[facility];  Service: Urology;  Laterality: N/A;  67seeds implanted     Prior to Admission medications   Medication Sig Start Date End Date Taking? Authorizing Provider  acetaminophen (TYLENOL) 500 MG tablet Take 2 tablets (1,000 mg total) by mouth every 6 (six) hours as needed. 03/18/22  Yes Carlisle Beers, FNP  albuterol (VENTOLIN HFA) 108 (90 Base) MCG/ACT inhaler Inhale 1-2 puffs into the lungs every 6 (six) hours as needed. 01/10/24  Yes [provider]  amLODipine (NORVASC) 2.5 MG tablet Take 2.5 mg by mouth daily. 04/07/22  Yes [provider]  APPLE CIDER VINEGAR  PO Take 450 mg by mouth daily.   Yes [provider]  b complex vitamins capsule Take 1 capsule by mouth daily.   Yes [provider]  Cholecalciferol (VITAMIN D-1000 MAX ST) 25 MCG (1000 UT) tablet Take 1,000 Units by mouth daily.   Yes [provider]  escitalopram (LEXAPRO) 5 MG tablet Take 5 mg by mouth daily.   Yes [provider]  metoprolol succinate (TOPROL XL) 25 MG 24 hr tablet Take 1 tablet (25 mg total) by mouth daily.  07/29/20  Yes Strader, [place] M, PA-C  milk thistle 175 MG tablet Take 2,000 mg by mouth daily.   Yes [provider]  olopatadine (PATADAY) 0.1 % ophthalmic solution Place 1 drop into both eyes 2 (two) times daily. 03/02/23  Yes Particia Nearing, PA-C  omeprazole (PRILOSEC) 20 MG capsule Take 20 mg by mouth daily.   Yes [provider]  rivaroxaban (XARELTO) 20 MG TABS tablet Take 1 tablet (20 mg total) by mouth daily with supper. 03/16/15  Yes BranchDorothe Pea, MD  simvastatin (ZOCOR) 40 MG tablet Take 40 mg by mouth every other day. 12/15/14  Yes [provider]  solifenacin (VESICARE) 10 MG tablet Take 10 mg by mouth daily.  02/05/19  Yes [provider]  tadalafil (CIALIS) 10 MG tablet Take 1 tablet (10 mg total) by mouth daily as needed. 02/07/24  Yes Sharlene Dory, NP  tamsulosin (FLOMAX) 0.4 MG CAPS capsule Take 0.4 mg by mouth daily.   Yes [provider]    Allergies as of 03/04/2024   (No Known Allergies)    Family History  Problem Relation Age of Onset   Stroke Mother    Hypertension Mother    Colon cancer Sister        in her 48s   Breast cancer Sister    COPD Sister    Hypertension Sister    Pneumonia Sister    Heart attack Brother    COPD Son    COPD Maternal Aunt    Cancer Neg Hx    Liver disease Neg Hx     Social History   Socioeconomic History   Marital status: Widowed    Spouse name: Bonita Quin   Number of children: 2   Years of education: Not on file   Highest education level: Not on file  Occupational History   Occupation: retired  Tobacco Use   Smoking status: Former    Current packs/day: 0.00    Average packs/day: 2.0 packs/day for 35.0 years (70.0 ttl pk-yrs)    Types: Cigarettes    Start date: 02/21/1956    Quit date: 02/21/1991    Years since quitting: 33.0   Smokeless tobacco: Current    Types: Chew   Tobacco comments:    CHEW TOBACCO FOR 20 YRS   Vaping Use   Vaping status: Never Used   Substance and Sexual Activity   Alcohol use: Yes    Alcohol/week: 14.0 standard drinks of alcohol    Types: 14 Shots of liquor per week    Comment: 4 ounces daily   Drug use: No   Sexual activity: Yes    Birth control/protection: None  Other Topics Concern   Not on file  Social History Narrative   Not on file   Social Drivers of Health   Financial Resource Strain: Low Risk  (11/14/2023)   Received from [facility]   Overall Financial Resource Strain (CARDIA)    Difficulty of Paying  Living Expenses: Not hard at all  Food Insecurity: No Food Insecurity (11/14/2023)   Received from [facility]   Hunger Vital Sign    Worried About Running Out of Food in the Last Year: Never true    Ran Out of Food in the Last Year: Never true  Transportation Needs: No Transportation Needs (11/14/2023)   Received from [facility] - Transportation    Lack of Transportation (Medical): No    Lack of Transportation (Non-Medical): No  Physical Activity: Insufficiently Active (11/14/2023)   Received from [facility]   Exercise Vital Sign    Days of Exercise per Week: 4 days    Minutes of Exercise per Session: 30 min  Stress: No Stress Concern Present (11/14/2023)   Received from [facility] - Occupational Stress Questionnaire    Feeling of Stress : Not at all  Social Connections: Moderately Integrated (11/14/2023)   Received from [facility]   Social Network    How would you rate your social network (family, work, friends)?: Adequate participation with social networks  Intimate Partner Violence: Not At Risk (11/14/2023)   Received from [facility]   HITS    Over the last 12 months how often did your partner physically hurt you?: Never    Over the last 12 months how often did your partner insult you or talk down to you?: Never    Over the last 12 months how often did your partner threaten you with physical harm?: Never    Over  the last 12 months how often did your partner scream or curse at you?: Never    Review of Systems: See HPI, otherwise negative ROS  Physical Exam: BP 125/82 (BP Location: Right Arm, Patient Position: Sitting, Cuff Size: Normal)   Pulse 94   Temp 97.6 F (36.4 C) (Oral)   Ht 6\' 2"  (1.88 m)   Wt 215 lb 3.2 oz (97.6 kg)   SpO2 96%   BMI 27.63 kg/m  General:   Alert,   pleasant and cooperative in NAD.  Accompanied by his daughter Augusto Gamble Lungs:  Clear throughout to auscultation.   No wheezes, crackles, or rhonchi. No acute distress. Heart:  Regular rate and rhythm; no murmurs, clicks, rubs,  or gallops. Abdomen: Non-distended, normal bowel sounds.  Soft and nontender without appreciable mass or hepatosplenomegaly.  Rectal: No mass.  No impaction.  Brown stool is strongly Hemoccult positive  Impression/Plan: 83 year old gentleman with relatively new onset diarrhea which started after a transient bout of constipation.  Incessant watery diarrhea.  No obvious medications or recent antibiotics to implicate.  He is not impacted.  His stool is strongly Hemoccult positive. Further evaluation is warranted.  Recommendations:  stool studies (GIP and a C. difficile)  Plan for a diagnostic colonoscopy (Hemoccult positive stool/chronic diarrhea) ASA 3.  Would like to see the stool studies back for review prior to colonoscopy. The risks, benefits, limitations, alternatives and imponderables have been reviewed with the patient. Questions have been answered. All parties are agreeable.   May take Imodium 2 mg at bedtime and first thing in the morning and may take an additional 2 times later in the day as needed to slow stooling down.  Further recommendations to follow.    Notice: This dictation was prepared with Dragon dictation along with smaller phrase technology. Any transcriptional errors that result from this process are unintentional and may not be corrected upon review.

## 2024-03-04 NOTE — Telephone Encounter (Signed)
 I called the patient and his daughter and we discussed the results with the CT scan from 02/05/24. They were disappointed in the time it took to get good results. We will plan to follow up in 6 months with CT imaging or sooner if he develops more shortness of breath or productive mucous but we discussed bronchiectasis, aneurysmal changes, and emphysematous changes. He is going to proceed with a hip replacement later this month with Dr. Charlann Boxer. I wished him all the best and will be available as needed before his next visit.

## 2024-03-06 ENCOUNTER — Encounter: Payer: Self-pay | Admitting: Internal Medicine

## 2024-03-06 ENCOUNTER — Ambulatory Visit: Admitting: Internal Medicine

## 2024-03-06 VITALS — BP 115/73 | HR 88 | Ht 74.0 in | Wt 218.0 lb

## 2024-03-06 DIAGNOSIS — J4489 Other specified chronic obstructive pulmonary disease: Secondary | ICD-10-CM

## 2024-03-06 MED ORDER — ALBUTEROL SULFATE HFA 108 (90 BASE) MCG/ACT IN AERS: 1.0000 | INHALATION_SPRAY | RESPIRATORY_TRACT | 1 refills | Status: AC | PRN

## 2024-03-06 MED ORDER — BUDESONIDE-FORMOTEROL FUMARATE 160-4.5 MCG/ACT IN AERO: INHALATION_SPRAY | RESPIRATORY_TRACT | 12 refills | Status: DC

## 2024-03-06 NOTE — Patient Instructions (Addendum)
 Plan A = Automatic = Always=   Symbicort 80 Take 2 puffs first thing in am and then another 2 puffs about 12 hours later.    Work on inhaler technique:  relax and gently blow all the way out then take a nice smooth full deep breath back in, triggering the inhaler at same time you start breathing in.  Hold breath in for at least  5 seconds if you can. Blow out symbicort  thru nose. Rinse and gargle with water when done.  If mouth or throat bother you at all,  try brushing teeth/gums/tongue with arm and hammer toothpaste/ make a slurry and gargle and spit out.     Plan B = Backup (to supplement plan A, not to replace it) Only use your albuterol inhaler as a rescue medication to be used if you can't catch your breath by resting or doing a relaxed purse lip breathing pattern.  - The less you use it, the better it will work when you need it. - Ok to use the inhaler up to 2 puffs  every 4 hours if you must but call for appointment if use goes up over your usual need - Don't leave home without it !!  (think of it like the spare tire for your car)    Plan C = Crisis (instead of Plan B but only if Plan B stops working) - only use your albuterol nebulizer if you first try Plan B and it fails to help > ok to use the nebulizer up to every 4 hours but if start needing it regularly call for immediate appointment  For cough/ congestion > mucinex or mucinex dm  up to maximum of  1200 mg every 12 hours as needed   You will need to stop smoking for 2 full weeks to lower your risk of surgery   You are cleared for surgery

## 2024-03-07 DIAGNOSIS — J4489 Other specified chronic obstructive pulmonary disease: Secondary | ICD-10-CM | POA: Insufficient documentation

## 2024-03-07 NOTE — Assessment & Plan Note (Addendum)
 Active smoker with no airflow obst on PFTs 06/2020  - 03/06/2024  After extensive coaching inhaler device,  effectiveness =    75%  > added symbicort 80 2bid pre op   He is more bronchitis than asthma since resumed smoking w/in the last year but really doesn't have much copd at all so should do well with surgery with the following recs  Stop all smoking x 2 weeks preop  Mucinex or mucinex dm up to 1200 mg bid prn  ABC action plan - see avs for instructions unique to this ov   Periop f/u if needed, otherwise return in 3 m         Each maintenance medication was reviewed in detail including emphasizing most importantly the difference between maintenance and prns and under what circumstances the prns are to be triggered using an action plan format where appropriate.  Total time for H and P, chart review, counseling, reviewing hfa/neb  device(s) and generating customized AVS unique to this office visit / same day charting = 40 min for preop eval/ pt new to me

## 2024-03-11 ENCOUNTER — Ambulatory Visit: Payer: Medicare Other | Admitting: Urology

## 2024-03-11 LAB — GI PROFILE, STOOL, PCR

## 2024-03-11 NOTE — Telephone Encounter (Signed)
 Copy of Dr Thurston Hole note from 03/06/24 was faxed to [facility].

## 2024-03-12 LAB — CLOSTRIDIUM DIFFICILE BY PCR: Toxigenic C. Difficile by PCR: POSITIVE — AB

## 2024-03-13 ENCOUNTER — Encounter: Payer: Self-pay | Admitting: Internal Medicine

## 2024-03-13 NOTE — Patient Instructions (Addendum)
 DUE TO COVID-19 ONLY TWO VISITORS  (aged 82 and older)  ARE ALLOWED TO COME WITH YOU AND STAY IN THE WAITING ROOM ONLY DURING PRE OP AND PROCEDURE.   **NO VISITORS ARE ALLOWED IN THE SHORT STAY AREA OR RECOVERY ROOM!!**  IF YOU WILL BE ADMITTED INTO THE HOSPITAL YOU ARE ALLOWED ONLY FOUR SUPPORT PEOPLE DURING VISITATION HOURS ONLY (7 AM -8PM)   The support person(s) must pass our screening, gel in and out, and wear a mask at all times, including in the patient's room. Patients must also wear a mask when staff or their support person are in the room. Visitors GUEST BADGE MUST BE WORN VISIBLY  One adult visitor may remain with you overnight and MUST be in the room by 8 P.M.     Your procedure is scheduled on: 03/25/24   Report to [facility] Main Entrance    Report to admitting at: 1:30 PM   Call this number if you have problems the morning of surgery [phone]   Do not eat food :After Midnight.   After Midnight you may have the following liquids until: 1:00 PM DAY OF SURGERY  Water Black Coffee (sugar ok, NO MILK/CREAM OR CREAMERS)  Tea (sugar ok, NO MILK/CREAM OR CREAMERS) regular and decaf                             Plain Jell-O (NO RED)                                           Fruit ices (not with fruit pulp, NO RED)                                     Popsicles (NO RED)                                                                  Juice: apple, WHITE grape, WHITE cranberry Sports drinks like Gatorade (NO RED)    The day of surgery:  Drink ONE (1) Pre-Surgery Clear G2 at : 1:00 PM the morning of surgery. Drink in one sitting. Do not sip.  This drink was given to you during your hospital  pre-op appointment visit. Nothing else to drink after completing the  Pre-Surgery Clear Ensure or G2.          If you have questions, please contact your surgeon's office.  FOLLOW ANY ADDITIONAL PRE OP INSTRUCTIONS YOU RECEIVED FROM YOUR SURGEON'S OFFICE!!!   Oral Hygiene  is also important to reduce your risk of infection.                                    Remember - BRUSH YOUR TEETH THE MORNING OF SURGERY WITH YOUR REGULAR TOOTHPASTE  DENTURES WILL BE REMOVED PRIOR TO SURGERY PLEASE DO NOT APPLY "Poly grip" OR ADHESIVES!!!   Do NOT smoke after Midnight   Take these medicines the morning of surgery with  A SIP OF WATER:escitalopram,amlodipine,omeprazole,solifenacin.Use inhalers as usual.   DO NOT TAKE ANY ORAL DIABETIC MEDICATIONS DAY OF YOUR SURGERY  Bring CPAP mask and tubing day of surgery.                              You may not have any metal on your body including hair pins, jewelry, and body piercing             Do not wear lotions, powders, perfumes/cologne, or deodorant              Men may shave face and neck.   Do not bring valuables to the hospital. [facility] IS NOT             RESPONSIBLE   FOR VALUABLES.   Contacts, glasses, or bridgework may not be worn into surgery.   Bring small overnight bag day of surgery.   DO NOT BRING YOUR HOME MEDICATIONS TO THE HOSPITAL. PHARMACY WILL DISPENSE MEDICATIONS LISTED ON YOUR MEDICATION LIST TO YOU DURING YOUR ADMISSION IN THE HOSPITAL!    Patients discharged on the day of surgery will not be allowed to drive home.  Someone NEEDS to stay with you for the first 24 hours after anesthesia.   Special Instructions: Bring a copy of your healthcare power of attorney and living will documents         the day of surgery if you haven't scanned them before.              Please read over the following fact sheets you were given: IF YOU HAVE QUESTIONS ABOUT YOUR PRE-OP INSTRUCTIONS PLEASE CALL [phone]      Pre-operative 5 CHG Bath Instructions   You can play a key role in reducing the risk of infection after surgery. Your skin needs to be as free of germs as possible. You can reduce the number of germs on your skin by washing with CHG (chlorhexidine gluconate) soap before surgery. CHG is an  antiseptic soap that kills germs and continues to kill germs even after washing.   DO NOT use if you have an allergy to chlorhexidine/CHG or antibacterial soaps. If your skin becomes reddened or irritated, stop using the CHG and notify one of our RNs at [phone].   Please shower with the CHG soap starting 4 days before surgery using the following schedule:     Please keep in mind the following:  DO NOT shave, including legs and underarms, starting the day of your first shower.   You may shave your face at any point before/day of surgery.  Place clean sheets on your bed the day you start using CHG soap. Use a clean washcloth (not used since being washed) for each shower. DO NOT sleep with pets once you start using the CHG.   CHG Shower Instructions:  If you choose to wash your hair and private area, wash first with your normal shampoo/soap.  After you use shampoo/soap, rinse your hair and body thoroughly to remove shampoo/soap residue.  Turn the water OFF and apply about 3 tablespoons (45 ml) of CHG soap to a CLEAN washcloth.  Apply CHG soap ONLY FROM YOUR NECK DOWN TO YOUR TOES (washing for 3-5 minutes)  DO NOT use CHG soap on face, private areas, open wounds, or sores.  Pay special attention to the area where your surgery is being performed.  If you are having back surgery, having someone wash  your back for you may be helpful. Wait 2 minutes after CHG soap is applied, then you may rinse off the CHG soap.  Pat dry with a clean towel  Put on clean clothes/pajamas   If you choose to wear lotion, please use ONLY the CHG-compatible lotions on the back of this paper.     Additional instructions for the day of surgery: DO NOT APPLY any lotions, deodorants, cologne, or perfumes.   Put on clean/comfortable clothes.  Brush your teeth.  Ask your nurse before applying any prescription medications to the skin.      CHG Compatible Lotions   Aveeno Moisturizing lotion  Cetaphil  Moisturizing Cream  Cetaphil Moisturizing Lotion  Clairol Herbal Essence Moisturizing Lotion, Dry Skin  Clairol Herbal Essence Moisturizing Lotion, Extra Dry Skin  Clairol Herbal Essence Moisturizing Lotion, Normal Skin  Curel Age Defying Therapeutic Moisturizing Lotion with Alpha Hydroxy  Curel Extreme Care Body Lotion  Curel Soothing Hands Moisturizing Hand Lotion  Curel Therapeutic Moisturizing Cream, Fragrance-Free  Curel Therapeutic Moisturizing Lotion, Fragrance-Free  Curel Therapeutic Moisturizing Lotion, Original Formula  Eucerin Daily Replenishing Lotion  Eucerin Dry Skin Therapy Plus Alpha Hydroxy Crme  Eucerin Dry Skin Therapy Plus Alpha Hydroxy Lotion  Eucerin Original Crme  Eucerin Original Lotion  Eucerin Plus Crme Eucerin Plus Lotion  Eucerin TriLipid Replenishing Lotion  Keri Anti-Bacterial Hand Lotion  Keri Deep Conditioning Original Lotion Dry Skin Formula Softly Scented  Keri Deep Conditioning Original Lotion, Fragrance Free Sensitive Skin Formula  Keri Lotion Fast Absorbing Fragrance Free Sensitive Skin Formula  Keri Lotion Fast Absorbing Softly Scented Dry Skin Formula  Keri Original Lotion  Keri Skin Renewal Lotion Keri Silky Smooth Lotion  Keri Silky Smooth Sensitive Skin Lotion  Nivea Body Creamy Conditioning Oil  Nivea Body Extra Enriched Lotion  Nivea Body Original Lotion  Nivea Body Sheer Moisturizing Lotion Nivea Crme  Nivea Skin Firming Lotion  NutraDerm 30 Skin Lotion  NutraDerm Skin Lotion  NutraDerm Therapeutic Skin Cream  NutraDerm Therapeutic Skin Lotion  ProShield Protective Hand Cream  Provon moisturizing lotion  Incentive Spirometer  An incentive spirometer is a tool that can help keep your lungs clear and active. This tool measures how well you are filling your lungs with each breath. Taking long deep breaths may help reverse or decrease the chance of developing breathing (pulmonary) problems (especially infection) following: A long  period of time when you are unable to move or be active. BEFORE THE PROCEDURE  If the spirometer includes an indicator to show your best effort, your nurse or respiratory therapist will set it to a desired goal. If possible, sit up straight or lean slightly forward. Try not to slouch. Hold the incentive spirometer in an upright position. INSTRUCTIONS FOR USE  Sit on the edge of your bed if possible, or sit up as far as you can in bed or on a chair. Hold the incentive spirometer in an upright position. Breathe out normally. Place the mouthpiece in your mouth and seal your lips tightly around it. Breathe in slowly and as deeply as possible, raising the piston or the ball toward the top of the column. Hold your breath for 3-5 seconds or for as long as possible. Allow the piston or ball to fall to the bottom of the column. Remove the mouthpiece from your mouth and breathe out normally. Rest for a few seconds and repeat Steps 1 through 7 at least 10 times every 1-2 hours when you are awake. Take your time and  take a few normal breaths between deep breaths. The spirometer may include an indicator to show your best effort. Use the indicator as a goal to work toward during each repetition. After each set of 10 deep breaths, practice coughing to be sure your lungs are clear. If you have an incision (the cut made at the time of surgery), support your incision when coughing by placing a pillow or rolled up towels firmly against it. Once you are able to get out of bed, walk around indoors and cough well. You may stop using the incentive spirometer when instructed by your caregiver.  RISKS AND COMPLICATIONS Take your time so you do not get dizzy or light-headed. If you are in pain, you may need to take or ask for pain medication before doing incentive spirometry. It is harder to take a deep breath if you are having pain. AFTER USE Rest and breathe slowly and easily. It can be helpful to keep track of a log  of your progress. Your caregiver can provide you with a simple table to help with this. If you are using the spirometer at home, follow these instructions: SEEK MEDICAL CARE IF:  You are having difficultly using the spirometer. You have trouble using the spirometer as often as instructed. Your pain medication is not giving enough relief while using the spirometer. You develop fever of 100.5 F (38.1 C) or higher. SEEK IMMEDIATE MEDICAL CARE IF:  You cough up bloody sputum that had not been present before. You develop fever of 102 F (38.9 C) or greater. You develop worsening pain at or near the incision site. MAKE SURE YOU:  Understand these instructions. Will watch your condition. Will get help right away if you are not doing well or get worse. Document Released: 04/02/2007 Document Revised: 02/12/2012 Document Reviewed: 06/03/2007 [facility] Patient Information 2014 [place], [place].   ________________________________________________________________________

## 2024-03-13 NOTE — Telephone Encounter (Signed)
Routing to you in Dr. Luvenia Starch absence.

## 2024-03-14 ENCOUNTER — Encounter (HOSPITAL_COMMUNITY): Payer: Self-pay

## 2024-03-14 ENCOUNTER — Encounter (HOSPITAL_COMMUNITY)
Admission: RE | Admit: 2024-03-14 | Discharge: 2024-03-14 | Disposition: A | Discharge status: Home / Self Care | Source: Ambulatory Visit | Attending: Orthopedic Surgery | Admitting: Orthopedic Surgery

## 2024-03-14 ENCOUNTER — Telehealth: Payer: Self-pay | Admitting: *Deleted

## 2024-03-14 ENCOUNTER — Encounter: Payer: Self-pay | Admitting: *Deleted

## 2024-03-14 ENCOUNTER — Other Ambulatory Visit: Payer: Self-pay

## 2024-03-14 ENCOUNTER — Other Ambulatory Visit: Payer: Self-pay | Admitting: *Deleted

## 2024-03-14 VITALS — BP 130/93 | HR 74 | Temp 97.6°F | Ht 74.0 in

## 2024-03-14 DIAGNOSIS — Z01818 Encounter for other preprocedural examination: Secondary | ICD-10-CM | POA: Insufficient documentation

## 2024-03-14 DIAGNOSIS — Z8546 Personal history of malignant neoplasm of prostate: Secondary | ICD-10-CM | POA: Insufficient documentation

## 2024-03-14 DIAGNOSIS — I10 Essential (primary) hypertension: Secondary | ICD-10-CM | POA: Diagnosis not present

## 2024-03-14 DIAGNOSIS — M1612 Unilateral primary osteoarthritis, left hip: Secondary | ICD-10-CM | POA: Insufficient documentation

## 2024-03-14 HISTORY — DX: Prediabetes: R73.03

## 2024-03-14 LAB — SURGICAL PCR SCREEN
MRSA, PCR: NEGATIVE
Staphylococcus aureus: NEGATIVE

## 2024-03-14 LAB — TYPE AND SCREEN
ABO/RH(D): O POS
Antibody Screen: NEGATIVE

## 2024-03-14 MED ORDER — PEG 3350-KCL-NA BICARB-NACL 420 G PO SOLR: 4000.0000 mL | Freq: Once | ORAL | 0 refills | Status: AC

## 2024-03-14 NOTE — Progress Notes (Addendum)
 For Anesthesia: PCP - Roe Rutherford, NP LOV: 03/10/24 Cardiologist - Antoine Poche, MD LOV: 06/20/23 Clearance: Sharlene Dory: NP: 02/07/24 Bowel Prep reminder:  Chest x-ray - 06/09/23  CT Chest: 02/28/24 EKG -06/20/23 Stress Test -  ECHO - 06/30/20 Cardiac Cath -  Pacemaker/ICD device last checked: Pacemaker orders received: Device Rep notified:  Spinal Cord Stimulator:N/A  Sleep Study -Yes  CPAP - N/A  Fasting Blood Sugar -  Checks Blood Sugar _____ times a day Date and result of last Hgb A1c-5.9: 03/10/24  Last dose of GLP1 agonist- N/A GLP1 instructions:   Last dose of SGLT-2 inhibitors- NN/A SGLT-2 instructions:   Blood Thinner Instructions:Xarelto: Hold after: 03/21/24 Aspirin Instructions: Last Dose:  Activity level: Can go up a flight of stairs and activities of daily living without stopping and without chest pain and/or shortness of breath      Unable to go up a flight of stairs without shortness of breath    Anesthesia review:Hx: COPD,Smoker,CAD,OSA((NO CPAP),HTN,Afib,Stroke,Emphysema,aortic aneurism.   Patient denies shortness of breath, fever, cough and chest pain at PAT appointment   Patient verbalized understanding of instructions that were given to them at the PAT appointment. Patient was also instructed that they will need to review over the PAT instructions again at home before surgery.

## 2024-03-14 NOTE — Telephone Encounter (Signed)
 Called to get pt scheduled for his colonoscopy. He states he will have daughter to call back  TCS asa 3 w/Dr.Rourk

## 2024-03-15 MED ORDER — VANCOMYCIN HCL 125 MG PO CAPS: 125.0000 mg | ORAL_CAPSULE | Freq: Four times a day (QID) | ORAL | 0 refills | Status: AC

## 2024-03-15 NOTE — Telephone Encounter (Signed)
 Cdiff is positive. Will send in Vancomycin to pharmacy to take QID for 10 days. If no improvement over next few days, we will switch to Dificid. Although would prefer Dificid, will need PA and need to go ahead and start therapy on vanc with plans to switch if needed. Hold off on imodium while on vanc.

## 2024-03-20 NOTE — Progress Notes (Signed)
 Anesthesia Chart Review   Case: [id number] Date/Time: 03/25/24 1545   Procedure: ARTHROPLASTY, HIP, TOTAL, ANTERIOR APPROACH (Left: Hip)   Anesthesia type: Spinal   Pre-op diagnosis: Left hip osteoarthritis   Location: [facility] ROOM 09 / [facility]   Surgeons: Durene Romans, MD       DISCUSSION:83 y.o. smoker with h/o HTN, COPD, a-fib, sleep apnea, prostate cancer, left hip OA scheduled for above procedure 03/25/2024 with Dr. Durene Romans.   Pt recently tested positive for cdiff on 03/13/24.  Started on vanc 03/15/24 to complete on 4/22.  Recommend postponing case, discussed with Rosalene Billings.   Pt seen by pulmonology 03/07/2024. Per OV note, "He is more bronchitis than asthma since resumed smoking w/in the last year but really doesn't have much copd at all so should do well with surgery with the following recs   Stop all smoking x 2 weeks preop  Mucinex or mucinex dm up to 1200 mg bid prn  ABC action plan - see avs for instructions unique to this ov"  Per cardiology preoperative evaluation 02/07/2024, "Mr. Gronewold's perioperative risk of a major cardiac event is 6.6% according to the Revised Cardiac Risk Index (RCRI).  Therefore, he is at high risk for perioperative complications.   His functional capacity is poor at 3.63 METs according to the Duke Activity Status Index (DASI). Recommendations: The patient is at high risk for perioperative cardiac complications and is at a low functional capacity.  However, further testing will not change how his cardiac status is managed.  Proceed with surgery at high risk if there are no other options for treatment. Antiplatelet and/or Anticoagulation Recommendations: Xarelto (Rivaroxaban) can be held for  3 days prior to surgery.  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 (all labs ordered are listed, but only abnormal results are displayed)  Labs Reviewed  SURGICAL PCR SCREEN  TYPE AND SCREEN     IMAGES:   EKG:   CV: Echo 06/30/2020 1. Left ventricular ejection fraction, by estimation, is 55 to 60%. The  left ventricle has normal function. The left ventricle has no regional  wall motion abnormalities. There is moderate left ventricular hypertrophy.  Left ventricular diastolic  parameters are indeterminate in the setting of atrial fibrillation.   2. Right ventricular systolic function is normal. The right ventricular  size is normal.   3. Left atrial size was mildly dilated.   4. The mitral valve is grossly normal. Trivial mitral valve  regurgitation.   5. The aortic valve is tricuspid. Aortic valve regurgitation is not  visualized. Mild aortic valve sclerosis is present, with no evidence of  aortic valve stenosis.   6. The inferior vena cava is normal in size with greater than 50%  respiratory variability, suggesting right atrial pressure of 3 mmHg.   Past Medical History:  Diagnosis Date   A-fib [facility])    Arthritis    right hand   Bradycardia    COPD (chronic obstructive pulmonary disease) (HCC)    Dyspnea    Dysrhythmia    Frequent urination    GERD (gastroesophageal reflux disease)  Hypercholesterolemia    Hypertension    Pneumonia    Pre-diabetes    Prostate cancer (HCC)    RADIATION TX AND SCHEDULED FOR SEED IMPLANTS 02-28-12   Sleep apnea    uses a cpap   Stroke (HCC)    2011;ST memory loss, no other deficits.   Urinary urgency     Past Surgical History:  Procedure Laterality Date   BRONCHIAL BIOPSY  10/24/2022   Procedure: BRONCHIAL BIOPSIES;  Surgeon: Prudy Brownie, DO;  Location: [facility];  Service: Pulmonary;;   BRONCHIAL NEEDLE ASPIRATION BIOPSY  10/24/2022   Procedure: BRONCHIAL NEEDLE ASPIRATION  BIOPSIES;  Surgeon: Prudy Brownie, DO;  Location: [facility];  Service: Pulmonary;;   BRONCHIAL WASHINGS  10/24/2022   Procedure: BRONCHIAL WASHINGS;  Surgeon: Prudy Brownie, DO;  Location: [facility];  Service: Pulmonary;;   CATARACT EXTRACTION W/PHACO Right 11/06/2013   Procedure: CATARACT EXTRACTION PHACO AND INTRAOCULAR LENS PLACEMENT (IOC);  Surgeon: Anner Kill, MD;  Location: [facility];  Service: Ophthalmology;  Laterality: Right;  CDE 11.67   CATARACT EXTRACTION W/PHACO Left 11/25/2015   Procedure: CATARACT EXTRACTION PHACO AND INTRAOCULAR LENS PLACEMENT ; CDE:  8.15;  Surgeon: Anner Kill, MD;  Location: [facility];  Service: Ophthalmology;  Laterality: Left;   COLONOSCOPY  10/03/2006   [id number] rectal polyps cold biopsied/removed, otherwise normal rectum/Sigmoid diverticula.  Remainder of colon mucosa appeared normal   COLONOSCOPY N/A 08/19/2014   RMR: Radiation proctitis-status post APC ablation. Colonic diverticulosis.   CYSTOSCOPY  02/28/2012   Procedure: CYSTOSCOPY;  Surgeon: Livingston Rigg, MD;  [facility];  Service: Urology;  Laterality: N/A;   no seeds noted in bladder   MELANOMA EXCISION Right    neck   RADIOACTIVE SEED IMPLANT  02/28/2012   Procedure: RADIOACTIVE SEED IMPLANT;  Surgeon: Livingston Rigg, MD;  [facility];  Service: Urology;  Laterality: N/A;  67seeds implanted     MEDICATIONS:  albuterol (VENTOLIN HFA) 108 (90 Base) MCG/ACT inhaler   amLODipine (NORVASC) 2.5 MG tablet   APPLE CIDER VINEGAR PO   b complex vitamins capsule   budesonide-formoterol (SYMBICORT) 160-4.5 MCG/ACT inhaler   escitalopram (LEXAPRO) 5 MG tablet   metoprolol succinate (TOPROL XL) 25 MG 24 hr tablet   MILK THISTLE PO   omeprazole (PRILOSEC) 20 MG capsule   rivaroxaban (XARELTO) 20 MG TABS tablet   simvastatin (ZOCOR) 40 MG tablet   solifenacin (VESICARE) 10 MG tablet   tadalafil (CIALIS) 10 MG tablet   tamsulosin (FLOMAX)  0.4 MG CAPS capsule   vancomycin (VANCOCIN) 125 MG capsule   No current facility-administered medications for this encounter.   Chick Cotton Ward, PA-C [facility] [phone]

## 2024-03-25 DIAGNOSIS — M1612 Unilateral primary osteoarthritis, left hip: Secondary | ICD-10-CM

## 2024-03-25 DIAGNOSIS — R7303 Prediabetes: Secondary | ICD-10-CM

## 2024-04-08 ENCOUNTER — Telehealth: Payer: Self-pay | Admitting: *Deleted

## 2024-04-08 NOTE — Telephone Encounter (Signed)
 Patient called in to cancel procedure 5/14. He is unable to have done at this time and reports once he has recovered from his hip surgery that is upcoming he will call back

## 2024-04-14 ENCOUNTER — Ambulatory Visit: Payer: Self-pay | Admitting: Internal Medicine

## 2024-04-14 ENCOUNTER — Encounter (HOSPITAL_COMMUNITY)

## 2024-04-14 NOTE — Telephone Encounter (Signed)
 Chief Complaint: SOB Symptoms: wheezing, SOB with exertion, productive cough Frequency: chronic, but worsening the past few days Pertinent Negatives: Patient denies fever, URI sx, severe SOB, CP Disposition: [] ED /[] Urgent Care (no appt availability in office) / [x] Appointment(In office/virtual)/ []  [facility]/ [] Home Care/ [] Refused Recommended Disposition /[] [place]/ []  Follow-up with PCP Additional Notes: Pt daughter, Jonathan Greene, calling c/o SOB with exertion (above baseline), productive cough, and wheezing that has worsened in the past few days. Jonathan Greene reports pt was scheduled for hip surgery, but was rescheduled d/t being C.Diff positive. Pt was prescribed abx to treat C.Diff and respiratory sx improved, however, sx are coming back and Jonathan Greene has concerns that he may have a lung flare up. Of note, could hear pt in background speaking in full sentences, with no audible wheezing/SOB. Pt states he is using maintenance INH as prescribed. Triager reinforced albuterol  usage, since pt states he has not used recently. Scheduled patient per protocol on 04/15/24 with alternate provider. Caregiver verbalized understanding and to call back with worsening symptoms.      Copied from CRM [id number]. Topic: Clinical - Red Word Triage >> Apr 14, 2024  2:24 PM Justina Oman C wrote: Red Word that prompted transfer to Nurse Triage: Patient's child Jonathan Greene [phone], patient is having a hard time spitting up mucous and coughing, wheezing. Patient has COPD, deals with shortness of breath. Patient denies dizziness, nor pain. Jonathan Greene is trying to make an appointment for patient to be seen. Please advise. Reason for Disposition  [1] MODERATE longstanding difficulty breathing (e.g., speaks in phrases, SOB even at rest, pulse 100-120) AND [2] SAME as normal  Answer Assessment - Initial Assessment Questions E2C2 Pulmonary Triage - Initial Assessment Questions "Chief Complaint (e.g., cough, sob, wheezing,  fever, chills, sweat or additional symptoms) *Go to specific symptom protocol after initial questions. SOB, coughing, wheezing New sx - productive thick green/gray/black cough Diarrhea - reports Cdiff + Recent total hip replacement scheduled that needed to be rescheduled - reports was prescribed abx to tx C diff -- cough incidentally improved, but then started coming back once abx completed   "How long have symptoms been present?" X3 mos  Have you tested for COVID or Flu? Note: If not, ask patient if a home test can be taken. If so, instruct patient to call back for positive results. No  MEDICINES:   "Have you used any OTC meds to help with symptoms?" Yes If yes, ask "What medications?" Mucinex - some relief  "Have you used your inhalers/maintenance medication?" Yes If yes, "What medications?" Albuterol  PRN - has not needed it Breztri - 2 puffs twice daily  If inhaler, ask "How many puffs and how often?" Note: Review instructions on medication in the chart. See above  OXYGEN: "Do you wear supplemental oxygen?" No If yes, "How many liters are you supposed to use?" N/a  "Do you monitor your oxygen levels?" No If yes, "What is your reading (oxygen level) today?" Reports has a monitor, but does not monitor on daily basis Per pt, "has been running 96-97"  "What is your usual oxygen saturation reading?"  (Note: Pulmonary O2 sats should be 90% or greater) Mid 90s.    4. SEVERITY: "How bad is your breathing?" (e.g., mild, moderate, severe)    - MILD: No SOB at rest, mild SOB with walking, speaks normally in sentences, can lie down, no retractions, pulse < 100.    - MODERATE: SOB at rest, SOB with minimal exertion and prefers to sit, cannot  lie down flat, speaks in phrases, mild retractions, audible wheezing, pulse 100-120.    - SEVERE: Very SOB at rest, speaks in single words, struggling to breathe, sitting hunched forward, retractions, pulse > 120      Mild - SOB with exertion  only 5. RECURRENT SYMPTOM: "Have you had difficulty breathing before?" If Yes, ask: "When was the last time?" and "What happened that time?"      unknown 6. CARDIAC HISTORY: "Do you have any history of heart disease?" (e.g., heart attack, angina, bypass surgery, angioplasty)      denies 7. LUNG HISTORY: "Do you have any history of lung disease?"  (e.g., pulmonary embolus, asthma, emphysema)     COPD, nodules 8. CAUSE: "What do you think is causing the breathing problem?"      lungs 9. OTHER SYMPTOMS: "Do you have any other symptoms? (e.g., dizziness, runny nose, cough, chest pain, fever)     Denies all sx above except runny nose (reports allergies and is on daily antihistamines)  Protocols used: Breathing Difficulty-A-AH

## 2024-04-14 NOTE — Telephone Encounter (Signed)
 FYI to [employer]. Pt is scheduled to see you tomorrow. ( 04-15-24)

## 2024-04-15 ENCOUNTER — Ambulatory Visit (INDEPENDENT_AMBULATORY_CARE_PROVIDER_SITE_OTHER): Admitting: Nurse Practitioner

## 2024-04-15 ENCOUNTER — Other Ambulatory Visit: Payer: Self-pay | Admitting: Nurse Practitioner

## 2024-04-15 ENCOUNTER — Encounter (HOSPITAL_BASED_OUTPATIENT_CLINIC_OR_DEPARTMENT_OTHER): Payer: Self-pay | Admitting: Nurse Practitioner

## 2024-04-15 ENCOUNTER — Ambulatory Visit (HOSPITAL_BASED_OUTPATIENT_CLINIC_OR_DEPARTMENT_OTHER)

## 2024-04-15 VITALS — BP 142/97 | HR 61 | Ht 74.0 in | Wt 215.0 lb

## 2024-04-15 DIAGNOSIS — J4489 Other specified chronic obstructive pulmonary disease: Secondary | ICD-10-CM

## 2024-04-15 DIAGNOSIS — J4541 Moderate persistent asthma with (acute) exacerbation: Secondary | ICD-10-CM | POA: Diagnosis not present

## 2024-04-15 DIAGNOSIS — C3412 Malignant neoplasm of upper lobe, left bronchus or lung: Secondary | ICD-10-CM

## 2024-04-15 DIAGNOSIS — J432 Centrilobular emphysema: Secondary | ICD-10-CM

## 2024-04-15 DIAGNOSIS — R059 Cough, unspecified: Secondary | ICD-10-CM

## 2024-04-15 DIAGNOSIS — J479 Bronchiectasis, uncomplicated: Secondary | ICD-10-CM | POA: Insufficient documentation

## 2024-04-15 DIAGNOSIS — F17210 Nicotine dependence, cigarettes, uncomplicated: Secondary | ICD-10-CM

## 2024-04-15 MED ORDER — PREDNISONE 20 MG PO TABS: 40.0000 mg | ORAL_TABLET | Freq: Every day | ORAL | 0 refills | Status: AC

## 2024-04-15 MED ORDER — BREZTRI AEROSPHERE 160-9-4.8 MCG/ACT IN AERO: 2.0000 | INHALATION_SPRAY | Freq: Two times a day (BID) | RESPIRATORY_TRACT | Status: DC

## 2024-04-15 MED ORDER — BREZTRI AEROSPHERE 160-9-4.8 MCG/ACT IN AERO: 2.0000 | INHALATION_SPRAY | Freq: Two times a day (BID) | RESPIRATORY_TRACT | 11 refills | Status: DC

## 2024-04-15 MED ORDER — ALBUTEROL SULFATE (2.5 MG/3ML) 0.083% IN NEBU: 2.5000 mg | INHALATION_SOLUTION | Freq: Four times a day (QID) | RESPIRATORY_TRACT | 3 refills | Status: AC | PRN

## 2024-04-15 NOTE — Assessment & Plan Note (Addendum)
 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 Difficulties with increased sputum production over the last few months. 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 No other infectious symptoms. Would prefer to hold off on further antimicrobial testing given recent C diff diagnosis and treatment for this with vancomycin . Will obtain repeat CXR today to rule out superimposed infection. Target mucociliary clearance therapies and challenge with steroid course. Initiate scheduled bronchodilator regimen - provided with samples of Breztri and new rx send. Teachback performed. Side effect profile reviewed. Check IgE and CBC with diff to assess for allergic component/peripheral eosinophils. Will get him set up for repeat PFTs upon return. Action plan in place.  Patient Instructions  Start Breztri 2 puffs Twice daily. Brush tongue and rinse mouth afterwards Continue Albuterol  inhaler 2 puffs or 3 mL neb every 6 hours as needed for shortness of breath or wheezing. Notify if symptoms persist despite rescue inhaler/neb use. I also sent in a nebulizer machine that you can use 1-2 times a day, or more often if having breathing difficulties, but this should help you clear out some of the phlegm  Use guaifenesin (Mucinex) [phone] mg Twice daily for cough/congestion Prednisone  40 mg daily for 5 days. Take in AM with food  Chest x ray and labs today   Repeat PFT - plan to stop your Breztri a few days before, if your breathing is doing okay  Follow up with oncology as scheduled   Follow up in 6 weeks after PFT with new pulmonologist or Katie Tahlor Berenguer,NP. If symptoms do not improve or worsen, please contact office for sooner follow up or seek emergency care.

## 2024-04-15 NOTE — Addendum Note (Signed)
 Addended by: Kary Pages on: 04/15/2024 11:21 AM   Modules accepted: Orders

## 2024-04-15 NOTE — Patient Instructions (Addendum)
 Start Breztri 2 puffs Twice daily. Brush tongue and rinse mouth afterwards Continue Albuterol  inhaler 2 puffs or 3 mL neb every 6 hours as needed for shortness of breath or wheezing. Notify if symptoms persist despite rescue inhaler/neb use. I also sent in a nebulizer machine that you can use 1-2 times a day, or more often if having breathing difficulties, but this should help you clear out some of the phlegm  Use guaifenesin (Mucinex) [phone] mg Twice daily for cough/congestion Prednisone  40 mg daily for 5 days. Take in AM with food  Chest x ray and labs today   Repeat PFT - plan to stop your Breztri a few days before, if your breathing is doing okay  Follow up with oncology as scheduled   Follow up in 6 weeks after PFT with new pulmonologist or Katie Harlin Mazzoni,NP. If symptoms do not improve or worsen, please contact office for sooner follow up or seek emergency care.

## 2024-04-15 NOTE — Assessment & Plan Note (Signed)
 Sputum production which is not acute in chronicity. Will check AFB and sputum culture. See above plan. Monitor while on ICS with underlying btx and increased risk for lung infections. Aggressive mucociliary clearance.

## 2024-04-15 NOTE — Assessment & Plan Note (Signed)
 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 Follow up with oncology as scheduled

## 2024-04-15 NOTE — Progress Notes (Signed)
 @Patient  ID: Jonathan Greene, male    DOB: 1941-01-07, 83 y.o.   MRN: [id number]  Chief Complaint  Patient presents with   Acute Visit    Coughing up green/grey mucous    Referring provider: [name], NP  HPI: 83 year old male, active smoker followed for asthmatic bronchitis and lung nodule. He is a patient of Dr. Jacqui Mau and last seen in office 03/06/2024. 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 Past medical history significant for PAF, artery dissection, HTN, hx of stroke, thoracic aortic aneurysm, OSA, GERD, prostate cancer, HLD, depression, prediabetes.   TEST/EVENTS:  06/30/2020 PFT: FEV1 99%, ratio 77, TLC 90%, DLCO 51% 02/05/2024 CT chest: atherosclerosis. Stable thoracic aortic aneurysm. Mildly enlarged 1.3 cm right pretracheal node, slightly decreased. Emphysema. Treated LUL nodule with postradiation changes. Mild to moderate patchy subpleural reticulation with mild tx BTX.   03/06/2024 OV with Dr. Waymond Hailey. Former Dr. Matilde Son pt. Seen by NP in July 2024. Referred back for preoperative eval prior to L THR. Limited mobility due to hip, not DOE. Mucus still green and large volume, mostly worse in AM and since he started back smoking. No formal obstruction on prior PFT.   04/15/2024: Today - follow up Discussed the use of AI scribe software for clinical note transcription with the patient, who gave verbal consent to proceed.  History of Present Illness   Jonathan Greene is an 83 year old male who presents with a worsening productive cough over the last three months. He is accompanied by his daughter.  He has experienced a worsening productive cough over the past three months, with sputum described as green and sometimes gray. The cough is most prominent in the mornings and when lying down at night. Sometimes occurs throughout the day. No hemoptysis, fevers, chills, or night sweats have been noted, and his weight has  remained stable. He does have shortness of breath with exertion but feels like this is relatively stable. He's not been using his albuterol  rescue inhaler. He is not currently on a maintenance inhaler.   He resumed smoking approximately a year ago. He was recently treated with vancomycin  for a gastrointestinal infection (C. diff) in April.  He reports some wheezing but no sinus congestion or drainage. Not taking any OTC medications.   A CT scan performed in March did not reveal any infection, and he recalls having a similar cough at that time.       No Known Allergies  Immunization History  Administered Date(s) Administered   Fluad Quad(high Dose 65+) 09/15/2020, 09/16/2021, 09/01/2022   Influenza, High Dose Seasonal PF 09/06/2018, 08/18/2019, 09/15/2020   Moderna Sars-Covid-2 Vaccination 11/19/2020   PNEUMOCOCCAL CONJUGATE-20 01/18/2022   Pneumococcal Conjugate-13 08/10/2015   Tdap 03/24/2022   Zoster Recombinant(Shingrix) 10/18/2015   Zoster, Live 10/18/2015    Past Medical History:  Diagnosis Date   A-fib (HCC)    Arthritis    right hand   Bradycardia    COPD (chronic obstructive pulmonary disease) (HCC)    Dyspnea    Dysrhythmia    Frequent urination    GERD (gastroesophageal reflux disease)    Hypercholesterolemia    Hypertension    Pneumonia    Pre-diabetes    Prostate cancer (HCC)    RADIATION TX AND SCHEDULED FOR SEED IMPLANTS 02-28-12   Sleep apnea    uses a cpap   Stroke (HCC)    2011;ST memory loss, no other  deficits.   Urinary urgency     Tobacco History: Social History   Tobacco Use  Smoking Status Every Day   Current packs/day: 0.00   Average packs/day: 2.0 packs/day for 35.0 years (70.0 ttl pk-yrs)   Types: Cigarettes   Start date: 02/21/1956   Last attempt to quit: 02/21/1991   Years since quitting: 33.1  Smokeless Tobacco Current   Types: Chew  Tobacco Comments   CHEW TOBACCO FOR 20 YRS    Ready to quit: Not Answered Counseling given:  Not Answered Tobacco comments: CHEW TOBACCO FOR 20 YRS    Outpatient Medications Prior to Visit  Medication Sig Dispense Refill   albuterol  (VENTOLIN  HFA) 108 (90 Base) MCG/ACT inhaler Inhale 1-2 puffs into the lungs every 4 (four) hours as needed. 18 g 1   amLODipine  (NORVASC ) 2.5 MG tablet Take 2.5 mg by mouth daily.     APPLE CIDER VINEGAR PO Take 450 mg by mouth in the morning and at bedtime.     b complex vitamins capsule Take 1 capsule by mouth in the morning.     escitalopram (LEXAPRO) 5 MG tablet Take 5 mg by mouth in the morning.     metoprolol  succinate (TOPROL  XL) 25 MG 24 hr tablet Take 1 tablet (25 mg total) by mouth daily. (Patient taking differently: Take 25 mg by mouth at bedtime.) 30 tablet 11   MILK THISTLE PO Take 1 capsule by mouth in the morning and at bedtime.     omeprazole (PRILOSEC) 20 MG capsule Take 20 mg by mouth daily.     rivaroxaban  (XARELTO ) 20 MG TABS tablet Take 1 tablet (20 mg total) by mouth daily with supper. 90 tablet 4   simvastatin (ZOCOR) 40 MG tablet Take 40 mg by mouth every other day. At bedtime.     solifenacin (VESICARE) 10 MG tablet Take 10 mg by mouth in the morning.     tadalafil  (CIALIS ) 10 MG tablet Take 1 tablet (10 mg total) by mouth daily as needed. 10 tablet 2   tamsulosin (FLOMAX) 0.4 MG CAPS capsule Take 0.4 mg by mouth at bedtime.     budesonide -formoterol  (SYMBICORT ) 160-4.5 MCG/ACT inhaler Take 2 puffs first thing in am and then another 2 puffs about 12 hours later. 1 each 12   No facility-administered medications prior to visit.     Review of Systems:   Constitutional: No weight loss or gain, night sweats, fevers, chills, fatigue, or lassitude. HEENT: No headaches, difficulty swallowing, tooth/dental problems, or sore throat. No sneezing, itching, ear ache, nasal congestion, or post nasal drip CV:  No chest pain, orthopnea, PND, swelling in lower extremities, anasarca, dizziness, palpitations, syncope Resp: + shortness of  breath with exertion; productive cough; occasional wheeze. No hemoptysis. No chest wall deformity GI:  +diarrhea (improved). No heartburn, indigestion, abdominal pain, nausea, vomiting, change in bowel habits, loss of appetite, bloody stools.  GU: No dysuria, change in color of urine, urgency or frequency.   Skin: No rash, lesions, ulcerations MSK:  No joint pain or swelling.   Neuro: No dizziness or lightheadedness.  Psych: No depression or anxiety. Mood stable.     Physical Exam:  BP (!) 142/97   Pulse 61   Ht 6\' 2"  (1.88 m)   Wt 215 lb (97.5 kg)   SpO2 98%   BMI 27.60 kg/m   GEN: Pleasant, interactive, well-kempt; in no acute distress HEENT:  Normocephalic and atraumatic. PERRLA. Sclera white. Nasal turbinates pink, moist and patent bilaterally. No  rhinorrhea present. Oropharynx pink and moist, without exudate or edema. No lesions, ulcerations, or postnasal drip.  NECK:  Supple w/ fair ROM. No lymphadenopathy.   CV: Irregular rhythm, rate controlled, no m/r/g, no peripheral edema. Pulses intact, +2 bilaterally. No cyanosis, pallor or clubbing. PULMONARY:  Unlabored, regular breathing. Diminished bibasilar airflow bilaterally A&P w/o wheezes/rales/rhonchi. No accessory muscle use.  GI: BS present and normoactive. Soft, non-tender to palpation. No organomegaly or masses detected.  MSK: No erythema, warmth or tenderness. Cap refil <2 sec all extrem. No deformities or joint swelling noted.  Neuro: A/Ox3. No focal deficits noted.   Skin: Warm, no lesions or rashe Psych: Normal affect and behavior. Judgement and thought content appropriate.     Lab Results:  CBC    Component Value Date/Time   WBC 8.2 10/24/2022 1103   RBC 4.96 10/24/2022 1103   HGB 16.3 10/24/2022 1103   HCT 47.5 10/24/2022 1103   PLT 159 10/24/2022 1103   MCV 95.8 10/24/2022 1103   MCH 32.9 10/24/2022 1103   MCHC 34.3 10/24/2022 1103   RDW 13.2 10/24/2022 1103   LYMPHSABS 1.5 03/24/2022 1447   MONOABS  0.8 03/24/2022 1447   EOSABS 0.2 03/24/2022 1447   BASOSABS 0.1 03/24/2022 1447    BMET    Component Value Date/Time   NA 138 10/24/2022 1103   K 3.9 10/24/2022 1103   CL 103 10/24/2022 1103   CO2 22 10/24/2022 1103   GLUCOSE 113 (H) 10/24/2022 1103   BUN 12 10/24/2022 1103   CREATININE 1.20 02/05/2024 1545   CREATININE 1.34 (H) 11/30/2021 1459   CALCIUM 9.2 10/24/2022 1103   GFRNONAA >60 10/24/2022 1103   GFRAA >60 01/20/2017 1600    BNP No results found for: "BNP"   Imaging:  No results found.  Administration History     None          Latest Ref Rng & Units 06/30/2020   10:08 AM 03/19/2015    9:19 AM  PFT Results  FVC-Pre L 4.45  4.87   FVC-Predicted Pre % 91  93   FVC-Post L 4.57  4.92   FVC-Predicted Post % 94  94   Pre FEV1/FVC % % 76  74   Post FEV1/FCV % % 77  74   FEV1-Pre L 3.40  3.63   FEV1-Predicted Pre % 96  94   FEV1-Post L 3.50  3.66   DLCO uncorrected ml/min/mmHg 14.37  20.98   DLCO UNC% % 51  53   DLVA Predicted % 64  52   TLC L 7.09  7.56   TLC % Predicted % 90  94   RV % Predicted % 79  94     No results found for: "NITRICOXIDE"      Assessment & Plan:   Emphysema of lung (HCC) Emphysematous changes on imaging with reduced diffusion capacity and symptoms of chronic bronchitis. Difficulties with increased sputum production over the last few months. 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 No other infectious symptoms. Would prefer to hold off on further antimicrobial testing given recent C diff diagnosis and treatment for this with vancomycin . Will obtain repeat CXR today to rule out superimposed infection. Target mucociliary clearance therapies and challenge with steroid course. Initiate scheduled bronchodilator regimen - provided with samples of Breztri and new rx send. Teachback performed. Side effect profile reviewed. Check IgE and CBC with diff to assess for allergic component/peripheral eosinophils. Will get him set up for  repeat PFTs upon  return. Action plan in place.  Patient Instructions  Start Breztri 2 puffs Twice daily. Brush tongue and rinse mouth afterwards Continue Albuterol  inhaler 2 puffs or 3 mL neb every 6 hours as needed for shortness of breath or wheezing. Notify if symptoms persist despite rescue inhaler/neb use. I also sent in a nebulizer machine that you can use 1-2 times a day, or more often if having breathing difficulties, but this should help you clear out some of the phlegm  Use guaifenesin (Mucinex) [phone] mg Twice daily for cough/congestion Prednisone  40 mg daily for 5 days. Take in AM with food  Chest x ray and labs today   Repeat PFT - plan to stop your Breztri a few days before, if your breathing is doing okay  Follow up with oncology as scheduled   Follow up in 6 weeks after PFT with new pulmonologist or Katie Soham Hollett,NP. If symptoms do not improve or worsen, please contact office for sooner follow up or seek emergency care.    Asthmatic bronchitis , chronic (HCC) See above  Bronchiectasis without complication (HCC) Sputum production which is not acute in chronicity. Will check AFB and sputum culture. See above plan. Monitor while on ICS with underlying btx and increased risk for lung infections. Aggressive mucociliary clearance.   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 Follow up with oncology as scheduled   Advised if symptoms do not improve or worsen, to please contact office for sooner follow up or seek emergency care.   I spent 35 minutes of dedicated to the care of this patient on the date of this encounter to include pre-visit review of records, face-to-face time with the patient discussing conditions above, post visit ordering of testing, clinical documentation with the electronic health record, making appropriate referrals as documented, and communicating necessary findings to members of the patients care  team.  Roetta Clarke, NP 04/15/2024  Pt aware and understands NP's role.

## 2024-04-15 NOTE — Assessment & Plan Note (Signed)
 See above

## 2024-04-16 ENCOUNTER — Ambulatory Visit (HOSPITAL_COMMUNITY): Admission: RE | Admit: 2024-04-16 | Source: Home / Self Care | Admitting: Internal Medicine

## 2024-04-16 ENCOUNTER — Encounter (HOSPITAL_COMMUNITY): Admission: RE | Payer: Self-pay | Source: Home / Self Care

## 2024-04-16 LAB — CBC WITH DIFFERENTIAL/PLATELET
Basophils Absolute: 0 10*3/uL (ref 0.0–0.2)
Basos: 1 %
EOS (ABSOLUTE): 0.2 10*3/uL (ref 0.0–0.4)
Eos: 3 %
Hematocrit: 48 % (ref 37.5–51.0)
Hemoglobin: 15.9 g/dL (ref 13.0–17.7)
Immature Grans (Abs): 0 10*3/uL (ref 0.0–0.1)
Immature Granulocytes: 1 %
Lymphocytes Absolute: 1.4 10*3/uL (ref 0.7–3.1)
Lymphs: 17 %
MCH: 32.9 pg (ref 26.6–33.0)
MCHC: 33.1 g/dL (ref 31.5–35.7)
MCV: 99 fL — ABNORMAL HIGH (ref 79–97)
Monocytes Absolute: 0.7 10*3/uL (ref 0.1–0.9)
Monocytes: 9 %
Neutrophils Absolute: 6 10*3/uL (ref 1.4–7.0)
Neutrophils: 69 %
Platelets: 198 10*3/uL (ref 150–450)
RBC: 4.84 x10E6/uL (ref 4.14–5.80)
RDW: 12.3 % (ref 11.6–15.4)
WBC: 8.4 10*3/uL (ref 3.4–10.8)

## 2024-04-16 SURGERY — COLONOSCOPY
Anesthesia: Choice

## 2024-04-17 ENCOUNTER — Ambulatory Visit: Payer: Self-pay | Admitting: Nurse Practitioner

## 2024-04-17 NOTE — Progress Notes (Signed)
 Pt.notified

## 2024-04-18 ENCOUNTER — Other Ambulatory Visit: Payer: Self-pay | Admitting: Nurse Practitioner

## 2024-04-19 LAB — IGE: IgE (Immunoglobulin E), Serum: 122 [IU]/mL (ref 6–495)

## 2024-04-25 ENCOUNTER — Ambulatory Visit: Payer: Self-pay | Admitting: Nurse Practitioner

## 2024-04-25 NOTE — Progress Notes (Signed)
 IgE was normal

## 2024-05-12 NOTE — Patient Instructions (Signed)
 SURGICAL WAITING ROOM VISITATION  Patients having surgery or a procedure may have no more than 2 support people in the waiting area - these visitors may rotate.    Children under the age of 29 must have an adult with them who is not the patient.  Visitors with respiratory illnesses are discouraged from visiting and should remain at home.  If the patient needs to stay at the hospital during part of their recovery, the visitor guidelines for inpatient rooms apply. Pre-op nurse will coordinate an appropriate time for 1 support person to accompany patient in pre-op.  This support person may not rotate.    Please refer to the [facility] website for the visitor guidelines for Inpatients (after your surgery is over and you are in a regular room).       Your procedure is scheduled on: 05/20/24   Report to [facility] Main Entrance    Report to admitting at : 9:00 AM   Call this number if you have problems the morning of surgery [phone]   Do not eat food :After Midnight.   After Midnight you may have the following liquids until : 8;30 AM DAY OF SURGERY  Water  Non-Citrus Juices (without pulp, NO RED-Apple, White grape, White cranberry) Black Coffee (NO MILK/CREAM OR CREAMERS, sugar ok)  Clear Tea (NO MILK/CREAM OR CREAMERS, sugar ok) regular and decaf                             Plain Jell-O (NO RED)                                           Fruit ices (not with fruit pulp, NO RED)                                     Popsicles (NO RED)                                                               Sports drinks like Gatorade (NO RED)   The day of surgery:  Drink ONE (1) Pre-Surgery Clear Ensure at : 8:30 AM the morning of surgery. Drink in one sitting. Do not sip.  This drink was given to you during your hospital  pre-op appointment visit. Nothing else to drink after completing the  Pre-Surgery Clear Ensure or G2.          If you have questions, please contact your  surgeon's office.  FOLLOW ANY ADDITIONAL PRE OP INSTRUCTIONS YOU RECEIVED FROM YOUR SURGEON'S OFFICE!!!   Oral Hygiene is also important to reduce your risk of infection.                                    Remember - BRUSH YOUR TEETH THE MORNING OF SURGERY WITH YOUR REGULAR TOOTHPASTE  DENTURES WILL BE REMOVED PRIOR TO SURGERY PLEASE DO NOT APPLY "Poly grip" OR ADHESIVES!!!   Do NOT smoke after Midnight   Stop all  vitamins and herbal supplements 7 days before surgery.   Take these medicines the morning of surgery with A SIP OF WATER : escitalopram,amlodipine ,omeprazole.Use inhalers as usual and bring them.  These are anesthesia recommendations for holding your anticoagulants.  Please contact your prescribing physician to confirm IF it is safe to hold your anticoagulants for this length of time.   Eliquis Apixaban   72 hours   Xarelto  Rivaroxaban    72 hours  Plavix Clopidogrel   120 hours  Pletal Cilostazol   120 hours  HOLD Xarelto  after: 05/16/24  Bring CPAP mask and tubing day of surgery.                              You may not have any metal on your body including hair pins, jewelry, and body piercing             Do not wear lotions, powders, perfumes/cologne, or deodorant              Men may shave face and neck.   Do not bring valuables to the hospital. [facility] IS NOT             RESPONSIBLE   FOR VALUABLES.   Contacts, glasses, dentures or bridgework may not be worn into surgery.   Bring small overnight bag day of surgery.   DO NOT BRING YOUR HOME MEDICATIONS TO THE HOSPITAL. PHARMACY WILL DISPENSE MEDICATIONS LISTED ON YOUR MEDICATION LIST TO YOU DURING YOUR ADMISSION IN THE HOSPITAL!    Patients discharged on the day of surgery will not be allowed to drive home.  Someone NEEDS to stay with you for the first 24 hours after anesthesia.   Special Instructions: Bring a copy of your healthcare power of attorney and living will documents the day of surgery if you  haven't scanned them before.              Please read over the following fact sheets you were given: IF YOU HAVE QUESTIONS ABOUT YOUR PRE-OP INSTRUCTIONS PLEASE CALL [phone]   If you received a COVID test during your pre-op visit  it is requested that you wear a mask when out in public, stay away from anyone that may not be feeling well and notify your surgeon if you develop symptoms. If you test positive for Covid or have been in contact with anyone that has tested positive in the last 10 days please notify you surgeon.      Pre-operative 5 CHG Bath Instructions   You can play a key role in reducing the risk of infection after surgery. Your skin needs to be as free of germs as possible. You can reduce the number of germs on your skin by washing with CHG (chlorhexidine  gluconate) soap before surgery. CHG is an antiseptic soap that kills germs and continues to kill germs even after washing.   DO NOT use if you have an allergy to chlorhexidine /CHG or antibacterial soaps. If your skin becomes reddened or irritated, stop using the CHG and notify one of our RNs at [phone].   Please shower with the CHG soap starting 4 days before surgery using the following schedule:     Please keep in mind the following:  DO NOT shave, including legs and underarms, starting the day of your first shower.   You may shave your face at any point before/day of surgery.  Place clean sheets on your bed the day you  start using CHG soap. Use a clean washcloth (not used since being washed) for each shower. DO NOT sleep with pets once you start using the CHG.   CHG Shower Instructions:  If you choose to wash your hair and private area, wash first with your normal shampoo/soap.  After you use shampoo/soap, rinse your hair and body thoroughly to remove shampoo/soap residue.  Turn the water  OFF and apply about 3 tablespoons (45 ml) of CHG soap to a CLEAN washcloth.  Apply CHG soap ONLY FROM YOUR NECK DOWN TO  YOUR TOES (washing for 3-5 minutes)  DO NOT use CHG soap on face, private areas, open wounds, or sores.  Pay special attention to the area where your surgery is being performed.  If you are having back surgery, having someone wash your back for you may be helpful. Wait 2 minutes after CHG soap is applied, then you may rinse off the CHG soap.  Pat dry with a clean towel  Put on clean clothes/pajamas   If you choose to wear lotion, please use ONLY the CHG-compatible lotions on the back of this paper.     Additional instructions for the day of surgery: DO NOT APPLY any lotions, deodorants, cologne, or perfumes.   Put on clean/comfortable clothes.  Brush your teeth.  Ask your nurse before applying any prescription medications to the skin.   CHG Compatible Lotions   Aveeno Moisturizing lotion  Cetaphil Moisturizing Cream  Cetaphil Moisturizing Lotion  Clairol Herbal Essence Moisturizing Lotion, Dry Skin  Clairol Herbal Essence Moisturizing Lotion, Extra Dry Skin  Clairol Herbal Essence Moisturizing Lotion, Normal Skin  Curel Age Defying Therapeutic Moisturizing Lotion with Alpha Hydroxy  Curel Extreme Care Body Lotion  Curel Soothing Hands Moisturizing Hand Lotion  Curel Therapeutic Moisturizing Cream, Fragrance-Free  Curel Therapeutic Moisturizing Lotion, Fragrance-Free  Curel Therapeutic Moisturizing Lotion, Original Formula  Eucerin Daily Replenishing Lotion  Eucerin Dry Skin Therapy Plus Alpha Hydroxy Crme  Eucerin Dry Skin Therapy Plus Alpha Hydroxy Lotion  Eucerin Original Crme  Eucerin Original Lotion  Eucerin Plus Crme Eucerin Plus Lotion  Eucerin TriLipid Replenishing Lotion  Keri Anti-Bacterial Hand Lotion  Keri Deep Conditioning Original Lotion Dry Skin Formula Softly Scented  Keri Deep Conditioning Original Lotion, Fragrance Free Sensitive Skin Formula  Keri Lotion Fast Absorbing Fragrance Free Sensitive Skin Formula  Keri Lotion Fast Absorbing Softly Scented Dry  Skin Formula  Keri Original Lotion  Keri Skin Renewal Lotion Keri Silky Smooth Lotion  Keri Silky Smooth Sensitive Skin Lotion  Nivea Body Creamy Conditioning Oil  Nivea Body Extra Enriched Lotion  Nivea Body Original Lotion  Nivea Body Sheer Moisturizing Lotion Nivea Crme  Nivea Skin Firming Lotion  NutraDerm 30 Skin Lotion  NutraDerm Skin Lotion  NutraDerm Therapeutic Skin Cream  NutraDerm Therapeutic Skin Lotion  ProShield Protective Hand Cream  Provon moisturizing lotion   Incentive Spirometer  An incentive spirometer is a tool that can help keep your lungs clear and active. This tool measures how well you are filling your lungs with each breath. Taking long deep breaths may help reverse or decrease the chance of developing breathing (pulmonary) problems (especially infection) following: A long period of time when you are unable to move or be active. BEFORE THE PROCEDURE  If the spirometer includes an indicator to show your best effort, your nurse or respiratory therapist will set it to a desired goal. If possible, sit up straight or lean slightly forward. Try not to slouch. Hold the incentive spirometer in an  upright position. INSTRUCTIONS FOR USE  Sit on the edge of your bed if possible, or sit up as far as you can in bed or on a chair. Hold the incentive spirometer in an upright position. Breathe out normally. Place the mouthpiece in your mouth and seal your lips tightly around it. Breathe in slowly and as deeply as possible, raising the piston or the ball toward the top of the column. Hold your breath for 3-5 seconds or for as long as possible. Allow the piston or ball to fall to the bottom of the column. Remove the mouthpiece from your mouth and breathe out normally. Rest for a few seconds and repeat Steps 1 through 7 at least 10 times every 1-2 hours when you are awake. Take your time and take a few normal breaths between deep breaths. The spirometer may include an  indicator to show your best effort. Use the indicator as a goal to work toward during each repetition. After each set of 10 deep breaths, practice coughing to be sure your lungs are clear. If you have an incision (the cut made at the time of surgery), support your incision when coughing by placing a pillow or rolled up towels firmly against it. Once you are able to get out of bed, walk around indoors and cough well. You may stop using the incentive spirometer when instructed by your caregiver.  RISKS AND COMPLICATIONS Take your time so you do not get dizzy or light-headed. If you are in pain, you may need to take or ask for pain medication before doing incentive spirometry. It is harder to take a deep breath if you are having pain. AFTER USE Rest and breathe slowly and easily. It can be helpful to keep track of a log of your progress. Your caregiver can provide you with a simple table to help with this. If you are using the spirometer at home, follow these instructions: SEEK MEDICAL CARE IF:  You are having difficultly using the spirometer. You have trouble using the spirometer as often as instructed. Your pain medication is not giving enough relief while using the spirometer. You develop fever of 100.5 F (38.1 C) or higher. SEEK IMMEDIATE MEDICAL CARE IF:  You cough up bloody sputum that had not been present before. You develop fever of 102 F (38.9 C) or greater. You develop worsening pain at or near the incision site. MAKE SURE YOU:  Understand these instructions. Will watch your condition. Will get help right away if you are not doing well or get worse. Document Released: 04/02/2007 Document Revised: 02/12/2012 Document Reviewed: 06/03/2007 [facility] Patient Information 2014 [place], [place].   ________________________________________________________________________

## 2024-05-13 ENCOUNTER — Other Ambulatory Visit: Payer: Self-pay

## 2024-05-13 ENCOUNTER — Encounter (HOSPITAL_COMMUNITY)
Admission: RE | Admit: 2024-05-13 | Discharge: 2024-05-13 | Disposition: A | Discharge status: Home / Self Care | Source: Ambulatory Visit | Attending: Orthopedic Surgery | Admitting: Orthopedic Surgery

## 2024-05-13 ENCOUNTER — Encounter (HOSPITAL_COMMUNITY): Payer: Self-pay

## 2024-05-13 VITALS — BP 129/90 | HR 81 | Temp 97.8°F | Ht 74.0 in | Wt 215.0 lb

## 2024-05-13 DIAGNOSIS — Z01818 Encounter for other preprocedural examination: Secondary | ICD-10-CM | POA: Diagnosis present

## 2024-05-13 DIAGNOSIS — J449 Chronic obstructive pulmonary disease, unspecified: Secondary | ICD-10-CM | POA: Insufficient documentation

## 2024-05-13 DIAGNOSIS — Z8673 Personal history of transient ischemic attack (TIA), and cerebral infarction without residual deficits: Secondary | ICD-10-CM | POA: Insufficient documentation

## 2024-05-13 DIAGNOSIS — I4891 Unspecified atrial fibrillation: Secondary | ICD-10-CM | POA: Diagnosis not present

## 2024-05-13 DIAGNOSIS — I10 Essential (primary) hypertension: Secondary | ICD-10-CM | POA: Insufficient documentation

## 2024-05-13 DIAGNOSIS — Z01812 Encounter for preprocedural laboratory examination: Secondary | ICD-10-CM | POA: Diagnosis not present

## 2024-05-13 DIAGNOSIS — M1612 Unilateral primary osteoarthritis, left hip: Secondary | ICD-10-CM | POA: Insufficient documentation

## 2024-05-13 DIAGNOSIS — G4730 Sleep apnea, unspecified: Secondary | ICD-10-CM | POA: Insufficient documentation

## 2024-05-13 LAB — CBC
HCT: 47.8 % (ref 39.0–52.0)
Hemoglobin: 15.4 g/dL (ref 13.0–17.0)
MCH: 32.1 pg (ref 26.0–34.0)
MCHC: 32.2 g/dL (ref 30.0–36.0)
MCV: 99.6 fL (ref 80.0–100.0)
Platelets: 217 10*3/uL (ref 150–400)
RBC: 4.8 MIL/uL (ref 4.22–5.81)
RDW: 13.3 % (ref 11.5–15.5)
WBC: 11.7 10*3/uL — ABNORMAL HIGH (ref 4.0–10.5)
nRBC: 0 % (ref 0.0–0.2)

## 2024-05-13 LAB — SURGICAL PCR SCREEN
MRSA, PCR: NEGATIVE
Staphylococcus aureus: NEGATIVE

## 2024-05-13 LAB — BASIC METABOLIC PANEL WITH GFR
Anion gap: 9 (ref 5–15)
BUN: 22 mg/dL (ref 8–23)
CO2: 25 mmol/L (ref 22–32)
Calcium: 9 mg/dL (ref 8.9–10.3)
Chloride: 100 mmol/L (ref 98–111)
Creatinine, Ser: 1.16 mg/dL (ref 0.61–1.24)
GFR, Estimated: >60 mL/min (ref >60)
Glucose, Bld: 91 mg/dL (ref 70–99)
Potassium: 4.2 mmol/L (ref 3.5–5.1)
Sodium: 134 mmol/L — ABNORMAL LOW (ref 135–145)

## 2024-05-13 NOTE — Progress Notes (Addendum)
 For Anesthesia: PCP - [name], NP LOV: 03/10/24  Cardiologist - Laurann Pollock, MD LOV: 06/20/23  Clearance: Lasalle Pointer: NP: 02/07/24  Diamond Formica, MD : Pulmonology  Bowel Prep reminder:  Chest x-ray - 04/15/24. CT Chest: 04/16/23 EKG - 06/20/23  Stress Test -  ECHO - 06/30/20 Cardiac Cath -  Pacemaker/ICD device last checked: Pacemaker orders received: Device Rep notified:  Spinal Cord Stimulator:N/A  Sleep Study - Yes CPAP - NO  Fasting Blood Sugar - N/A Checks Blood Sugar ____0_ times a day Date and result of last Hgb A1c- 5.9: 03/10/24  Last dose of GLP1 agonist- N/A GLP1 instructions:   Last dose of SGLT-2 inhibitors- N/A SGLT-2 instructions:   Blood Thinner Instructions: Xarelto : will be hold after 05/16/24 Aspirin Instructions: Last Dose:  Activity level: Can go up a flight of stairs and activities of daily living without stopping and without chest pain and/or shortness of breath   Able to exercise without chest pain and/or shortness of breath   Unable to go up a flight of stairs without chest pain and/or shortness of breath     Anesthesia review: Hx: COPD,Smoker,CAD,OSA((NO CPAP),HTN,Afib,Stroke,Emphysema,aortic aneurism   Patient denies shortness of breath, fever, cough and chest pain at PAT appointment   Patient verbalized understanding of instructions that were given to them at the PAT appointment. Patient was also instructed that they will need to review over the PAT instructions again at home before surgery.

## 2024-05-14 NOTE — Progress Notes (Signed)
 Anesthesia Chart Review   Case: [id number] Date/Time: 05/20/24 1115   Procedure: ARTHROPLASTY, HIP, TOTAL, ANTERIOR APPROACH (Left: Hip)   Anesthesia type: Spinal   Pre-op diagnosis: Left hip osteoarthritis   Location: [facility] ROOM 10 / [facility]   Surgeons: Claiborne Crew, MD       DISCUSSION:83 y.o. former smoker with h/o COPD, sleep apnea, HTN, stroke, atrial fibrillation, left hip OA scheduled for above procedure 05/20/2024 with Dr. Claiborne Crew.   Pt previously postponed from April due to Cdiff. Pt has completed treatment, sx have improved.   Pt seen by pulmonology 03/07/2024. Per OV note, He is more bronchitis than asthma since resumed smoking w/in the last year but really doesn't have much copd at all so should do well with surgery with the following recs Stop all smoking x 2 weeks preop  Mucinex or mucinex dm up to 1200 mg bid prn  ABC action plan - see avs for instructions unique to this ov   Per cardiology preoperative evaluation 02/07/2024, Mr. Renz perioperative risk of a major cardiac event is 6.6% according to the Revised Cardiac Risk Index (RCRI).  Therefore, he is at high risk for perioperative complications.   His functional capacity is poor at 3.63 METs according to the Duke Activity Status Index (DASI). Recommendations: The patient is at high risk for perioperative cardiac complications and is at a low functional capacity.  However, further testing will not change how his cardiac status is managed.  Proceed with surgery at high risk if there are no other options for treatment. Antiplatelet and/or Anticoagulation Recommendations: Xarelto  (Rivaroxaban ) can be held for  3 days prior to surgery.  Please resume post op when felt to be safe.  Pt reports last dose of Xarelto  05/16/24.  VS: BP (!) 129/90   Pulse 81   Temp 36.6 C (Oral)   Ht 6' 2 (1.88 m)   Wt 97.5 kg   SpO2 94%   BMI 27.60 kg/m   PROVIDERS: [name], NP is PCP   Cardiologist - Branch, Joyceann No,  MD  LABS: Labs reviewed: Acceptable for surgery. (all labs ordered are listed, but only abnormal results are displayed)  Labs Reviewed  BASIC METABOLIC PANEL WITH GFR - Abnormal; Notable for the following components:      Result Value   Sodium 134 (*)    All other components within normal limits  CBC - Abnormal; Notable for the following components:   WBC 11.7 (*)    All other components within normal limits  SURGICAL PCR SCREEN  TYPE AND SCREEN     IMAGES:   EKG:   CV: Echo 06/30/2020 1. Left ventricular ejection fraction, by estimation, is 55 to 60%. The  left ventricle has normal function. The left ventricle has no regional  wall motion abnormalities. There is moderate left ventricular hypertrophy.  Left ventricular diastolic  parameters are indeterminate in the setting of atrial fibrillation.   2. Right ventricular systolic function is normal. The right ventricular  size is normal.   3. Left atrial size was mildly dilated.   4. The mitral valve is grossly normal. Trivial mitral valve  regurgitation.   5. The aortic valve is tricuspid. Aortic valve regurgitation is not  visualized. Mild aortic valve sclerosis is present, with no evidence of  aortic valve stenosis.   6. The inferior vena cava is normal in size with greater than 50%  respiratory variability, suggesting right atrial pressure of 3 mmHg.  Past Medical History:  Diagnosis  Date   A-fib [facility])    Arthritis    right hand   Bradycardia    COPD (chronic obstructive pulmonary disease) (HCC)    Dyspnea    Dysrhythmia    Frequent urination    GERD (gastroesophageal reflux disease)    Hypercholesterolemia    Hypertension    Pneumonia    Pre-diabetes    Prostate cancer (HCC)    RADIATION TX AND SCHEDULED FOR SEED IMPLANTS 02-28-12   Sleep apnea    uses a cpap   Stroke (HCC)    2011;ST memory loss, no other deficits.   Urinary urgency     Past Surgical History:  Procedure Laterality Date   BRONCHIAL  BIOPSY  10/24/2022   Procedure: BRONCHIAL BIOPSIES;  Surgeon: Prudy Brownie, DO;  Location: [facility];  Service: Pulmonary;;   BRONCHIAL NEEDLE ASPIRATION BIOPSY  10/24/2022   Procedure: BRONCHIAL NEEDLE ASPIRATION BIOPSIES;  Surgeon: Prudy Brownie, DO;  Location: [facility];  Service: Pulmonary;;   BRONCHIAL WASHINGS  10/24/2022   Procedure: BRONCHIAL WASHINGS;  Surgeon: Prudy Brownie, DO;  Location: [facility];  Service: Pulmonary;;   CATARACT EXTRACTION W/PHACO Right 11/06/2013   Procedure: CATARACT EXTRACTION PHACO AND INTRAOCULAR LENS PLACEMENT (IOC);  Surgeon: Anner Kill, MD;  Location: [facility];  Service: Ophthalmology;  Laterality: Right;  CDE 11.67   CATARACT EXTRACTION W/PHACO Left 11/25/2015   Procedure: CATARACT EXTRACTION PHACO AND INTRAOCULAR LENS PLACEMENT ; CDE:  8.15;  Surgeon: Anner Kill, MD;  Location: [facility];  Service: Ophthalmology;  Laterality: Left;   COLONOSCOPY  10/03/2006   [id number] rectal polyps cold biopsied/removed, otherwise normal rectum/Sigmoid diverticula.  Remainder of colon mucosa appeared normal   COLONOSCOPY N/A 08/19/2014   RMR: Radiation proctitis-status post APC ablation. Colonic diverticulosis.   CYSTOSCOPY  02/28/2012   Procedure: CYSTOSCOPY;  Surgeon: Livingston Rigg, MD;  Location[facility];  Service: Urology;  Laterality: N/A;   no seeds noted in bladder   MELANOMA EXCISION Right    neck   RADIOACTIVE SEED IMPLANT  02/28/2012   Procedure: RADIOACTIVE SEED IMPLANT;  Surgeon: Livingston Rigg, MD;  Location[facility];  Service: Urology;  Laterality: N/A;  67seeds implanted     MEDICATIONS:  albuterol  (PROVENTIL ) (2.5 MG/3ML) 0.083% nebulizer solution   albuterol  (VENTOLIN  HFA) 108 (90 Base) MCG/ACT inhaler   amLODipine  (NORVASC ) 2.5 MG tablet   APPLE CIDER VINEGAR PO   b complex vitamins capsule   budeson-glycopyrrolate -formoterol  (BREZTRI  AEROSPHERE) 160-9-4.8 MCG/ACT AERO inhaler    budeson-glycopyrrolate -formoterol  (BREZTRI  AEROSPHERE) 160-9-4.8 MCG/ACT AERO inhaler   escitalopram (LEXAPRO) 5 MG tablet   metoprolol  succinate (TOPROL  XL) 25 MG 24 hr tablet   MILK THISTLE PO   omeprazole (PRILOSEC) 20 MG capsule   rivaroxaban  (XARELTO ) 20 MG TABS tablet   simvastatin (ZOCOR) 40 MG tablet   solifenacin (VESICARE) 10 MG tablet   tadalafil  (CIALIS ) 10 MG tablet   tamsulosin (FLOMAX) 0.4 MG CAPS capsule   No current facility-administered medications for this encounter.     Chick Cotton Ward, PA-C [facility] [phone]

## 2024-05-14 NOTE — Anesthesia Preprocedure Evaluation (Addendum)
 Anesthesia Evaluation  Patient identified by MRN, date of birth, ID band Patient awake    Reviewed: Allergy & Precautions, NPO status , Patient's Chart, lab work & pertinent test results, reviewed documented beta blocker date and time   History of Anesthesia Complications Negative for: history of anesthetic complications  Airway Mallampati: III  TM Distance: >3 FB Neck ROM: Full    Dental no notable dental hx.    Pulmonary asthma , sleep apnea and Continuous Positive Airway Pressure Ventilation , COPD, former smoker   Pulmonary exam normal        Cardiovascular hypertension, Pt. on medications and Pt. on home beta blockers Normal cardiovascular exam+ dysrhythmias (on Xarelto ) Atrial Fibrillation   Echo 06/30/2020: EF 55-60%, moderate LVH, mild LAE, valves ok    Neuro/Psych    Depression    CVA (2011)    GI/Hepatic Neg liver ROS,GERD  Medicated,,  Endo/Other  negative endocrine ROS    Renal/GU negative Renal ROS  negative genitourinary   Musculoskeletal  (+) Arthritis ,    Abdominal   Peds  Hematology negative hematology ROS (+)   Anesthesia Other Findings Day of surgery medications reviewed with patient.  Reproductive/Obstetrics negative OB ROS                             Anesthesia Physical Anesthesia Plan  ASA: 3  Anesthesia Plan: Spinal   Post-op Pain Management: Tylenol  PO (pre-op)*   Induction:   PONV Risk Score and Plan: 2 and Treatment may vary due to age or medical condition, Ondansetron , Propofol  infusion and Dexamethasone   Airway Management Planned: Natural Airway and Simple Face Mask  Additional Equipment: None  Intra-op Plan:   Post-operative Plan:   Informed Consent: I have reviewed the patients History and Physical, chart, labs and discussed the procedure including the risks, benefits and alternatives for the proposed anesthesia with the patient or authorized  representative who has indicated his/her understanding and acceptance.       Plan Discussed with: CRNA  Anesthesia Plan Comments: (See PAT note 05/13/2024)       Anesthesia Quick Evaluation

## 2024-05-19 NOTE — H&P (Signed)
 TOTAL HIP ADMISSION H&P  Patient is admitted for left total hip arthroplasty.  Therapy Plans: HEP Disposition: Home with daughter (staying with him temporarily) Planned DVT Prophylaxis: Xarelto  20 mg DME needed: none PCP: [name] - clearance received Cardio: Considered high risk, but cleared by Dr. Amanda Jungling Pulmonology - recs below: - Stop all smoking x 2 weeks preop - Mucinex or mucinex dm up to 1200 mg bid prn   TXA: IV Allergies: NKDA Anesthesia Concerns: none BMI: 27.6 Last HgbA1c: 5.9% not diabetic  Other: - smoker - abx - oxycodone, robaxin, tylenol  -  Had C diff recently which caused surgery to be cancelled - has been treated  Subjective:  Chief Complaint: left hip pain  HPI: Jonathan Greene, 83 y.o. male, has a history of pain and functional disability in the left hip(s) due to arthritis and patient has failed non-surgical conservative treatments for greater than 12 weeks to include NSAID's and/or analgesics and activity modification.  Onset of symptoms was gradual starting 2 years ago with gradually worsening course since that time.The patient noted no past surgery on the left hip(s).  Patient currently rates pain in the left hip at 8 out of 10 with activity. Patient has worsening of pain with activity and weight bearing, pain that interfers with activities of daily living, and pain with passive range of motion. Patient has evidence of joint space narrowing by imaging studies. This condition presents safety issues increasing the risk of falls. There is no current active infection.  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) 02/02/2024   Osteoarthritis of left hip 01/11/2024   Melanoma of right side of neck (HCC) 01/01/2024   Pain of left hip joint 11/23/2023   Mood disorder (HCC) 09/04/2023   Lung  cancer (HCC) 03/21/2023   Thoracic aortic aneurysm (HCC) 03/21/2023   OSA (obstructive sleep apnea) 12/21/2022   COPD (chronic obstructive pulmonary disease) (HCC) 11/02/2022   Lung nodule 09/21/2022   Benign prostatic hyperplasia with nocturia 01/18/2022   Moderate episode of recurrent major depressive disorder (HCC) 01/18/2022   Mixed hyperlipidemia 01/18/2022   Gastroesophageal reflux disease without esophagitis 01/18/2022   Chronic pain in left shoulder 01/18/2022   COPD with acute exacerbation (HCC) 09/30/2021   CAP (community acquired pneumonia) 09/30/2021   Hemoptysis 09/30/2021   Hardening of the aorta (main artery of the heart) (HCC) 09/16/2021   Prediabetes 09/16/2021   Mass of upper lobe of left lung 06/08/2020   Diarrhea 12/31/2019   Melena 12/31/2019   Essential hypertension 12/11/2016   Bradycardia 12/11/2016   Emphysema of lung (HCC) 12/11/2016   A-fib (HCC) 02/11/2015   Hematochezia 08/04/2014   Stroke (cerebrum) (HCC) 12/13/2013   Aneurysm (HCC) 12/12/2013   Carotid artery dissection (HCC) 12/12/2013   Prostate cancer (HCC) 12/07/2011   Past Medical History:  Diagnosis Date   A-fib (HCC)    Arthritis    right hand   Bradycardia    COPD (chronic obstructive pulmonary disease) (HCC)    Dyspnea    Dysrhythmia    Frequent urination    GERD (gastroesophageal reflux disease)    Hypercholesterolemia    Hypertension    Pneumonia    Pre-diabetes    Prostate cancer (HCC)    RADIATION TX AND SCHEDULED FOR SEED IMPLANTS 02-28-12   Sleep apnea    uses a cpap  Stroke (HCC)    2011;ST memory loss, no other deficits.   Urinary urgency     Past Surgical History:  Procedure Laterality Date   BRONCHIAL BIOPSY  10/24/2022   Procedure: BRONCHIAL BIOPSIES;  Surgeon: Prudy Brownie, DO;  Location: [facility];  Service: Pulmonary;;   BRONCHIAL NEEDLE ASPIRATION BIOPSY  10/24/2022   Procedure: BRONCHIAL NEEDLE ASPIRATION BIOPSIES;  Surgeon: Prudy Brownie, DO;   Location: [facility];  Service: Pulmonary;;   BRONCHIAL WASHINGS  10/24/2022   Procedure: BRONCHIAL WASHINGS;  Surgeon: Prudy Brownie, DO;  Location: [facility];  Service: Pulmonary;;   CATARACT EXTRACTION W/PHACO Right 11/06/2013   Procedure: CATARACT EXTRACTION PHACO AND INTRAOCULAR LENS PLACEMENT (IOC);  Surgeon: Anner Kill, MD;  Location: [facility];  Service: Ophthalmology;  Laterality: Right;  CDE 11.67   CATARACT EXTRACTION W/PHACO Left 11/25/2015   Procedure: CATARACT EXTRACTION PHACO AND INTRAOCULAR LENS PLACEMENT ; CDE:  8.15;  Surgeon: Anner Kill, MD;  Location: [facility];  Service: Ophthalmology;  Laterality: Left;   COLONOSCOPY  10/03/2006   QMV:HQIONGEXBM rectal polyps cold biopsied/removed, otherwise normal rectum/Sigmoid diverticula.  Remainder of colon mucosa appeared normal   COLONOSCOPY N/A 08/19/2014   RMR: Radiation proctitis-status post APC ablation. Colonic diverticulosis.   CYSTOSCOPY  02/28/2012   Procedure: CYSTOSCOPY;  Surgeon: Livingston Rigg, MD;  Location: [facility];  Service: Urology;  Laterality: N/A;   no seeds noted in bladder   MELANOMA EXCISION Right    neck   RADIOACTIVE SEED IMPLANT  02/28/2012   Procedure: RADIOACTIVE SEED IMPLANT;  Surgeon: Livingston Rigg, MD;  Location: [facility];  Service: Urology;  Laterality: N/A;  67seeds implanted     No current facility-administered medications for this encounter.   Current Outpatient Medications  Medication Sig Dispense Refill Last Dose/Taking   albuterol  (VENTOLIN  HFA) 108 (90 Base) MCG/ACT inhaler Inhale 1-2 puffs into the lungs every 4 (four) hours as needed. 18 g 1 Taking As Needed   amLODipine  (NORVASC ) 2.5 MG tablet Take 2.5 mg by mouth daily.   Taking   APPLE CIDER VINEGAR PO Take 450 mg by mouth in the morning and at bedtime.   Taking   b complex vitamins capsule Take 1 capsule by mouth in the morning.   Taking   escitalopram (LEXAPRO) 5 MG tablet Take 5 mg by mouth in  the morning.   Taking   metoprolol  succinate (TOPROL  XL) 25 MG 24 hr tablet Take 1 tablet (25 mg total) by mouth daily. (Patient taking differently: Take 25 mg by mouth at bedtime.) 30 tablet 11 Taking Differently   MILK THISTLE PO Take 1 capsule by mouth in the morning and at bedtime.   Taking   omeprazole (PRILOSEC) 20 MG capsule Take 20 mg by mouth daily.   Taking   rivaroxaban  (XARELTO ) 20 MG TABS tablet Take 1 tablet (20 mg total) by mouth daily with supper. 90 tablet 4 Taking   simvastatin (ZOCOR) 40 MG tablet Take 40 mg by mouth every other day. At bedtime.   Taking   solifenacin (VESICARE) 10 MG tablet Take 10 mg by mouth in the morning.   Taking   tamsulosin (FLOMAX) 0.4 MG CAPS capsule Take 0.4 mg by mouth at bedtime.   Taking   albuterol  (PROVENTIL ) (2.5 MG/3ML) 0.083% nebulizer solution Take 3 mLs (2.5 mg total) by nebulization every 6 (six) hours as needed for wheezing or shortness of breath. 75 mL 3    budeson-glycopyrrolate -formoterol  (BREZTRI  AEROSPHERE)  160-9-4.8 MCG/ACT AERO inhaler Inhale 2 puffs into the lungs in the morning and at bedtime. 10.7 g 11    budeson-glycopyrrolate -formoterol  (BREZTRI  AEROSPHERE) 160-9-4.8 MCG/ACT AERO inhaler Inhale 2 puffs into the lungs in the morning and at bedtime.      tadalafil  (CIALIS ) 10 MG tablet Take 1 tablet (10 mg total) by mouth daily as needed. 10 tablet 2 Not Taking   No Known Allergies  Social History   Tobacco Use   Smoking status: Former    Current packs/day: 0.00    Average packs/day: 2.0 packs/day for 35.0 years (70.0 ttl pk-yrs)    Types: Cigarettes    Start date: 02/21/1956    Quit date: 05/04/2024    Years since quitting: 0.0   Smokeless tobacco: Current    Types: Chew   Tobacco comments:    CHEW TOBACCO FOR 20 YRS   Substance Use Topics   Alcohol use: Yes    Alcohol/week: 2.0 - 4.0 standard drinks of alcohol    Types: 2 - 4 Shots of liquor per week    Comment: 4 ounces daily    Family History  Problem Relation  Age of Onset   Stroke Mother    Hypertension Mother    Colon cancer Sister        in her 52s   Breast cancer Sister    COPD Sister    Hypertension Sister    Pneumonia Sister    Heart attack Brother    COPD Son    COPD Maternal Aunt    Cancer Neg Hx    Liver disease Neg Hx      Review of Systems  Constitutional:  Negative for chills and fever.  Respiratory:  Negative for cough and shortness of breath.   Cardiovascular:  Negative for chest pain.  Gastrointestinal:  Negative for nausea and vomiting.  Musculoskeletal:  Positive for arthralgias.     Objective:  Physical Exam Well nourished and well developed. General: Alert and oriented x3, cooperative and pleasant, no acute distress.  Musculoskeletal: Left hip exam: Pain with hip flexion internal rotation over 5 degrees with pelvic tilting due to pain, external rotation to 20 degrees Active hip flexion with weakness due to pain and 5 -/5 strength over 110 degrees No significant lower extremity edema, erythema or calf tenderness  Vital signs in last 24 hours:    Labs:   Estimated body mass index is 27.6 kg/m as calculated from the following:   Height as of 05/13/24: 6' 2 (1.88 m).   Weight as of 05/13/24: 97.5 kg.   Imaging Review Plain radiographs demonstrate severe degenerative joint disease of the left hip(s). The bone quality appears to be adequate for age and reported activity level.      Assessment/Plan:  End stage arthritis, left hip(s)  The patient history, physical examination, clinical judgement of the provider and imaging studies are consistent with end stage degenerative joint disease of the left hip(s) and total hip arthroplasty is deemed medically necessary. The treatment options including medical management, injection therapy, arthroscopy and arthroplasty were discussed at length. The risks and benefits of total hip arthroplasty were presented and reviewed. The risks due to aseptic loosening,  infection, stiffness, dislocation/subluxation,  thromboembolic complications and other imponderables were discussed.  The patient acknowledged the explanation, agreed to proceed with the plan and consent was signed. Patient is being admitted for inpatient treatment for surgery, pain control, PT, OT, prophylactic antibiotics, VTE prophylaxis, progressive ambulation and ADL's and discharge planning.The patient  is planning to be discharged home.   Kim Pen, PA-C Orthopedic Surgery [facility] [place] [phone]

## 2024-05-20 ENCOUNTER — Other Ambulatory Visit: Payer: Self-pay

## 2024-05-20 ENCOUNTER — Encounter (HOSPITAL_COMMUNITY): Payer: Self-pay | Admitting: Orthopedic Surgery

## 2024-05-20 ENCOUNTER — Ambulatory Visit (HOSPITAL_BASED_OUTPATIENT_CLINIC_OR_DEPARTMENT_OTHER): Admitting: Anesthesiology

## 2024-05-20 ENCOUNTER — Ambulatory Visit (HOSPITAL_COMMUNITY)

## 2024-05-20 ENCOUNTER — Encounter (HOSPITAL_COMMUNITY)
Admission: RE | Disposition: A | Discharge status: Home / Self Care | Payer: Self-pay | Source: Home / Self Care | Attending: Orthopedic Surgery

## 2024-05-20 ENCOUNTER — Ambulatory Visit (HOSPITAL_COMMUNITY): Payer: Self-pay | Admitting: Physician Assistant

## 2024-05-20 ENCOUNTER — Observation Stay (HOSPITAL_COMMUNITY)
Admission: RE | Admit: 2024-05-20 | Discharge: 2024-05-21 | Disposition: A | Discharge status: Home / Self Care | Payer: Medicare Other | Attending: Orthopedic Surgery | Admitting: Orthopedic Surgery

## 2024-05-20 ENCOUNTER — Observation Stay (HOSPITAL_COMMUNITY)

## 2024-05-20 DIAGNOSIS — R7303 Prediabetes: Secondary | ICD-10-CM

## 2024-05-20 DIAGNOSIS — I10 Essential (primary) hypertension: Secondary | ICD-10-CM | POA: Diagnosis not present

## 2024-05-20 DIAGNOSIS — M1612 Unilateral primary osteoarthritis, left hip: Principal | ICD-10-CM | POA: Insufficient documentation

## 2024-05-20 DIAGNOSIS — Z8546 Personal history of malignant neoplasm of prostate: Secondary | ICD-10-CM | POA: Insufficient documentation

## 2024-05-20 DIAGNOSIS — Z85118 Personal history of other malignant neoplasm of bronchus and lung: Secondary | ICD-10-CM | POA: Insufficient documentation

## 2024-05-20 DIAGNOSIS — Z79899 Other long term (current) drug therapy: Secondary | ICD-10-CM | POA: Insufficient documentation

## 2024-05-20 DIAGNOSIS — J449 Chronic obstructive pulmonary disease, unspecified: Secondary | ICD-10-CM | POA: Diagnosis not present

## 2024-05-20 DIAGNOSIS — Z7901 Long term (current) use of anticoagulants: Secondary | ICD-10-CM | POA: Insufficient documentation

## 2024-05-20 DIAGNOSIS — I4891 Unspecified atrial fibrillation: Secondary | ICD-10-CM | POA: Diagnosis not present

## 2024-05-20 DIAGNOSIS — Z87891 Personal history of nicotine dependence: Secondary | ICD-10-CM | POA: Diagnosis not present

## 2024-05-20 DIAGNOSIS — G4733 Obstructive sleep apnea (adult) (pediatric): Secondary | ICD-10-CM

## 2024-05-20 DIAGNOSIS — Z8673 Personal history of transient ischemic attack (TIA), and cerebral infarction without residual deficits: Secondary | ICD-10-CM | POA: Insufficient documentation

## 2024-05-20 DIAGNOSIS — Z96642 Presence of left artificial hip joint: Secondary | ICD-10-CM

## 2024-05-20 HISTORY — PX: TOTAL HIP ARTHROPLASTY: SHX124

## 2024-05-20 LAB — TYPE AND SCREEN
ABO/RH(D): O POS
Antibody Screen: NEGATIVE

## 2024-05-20 SURGERY — ARTHROPLASTY, HIP, TOTAL, ANTERIOR APPROACH
Anesthesia: Spinal | Site: Hip | Laterality: Left

## 2024-05-20 MED ORDER — FENTANYL CITRATE (PF) 100 MCG/2ML IJ SOLN
INTRAMUSCULAR | Status: DC | PRN
  Administered 2024-05-20: 100 ug via INTRAVENOUS

## 2024-05-20 MED ORDER — CEFAZOLIN SODIUM-DEXTROSE 2-4 GM/100ML-% IV SOLN
2.0000 g | INTRAVENOUS | Status: AC
  Administered 2024-05-20: 2 g via INTRAVENOUS
  Filled 2024-05-20: qty 100

## 2024-05-20 MED ORDER — 0.9 % SODIUM CHLORIDE (POUR BTL) OPTIME
TOPICAL | Status: DC | PRN
  Administered 2024-05-20: 1000 mL

## 2024-05-20 MED ORDER — CHLORHEXIDINE GLUCONATE 0.12 % MT SOLN
15.0000 mL | Freq: Once | OROMUCOSAL | Status: AC
  Administered 2024-05-20: 15 mL via OROMUCOSAL

## 2024-05-20 MED ORDER — KETOROLAC TROMETHAMINE 30 MG/ML IJ SOLN
INTRAMUSCULAR | Status: DC | PRN
  Administered 2024-05-20: 30 mg via INTRA_ARTICULAR

## 2024-05-20 MED ORDER — ALUM & MAG HYDROXIDE-SIMETH 200-200-20 MG/5ML PO SUSP: 30.0000 mL | ORAL | Status: DC | PRN

## 2024-05-20 MED ORDER — ACETAMINOPHEN 500 MG PO TABS
1000.0000 mg | ORAL_TABLET | Freq: Four times a day (QID) | ORAL | Status: DC
Start: 2024-05-20 — End: 2024-05-21
  Administered 2024-05-20 – 2024-05-21 (×3): 1000 mg via ORAL
  Filled 2024-05-20 (×3): qty 2

## 2024-05-20 MED ORDER — FESOTERODINE FUMARATE ER 4 MG PO TB24
4.0000 mg | ORAL_TABLET | Freq: Every day | ORAL | Status: DC
  Administered 2024-05-20 – 2024-05-21 (×2): 4 mg via ORAL
  Filled 2024-05-20 (×2): qty 1

## 2024-05-20 MED ORDER — BISACODYL 10 MG RE SUPP: 10.0000 mg | Freq: Every day | RECTAL | Status: DC | PRN

## 2024-05-20 MED ORDER — PANTOPRAZOLE SODIUM 40 MG PO TBEC
40.0000 mg | DELAYED_RELEASE_TABLET | Freq: Every day | ORAL | Status: DC
  Administered 2024-05-21: 40 mg via ORAL
  Filled 2024-05-20: qty 1

## 2024-05-20 MED ORDER — SODIUM CHLORIDE 0.9% FLUSH: 3.0000 mL | INTRAVENOUS | Status: DC | PRN

## 2024-05-20 MED ORDER — DROPERIDOL 2.5 MG/ML IJ SOLN: 0.6250 mg | Freq: Once | INTRAMUSCULAR | Status: DC | PRN

## 2024-05-20 MED ORDER — METHOCARBAMOL 1000 MG/10ML IJ SOLN: 500.0000 mg | Freq: Four times a day (QID) | INTRAMUSCULAR | Status: DC | PRN

## 2024-05-20 MED ORDER — SODIUM CHLORIDE 0.9% FLUSH
3.0000 mL | Freq: Two times a day (BID) | INTRAVENOUS | Status: DC
  Administered 2024-05-21: 10 mL via INTRAVENOUS

## 2024-05-20 MED ORDER — METOCLOPRAMIDE HCL 5 MG PO TABS: 5.0000 mg | ORAL_TABLET | Freq: Three times a day (TID) | ORAL | Status: DC | PRN

## 2024-05-20 MED ORDER — OXYCODONE HCL 5 MG PO TABS
10.0000 mg | ORAL_TABLET | ORAL | Status: DC | PRN
  Administered 2024-05-21 (×2): 15 mg via ORAL
  Filled 2024-05-20 (×2): qty 3
  Filled 2024-05-20: qty 2

## 2024-05-20 MED ORDER — FENTANYL CITRATE (PF) 100 MCG/2ML IJ SOLN
INTRAMUSCULAR | Status: AC
  Filled 2024-05-20: qty 2

## 2024-05-20 MED ORDER — RIVAROXABAN 10 MG PO TABS: 20.0000 mg | ORAL_TABLET | Freq: Every day | ORAL | Status: DC

## 2024-05-20 MED ORDER — HYDROMORPHONE HCL 1 MG/ML IJ SOLN
0.5000 mg | INTRAMUSCULAR | Status: DC | PRN
  Administered 2024-05-20: 1 mg via INTRAVENOUS
  Filled 2024-05-20: qty 1

## 2024-05-20 MED ORDER — ONDANSETRON HCL 4 MG PO TABS: 4.0000 mg | ORAL_TABLET | Freq: Four times a day (QID) | ORAL | Status: DC | PRN

## 2024-05-20 MED ORDER — TRANEXAMIC ACID-NACL 1000-0.7 MG/100ML-% IV SOLN
1000.0000 mg | INTRAVENOUS | Status: AC
Start: 2024-05-20 — End: 2024-05-20
  Administered 2024-05-20: 1000 mg via INTRAVENOUS
  Filled 2024-05-20: qty 100

## 2024-05-20 MED ORDER — SIMVASTATIN 40 MG PO TABS
40.0000 mg | ORAL_TABLET | ORAL | Status: DC
  Administered 2024-05-21: 40 mg via ORAL
  Filled 2024-05-20: qty 1

## 2024-05-20 MED ORDER — BUPIVACAINE IN DEXTROSE 0.75-8.25 % IT SOLN
INTRATHECAL | Status: DC | PRN
  Administered 2024-05-20: 1.8 mL via INTRATHECAL

## 2024-05-20 MED ORDER — ASPIRIN 81 MG PO CHEW
81.0000 mg | CHEWABLE_TABLET | Freq: Two times a day (BID) | ORAL | Status: DC
Start: 2024-05-20 — End: 2024-05-20

## 2024-05-20 MED ORDER — MENTHOL 3 MG MT LOZG: 1.0000 | LOZENGE | OROMUCOSAL | Status: DC | PRN

## 2024-05-20 MED ORDER — BUDESON-GLYCOPYRROL-FORMOTEROL 160-9-4.8 MCG/ACT IN AERO
2.0000 | INHALATION_SPRAY | Freq: Two times a day (BID) | RESPIRATORY_TRACT | Status: DC
  Administered 2024-05-20 – 2024-05-21 (×2): 2 via RESPIRATORY_TRACT
  Filled 2024-05-20: qty 5.9

## 2024-05-20 MED ORDER — PROPOFOL 10 MG/ML IV BOLUS
INTRAVENOUS | Status: AC
  Filled 2024-05-20: qty 20

## 2024-05-20 MED ORDER — DIPHENHYDRAMINE HCL 12.5 MG/5ML PO ELIX: 12.5000 mg | ORAL_SOLUTION | ORAL | Status: DC | PRN

## 2024-05-20 MED ORDER — DEXAMETHASONE SODIUM PHOSPHATE 10 MG/ML IJ SOLN: 8.0000 mg | Freq: Once | INTRAMUSCULAR | Status: DC

## 2024-05-20 MED ORDER — METOCLOPRAMIDE HCL 5 MG/ML IJ SOLN: 5.0000 mg | Freq: Three times a day (TID) | INTRAMUSCULAR | Status: DC | PRN

## 2024-05-20 MED ORDER — CEFAZOLIN SODIUM-DEXTROSE 2-4 GM/100ML-% IV SOLN
2.0000 g | Freq: Four times a day (QID) | INTRAVENOUS | Status: AC
  Administered 2024-05-20 (×2): 2 g via INTRAVENOUS
  Filled 2024-05-20 (×2): qty 100

## 2024-05-20 MED ORDER — BUPIVACAINE-EPINEPHRINE (PF) 0.25% -1:200000 IJ SOLN
INTRAMUSCULAR | Status: DC | PRN
  Administered 2024-05-20: 30 mL via PERINEURAL

## 2024-05-20 MED ORDER — ORAL CARE MOUTH RINSE: 15.0000 mL | Freq: Once | OROMUCOSAL | Status: AC

## 2024-05-20 MED ORDER — BUPIVACAINE-EPINEPHRINE (PF) 0.25% -1:200000 IJ SOLN
INTRAMUSCULAR | Status: AC
  Filled 2024-05-20: qty 30

## 2024-05-20 MED ORDER — ESCITALOPRAM OXALATE 10 MG PO TABS
5.0000 mg | ORAL_TABLET | Freq: Every day | ORAL | Status: DC
  Administered 2024-05-21: 5 mg via ORAL
  Filled 2024-05-20: qty 1

## 2024-05-20 MED ORDER — TAMSULOSIN HCL 0.4 MG PO CAPS
0.4000 mg | ORAL_CAPSULE | Freq: Every day | ORAL | Status: DC
  Administered 2024-05-20: 0.4 mg via ORAL
  Filled 2024-05-20: qty 1

## 2024-05-20 MED ORDER — TRANEXAMIC ACID-NACL 1000-0.7 MG/100ML-% IV SOLN
1000.0000 mg | Freq: Once | INTRAVENOUS | Status: AC
Start: 2024-05-20 — End: 2024-05-21
  Administered 2024-05-20: 1000 mg via INTRAVENOUS
  Filled 2024-05-20: qty 100

## 2024-05-20 MED ORDER — METHOCARBAMOL 500 MG PO TABS
500.0000 mg | ORAL_TABLET | Freq: Four times a day (QID) | ORAL | Status: DC | PRN
Start: 2024-05-20 — End: 2024-05-21
  Administered 2024-05-20 – 2024-05-21 (×3): 500 mg via ORAL
  Filled 2024-05-20 (×3): qty 1

## 2024-05-20 MED ORDER — OXYCODONE HCL 5 MG PO TABS: 5.0000 mg | ORAL_TABLET | Freq: Once | ORAL | Status: DC | PRN

## 2024-05-20 MED ORDER — OXYCODONE HCL 5 MG PO TABS
5.0000 mg | ORAL_TABLET | ORAL | Status: DC | PRN
  Administered 2024-05-20: 5 mg via ORAL
  Administered 2024-05-20 – 2024-05-21 (×2): 10 mg via ORAL
  Filled 2024-05-20: qty 1
  Filled 2024-05-20: qty 2

## 2024-05-20 MED ORDER — STERILE WATER FOR IRRIGATION IR SOLN
Status: DC | PRN
  Administered 2024-05-20: 2000 mL

## 2024-05-20 MED ORDER — AMLODIPINE BESYLATE 5 MG PO TABS
2.5000 mg | ORAL_TABLET | Freq: Every day | ORAL | Status: DC
  Administered 2024-05-21: 2.5 mg via ORAL
  Filled 2024-05-20: qty 1

## 2024-05-20 MED ORDER — FENTANYL CITRATE PF 50 MCG/ML IJ SOSY: 25.0000 ug | PREFILLED_SYRINGE | INTRAMUSCULAR | Status: DC | PRN

## 2024-05-20 MED ORDER — SODIUM CHLORIDE (PF) 0.9 % IJ SOLN
INTRAMUSCULAR | Status: AC
  Filled 2024-05-20: qty 30

## 2024-05-20 MED ORDER — KETOROLAC TROMETHAMINE 30 MG/ML IJ SOLN
INTRAMUSCULAR | Status: AC
  Filled 2024-05-20: qty 1

## 2024-05-20 MED ORDER — ACETAMINOPHEN 500 MG PO TABS: 1000.0000 mg | ORAL_TABLET | Freq: Once | ORAL | Status: DC

## 2024-05-20 MED ORDER — PHENOL 1.4 % MT LIQD: 1.0000 | OROMUCOSAL | Status: DC | PRN

## 2024-05-20 MED ORDER — SENNA 8.6 MG PO TABS
2.0000 | ORAL_TABLET | Freq: Every day | ORAL | Status: DC
  Administered 2024-05-20: 17.2 mg via ORAL
  Filled 2024-05-20: qty 2

## 2024-05-20 MED ORDER — SODIUM CHLORIDE (PF) 0.9 % IJ SOLN
INTRAMUSCULAR | Status: DC | PRN
  Administered 2024-05-20: 30 mL

## 2024-05-20 MED ORDER — PROPOFOL 500 MG/50ML IV EMUL
INTRAVENOUS | Status: DC | PRN
  Administered 2024-05-20: 50 ug/kg/min via INTRAVENOUS

## 2024-05-20 MED ORDER — DEXAMETHASONE SODIUM PHOSPHATE 10 MG/ML IJ SOLN
10.0000 mg | Freq: Once | INTRAMUSCULAR | Status: AC
  Administered 2024-05-21: 10 mg via INTRAVENOUS
  Filled 2024-05-20: qty 1

## 2024-05-20 MED ORDER — OXYCODONE HCL 5 MG/5ML PO SOLN: 5.0000 mg | Freq: Once | ORAL | Status: DC | PRN

## 2024-05-20 MED ORDER — ALBUTEROL SULFATE (2.5 MG/3ML) 0.083% IN NEBU: 3.0000 mL | INHALATION_SOLUTION | RESPIRATORY_TRACT | Status: DC | PRN

## 2024-05-20 MED ORDER — METOPROLOL SUCCINATE ER 25 MG PO TB24
25.0000 mg | ORAL_TABLET | Freq: Every day | ORAL | Status: DC
  Administered 2024-05-20: 25 mg via ORAL
  Filled 2024-05-20: qty 1

## 2024-05-20 MED ORDER — ONDANSETRON HCL 4 MG/2ML IJ SOLN
INTRAMUSCULAR | Status: DC | PRN
  Administered 2024-05-20: 4 mg via INTRAVENOUS

## 2024-05-20 MED ORDER — ONDANSETRON HCL 4 MG/2ML IJ SOLN
INTRAMUSCULAR | Status: AC
  Filled 2024-05-20: qty 2

## 2024-05-20 MED ORDER — LACTATED RINGERS IV SOLN: INTRAVENOUS | Status: DC

## 2024-05-20 MED ORDER — DEXAMETHASONE SODIUM PHOSPHATE 10 MG/ML IJ SOLN
INTRAMUSCULAR | Status: AC
  Filled 2024-05-20: qty 1

## 2024-05-20 MED ORDER — LIDOCAINE HCL (CARDIAC) PF 100 MG/5ML IV SOSY
PREFILLED_SYRINGE | INTRAVENOUS | Status: DC | PRN
  Administered 2024-05-20: 40 mg via INTRAVENOUS

## 2024-05-20 MED ORDER — ONDANSETRON HCL 4 MG/2ML IJ SOLN: 4.0000 mg | Freq: Four times a day (QID) | INTRAMUSCULAR | Status: DC | PRN

## 2024-05-20 MED ORDER — DEXAMETHASONE SODIUM PHOSPHATE 10 MG/ML IJ SOLN
INTRAMUSCULAR | Status: DC | PRN
  Administered 2024-05-20: 10 mg via INTRAVENOUS

## 2024-05-20 MED ORDER — POLYETHYLENE GLYCOL 3350 17 G PO PACK
17.0000 g | PACK | Freq: Two times a day (BID) | ORAL | Status: DC
  Administered 2024-05-20 – 2024-05-21 (×2): 17 g via ORAL
  Filled 2024-05-20 (×2): qty 1

## 2024-05-20 MED ORDER — PHENYLEPHRINE HCL-NACL 20-0.9 MG/250ML-% IV SOLN
INTRAVENOUS | Status: DC | PRN
  Administered 2024-05-20: 50 ug/min via INTRAVENOUS

## 2024-05-20 MED ORDER — PROPOFOL 10 MG/ML IV BOLUS
INTRAVENOUS | Status: DC | PRN
  Administered 2024-05-20 (×4): 20 mg via INTRAVENOUS

## 2024-05-20 MED ORDER — POVIDONE-IODINE 10 % EX SWAB: 2.0000 | Freq: Once | CUTANEOUS | Status: DC

## 2024-05-20 SURGICAL SUPPLY — 38 items
BAG COUNTER SPONGE SURGICOUNT (BAG) IMPLANT
BAG ZIPLOCK 12X15 (MISCELLANEOUS) ×2 IMPLANT
BALL HIP ARTICU EZE 36 8.5 (Hips) IMPLANT
BLADE SAG 18X100X1.27 (BLADE) ×2 IMPLANT
COVER PERINEAL POST (MISCELLANEOUS) ×2 IMPLANT
COVER SURGICAL LIGHT HANDLE (MISCELLANEOUS) ×2 IMPLANT
CUP ACET PINNCLE SECTR II 62MM (Hips) IMPLANT
DERMABOND ADVANCED .7 DNX12 (GAUZE/BANDAGES/DRESSINGS) ×2 IMPLANT
DRAPE FOOT SWITCH (DRAPES) ×2 IMPLANT
DRAPE STERI IOBAN 125X83 (DRAPES) ×2 IMPLANT
DRAPE U-SHAPE 47X51 STRL (DRAPES) ×4 IMPLANT
DRESSING AQUACEL AG SP 3.5X10 (GAUZE/BANDAGES/DRESSINGS) ×2 IMPLANT
DRSG AQUACEL AG ADV 3.5X10 (GAUZE/BANDAGES/DRESSINGS) IMPLANT
DURAPREP 26ML APPLICATOR (WOUND CARE) ×2 IMPLANT
ELECT REM PT RETURN 15FT ADLT (MISCELLANEOUS) ×2 IMPLANT
GLOVE BIO SURGEON STRL SZ 6 (GLOVE) ×2 IMPLANT
GLOVE BIOGEL PI IND STRL 6.5 (GLOVE) ×2 IMPLANT
GLOVE BIOGEL PI IND STRL 7.5 (GLOVE) ×2 IMPLANT
GLOVE ORTHO TXT STRL SZ7.5 (GLOVE) ×4 IMPLANT
GOWN STRL REUS W/ TWL LRG LVL3 (GOWN DISPOSABLE) ×4 IMPLANT
HOLDER FOLEY CATH W/STRAP (MISCELLANEOUS) ×2 IMPLANT
KIT TURNOVER KIT A (KITS) ×6 IMPLANT
LINER NEUTRAL 62MMC36MM P4 (Liner) IMPLANT
MANIFOLD NEPTUNE II (INSTRUMENTS) ×2 IMPLANT
NDL SAFETY ECLIPSE 18X1.5 (NEEDLE) ×2 IMPLANT
PACK ANTERIOR HIP CUSTOM (KITS) ×2 IMPLANT
PENCIL SMOKE EVACUATOR (MISCELLANEOUS) ×2 IMPLANT
SCREW 6.5MMX30MM (Screw) IMPLANT
STEM FEM ACTIS HIGH SZ10 (Stem) IMPLANT
SUT MNCRL AB 4-0 PS2 18 (SUTURE) ×2 IMPLANT
SUT VIC AB 1 CT1 36 (SUTURE) ×6 IMPLANT
SUT VIC AB 2-0 CT1 TAPERPNT 27 (SUTURE) ×4 IMPLANT
SUTURE STRATFX 0 PDS 27 VIOLET (SUTURE) ×2 IMPLANT
SYR 3ML LL SCALE MARK (SYRINGE) ×2 IMPLANT
TOWEL GREEN STERILE FF (TOWEL DISPOSABLE) ×2 IMPLANT
TRAY FOLEY MTR SLVR 16FR STAT (SET/KITS/TRAYS/PACK) ×2 IMPLANT
TUBE SUCTION HIGH CAP CLEAR NV (SUCTIONS) ×2 IMPLANT
WATER STERILE IRR 1000ML POUR (IV SOLUTION) ×2 IMPLANT

## 2024-05-20 NOTE — Anesthesia Procedure Notes (Signed)
 Date/Time: 05/20/2024 11:32 AM  Performed by: Vella Gey, CRNAOxygen Delivery Method: Simple face mask

## 2024-05-20 NOTE — Evaluation (Signed)
 Physical Therapy Evaluation Patient Details Name: Jonathan Greene MRN: [id number] DOB: 1941-09-16 Today's Date: 05/20/2024  History of Present Illness  Pt is 83 yo male s/p L anterior THA on 05/20/24.  Pt with hx including but not limited to bronchiectasis, L femur aseptic necrosis, OA, lung CA, OSA, COPD, HLD, GERD, COPD, CAP, CVA, carotid artery dissection  Clinical Impression  Pt is s/p THA resulting in the deficits listed below (see PT Problem List). Pt reports he has had decline in mobility over the past few months due to hip pain and feels deconditioned.  Reports still could ambulate with RW and perform ADLs but daughter staying with him for several months to assist as needed.  Today, pt with good pain control and motivated to move.  Min A to stand but then ambulated 89' with RW and CGA.  Pt expected to progress well with PT.  Pt will benefit from acute skilled PT to increase their independence and safety with mobility to facilitate discharge.          If plan is discharge home, recommend the following: A little help with walking and/or transfers;A little help with bathing/dressing/bathroom;Assistance with cooking/housework;Help with stairs or ramp for entrance   Can travel by private vehicle        Equipment Recommendations None recommended by PT  Recommendations for Other Services       Functional Status Assessment Patient has had a recent decline in their functional status and demonstrates the ability to make significant improvements in function in a reasonable and predictable amount of time.     Precautions / Restrictions Precautions Precautions: Fall Restrictions Weight Bearing Restrictions Per Provider Order: Yes LLE Weight Bearing Per Provider Order: Weight bearing as tolerated      Mobility  Bed Mobility Overal bed mobility: Needs Assistance Bed Mobility: Supine to Sit     Supine to sit: Min assist, HOB elevated          Transfers Overall transfer level:  Needs assistance Equipment used: Rolling walker (2 wheels) Transfers: Sit to/from Stand Sit to Stand: From elevated surface, Min assist           General transfer comment: Light min A to steady    Ambulation/Gait Ambulation/Gait assistance: Contact guard assist Gait Distance (Feet): 80 Feet Assistive device: Rolling walker (2 wheels) Gait Pattern/deviations: Step-through pattern Gait velocity: decreased but functional     General Gait Details: Min cues for RW proximity and posture; tolerated well  Stairs            Wheelchair Mobility     Tilt Bed    Modified Rankin (Stroke Patients Only)       Balance Overall balance assessment: Needs assistance Sitting-balance support: No upper extremity supported Sitting balance-Leahy Scale: Good     Standing balance support: Bilateral upper extremity supported, Reliant on assistive device for balance Standing balance-Leahy Scale: Poor Standing balance comment: steady with RW                             Pertinent Vitals/Pain Pain Assessment Pain Assessment: Faces Faces Pain Scale: Hurts a little bit Pain Location: L hip Pain Descriptors / Indicators: Burning, Sharp Pain Intervention(s): Limited activity within patient's tolerance, Monitored during session, Premedicated before session, Repositioned, Ice applied (mostly burnign sensation, one sharp pain with transfer but eased)    Home Living Family/patient expects to be discharged to:: Private residence Living Arrangements: Alone Available Help at Discharge:  Family;Available 24 hours/day (daughter staying) Type of Home: House Home Access: Stairs to enter Entrance Stairs-Rails: Right;Left;Can reach both Entrance Stairs-Number of Steps: 3   Home Layout: One level Home Equipment: Shower seat;Grab bars - tub/shower;Rolling Environmental consultant (2 wheels)      Prior Function Prior Level of Function : Needs assist             Mobility Comments: Pt could  ambulate; used RW ADLs Comments: Reports indepedent with adls (does report difficult b/c of hip pain); daughter assist with iadls     Extremity/Trunk Assessment   Upper Extremity Assessment Upper Extremity Assessment: Overall WFL for tasks assessed    Lower Extremity Assessment Lower Extremity Assessment: LLE deficits/detail;RLE deficits/detail RLE Deficits / Details: ROM WFl; MMT 5/5 LLE Deficits / Details: Expected post op changes; ROM WFL; MMT ankle 5/5, knee 3/5, hip 2/5    Cervical / Trunk Assessment Cervical / Trunk Assessment: Normal  Communication        Cognition Arousal: Alert Behavior During Therapy: WFL for tasks assessed/performed   PT - Cognitive impairments: No apparent impairments                                 Cueing       General Comments General comments (skin integrity, edema, etc.): VSS    Exercises     Assessment/Plan    PT Assessment Patient needs continued PT services  PT Problem List Decreased strength;Pain;Decreased range of motion;Decreased activity tolerance;Decreased balance;Decreased mobility;Decreased knowledge of use of DME       PT Treatment Interventions DME instruction;Therapeutic exercise;Gait training;Stair training;Functional mobility training;Therapeutic activities;Patient/family education;Balance training;Modalities    PT Goals (Current goals can be found in the Care Plan section)  Acute Rehab PT Goals Patient Stated Goal: return home; get stronger PT Goal Formulation: With patient Time For Goal Achievement: 06/03/24 Potential to Achieve Goals: Good    Frequency 7X/week     Co-evaluation               AM-PAC PT 6 Clicks Mobility  Outcome Measure Help needed turning from your back to your side while in a flat bed without using bedrails?: A Little Help needed moving from lying on your back to sitting on the side of a flat bed without using bedrails?: A Little Help needed moving to and from a bed  to a chair (including a wheelchair)?: A Little Help needed standing up from a chair using your arms (e.g., wheelchair or bedside chair)?: A Little Help needed to walk in hospital room?: A Little Help needed climbing 3-5 steps with a railing? : A Little 6 Click Score: 18    End of Session Equipment Utilized During Treatment: Gait belt Activity Tolerance: Patient tolerated treatment well Patient left: with chair alarm set;in chair;with call bell/phone within reach Nurse Communication: Mobility status PT Visit Diagnosis: Other abnormalities of gait and mobility (R26.89);Muscle weakness (generalized) (M62.81)    Time: 4132-4401 PT Time Calculation (min) (ACUTE ONLY): 20 min   Charges:   PT Evaluation $PT Eval Low Complexity: 1 Low   PT General Charges $$ ACUTE PT VISIT: 1 Visit         Cyd Dowse, PT Acute Rehab [facility] [phone]   Carolynn Citrin 05/20/2024, 5:34 PM

## 2024-05-20 NOTE — Transfer of Care (Signed)
 Immediate Anesthesia Transfer of Care Note  Patient: Jonathan Greene  Procedure(s) Performed: ARTHROPLASTY, HIP, TOTAL, ANTERIOR APPROACH (Left: Hip)  Patient Location: PACU  Anesthesia Type:Spinal  Level of Consciousness: awake and alert   Airway & Oxygen Therapy: Patient Spontanous Breathing and Patient connected to face mask oxygen  Post-op Assessment: Report given to RN and Post -op Vital signs reviewed and stable  Post vital signs: Reviewed and stable  Last Vitals:  Vitals Value Taken Time  BP 108/74 05/20/24 13:12  Temp    Pulse 54 05/20/24 13:13  Resp 22 05/20/24 13:13  SpO2 99 % 05/20/24 13:13  Vitals shown include unfiled device data.  Last Pain:  Vitals:   05/20/24 0916  TempSrc: Oral  PainSc:          Complications: No notable events documented.

## 2024-05-20 NOTE — Anesthesia Postprocedure Evaluation (Signed)
 Anesthesia Post Note  Patient: Jonathan Greene  Procedure(s) Performed: ARTHROPLASTY, HIP, TOTAL, ANTERIOR APPROACH (Left: Hip)     Patient location during evaluation: PACU Anesthesia Type: Spinal Level of consciousness: awake and alert Pain management: pain level controlled Vital Signs Assessment: post-procedure vital signs reviewed and stable Respiratory status: spontaneous breathing, nonlabored ventilation and respiratory function stable Cardiovascular status: blood pressure returned to baseline Postop Assessment: no apparent nausea or vomiting, spinal receding, no headache and no backache Anesthetic complications: no   No notable events documented.  Last Vitals:  Vitals:   05/20/24 1445 05/20/24 1510  BP: 133/78 (!) 136/95  Pulse: 60 64  Resp: 19 20  Temp: (!) 36.4 C (!) 36.4 C  SpO2: 97% 96%    Last Pain:  Vitals:   05/20/24 1618  TempSrc:   PainSc: 4                  Rayfield Cairo

## 2024-05-20 NOTE — Discharge Instructions (Signed)

## 2024-05-20 NOTE — Interval H&P Note (Signed)
 History and Physical Interval Note:  05/20/2024 9:02 AM  Jonathan Greene  has presented today for surgery, with the diagnosis of Left hip osteoarthritis.  The various methods of treatment have been discussed with the patient and family. After consideration of risks, benefits and other options for treatment, the patient has consented to  Procedure(s): ARTHROPLASTY, HIP, TOTAL, ANTERIOR APPROACH (Left) as a surgical intervention.  The patient's history has been reviewed, patient examined, no change in status, stable for surgery.  I have reviewed the patient's chart and labs.  Questions were answered to the patient's satisfaction.     Bevin Bucks

## 2024-05-20 NOTE — Anesthesia Procedure Notes (Signed)
 Spinal  Patient location during procedure: OR Start time: 05/20/2024 11:32 AM End time: 05/20/2024 11:36 AM Reason for block: surgical anesthesia Staffing Performed: resident/CRNA  Resident/CRNA: Vella Gey, CRNA Performed by: Vella Gey, CRNA Authorized by: Vernadine Golas, MD   Preanesthetic Checklist Completed: patient identified, IV checked, site marked, risks and benefits discussed, surgical consent, monitors and equipment checked, pre-op evaluation and timeout performed Spinal Block Patient position: sitting Prep: DuraPrep and site prepped and draped Patient monitoring: heart rate, continuous pulse ox and blood pressure Approach: midline Location: L4-5 Injection technique: single-shot Needle Needle type: Pencan  Needle gauge: 24 G Needle length: 10 cm Assessment Sensory level: T6 Events: CSF return Additional Notes Kit expiration date 06/02/2025 and lot #[id number]  Clear free flow of CSF, negative heme, negative paresthesia Tolerated well and returned to supine position

## 2024-05-20 NOTE — Op Note (Signed)
 NAME:  HOOVER GREWE.: [id number]      MEDICAL RECORD NO.: [id number]      FACILITY:  [facility]      PHYSICIAN:  Bevin Bucks  DATE OF BIRTH:  04-27-1941     DATE OF PROCEDURE:  05/20/2024                                 OPERATIVE REPORT         PREOPERATIVE DIAGNOSIS: Left  hip osteoarthritis.      POSTOPERATIVE DIAGNOSIS:  Left hip osteoarthritis.      PROCEDURE:  Left total hip replacement through an anterior approach   utilizing DePuy THR system, component size 62 mm pinnacle cup, a size 36+4 neutral   Altrex liner, a size 10 Hi Actis stem with a 36+8.5 Articuleze metal head ball.      SURGEON:  Azalea Lento. Bernard Brick, M.D.      ASSISTANT:  Kim Pen, PA-C     ANESTHESIA:  Spinal.      SPECIMENS:  None.      COMPLICATIONS:  None.      BLOOD LOSS:  250 cc     DRAINS:  None.      INDICATION OF THE PROCEDURE:  Jonathan Greene is a 83 y.o. male who had   presented to office for evaluation of left hip pain.  Radiographs revealed   progressive degenerative changes with bone-on-bone   articulation of the  hip joint, including subchondral cystic changes and osteophytes.  The patient had painful limited range of   motion significantly affecting their overall quality of life and function.  The patient was failing to    respond to conservative measures including medications and/or injections and activity modification and at this point was ready   to proceed with more definitive measures.  Consent was obtained for   benefit of pain relief.  Specific risks of infection, DVT, component   failure, dislocation, neurovascular injury, and need for revision surgery were reviewed in the office.     PROCEDURE IN DETAIL:  The patient was brought to operative theater.   Once adequate anesthesia, preoperative antibiotics, 2 gm of Ancef, 1 gm of Tranexamic Acid, and 10 mg of Decadron  were administered, the patient was positioned supine on  the [employer] table.  Once the patient was safely positioned with adequate padding of boney prominences we predraped out the hip, and used fluoroscopy to confirm orientation of the pelvis.      The left hip was then prepped and draped from proximal iliac crest to   mid thigh with a shower curtain technique.      Time-out was performed identifying the patient, planned procedure, and the appropriate extremity.     An incision was then made 2 cm lateral to the   anterior superior iliac spine extending over the orientation of the   tensor fascia lata muscle and sharp dissection was carried down to the   fascia of the muscle.      The fascia was then incised.  The muscle belly was identified and swept   laterally and retractor placed along the superior neck.  Following   cauterization of the circumflex vessels and removing some pericapsular   fat, a second cobra retractor was placed on the inferior neck.  A T-capsulotomy was made along the line of the   superior neck to the trochanteric fossa, then extended proximally and   distally.  Tag sutures were placed and the retractors were then placed   intracapsular.  We then identified the trochanteric fossa and   orientation of my neck cut and then made a neck osteotomy with the femur on traction.  The femoral   head was removed without difficulty or complication.  Traction was let   off and retractors were placed posterior and anterior around the   acetabulum.      The labrum and foveal tissue were debrided.  I began reaming with a 53 mm   reamer and reamed up to 61 mm reamer with good bony bed preparation and a 62 mm  cup was chosen.  The final 62 mm Pinnacle cup was then impacted under fluoroscopy to confirm the depth of penetration and orientation with respect to   Abduction and forward flexion.  A screw was placed into the ilium followed by the hole eliminator.  The final   36+4 neutral Altrex liner was impacted with good visualized rim fit.   The cup was positioned anatomically within the acetabular portion of the pelvis.      At this point, the femur was rolled to 100 degrees.  Further capsule was   released off the inferior aspect of the femoral neck.  I then   released the superior capsule proximally.  With the leg in a neutral position the hook was placed laterally   along the femur under the vastus lateralis origin and elevated manually and then held in position using the hook attachment on the bed.  The leg was then extended and adducted with the leg rolled to 100   degrees of external rotation.  Retractors were placed along the medial calcar and posteriorly over the greater trochanter.  Once the proximal femur was fully   exposed, I used a box osteotome to set orientation.  I then began   broaching with the starting chili pepper broach and passed this by hand and then broached up to 10.  With the 10 broach in place I chose a high offset neck and did several trial reductions.  The offset was appropriate, leg lengths   appeared to be equal best matched with the +8.5 head ball trial confirmed radiographically.   Given these findings, I went ahead and dislocated the hip, repositioned all   retractors and positioned the right hip in the extended and abducted position.  The final 10 Hi Actis stem was   chosen and it was impacted down to the level of neck cut.  Based on this   and the trial reductions, a final 36+8.5 Articuleze metal head ball was chosen and   impacted onto a clean and dry trunnion, and the hip was reduced.  The   hip had been irrigated throughout the case again at this point.  I did   reapproximate the superior capsular leaflet to the anterior leaflet   using #1 Vicryl.  The fascia of the   tensor fascia lata muscle was then reapproximated using #1 Vicryl and #0 Stratafix sutures.  The   remaining wound was closed with 2-0 Vicryl and running 4-0 Monocryl.   The hip was cleaned, dried, and dressed sterilely using  Dermabond and   Aquacel dressing.  The patient was then brought   to recovery room in stable condition tolerating the procedure well.    Kim Pen,  PA-C was present for the entirety of the case involved from   preoperative positioning, perioperative retractor management, general   facilitation of the case, as well as primary wound closure as assistant.            Azalea Lento Bernard Brick, M.D.        05/20/2024 9:02 AM

## 2024-05-21 ENCOUNTER — Encounter (HOSPITAL_COMMUNITY): Payer: Self-pay | Admitting: Orthopedic Surgery

## 2024-05-21 ENCOUNTER — Encounter (HOSPITAL_COMMUNITY)

## 2024-05-21 DIAGNOSIS — M1612 Unilateral primary osteoarthritis, left hip: Secondary | ICD-10-CM | POA: Diagnosis not present

## 2024-05-21 LAB — BASIC METABOLIC PANEL WITH GFR
Anion gap: 9 (ref 5–15)
BUN: 23 mg/dL (ref 8–23)
CO2: 24 mmol/L (ref 22–32)
Calcium: 9 mg/dL (ref 8.9–10.3)
Chloride: 102 mmol/L (ref 98–111)
Creatinine, Ser: 1.26 mg/dL — ABNORMAL HIGH (ref 0.61–1.24)
GFR, Estimated: 57 mL/min — ABNORMAL LOW (ref >60)
Glucose, Bld: 158 mg/dL — ABNORMAL HIGH (ref 70–99)
Potassium: 5.2 mmol/L — ABNORMAL HIGH (ref 3.5–5.1)
Sodium: 135 mmol/L (ref 135–145)

## 2024-05-21 LAB — CBC
HCT: 42 % (ref 39.0–52.0)
Hemoglobin: 13.6 g/dL (ref 13.0–17.0)
MCH: 32.2 pg (ref 26.0–34.0)
MCHC: 32.4 g/dL (ref 30.0–36.0)
MCV: 99.3 fL (ref 80.0–100.0)
Platelets: 190 10*3/uL (ref 150–400)
RBC: 4.23 MIL/uL (ref 4.22–5.81)
RDW: 12.9 % (ref 11.5–15.5)
WBC: 21.6 10*3/uL — ABNORMAL HIGH (ref 4.0–10.5)
nRBC: 0 % (ref 0.0–0.2)

## 2024-05-21 MED ORDER — OXYCODONE HCL 5 MG PO TABS: 5.0000 mg | ORAL_TABLET | ORAL | 0 refills | Status: DC | PRN

## 2024-05-21 MED ORDER — METHOCARBAMOL 500 MG PO TABS: 500.0000 mg | ORAL_TABLET | Freq: Four times a day (QID) | ORAL | 2 refills | Status: DC | PRN

## 2024-05-21 MED ORDER — SENNA 8.6 MG PO TABS: 2.0000 | ORAL_TABLET | Freq: Every day | ORAL | 0 refills | Status: AC

## 2024-05-21 MED ORDER — POLYETHYLENE GLYCOL 3350 17 G PO PACK: 17.0000 g | PACK | Freq: Two times a day (BID) | ORAL | 0 refills | Status: DC

## 2024-05-21 NOTE — Progress Notes (Signed)
 PT TX NOTE  05/21/24 1400  PT Visit Information  Last PT Received On 05/21/24  Assistance Needed Pt making excellent progress, meeting PT goals. Reviewed stairs and mobility as below as well as THA HEP and progression.  Pt is ready to d/c home with family assist prn from PT standpoint.   History of Present Illness Pt is 83 yo male s/p L anterior THA on 05/20/24.  Pt with hx including but not limited to bronchiectasis, L femur aseptic necrosis, OA, lung CA, OSA, COPD, HLD, GERD, COPD, CAP, CVA, carotid artery dissection  Subjective Data  Patient Stated Goal return home; get stronger  Precautions  Precautions Fall  Restrictions  Weight Bearing Restrictions Per Provider Order No  LLE Weight Bearing Per Provider Order WBAT  Pain Assessment  Pain Assessment Faces  Faces Pain Scale 2  Pain Location L hip  Pain Descriptors / Indicators Burning;Sore  Pain Intervention(s) Limited activity within patient's tolerance;Monitored during session;Repositioned;Premedicated before session  Cognition  Arousal Alert  Behavior During Therapy WFL for tasks assessed/performed  PT - Cognitive impairments No apparent impairments  Following Commands  Following commands Intact  Cueing  Cueing Techniques Verbal cues  Communication  Communication No apparent difficulties  Bed Mobility  Overal bed mobility Needs Assistance  Bed Mobility Sit to Supine  Sit to supine Supervision  General bed mobility comments incr time, able to complete by self assisting LLE with gait belt  Transfers  Overall transfer level Needs assistance  Equipment used Rolling walker (2 wheels)  Transfers Sit to/from Stand  Sit to Stand Supervision  General transfer comment cues for hand placement  Ambulation/Gait  Ambulation/Gait assistance Supervision  Gait Distance (Feet) 80 Feet  Assistive device Rolling walker (2 wheels)  Gait Pattern/deviations Step-to pattern;Step-through pattern  General Gait Details cues for RW proximity and  posture; gait progression--beginning step through gait pattern end of distance  Gait velocity decreased but functional  Stairs Yes  Stairs assistance Contact guard assist;Supervision  Stair Management Two rails;Step to pattern;Forwards  Number of Stairs 2 (x2)  General stair comments cues for sequence, no physical assist other than RW placement;  Total Joint Exercises  Ankle Circles/Pumps AROM;Both;10 reps  Quad Sets AROM;Both;10 reps  Heel Slides AAROM;Left;10 reps  Hip ABduction/ADduction AAROM;Left;10 reps  PT - End of Session  Equipment Utilized During Treatment Gait belt  Activity Tolerance Patient tolerated treatment well  Patient left in chair;with call bell/phone within reach;with family/visitor present  Nurse Communication Mobility status   PT - Assessment/Plan  PT Visit Diagnosis Other abnormalities of gait and mobility (R26.89);Muscle weakness (generalized) (M62.81)  PT Frequency (ACUTE ONLY) 7X/week  Follow Up Recommendations Follow physician's recommendations for discharge plan and follow up therapies  Patient can return home with the following A little help with walking and/or transfers;A little help with bathing/dressing/bathroom;Assistance with cooking/housework;Help with stairs or ramp for entrance  PT equipment None recommended by PT  AM-PAC PT 6 Clicks Mobility Outcome Measure (Version 2)  Help needed turning from your back to your side while in a flat bed without using bedrails? 3  Help needed moving from lying on your back to sitting on the side of a flat bed without using bedrails? 3  Help needed moving to and from a bed to a chair (including a wheelchair)? 3  Help needed standing up from a chair using your arms (e.g., wheelchair or bedside chair)? 3  Help needed to walk in hospital room? 3  Help needed climbing 3-5 steps with a railing?  3  6 Click Score 18  Consider Recommendation of Discharge To: Home with [facility]  PT Goal Progression  Progress towards PT goals  Progressing toward goals  Acute Rehab PT Goals  PT Goal Formulation With patient  Time For Goal Achievement 06/03/24  Potential to Achieve Goals Good  PT Time Calculation  PT Start Time (ACUTE ONLY) 1355  PT Stop Time (ACUTE ONLY) 1429  PT Time Calculation (min) (ACUTE ONLY) 34 min  PT General Charges  $$ ACUTE PT VISIT 1 Visit  PT Treatments  $Gait Training 8-22 mins  $Therapeutic Exercise 8-22 mins

## 2024-05-21 NOTE — Care Management Obs Status (Signed)
 MEDICARE OBSERVATION STATUS NOTIFICATION   Patient Details  Name: Jonathan Greene MRN: [id number] Date of Birth: 12-Feb-1941   Medicare Observation Status Notification Given:  Yes    Bari Leys, RN 05/21/2024, 9:44 AM

## 2024-05-21 NOTE — TOC Transition Note (Signed)
 Transition of Care [facility]) - Discharge Note   Patient Details  Name: Jonathan Greene MRN: [id number] Date of Birth: [birth year]  [facility] CM/SW Contact:  Bari Leys, RN Phone Number: 05/21/2024, 10:03 AM   Clinical Narrative:   Met with patient at bedside to review dc therapy and home equipment needs, pt confirmed HEP, has old walker from a relative, pt requesting new RW. [employer] delivered RW to bedside. MOON completed. No TOC needs.     Final next level of care: Home/Self Care     Patient Goals and CMS Choice Patient states their goals for this hospitalization and ongoing recovery are:: return home          Discharge Placement                       Discharge Plan and Services Additional resources added to the After Visit Summary for                                       Social Drivers of Health (SDOH) Interventions SDOH Screenings   Food Insecurity: No Food Insecurity (05/20/2024)  Housing: Low Risk  (05/20/2024)  Transportation Needs: No Transportation Needs (05/20/2024)  Utilities: Not At Risk (05/20/2024)  Alcohol Screen: Medium Risk (06/08/2020)  Depression (PHQ2-9): High Risk (10/12/2023)  Financial Resource Strain: Low Risk  (03/10/2024)   Received from [facility]  Physical Activity: Insufficiently Active (11/14/2023)   Received from [facility]  Social Connections: Socially Isolated (05/20/2024)  Stress: No Stress Concern Present (11/14/2023)   Received from [facility]  Tobacco Use: High Risk (05/20/2024)     Readmission Risk Interventions     No data to display

## 2024-05-21 NOTE — Progress Notes (Signed)
   Subjective: 1 Day Post-Op Procedure(s) (LRB): ARTHROPLASTY, HIP, TOTAL, ANTERIOR APPROACH (Left) Patient reports pain as mild.   Patient seen in rounds with Dr. Bernard Brick. Patient is resting in bed on exam this morning. No acute events overnight. Foley catheter removed. Patient ambulated 80 feet with PT yesterday. We will start therapy today.   Objective: Vital signs in last 24 hours: Temp:  [96.6 F (35.9 C)-98.8 F (37.1 C)] 97.9 F (36.6 C) (06/18 0537) Pulse Rate:  [56-95] 94 (06/18 0752) Resp:  [10-22] 15 (06/18 0537) BP: (108-146)/(72-95) 139/94 (06/18 0752) SpO2:  [93 %-100 %] 93 % (06/18 0752) Weight:  [97.5 kg] 97.5 kg (06/17 0905)  Intake/Output from previous day:  Intake/Output Summary (Last 24 hours) at 05/21/2024 0753 Last data filed at 05/21/2024 0717 Gross per 24 hour  Intake 2218.97 ml  Output 1350 ml  Net 868.97 ml     Intake/Output this shift: Total I/O In: 240 [P.O.:240] Out: -   Labs: Recent Labs    05/21/24 0353  HGB 13.6   Recent Labs    05/21/24 0353  WBC 21.6*  RBC 4.23  HCT 42.0  PLT 190   Recent Labs    05/21/24 0353  NA 135  K 5.2*  CL 102  CO2 24  BUN 23  CREATININE 1.26*  GLUCOSE 158*  CALCIUM 9.0   No results for input(s): LABPT, INR in the last 72 hours.  Exam: General - Patient is Alert and Oriented Extremity - Neurologically intact Sensation intact distally Intact pulses distally Dorsiflexion/Plantar flexion intact Dressing - dressing C/D/I Motor Function - intact, moving foot and toes well on exam.   Past Medical History:  Diagnosis Date   A-fib (HCC)    Arthritis    right hand   Bradycardia    COPD (chronic obstructive pulmonary disease) (HCC)    Dyspnea    Dysrhythmia    Frequent urination    GERD (gastroesophageal reflux disease)    Hypercholesterolemia    Hypertension    Pneumonia    Pre-diabetes    Prostate cancer (HCC)    RADIATION TX AND SCHEDULED FOR SEED IMPLANTS 02-28-12   Sleep apnea     uses a cpap   Stroke (HCC)    2011;ST memory loss, no other deficits.   Urinary urgency     Assessment/Plan: 1 Day Post-Op Procedure(s) (LRB): ARTHROPLASTY, HIP, TOTAL, ANTERIOR APPROACH (Left) Principal Problem:   S/P total left hip arthroplasty  Estimated body mass index is 27.6 kg/m as calculated from the following:   Height as of this encounter: 6' 2 (1.88 m).   Weight as of this encounter: 97.5 kg. Advance diet Up with therapy D/C IV fluids  DVT Prophylaxis - Xarelto  Weight bearing as tolerated.  Hgb stable at 13.6 this AM Cr 1.26 - near baseline  Plan is to go Home after hospital stay. Plan for discharge today after meeting goals with therapy. Follow up in the office in 2 weeks.   Kim Pen, PA-C Orthopedic Surgery [phone] 05/21/2024, 7:53 AM

## 2024-05-21 NOTE — Progress Notes (Signed)
 Physical Therapy Treatment Patient Details Name: Jonathan Greene MRN: [id number] DOB: [birth year] Today's Date: 05/21/2024   History of Present Illness Pt is 83 yo male s/p L anterior THA on 05/20/24.  Pt with hx including but not limited to bronchiectasis, L femur aseptic necrosis, OA, lung CA, OSA, COPD, HLD, GERD, COPD, CAP, CVA, carotid artery dissection    PT Comments  Pt progressing very well, motivated to d/c later today; will see for second session for stair training and HEP   If plan is discharge home, recommend the following: A little help with walking and/or transfers;A little help with bathing/dressing/bathroom;Assistance with cooking/housework;Help with stairs or ramp for entrance   Can travel by private vehicle        Equipment Recommendations  None recommended by PT    Recommendations for Other Services       Precautions / Restrictions Precautions Precautions: Fall Restrictions Weight Bearing Restrictions Per Provider Order: No LLE Weight Bearing Per Provider Order: Weight bearing as tolerated     Mobility  Bed Mobility Overal bed mobility: Needs Assistance Bed Mobility: Supine to Sit     Supine to sit: Contact guard, HOB elevated     General bed mobility comments: incr time    Transfers Overall transfer level: Needs assistance Equipment used: Rolling walker (2 wheels) Transfers: Sit to/from Stand Sit to Stand: Contact guard assist, From elevated surface           General transfer comment: cues for hand placement    Ambulation/Gait Ambulation/Gait assistance: Contact guard assist, Supervision Gait Distance (Feet): 60 Feet Assistive device: Rolling walker (2 wheels) Gait Pattern/deviations: Step-to pattern, Step-through pattern Gait velocity: decreased but functional     General Gait Details: Min cues for RW proximity and posture; tolerated well   Stairs             Wheelchair Mobility     Tilt Bed    Modified Rankin (Stroke  Patients Only)       Balance                                            Communication Communication Communication: No apparent difficulties  Cognition Arousal: Alert Behavior During Therapy: WFL for tasks assessed/performed   PT - Cognitive impairments: No apparent impairments                         Following commands: Intact      Cueing Cueing Techniques: Verbal cues  Exercises Total Joint Exercises Ankle Circles/Pumps: AROM, Both, 10 reps    General Comments        Pertinent Vitals/Pain Pain Assessment Pain Assessment: Faces Faces Pain Scale: Hurts a little bit Pain Location: L hip Pain Descriptors / Indicators: Burning, Sore Pain Intervention(s): Limited activity within patient's tolerance, Monitored during session, Premedicated before session, Repositioned, Ice applied    Home Living                          Prior Function            PT Goals (current goals can now be found in the care plan section) Acute Rehab PT Goals Patient Stated Goal: return home; get stronger PT Goal Formulation: With patient Time For Goal Achievement: 06/03/24 Potential to Achieve Goals: Good Progress towards PT goals: Progressing toward  goals    Frequency    7X/week      PT Plan      Co-evaluation              AM-PAC PT 6 Clicks Mobility   Outcome Measure  Help needed turning from your back to your side while in a flat bed without using bedrails?: A Little Help needed moving from lying on your back to sitting on the side of a flat bed without using bedrails?: A Little Help needed moving to and from a bed to a chair (including a wheelchair)?: A Little Help needed standing up from a chair using your arms (e.g., wheelchair or bedside chair)?: A Little Help needed to walk in hospital room?: A Little Help needed climbing 3-5 steps with a railing? : A Little 6 Click Score: 18    End of Session Equipment Utilized During  Treatment: Gait belt Activity Tolerance: Patient tolerated treatment well Patient left: in chair;with call bell/phone within reach;with family/visitor present Nurse Communication: Mobility status PT Visit Diagnosis: Other abnormalities of gait and mobility (R26.89);Muscle weakness (generalized) (M62.81)     Time: 1610-9604 PT Time Calculation (min) (ACUTE ONLY): 18 min  Charges:    $Gait Training: 8-22 mins PT General Charges $$ ACUTE PT VISIT: 1 Visit                     Shanelle Clontz, PT  Acute Rehab Dept [facility]) [phone]  05/21/2024    [facility] 05/21/2024, 10:57 AM

## 2024-05-21 NOTE — Plan of Care (Signed)

## 2024-05-27 ENCOUNTER — Encounter (HOSPITAL_COMMUNITY): Payer: Self-pay | Admitting: Orthopedic Surgery

## 2024-05-27 DIAGNOSIS — R7303 Prediabetes: Secondary | ICD-10-CM

## 2024-05-27 DIAGNOSIS — M1612 Unilateral primary osteoarthritis, left hip: Secondary | ICD-10-CM

## 2024-05-31 NOTE — Discharge Summary (Signed)
 Patient ID: Jonathan Greene MRN: [id number] DOB/AGE: [birth year] 82 y.o.  Admit date: 05/20/2024 Discharge date: 05/21/2024  Admission Diagnoses:  Left hip osteoarthritis  Discharge Diagnoses:  Principal Problem:   S/P total left hip arthroplasty   Past Medical History:  Diagnosis Date   A-fib [facility])    Arthritis    right hand   Bradycardia    COPD (chronic obstructive pulmonary disease) (HCC)    Dyspnea    Dysrhythmia    Frequent urination    GERD (gastroesophageal reflux disease)    Hypercholesterolemia    Hypertension    Pneumonia    Pre-diabetes    Prostate cancer (HCC)    RADIATION TX AND SCHEDULED FOR SEED IMPLANTS 02-28-12   Sleep apnea    uses a cpap   Stroke (HCC)    2011;ST memory loss, no other deficits.   Urinary urgency     Surgeries: Procedure(s): ARTHROPLASTY, HIP, TOTAL, ANTERIOR APPROACH on 05/20/2024   Consultants:   Discharged Condition: Improved  Hospital Course: Jonathan Greene is an 83 y.o. male who was admitted 05/20/2024 for operative treatment ofS/P total left hip arthroplasty. Patient has severe unremitting pain that affects sleep, daily activities, and work/hobbies. After pre-op clearance the patient was taken to the operating room on 05/20/2024 and underwent  Procedure(s): ARTHROPLASTY, HIP, TOTAL, ANTERIOR APPROACH.    Patient was given perioperative antibiotics:  Anti-infectives (From admission, onward)    Start     Dose/Rate Route Frequency Ordered Stop   05/20/24 1800  ceFAZolin  (ANCEF ) IVPB 2g/100 mL premix        2 g 200 mL/hr over 30 Minutes Intravenous Every 6 hours 05/20/24 1515 05/21/24 0023   05/20/24 0900  ceFAZolin  (ANCEF ) IVPB 2g/100 mL premix        2 g 200 mL/hr over 30 Minutes Intravenous On call to O.R. 05/20/24 0856 05/20/24 1149        Patient was given sequential compression devices, early ambulation, and chemoprophylaxis to prevent DVT. Patient worked with PT and was meeting their goals regarding safe  ambulation and transfers.  Patient benefited maximally from hospital stay and there were no complications.    Recent vital signs: No data found.   Recent laboratory studies: No results for input(s): WBC, HGB, HCT, PLT, NA, K, CL, CO2, BUN, CREATININE, GLUCOSE, INR, CALCIUM in the last 72 hours.  Invalid input(s): PT, 2   Discharge Medications:   Allergies as of 05/21/2024   No Known Allergies      Medication List     TAKE these medications    albuterol  108 (90 Base) MCG/ACT inhaler Commonly known as: VENTOLIN  HFA Inhale 1-2 puffs into the lungs every 4 (four) hours as needed.   albuterol  (2.5 MG/3ML) 0.083% nebulizer solution Commonly known as: PROVENTIL  Take 3 mLs (2.5 mg total) by nebulization every 6 (six) hours as needed for wheezing or shortness of breath.   amLODipine  2.5 MG tablet Commonly known as: NORVASC  Take 2.5 mg by mouth daily.   APPLE CIDER VINEGAR PO Take 450 mg by mouth in the morning and at bedtime.   b complex vitamins capsule Take 1 capsule by mouth in the morning.   Breztri  Aerosphere 160-9-4.8 MCG/ACT Aero inhaler Generic drug: budesonide -glycopyrrolate -formoterol  Inhale 2 puffs into the lungs in the morning and at bedtime.   Breztri  Aerosphere 160-9-4.8 MCG/ACT Aero inhaler Generic drug: budesonide -glycopyrrolate -formoterol  Inhale 2 puffs into the lungs in the morning and at bedtime.   escitalopram  5 MG tablet Commonly known as: LEXAPRO   Take 5 mg by mouth in the morning.   methocarbamol  500 MG tablet Commonly known as: ROBAXIN  Take 1 tablet (500 mg total) by mouth every 6 (six) hours as needed for muscle spasms.   metoprolol  succinate 25 MG 24 hr tablet Commonly known as: Toprol  XL Take 1 tablet (25 mg total) by mouth daily. What changed: when to take this   MILK THISTLE PO Take 1 capsule by mouth in the morning and at bedtime.   omeprazole 20 MG capsule Commonly known as: PRILOSEC Take 20 mg by  mouth daily.   oxyCODONE  5 MG immediate release tablet Commonly known as: Oxy IR/ROXICODONE  Take 1 tablet (5 mg total) by mouth every 4 (four) hours as needed for severe pain (pain score 7-10).   polyethylene glycol 17 g packet Commonly known as: MIRALAX  / GLYCOLAX  Take 17 g by mouth 2 (two) times daily.   rivaroxaban  20 MG Tabs tablet Commonly known as: XARELTO  Take 1 tablet (20 mg total) by mouth daily with supper.   senna 8.6 MG Tabs tablet Commonly known as: SENOKOT Take 2 tablets (17.2 mg total) by mouth at bedtime for 14 days.   simvastatin  40 MG tablet Commonly known as: ZOCOR  Take 40 mg by mouth every other day. At bedtime.   solifenacin 10 MG tablet Commonly known as: VESICARE Take 10 mg by mouth in the morning.   tadalafil  10 MG tablet Commonly known as: CIALIS  Take 1 tablet (10 mg total) by mouth daily as needed.   tamsulosin  0.4 MG Caps capsule Commonly known as: FLOMAX  Take 0.4 mg by mouth at bedtime.               Discharge Care Instructions  (From admission, onward)           Start     Ordered   05/21/24 0000  Change dressing       Comments: Maintain surgical dressing until follow up in the clinic. If the edges start to pull up, may reinforce with tape. If the dressing is no longer working, may remove and cover with gauze and tape, but must keep the area dry and clean.  Call with any questions or concerns.   05/21/24 0756            Diagnostic Studies: DG Pelvis Portable Result Date: 05/20/2024 CLINICAL DATA:  Total left hip arthroplasty. EXAM: PORTABLE PELVIS 1-2 VIEWS COMPARISON:  PET-CT 08/18/2022 FINDINGS: Single frontal view of the bilateral hips. Interval total left hip arthroplasty. No perihardware lucency is seen to indicate hardware failure or loosening. Expected lateral left hip postoperative subcutaneous air. Mild-to-moderate right femoroacetabular joint space narrowing. Brachytherapy seeds overlie the prostate. Moderate  atherosclerotic calcifications. No acute fracture or dislocation. IMPRESSION: Interval total left hip arthroplasty without evidence of hardware failure. Electronically Signed   By: Tanda Lyons M.D.   On: 05/20/2024 14:20   DG HIP UNILAT WITH PELVIS 1V LEFT Result Date: 05/20/2024 CLINICAL DATA:  Elective surgery. Total left hip arthroplasty. Intraoperative fluoroscopy. EXAM: DG HIP (WITH OR WITHOUT PELVIS) 1V*L* COMPARISON:  PET-CT 08/18/2022 FINDINGS: Images were performed intraoperatively without the presence of a radiologist. Severe superior left femoroacetabular joint space narrowing with superior left femoral head mild cortical flattening/remodeling. Brachytherapy seeds overlie the prostate. The patient is undergoing total left hip arthroplasty. No hardware complication is seen. Total fluoroscopy images: 8 Total fluoroscopy time: 6 seconds Total dose: Radiation Exposure Index (as provided by the fluoroscopic device): 1.46 mGy air Kerma Please see intraoperative findings for further  detail. IMPRESSION: Intraoperative fluoroscopy for total left hip arthroplasty. Electronically Signed   By: Tanda Lyons M.D.   On: 05/20/2024 14:19   DG C-Arm 1-60 Min-No Report Result Date: 05/20/2024 Fluoroscopy was utilized by the requesting physician.  No radiographic interpretation.    Disposition: Discharge disposition: 01-Home or Self Care       Discharge Instructions     Call MD / Call 911   Complete by: As directed    If you experience chest pain or shortness of breath, CALL 911 and be transported to the hospital emergency room.  If you develope a fever above 101 F, pus (white drainage) or increased drainage or redness at the wound, or calf pain, call your surgeon's office.   Change dressing   Complete by: As directed    Maintain surgical dressing until follow up in the clinic. If the edges start to pull up, may reinforce with tape. If the dressing is no longer working, may remove and cover with  gauze and tape, but must keep the area dry and clean.  Call with any questions or concerns.   Constipation Prevention   Complete by: As directed    Drink plenty of fluids.  Prune juice may be helpful.  You may use a stool softener, such as Colace (over the counter) 100 mg twice a day.  Use MiraLax  (over the counter) for constipation as needed.   Diet - low sodium heart healthy   Complete by: As directed    Increase activity slowly as tolerated   Complete by: As directed    Weight bearing as tolerated with assist device (walker, cane, etc) as directed, use it as long as suggested by your surgeon or therapist, typically at least 4-6 weeks.   Post-operative opioid taper instructions:   Complete by: As directed    POST-OPERATIVE OPIOID TAPER INSTRUCTIONS: It is important to wean off of your opioid medication as soon as possible. If you do not need pain medication after your surgery it is ok to stop day one. Opioids include: Codeine, Hydrocodone (Norco, Vicodin), Oxycodone (Percocet, oxycontin ) and hydromorphone  amongst others.  Long term and even short term use of opiods can cause: Increased pain response Dependence Constipation Depression Respiratory depression And more.  Withdrawal symptoms can include Flu like symptoms Nausea, vomiting And more Techniques to manage these symptoms Hydrate well Eat regular healthy meals Stay active Use relaxation techniques(deep breathing, meditating, yoga) Do Not substitute Alcohol to help with tapering If you have been on opioids for less than two weeks and do not have pain than it is ok to stop all together.  Plan to wean off of opioids This plan should start within one week post op of your joint replacement. Maintain the same interval or time between taking each dose and first decrease the dose.  Cut the total daily intake of opioids by one tablet each day Next start to increase the time between doses. The last dose that should be eliminated is  the evening dose.      TED hose   Complete by: As directed    Use stockings (TED hose) for 2 weeks on both leg(s).  You may remove them at night for sleeping.        Follow-up Information     Ernie Cough, MD. Schedule an appointment as soon as possible for a visit in 2 week(s).   Specialty: Orthopedic Surgery Contact information: [address] [place] [address] [phone]  Signed: Rosina JONELLE Calin 05/31/2024, 3:39 PM

## 2024-06-02 LAB — MYCOBACTERIA,CULT W/FLUOROCHROME SMEAR
MICRO NUMBER:: 16467709
SMEAR:: NONE SEEN
SPECIMEN QUALITY:: ADEQUATE

## 2024-06-04 ENCOUNTER — Ambulatory Visit: Payer: Self-pay | Admitting: Nurse Practitioner

## 2024-06-04 NOTE — Progress Notes (Signed)
AFB negative

## 2024-07-02 ENCOUNTER — Ambulatory Visit (HOSPITAL_BASED_OUTPATIENT_CLINIC_OR_DEPARTMENT_OTHER): Admitting: Nurse Practitioner

## 2024-07-02 ENCOUNTER — Encounter (HOSPITAL_BASED_OUTPATIENT_CLINIC_OR_DEPARTMENT_OTHER)

## 2024-07-03 ENCOUNTER — Encounter (HOSPITAL_BASED_OUTPATIENT_CLINIC_OR_DEPARTMENT_OTHER): Payer: Self-pay | Admitting: Nurse Practitioner

## 2024-07-03 ENCOUNTER — Ambulatory Visit: Admitting: Internal Medicine

## 2024-07-03 ENCOUNTER — Ambulatory Visit (HOSPITAL_BASED_OUTPATIENT_CLINIC_OR_DEPARTMENT_OTHER): Admitting: Nurse Practitioner

## 2024-07-03 VITALS — BP 136/80 | HR 101 | Ht 74.0 in | Wt 217.0 lb

## 2024-07-03 DIAGNOSIS — J4541 Moderate persistent asthma with (acute) exacerbation: Secondary | ICD-10-CM | POA: Diagnosis not present

## 2024-07-03 DIAGNOSIS — J432 Centrilobular emphysema: Secondary | ICD-10-CM | POA: Diagnosis not present

## 2024-07-03 DIAGNOSIS — J4540 Moderate persistent asthma, uncomplicated: Secondary | ICD-10-CM | POA: Diagnosis not present

## 2024-07-03 DIAGNOSIS — F17210 Nicotine dependence, cigarettes, uncomplicated: Secondary | ICD-10-CM | POA: Diagnosis not present

## 2024-07-03 DIAGNOSIS — Z720 Tobacco use: Secondary | ICD-10-CM

## 2024-07-03 LAB — PULMONARY FUNCTION TEST
DL/VA % pred: 66 %
DL/VA: 2.54 ml/min/mmHg/L
DLCO cor % pred: 56 %
DLCO cor: 15.5 ml/min/mmHg
DLCO unc % pred: 55 %
DLCO unc: 15.05 ml/min/mmHg
FEF 25-75 Post: 3.23 L/s
FEF 25-75 Pre: 2.69 L/s
FEF2575-%Change-Post: 20 %
FEF2575-%Pred-Post: 139 %
FEF2575-%Pred-Pre: 116 %
FEV1-%Change-Post: 2 %
FEV1-%Pred-Post: 100 %
FEV1-%Pred-Pre: 98 %
FEV1-Post: 3.38 L
FEV1-Pre: 3.31 L
FEV1FVC-%Change-Post: 1 %
FEV1FVC-%Pred-Pre: 107 %
FEV6-%Change-Post: 0 %
FEV6-%Pred-Post: 98 %
FEV6-%Pred-Pre: 97 %
FEV6-Post: 4.3 L
FEV6-Pre: 4.27 L
FEV6FVC-%Change-Post: 0 %
FEV6FVC-%Pred-Post: 105 %
FEV6FVC-%Pred-Pre: 105 %
FVC-%Change-Post: 0 %
FVC-%Pred-Post: 92 %
FVC-%Pred-Pre: 92 %
FVC-Post: 4.35 L
FVC-Pre: 4.31 L
Post FEV1/FVC ratio: 78 %
Post FEV6/FVC ratio: 99 %
Pre FEV1/FVC ratio: 77 %
Pre FEV6/FVC Ratio: 99 %
RV % pred: 58 %
RV: 1.69 L
TLC % pred: 78 %
TLC: 6.21 L

## 2024-07-03 MED ORDER — BREZTRI AEROSPHERE 160-9-4.8 MCG/ACT IN AERO: 2.0000 | INHALATION_SPRAY | Freq: Two times a day (BID) | RESPIRATORY_TRACT | 11 refills | Status: AC

## 2024-07-03 MED ORDER — BREZTRI AEROSPHERE 160-9-4.8 MCG/ACT IN AERO: 2.0000 | INHALATION_SPRAY | Freq: Two times a day (BID) | RESPIRATORY_TRACT | Status: DC

## 2024-07-03 NOTE — Progress Notes (Signed)
 Full PFT performed today.

## 2024-07-03 NOTE — Progress Notes (Signed)
 @Patient  ID: Jonathan Greene, male    DOB: 1941-02-19, 83 y.o.   MRN: [id number]  Chief Complaint  Patient presents with   Follow-up    Referring provider: Suanne Pfeiffer, NP  HPI: 83 year old male, active smoker followed for asthmatic bronchitis and lung nodule. He is a patient of Dr. Chari and last seen in office 04/15/2024 by [facility] NP. 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 Past medical history significant for PAF, artery dissection, HTN, hx of stroke, thoracic aortic aneurysm, OSA, GERD, prostate cancer, HLD, depression, prediabetes.   TEST/EVENTS:  06/30/2020 PFT: FEV1 99%, ratio 77, TLC 90%, DLCO 51% 02/05/2024 CT chest: atherosclerosis. Stable thoracic aortic aneurysm. Mildly enlarged 1.3 cm right pretracheal node, slightly decreased. Emphysema. Treated LUL nodule with postradiation changes. Mild to moderate patchy subpleural reticulation with mild tx BTX.  04/15/2024 CXR: hyperinflation. Opacity in LUL, consistent with known mass on CT.  07/03/2024 PFT: FVC 92, FEV1 98, ratio 78, TLC 78, DLCO 56. Mild restriction with moderate diffusion defect   03/06/2024 OV with Dr. Darlean. Former Dr. Shellia pt. Seen by NP in July 2024. Referred back for preoperative eval prior to L THR. Limited mobility due to hip, not DOE. Mucus still green and large volume, mostly worse in AM and since he started back smoking. No formal obstruction on prior PFT.   04/15/2024: Jonathan Greene with Nahzir Pohle NP Discussed the use of AI scribe software for clinical note transcription with the patient, who gave verbal consent to proceed. Jonathan Greene is an 83 year old male who presents with a worsening productive cough over the last three months. He is accompanied by his daughter. He has experienced a worsening productive cough over the past three months, with sputum described as green and sometimes gray. The cough is most prominent in the mornings and when lying down  at night. Sometimes occurs throughout the day. No hemoptysis, fevers, chills, or night sweats have been noted, and his weight has remained stable. He does have shortness of breath with exertion but feels like this is relatively stable. He's not been using his albuterol  rescue inhaler. He is not currently on a maintenance inhaler.  He resumed smoking approximately a year ago. He was recently treated with vancomycin  for a gastrointestinal infection (C. diff) in April. He reports some wheezing but no sinus congestion or drainage. Not taking any OTC medications.  A CT scan performed in March did not reveal any infection, and he recalls having a similar cough at that time.     07/03/2024: Today - follow up Patient presents today for follow up. Cough has improved since his last visit. Still produces thick clear to white phlegm but not noticing much green anymore. Most prominent in the mornings still. He is still smoking. Breathing feels like it is doing okay. No wheezing, hemoptysis, fevers, chills, leg swelling. Not using his rescue. For some reason, [facility] sent him Bevespi  so he hasn't started the Breztri . Does feel like the prednisone  worked to improve his cough last time.  He had PFTs today. Overall stable aside from mild restriction. For some reason, only AFB was collected and this was negative. Sputum culture never collected but CXR clear. IgE was normal.   No Known Allergies  Immunization History  Administered Date(s) Administered   Fluad Quad(high Dose 65+) 09/15/2020, 09/16/2021, 09/01/2022   Influenza, High Dose Seasonal PF 09/06/2018, 08/18/2019, 09/15/2020   Moderna Sars-Covid-2 Vaccination 11/19/2020  PNEUMOCOCCAL CONJUGATE-20 01/18/2022   Pneumococcal Conjugate-13 08/10/2015   Tdap 03/24/2022   Zoster Recombinant(Shingrix) 10/18/2015   Zoster, Live 10/18/2015    Past Medical History:  Diagnosis Date   A-fib (HCC)    Arthritis    right hand   Bradycardia    COPD (chronic  obstructive pulmonary disease) (HCC)    Dyspnea    Dysrhythmia    Frequent urination    GERD (gastroesophageal reflux disease)    Hypercholesterolemia    Hypertension    Pneumonia    Pre-diabetes    Prostate cancer (HCC)    RADIATION TX AND SCHEDULED FOR SEED IMPLANTS 02-28-12   Sleep apnea    uses a cpap   Stroke (HCC)    2011;ST memory loss, no other deficits.   Urinary urgency     Tobacco History: Social History   Tobacco Use  Smoking Status Some Days   Current packs/day: 0.00   Average packs/day: 2.0 packs/day for 35.0 years (70.0 ttl pk-yrs)   Types: Cigarettes   Start date: 02/21/1956   Last attempt to quit: 05/04/2024   Years since quitting: 0.1   Passive exposure: Current  Smokeless Tobacco Current   Types: Chew  Tobacco Comments   CHEW TOBACCO FOR 20 YRS    Ready to quit: Not Answered Counseling given: Not Answered Tobacco comments: CHEW TOBACCO FOR 20 YRS    Outpatient Medications Prior to Visit  Medication Sig Dispense Refill   albuterol  (PROVENTIL ) (2.5 MG/3ML) 0.083% nebulizer solution Take 3 mLs (2.5 mg total) by nebulization every 6 (six) hours as needed for wheezing or shortness of breath. 75 mL 3   albuterol  (VENTOLIN  HFA) 108 (90 Base) MCG/ACT inhaler Inhale 1-2 puffs into the lungs every 4 (four) hours as needed. 18 g 1   amLODipine  (NORVASC ) 2.5 MG tablet Take 2.5 mg by mouth daily.     APPLE CIDER VINEGAR PO Take 450 mg by mouth in the morning and at bedtime.     b complex vitamins capsule Take 1 capsule by mouth in the morning.     budeson-glycopyrrolate -formoterol  (BREZTRI  AEROSPHERE) 160-9-4.8 MCG/ACT AERO inhaler Inhale 2 puffs into the lungs in the morning and at bedtime. 10.7 g 11   budeson-glycopyrrolate -formoterol  (BREZTRI  AEROSPHERE) 160-9-4.8 MCG/ACT AERO inhaler Inhale 2 puffs into the lungs in the morning and at bedtime.     escitalopram  (LEXAPRO ) 5 MG tablet Take 5 mg by mouth in the morning.     metoprolol  succinate (TOPROL  XL) 25 MG  24 hr tablet Take 1 tablet (25 mg total) by mouth daily. (Patient taking differently: Take 25 mg by mouth at bedtime.) 30 tablet 11   MILK THISTLE PO Take 1 capsule by mouth in the morning and at bedtime.     omeprazole (PRILOSEC) 20 MG capsule Take 20 mg by mouth daily.     rivaroxaban  (XARELTO ) 20 MG TABS tablet Take 1 tablet (20 mg total) by mouth daily with supper. 90 tablet 4   simvastatin  (ZOCOR ) 40 MG tablet Take 40 mg by mouth every other day. At bedtime.     solifenacin (VESICARE) 10 MG tablet Take 10 mg by mouth in the morning.     tadalafil  (CIALIS ) 10 MG tablet Take 1 tablet (10 mg total) by mouth daily as needed. 10 tablet 2   tamsulosin  (FLOMAX ) 0.4 MG CAPS capsule Take 0.4 mg by mouth at bedtime.     methocarbamol  (ROBAXIN ) 500 MG tablet Take 1 tablet (500 mg total) by mouth every 6 (six)  hours as needed for muscle spasms. (Patient not taking: Reported on 07/03/2024) 40 tablet 2   oxyCODONE  (OXY IR/ROXICODONE ) 5 MG immediate release tablet Take 1 tablet (5 mg total) by mouth every 4 (four) hours as needed for severe pain (pain score 7-10). (Patient not taking: Reported on 07/03/2024) 42 tablet 0   polyethylene glycol (MIRALAX  / GLYCOLAX ) 17 g packet Take 17 g by mouth 2 (two) times daily. (Patient not taking: Reported on 07/03/2024) 14 each 0   No facility-administered medications prior to visit.     Review of Systems:   Constitutional: No weight loss or gain, night sweats, fevers, chills, fatigue, or lassitude. HEENT: No headaches, difficulty swallowing, tooth/dental problems, or sore throat. No sneezing, itching, ear ache, nasal congestion, or post nasal drip CV:  No chest pain, orthopnea, PND, swelling in lower extremities, anasarca, dizziness, palpitations, syncope Resp: + shortness of breath with exertion; productive cough; No wheeze. No hemoptysis. No chest wall deformity GI:  No heartburn, indigestion MSK:  No joint pain or swelling.   Neuro: No dizziness or  lightheadedness.  Psych: No depression or anxiety. Mood stable.     Physical Exam:  BP 136/80 (BP Location: Left Arm, Patient Position: Sitting, Cuff Size: Large)   Pulse (!) 101   Ht 6' 2 (1.88 m)   Wt 217 lb (98.4 kg)   SpO2 97%   BMI 27.86 kg/m   GEN: Pleasant, interactive, well-kempt; in no acute distress HEENT:  Normocephalic and atraumatic. PERRLA. Sclera white. Nasal turbinates pink, moist and patent bilaterally. No rhinorrhea present. Oropharynx pink and moist, without exudate or edema. No lesions, ulcerations, or postnasal drip.  NECK:  Supple w/ fair ROM. No lymphadenopathy.   CV: Irregular rhythm, rate controlled, no m/r/g, no peripheral edema. Pulses intact, +2 bilaterally. No cyanosis, pallor or clubbing. PULMONARY:  Unlabored, regular breathing. Diminished bibasilar airflow bilaterally A&P w/o wheezes/rales/rhonchi. No accessory muscle use.  GI: BS present and normoactive. Soft, non-tender to palpation. No organomegaly or masses detected.  MSK: No erythema, warmth or tenderness. Cap refil <2 sec all extrem.  Neuro: A/Ox3. No focal deficits noted.   Skin: Warm, no lesions or rashe Psych: Normal affect and behavior. Judgement and thought content appropriate.     Lab Results:  CBC    Component Value Date/Time   WBC 21.6 (H) 05/21/2024 0353   RBC 4.23 05/21/2024 0353   HGB 13.6 05/21/2024 0353   HGB 15.9 04/15/2024 1042   HCT 42.0 05/21/2024 0353   HCT 48.0 04/15/2024 1042   PLT 190 05/21/2024 0353   PLT 198 04/15/2024 1042   MCV 99.3 05/21/2024 0353   MCV 99 (H) 04/15/2024 1042   MCH 32.2 05/21/2024 0353   MCHC 32.4 05/21/2024 0353   RDW 12.9 05/21/2024 0353   RDW 12.3 04/15/2024 1042   LYMPHSABS 1.4 04/15/2024 1042   MONOABS 0.8 03/24/2022 1447   EOSABS 0.2 04/15/2024 1042   BASOSABS 0.0 04/15/2024 1042    BMET    Component Value Date/Time   NA 135 05/21/2024 0353   K 5.2 (H) 05/21/2024 0353   CL 102 05/21/2024 0353   CO2 24 05/21/2024 0353    GLUCOSE 158 (H) 05/21/2024 0353   BUN 23 05/21/2024 0353   CREATININE 1.26 (H) 05/21/2024 0353   CREATININE 1.34 (H) 11/30/2021 1459   CALCIUM 9.0 05/21/2024 0353   GFRNONAA 57 (L) 05/21/2024 0353   GFRAA >60 01/20/2017 1600    BNP No results found for: BNP   Imaging:  No results found.  Administration History     None          Latest Ref Rng & Units 07/03/2024   10:53 AM 06/30/2020   10:08 AM 03/19/2015    9:19 AM  PFT Results  FVC-Pre L 4.31  P 4.45  4.87   FVC-Predicted Pre % 92  P 91  93   FVC-Post L 4.35  P 4.57  4.92   FVC-Predicted Post % 92  P 94  94   Pre FEV1/FVC % % 77  P 76  74   Post FEV1/FCV % % 78  P 77  74   FEV1-Pre L 3.31  P 3.40  3.63   FEV1-Predicted Pre % 98  P 96  94   FEV1-Post L 3.38  P 3.50  3.66   DLCO uncorrected ml/min/mmHg 15.05  P 14.37  20.98   DLCO UNC% % 55  P 51  53   DLCO corrected ml/min/mmHg 15.50  P    DLCO COR %Predicted % 56  P    DLVA Predicted % 66  P 64  52   TLC L 6.21  P 7.09  7.56   TLC % Predicted % 78  P 90  94   RV % Predicted % 58  P 79  94     P Preliminary result    No results found for: NITRICOXIDE      Assessment & Plan:   No problem-specific Assessment & Plan notes found for this encounter. 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 IgE testing normal; AFB negative. Previous prednisone  use improved symptoms, indicating inflammation as a component of the cough. Current inhaler (Bevespi ) is incorrect; Breztri  is preferred due to its inhaled steroid component, which may help reduce inflammation and improve cough management. Rx was sent at prior OV. Will update today and provided with samples. No fever or hemoptysis reported. Lungs sound clear on examination. Smoking contributes to persistent cough. Smoking cessation advised. Action plan in place. Aware of  medication side effect profile.  - Discontinue Bevespi  and start Breztri , two puffs twice a day. - Provide samples of Breztri  to use while waiting for prescription from [facility]. - Use guaifenesin 600 mg as needed for congestion - Encourage use of nebulizer to help clear mucus PRN  Atrial fibrillation Chronic atrial fibrillation consistent with previous findings. No acute changes reported or observed during examination. Follow up with cardiology as scheduled   Tobacco use disorder Continued smoking, currently reduced to five cigarettes per day. Smoking contributes to chronic cough and lung irritation. - Encourage further reduction and cessation of smoking.     Advised if symptoms do not improve or worsen, to please contact office for sooner follow up or seek emergency care.   I spent 35 minutes of dedicated to the care of this patient on the date of this encounter to include pre-visit review of records, face-to-face time with the patient discussing conditions above, post visit ordering of testing, clinical documentation with the electronic health record, making appropriate referrals as documented, and communicating necessary findings to members of the patients care team.  Comer LULLA Rouleau, NP 07/03/2024  Pt aware and understands NP's role.

## 2024-07-03 NOTE — Patient Instructions (Signed)
 Full PFT performed today.

## 2024-07-03 NOTE — Patient Instructions (Addendum)
-  Stop Bevespi . Start Breztri  2 puffs Twice daily. Brush tongue and rinse mouth afterwards -Continue Albuterol  inhaler 2 puffs or 3 mL neb every 6 hours as needed for shortness of breath or wheezing. Notify if symptoms persist despite rescue inhaler/neb use. You can use 1-2 times a day, or more often if having breathing difficulties, but this should help you clear out some of the phlegm -Continue use guaifenesin (Mucinex) 600 mg Twice daily as needed for cough/congestion  All of your testing has been unremarkable so far  We can consider injectable mediations for asthma if your cough continues despite quitting smoking and changing to Breztri     Follow up with oncology as scheduled    Follow up middle of October after CT with new pulmonologist or Katie Thompson Mckim,NP. If symptoms do not improve or worsen, please contact office for sooner follow up or seek emergency care.

## 2024-07-07 ENCOUNTER — Encounter (HOSPITAL_BASED_OUTPATIENT_CLINIC_OR_DEPARTMENT_OTHER): Payer: Self-pay | Admitting: Nurse Practitioner

## 2024-07-07 DIAGNOSIS — Z720 Tobacco use: Secondary | ICD-10-CM | POA: Insufficient documentation

## 2024-07-07 DIAGNOSIS — F17210 Nicotine dependence, cigarettes, uncomplicated: Secondary | ICD-10-CM | POA: Insufficient documentation

## 2024-08-05 ENCOUNTER — Ambulatory Visit: Attending: Nurse Practitioner | Admitting: Nurse Practitioner

## 2024-08-05 ENCOUNTER — Encounter: Payer: Self-pay | Admitting: Nurse Practitioner

## 2024-08-05 VITALS — BP 138/98 | HR 73 | Ht 74.0 in | Wt 225.0 lb

## 2024-08-05 DIAGNOSIS — J449 Chronic obstructive pulmonary disease, unspecified: Secondary | ICD-10-CM

## 2024-08-05 DIAGNOSIS — Z720 Tobacco use: Secondary | ICD-10-CM

## 2024-08-05 DIAGNOSIS — I4891 Unspecified atrial fibrillation: Secondary | ICD-10-CM | POA: Diagnosis not present

## 2024-08-05 DIAGNOSIS — I2510 Atherosclerotic heart disease of native coronary artery without angina pectoris: Secondary | ICD-10-CM

## 2024-08-05 DIAGNOSIS — E785 Hyperlipidemia, unspecified: Secondary | ICD-10-CM

## 2024-08-05 DIAGNOSIS — R911 Solitary pulmonary nodule: Secondary | ICD-10-CM

## 2024-08-05 DIAGNOSIS — I10 Essential (primary) hypertension: Secondary | ICD-10-CM

## 2024-08-05 DIAGNOSIS — I4819 Other persistent atrial fibrillation: Secondary | ICD-10-CM

## 2024-08-05 DIAGNOSIS — I712 Thoracic aortic aneurysm, without rupture, unspecified: Secondary | ICD-10-CM

## 2024-08-05 MED ORDER — AMLODIPINE BESYLATE 10 MG PO TABS: 10.0000 mg | ORAL_TABLET | Freq: Every day | ORAL | 1 refills | Status: AC

## 2024-08-05 NOTE — Patient Instructions (Addendum)
 Medication Instructions:  Please Increase Amlodipine  to 10 Mg daily   Labwork: None   Testing/Procedures: None   Follow-Up: Your physician recommends that you schedule a follow-up appointment in:  2-3 week Blood Pressure check  6 Months   Any Other Special Instructions Will Be Listed Below (If Applicable).  If you need a refill on your cardiac medications before your next appointment, please call your pharmacy.

## 2024-08-05 NOTE — Progress Notes (Unsigned)
 Cardiology Office Note:  .   Date:  08/05/2024 ID:  Jonathan Greene, DOB October 18, 1941, MRN [id number] PCP: Jonathan Harden GAILS, MD  [facility] Providers Cardiologist:  Jonathan Carrier, MD    History of Present Illness: .   Jonathan Greene is a 83 y.o. 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  History of aortic aneurysm followed by pulmonology with regular chest CTs.  Last seen by Dr. Carrier Jonathan on June 20, 2023.  Was overall doing well at the time.  Our office was contacted for preoperative cardiovascular risk assessment.  He is scheduled for left hip total arthroplasty for surgery date on March 25, 2024.  Surgeon will be Dr. Donnice Greene of [facility].  02/07/2024 - He presents today for follow-up.  He states he is doing well from a cardiac standpoint.  He is mainly wheelchair-bound, occasionally walks with a rolling walker due to his left hip.  Daughter states he has chronic DOE related to his COPD, has been stable over time. Currently smokes 1/2 - 1 pack of cigarettes per day. Working on weaning off of nicotine per his report. Does also chew tobacco. Denies any chest pain, palpitations, syncope, presyncope, dizziness, orthopnea, PND, swelling or significant weight changes, acute bleeding, or claudication.  08/05/2024 -  Here for follow-up. Underwent total left hip arthroplasty in June 2025. Doing well since his surgery. Still working with therapy. Admits to labile BP. Denies any acute cardiac complaints or issues. Denies any chest pain, shortness of breath, palpitations, syncope, presyncope, dizziness, orthopnea, PND, swelling or significant weight changes, acute bleeding, or claudication.  ROS: Negative.  See HPI. 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   Studies Reviewed: Jonathan Greene    EKG: EKG Interpretation Date/Time:  Tuesday August 05 2024 11:44:09 EDT Ventricular Rate:  72 PR Interval:    QRS Duration:  96 QT Interval:  400 QTC Calculation: 438 R Axis:   98  Text Interpretation: Atrial fibrillation Rightward axis When compared with ECG of 20-Jun-2023 13:58, No significant change was found Confirmed by Jonathan Greene [phone]) on 08/05/2024 11:46:52 AM    CCTA 02/2021:  IMPRESSION: 1. Mildly dilated ascending thoracic aorta measuring up to 4.2 cm in greatest diameter. Recommend annual imaging followup by CTA or MRA.This recommendation follows 2010 ACCF/AHA/AATS/ACR/ASA/SCA/SCAI/SIR/STS/SVM Guidelines for theDiagnosis and Management of Patients with Thoracic Aortic Disease. Circulation. 2010; 121: Z733-z630. Aortic aneurysm NOS (ICD10-I71.9) 2. Thinning of the myocardium at the left ventricular apex with small apical aneurysm present measuring up to 12 mm in estimated greatest diameter. In retrospect, this had a similar appearance in 2016. 3. Emphysema. 4. Small hiatal hernia.  IMPRESSION: 1. Coronary calcium score of 2505. This was 41 percentile for age and sex matched control.   2. Normal coronary origin with right dominance.   3. CAD-RADS 4. Moderate diffuse heavily calcified plaque in the proximal RCA, proximal and mid LAD and in the proximal LCX arteries. 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 Aggressive risk factor modification is recommended.   4. There is a small a pseudoaneurysm at the inferior portion of the left ventricular apex measuring 12 x 10 mm.   5. Mild ascending aortic aneurysm with maximum diameter 42 mm. Mild diffuse atherosclerotic plaque,  no dissection.   6. 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   IMPRESSION: 1. CT FFR analysis didn't show any significant stenosis in  the LAD and LCX arteries. RCA was not analyzed secondary to significant motion while the patient was in atrial fibrillation with variable heart rate. Aggressive medical management is recommended.   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   Cardiac monitor 08/2020:  14 day event monitor Max HR 190, Min HR 43, Avg HR 87 Atrial firbillation throughout study Rare ventricular ectopy in the form of isolated PVCs, couplets, triplets. Reported symptoms correlated with afib with elevated heart rates.  Echo 06/2020:  1. Left ventricular ejection fraction, by estimation, is 55 to 60%. The  left ventricle has normal function. The left ventricle has no regional  wall motion abnormalities. There is moderate left ventricular hypertrophy.  Left ventricular diastolic  parameters are indeterminate in the setting of atrial fibrillation.   2. Right ventricular systolic function is normal. The right ventricular  size is normal.   3. Left atrial size was mildly dilated.   4. The mitral valve is grossly normal. Trivial mitral valve  regurgitation.   5. The aortic valve is tricuspid. Aortic valve regurgitation is not  visualized. Mild aortic valve sclerosis is present, with no evidence of  aortic valve stenosis.   6. The inferior vena cava is normal in size with greater than 50%  respiratory variability, suggesting right atrial pressure of 3 mmHg.   ETT 06/2017:  Blood pressure demonstrated a normal response to exercise. ECG had significant artifact throughout study. Submaximal stress test as inadequate heart rate achieved. Consider alternative forms of stress testing with imaging if deemed clinically indicated.  Physical Exam:   VS:  BP (!) 138/98   Pulse 73   Ht 6' 2 (1.88 m)   Wt 225 lb (102.1 kg)   SpO2 99%   BMI 28.89 kg/m    Wt Readings from Last 3 Encounters:  08/05/24 225 lb (102.1 kg)  07/03/24 217 lb (98.4 kg)  05/20/24 215 lb (97.5 kg)    GEN: Well  nourished, well developed in no acute distress NECK: No JVD; No carotid bruits CARDIAC: S1/S2, irregularly irregular rhythm, no murmurs, rubs, gallops RESPIRATORY:  Clear and diminished to auscultation without rales, wheezing or rhonchi  ABDOMEN: Soft, non-tender, non-distended EXTREMITIES:  No edema; No deformity   ASSESSMENT AND PLAN: .     1. CAD Noted on prior CT chest. CCTA in 2022 showed 3 vessel CAD, no significant stenosis noted by FFR. Stable with no anginal symptoms. No indication for ischemic evaluation.  Not on aspirin  due to being on Xarelto .  Continue current medication regimen.  Heart healthy diet recommended.  3. HTN SBP labile, DBP elevated today. Discussed to monitor BP at home at least 2 hours after medications and sitting for 5-10 minutes. Will increase Amlodipine  to 10 mg daily. No other medication changes at this time. Heart healthy diet and regular cardiovascular exercise encouraged. Will bring him back in 2-3 weeks for a BP check.   4. HLD LDL 67 in October 2024.  Continue simvastatin .  Heart healthy diet encouraged.  5. A-fib Denies any tachycardia or palpitations.  Heart rate is well-controlled today.  Continue Toprol -XL.  Continue Xarelto  for stroke prevention.  He is on appropriate dosage denies any bleeding issues.  6. Thoracic aortic aneurysm Most recent CT scan of chest 02/2024 showed stable ascending thoracic aortic aneurysm at 4.5 cm.  Was recommended  to continue semiannual imaging follow-up by CTA or MRI.  Will continue to monitor. Care and ED precautions discussed.   7.  Pulmonary nodule Continue follow-up with pulmonology who is managing this.  8. COPD Stable dyspnea on exertion, denies any recent acute worsening symptoms.  Continue current medication regimen.  Continue follow-up with PCP and pulmonology.  9. Tobacco abuse, chewing tobacco  Smoking cessation encouraged and discussed. Also discussed cessation of chewing tobacco.  Dispo: Follow-up  with Dr. Dorn Ross or APP in 6 months or sooner if any changes.  Signed, Almarie Crate, NP

## 2024-08-19 ENCOUNTER — Telehealth: Payer: Self-pay | Admitting: *Deleted

## 2024-08-19 NOTE — Telephone Encounter (Signed)
 CALLED PATIENT TO INFORM OF CT FOR 09-11-24- ARRIVAL TIME- 12:45 PM @ [facility], PATIENT TO BE NPO- 4 HRS. PRIOR TO SCAN, PATIENT TO RECEIVE RESULTS FROM ALISON PERKINS VIA TELEPHONE ON 09-15-24 @ 1 PM, LVM FOR A RETURN CALL

## 2024-08-21 ENCOUNTER — Telehealth: Payer: Self-pay

## 2024-08-21 ENCOUNTER — Ambulatory Visit: Attending: Cardiology

## 2024-08-21 ENCOUNTER — Encounter: Payer: Self-pay | Admitting: Nurse Practitioner

## 2024-08-21 VITALS — BP 132/72 | HR 87

## 2024-08-21 DIAGNOSIS — I10 Essential (primary) hypertension: Secondary | ICD-10-CM | POA: Diagnosis not present

## 2024-08-21 NOTE — Progress Notes (Signed)
 Patient here for BP check. He reported he hasn't been taking his Amlodipine  10 mg in the last 3 days. He developed some swelling in the ankles and he stated the left side was worse. He noticed the change when the medication was increased. He stated that he doesn't elevate them much and they do not hurt. Didn't see much of a change since he stopped taking the amlodipine . He brought the BP log should be scanned in patient's chart. Check BP on both arms Left 128/72 and right 132/72. Her reported no issues of SOB, lightheadedness or dizziness nor chest pain. Advised him will route to provider for review.

## 2024-08-21 NOTE — Telephone Encounter (Signed)
 Left detailed message regarding his nurse visit. Also sent patient MyChart message as well.

## 2024-08-21 NOTE — Telephone Encounter (Signed)
-----   Message from Almarie Crate sent at 08/21/2024  2:17 PM EDT ----- Thanks for seeing him. Okay to remove Norvasc  on med list. BP looks good at this visit. Does have some labile readings, wonder if his monitor is accurate. Continue to monitor and log his BP and if SBP consistently greater than 140 in the next 2-3 weeks, needs to let us  know.   Best, Almarie Crate, NP

## 2024-08-29 ENCOUNTER — Telehealth: Payer: Self-pay

## 2024-08-29 DIAGNOSIS — R609 Edema, unspecified: Secondary | ICD-10-CM

## 2024-08-29 DIAGNOSIS — Z01810 Encounter for preprocedural cardiovascular examination: Secondary | ICD-10-CM

## 2024-08-29 DIAGNOSIS — Z79899 Other long term (current) drug therapy: Secondary | ICD-10-CM

## 2024-08-29 MED ORDER — FUROSEMIDE 20 MG PO TABS: 20.0000 mg | ORAL_TABLET | Freq: Every day | ORAL | 5 refills | Status: DC

## 2024-08-29 NOTE — Telephone Encounter (Signed)
 Per Almarie:  Please begin Lasix  20 mg daily and please obtain BMET, proBNP, and Mag in 1 week. Please bring him back in to see me or APP for sooner office visit for leg swelling evaluation. If worsening or severe symptoms, recommend ED evaluation. Update us  in 1 week regarding his symptoms after being on Lasix .    Thanks!    Best, Almarie Crate, NP   Advised him will send script in and will mail labs. Someone will call to make him an appointment to evaluate symptoms. Patient verbalized understanding.

## 2024-09-01 ENCOUNTER — Ambulatory Visit: Admitting: Cardiology

## 2024-09-03 ENCOUNTER — Ambulatory Visit
Admission: EM | Admit: 2024-09-03 | Discharge: 2024-09-03 | Disposition: A | Discharge status: Home / Self Care | Attending: Nurse Practitioner | Admitting: Nurse Practitioner

## 2024-09-03 ENCOUNTER — Encounter: Payer: Self-pay | Admitting: Emergency Medicine

## 2024-09-03 ENCOUNTER — Other Ambulatory Visit: Payer: Self-pay

## 2024-09-03 ENCOUNTER — Ambulatory Visit (INDEPENDENT_AMBULATORY_CARE_PROVIDER_SITE_OTHER)

## 2024-09-03 DIAGNOSIS — S8001XA Contusion of right knee, initial encounter: Secondary | ICD-10-CM

## 2024-09-03 DIAGNOSIS — S8011XA Contusion of right lower leg, initial encounter: Secondary | ICD-10-CM | POA: Diagnosis not present

## 2024-09-03 DIAGNOSIS — W19XXXA Unspecified fall, initial encounter: Secondary | ICD-10-CM

## 2024-09-03 NOTE — ED Provider Notes (Signed)
 [facility]    CSN: [id number] Arrival date & time: 09/03/24  1200      History   Chief Complaint Chief Complaint  Patient presents with   Fall    HPI Jonathan Greene is a 83 y.o. male.   The history is provided by the patient.   Patient presents for complaints of right knee pain.  Patient states symptoms started after he fell 4 to 5 days prior.  He states that he has pain in the knee, swelling, and bruising down his right leg.  States that he did notice that there was an area that was oozing on the right knee, states that he has placed a Band-Aid to the site.  Patient denies numbness, tingling, radiation of pain, or the inability to bear weight.  He reports that he is currently taking blood thinners for A-fib.  So far, states he has not been using any medication for his symptoms.  Past Medical History:  Diagnosis Date   A-fib [facility])    Arthritis    right hand   Bradycardia    COPD (chronic obstructive pulmonary disease) (HCC)    Dyspnea    Dysrhythmia    Frequent urination    GERD (gastroesophageal reflux disease)    Hypercholesterolemia    Hypertension    Pneumonia    Pre-diabetes    Prostate cancer (HCC)    RADIATION TX AND SCHEDULED FOR SEED IMPLANTS 02-28-12   Sleep apnea    uses a cpap   Stroke (HCC)    2011;ST memory loss, no other deficits.   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   Melanoma of right side of neck (HCC) 01/01/2024   Pain of left hip joint 11/23/2023   Mood disorder 09/04/2023   Lung cancer (HCC) 03/21/2023   Thoracic aortic aneurysm 03/21/2023   OSA (obstructive  sleep apnea) 12/21/2022   COPD (chronic obstructive pulmonary disease) (HCC) 11/02/2022   Lung nodule 09/21/2022   Benign prostatic hyperplasia with nocturia 01/18/2022   Moderate episode of recurrent major depressive disorder (HCC) 01/18/2022   Mixed hyperlipidemia 01/18/2022   Gastroesophageal reflux disease without esophagitis 01/18/2022   Chronic pain in left shoulder 01/18/2022   COPD with acute exacerbation (HCC) 09/30/2021   CAP (community acquired pneumonia) 09/30/2021   Hemoptysis 09/30/2021   Hardening of the aorta (main artery of the heart) 09/16/2021   Prediabetes 09/16/2021   Mass of upper lobe of left lung 06/08/2020   Diarrhea 12/31/2019   Melena 12/31/2019   Essential hypertension 12/11/2016   Bradycardia 12/11/2016   Emphysema of lung (HCC) 12/11/2016   A-fib (HCC) 02/11/2015   Hematochezia 08/04/2014   Stroke (cerebrum) (HCC) 12/13/2013   Aneurysm 12/12/2013   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   Prostate cancer (HCC) 12/07/2011    Past Surgical History:  Procedure Laterality Date   BRONCHIAL BIOPSY  10/24/2022   Procedure: BRONCHIAL BIOPSIES;  Surgeon: Brenna Adine CROME, DO;  Location: [facility];  Service: Pulmonary;;   BRONCHIAL NEEDLE ASPIRATION BIOPSY  10/24/2022   Procedure: BRONCHIAL NEEDLE ASPIRATION BIOPSIES;  Surgeon: Brenna Adine CROME, DO;  Location: [facility];  Service: Pulmonary;;   BRONCHIAL WASHINGS  10/24/2022   Procedure: BRONCHIAL WASHINGS;  Surgeon: Brenna Adine CROME, DO;  Location: [facility];  Service: Pulmonary;;   CATARACT EXTRACTION W/PHACO Right 11/06/2013   Procedure: CATARACT EXTRACTION PHACO AND INTRAOCULAR LENS PLACEMENT (IOC);  Surgeon: Cherene Mania, MD;  Location: [facility];  Service: Ophthalmology;  Laterality: Right;  CDE 11.67   CATARACT EXTRACTION W/PHACO Left 11/25/2015   Procedure: CATARACT EXTRACTION PHACO AND INTRAOCULAR LENS PLACEMENT ; CDE:  8.15;  Surgeon: Cherene Mania, MD;  Location: [facility];  Service: Ophthalmology;   Laterality: Left;   COLONOSCOPY  10/03/2006   MFM:Ipfpwlupcz rectal polyps cold biopsied/removed, otherwise normal rectum/Sigmoid diverticula.  Remainder of colon mucosa appeared normal   COLONOSCOPY N/A 08/19/2014   RMR: Radiation proctitis-status post APC ablation. Colonic diverticulosis.   CYSTOSCOPY  02/28/2012   Procedure: CYSTOSCOPY;  Surgeon: Alm GORMAN Fragmin, MD;  Location: [facility];  Service: Urology;  Laterality: N/A;   no seeds noted in bladder   MELANOMA EXCISION Right    neck   RADIOACTIVE SEED IMPLANT  02/28/2012   Procedure: RADIOACTIVE SEED IMPLANT;  Surgeon: Alm GORMAN Fragmin, MD;  Location: [facility];  Service: Urology;  Laterality: N/A;  67seeds implanted    TOTAL HIP ARTHROPLASTY Left 05/20/2024   Procedure: ARTHROPLASTY, HIP, TOTAL, ANTERIOR APPROACH;  Surgeon: Ernie Cough, MD;  Location: [facility];  Service: Orthopedics;  Laterality: Left;       Home Medications    Prior to Admission medications   Medication Sig Start Date End Date Taking? Authorizing Provider  albuterol  (PROVENTIL ) (2.5 MG/3ML) 0.083% nebulizer solution Take 3 mLs (2.5 mg total) by nebulization every 6 (six) hours as needed for wheezing or shortness of breath. 04/15/24   Cobb, Comer GAILS, NP  albuterol  (VENTOLIN  HFA) 108 (90 Base) MCG/ACT inhaler Inhale 1-2 puffs into the lungs every 4 (four) hours as needed. 03/06/24   Darlean Ozell NOVAK, MD  amLODipine  (NORVASC ) 10 MG tablet Take 1 tablet (10 mg total) by mouth daily. 08/05/24   Miriam Norris, NP  APPLE CIDER VINEGAR PO Take 450 mg by mouth in the morning and at bedtime.    [provider]  b complex vitamins capsule Take 1 capsule by mouth in the morning.    [provider]  B Complex-C (B-COMPLEX WITH VITAMIN C) tablet Take 1 tablet by mouth daily.    [provider]  budesonide -glycopyrrolate -formoterol  (BREZTRI  AEROSPHERE) 160-9-4.8 MCG/ACT AERO inhaler Inhale 2 puffs into the lungs in the  morning and at bedtime. 07/03/24   Cobb, Comer GAILS, NP  budesonide -glycopyrrolate -formoterol  (BREZTRI  AEROSPHERE) 160-9-4.8 MCG/ACT AERO inhaler Inhale 2 puffs into the lungs in the morning and at bedtime. 07/03/24   Cobb, Comer GAILS, NP  escitalopram  (LEXAPRO ) 5 MG tablet Take 5 mg by mouth in the morning.    [provider]  furosemide  (LASIX ) 20 MG tablet Take 1 tablet (20 mg total) by mouth daily. 08/29/24   Miriam Norris, NP  metoprolol  succinate (TOPROL  XL) 25 MG 24 hr tablet Take 1 tablet (25 mg total) by mouth daily. Patient taking differently: Take 25 mg by mouth at bedtime. 07/29/20   Strader, Brittany M, PA-C  MILK THISTLE PO Take 1 capsule by mouth in the morning and at bedtime.    [provider]  omeprazole (PRILOSEC) 20 MG capsule Take 20 mg by mouth daily.    [provider]  rivaroxaban  (XARELTO ) 20 MG TABS tablet Take 1 tablet (20 mg total)  by mouth daily with supper. 03/16/15   Alvan Dorn FALCON, MD  simvastatin  (ZOCOR ) 40 MG tablet Take 40 mg by mouth every other day. At bedtime. 12/15/14   [provider]  solifenacin (VESICARE) 10 MG tablet Take 10 mg by mouth in the morning. 02/05/19   [provider]  tadalafil  (CIALIS ) 10 MG tablet Take 1 tablet (10 mg total) by mouth daily as needed. 02/07/24   Miriam Norris, NP  tamsulosin  (FLOMAX ) 0.4 MG CAPS capsule Take 0.4 mg by mouth at bedtime.    [provider]    Family History Family History  Problem Relation Age of Onset   Stroke Mother    Hypertension Mother    Colon cancer Sister        in her 10s   Breast cancer Sister    COPD Sister    Hypertension Sister    Pneumonia Sister    Heart attack Brother    COPD Son    COPD Maternal Aunt    Cancer Neg Hx    Liver disease Neg Hx     Social History Social History   Tobacco Use   Smoking status: Some Days    Current packs/day: 0.00    Average packs/day: 2.0 packs/day for 35.0 years (70.0 ttl pk-yrs)    Types:  Cigarettes    Start date: 02/21/1956    Last attempt to quit: 05/04/2024    Years since quitting: 0.3    Passive exposure: Current   Smokeless tobacco: Current    Types: Chew   Tobacco comments:    CHEW TOBACCO FOR 20 YRS   Vaping Use   Vaping status: Never Used  Substance Use Topics   Alcohol use: Yes    Alcohol/week: 2.0 - 4.0 standard drinks of alcohol    Types: 2 - 4 Shots of liquor per week    Comment: 4 ounces daily   Drug use: No     Allergies   Patient has no known allergies.   Review of Systems Review of Systems Per HPI  Physical Exam Triage Vital Signs ED Triage Vitals  Encounter Vitals Group     BP 09/03/24 1217 129/87     Girls Systolic BP Percentile --      Girls Diastolic BP Percentile --      Boys Systolic BP Percentile --      Boys Diastolic BP Percentile --      Pulse Rate 09/03/24 1217 78     Resp 09/03/24 1217 20     Temp 09/03/24 1217 97.8 F (36.6 C)     Temp Source 09/03/24 1217 Oral     SpO2 09/03/24 1217 98 %     Weight --      Height --      Head Circumference --      Peak Flow --      Pain Score 09/03/24 1215 6     Pain Loc --      Pain Education --      Exclude from Growth Chart --    No data found.  Updated Vital Signs BP 129/87 (BP Location: Right Arm)   Pulse 78   Temp 97.8 F (36.6 C) (Oral)   Resp 20   SpO2 98%   Visual Acuity Right Eye Distance:   Left Eye Distance:   Bilateral Distance:    Right Eye Near:   Left Eye Near:    Bilateral Near:     Physical Exam Vitals and  nursing note reviewed.  Constitutional:      General: He is not in acute distress.    Appearance: Normal appearance.  HENT:     Head: Normocephalic.  Eyes:     Extraocular Movements: Extraocular movements intact.     Pupils: Pupils are equal, round, and reactive to light.  Cardiovascular:     Rate and Rhythm: Normal rate and regular rhythm.     Pulses: Normal pulses.     Heart sounds: Normal heart sounds.  Pulmonary:     Effort:  Pulmonary effort is normal.     Breath sounds: Normal breath sounds.  Musculoskeletal:     Cervical back: Normal range of motion.     Right upper leg: Swelling present. No deformity or tenderness.     Right knee: Swelling, erythema and ecchymosis present. Tenderness present. Normal pulse.     Right lower leg: Swelling present. No deformity or tenderness. No edema.  Skin:    General: Skin is warm and dry.  Neurological:     General: No focal deficit present.     Mental Status: He is alert and oriented to person, place, and time.  Psychiatric:        Mood and Affect: Mood normal.        Behavior: Behavior normal.                      UC Treatments / Results  Labs (all labs ordered are listed, but only abnormal results are displayed) Labs Reviewed - No data to display  EKG   Radiology DG Knee Complete 4 Views Right Result Date: 09/03/2024 CLINICAL DATA:  Right knee pain after fall several days ago. EXAM: RIGHT KNEE - COMPLETE 4+ VIEW COMPARISON:  None Available. FINDINGS: No evidence of fracture, dislocation, or joint effusion. Vascular calcifications are noted. Mild narrowing of medial joint space is noted. Probable moderate size contusion involving prepatellar soft tissues. IMPRESSION: Probable moderate size contusion involving prepatellar soft tissues. No fracture or dislocation is noted. Electronically Signed   By: Lynwood Landy Raddle M.D.   On: 09/03/2024 12:49    Procedures Procedures (including critical care time)  Medications Ordered in UC Medications - No data to display  Initial Impression / Assessment and Plan / UC Course  I have reviewed the triage vital signs and the nursing notes.  Pertinent labs & imaging results that were available during my care of the patient were reviewed by me and considered in my medical decision making (see chart for details).  Patient presents for complaints of right knee pain and swelling has been present for the past  several days.  Patient states that he fell on the knee prior to his symptoms starting.  He does have moderate bruising and swelling of the knee.  X-rays negative for fracture or dislocation.  Symptoms are consistent with x-ray findings consisting of a moderate-sized contusion involving the prepatellar soft tissues.  Ace wrap was placed to allow for compression and support.  Supportive care recommendations were provided and discussed with the patient to include RICE therapy, and over-the-counter analgesics.  Patient was given indications regarding follow-up.  Patient is in agreement with this plan of care and verbalizes understanding.  All questions were answered.  Patient stable for discharge.   Final Clinical Impressions(s) / UC Diagnoses   Final diagnoses:  None   Discharge Instructions   None    ED Prescriptions   None    PDMP not reviewed this encounter.  Gilmer Etta PARAS, NP 09/03/24 1339

## 2024-09-03 NOTE — Discharge Instructions (Signed)
 The x-ray was negative for fracture or dislocation.  The x-ray does show that you do have a moderate size contusion to your right knee.  The additional bruising to your right leg is most likely caused by the blood thinners you are currently taking. An Ace wrap has been provided to allow for compression.  This will also help decrease the swelling.  The bruising may take several weeks for complete healing. You may take over-the-counter Tylenol  as needed for pain or discomfort. RICE therapy, rest, ice, compression, and elevation.  Elevate the right leg above the level of the heart is much as possible to help decrease bruising and swelling.  Wear the Ace wrap until the swelling/contusion of the right knee improves.  Apply ice to help with pain or swelling.  Apply for 20 minutes, remove for 1 hour, repeat is much as possible while symptoms persist. If you develop shortness of breath, difficulty breathing, or increased swelling or bruising in your right leg, please go to the emergency department immediately for further evaluation. Follow-up with your primary care physician within the next 10 to 14 days if symptoms fail to improve. Follow-up as needed.

## 2024-09-03 NOTE — ED Triage Notes (Addendum)
 Pt reports tripped and fell on Friday and has right knee pain ever since. Pt reports is taking xarelto . Denies hitting head or loc.   States I was moving something and I fell over it. Pt noted to have large contusion to right thigh, moderate sweling to right knee, and red/purplish discoloration to lower right shin and calf. Pt states pain is only in right knee.

## 2024-09-06 LAB — BASIC METABOLIC PANEL WITH GFR
BUN/Creatinine Ratio: 6 — ABNORMAL LOW (ref 10–24)
BUN: 9 mg/dL (ref 8–27)
CO2: 26 mmol/L (ref 20–29)
Calcium: 8.8 mg/dL (ref 8.6–10.2)
Chloride: 98 mmol/L (ref 96–106)
Creatinine, Ser: 1.51 mg/dL — ABNORMAL HIGH (ref 0.76–1.27)
Glucose: 154 mg/dL — ABNORMAL HIGH (ref 70–99)
Potassium: 4.4 mmol/L (ref 3.5–5.2)
Sodium: 139 mmol/L (ref 134–144)
eGFR: 46 mL/min/1.73 — ABNORMAL LOW (ref >59)

## 2024-09-06 LAB — MAGNESIUM: Magnesium: 1.2 mg/dL — ABNORMAL LOW (ref 1.6–2.3)

## 2024-09-06 LAB — BRAIN NATRIURETIC PEPTIDE: BNP: 127.2 pg/mL — ABNORMAL HIGH (ref 0.0–100.0)

## 2024-09-08 ENCOUNTER — Ambulatory Visit: Payer: Self-pay | Admitting: Nurse Practitioner

## 2024-09-08 DIAGNOSIS — Z79899 Other long term (current) drug therapy: Secondary | ICD-10-CM

## 2024-09-09 NOTE — Telephone Encounter (Signed)
-----   Message from Almarie Crate sent at 09/08/2024  4:29 PM EDT ----- Kidney function is more elevated recently.  I recommend holding Lasix  for the next 3 days, then resume and repeat BMET in 5 to 7 days.  I would encourage him to stay well-hydrated.  Almarie Crate, AGNP-C ----- Message ----- From: Interface, [facility] Lab Results In Sent: 09/05/2024   7:39 AM EDT To: Almarie Crate, NP

## 2024-09-11 ENCOUNTER — Ambulatory Visit (HOSPITAL_COMMUNITY)
Admission: RE | Admit: 2024-09-11 | Discharge: 2024-09-11 | Disposition: A | Discharge status: Home / Self Care | Source: Ambulatory Visit | Attending: Radiation Oncology | Admitting: Radiation Oncology

## 2024-09-11 ENCOUNTER — Telehealth: Payer: Self-pay

## 2024-09-11 DIAGNOSIS — C3412 Malignant neoplasm of upper lobe, left bronchus or lung: Secondary | ICD-10-CM | POA: Insufficient documentation

## 2024-09-11 DIAGNOSIS — Z79899 Other long term (current) drug therapy: Secondary | ICD-10-CM

## 2024-09-11 MED ORDER — IOHEXOL 300 MG/ML  SOLN
75.0000 mL | Freq: Once | INTRAMUSCULAR | Status: AC | PRN
  Administered 2024-09-11: 75 mL via INTRAVENOUS

## 2024-09-11 MED ORDER — MAG-OXIDE 200 MG PO TABS: 200.0000 mg | ORAL_TABLET | Freq: Two times a day (BID) | ORAL | 3 refills | Status: AC

## 2024-09-11 NOTE — Telephone Encounter (Signed)
-----   Message from Almarie Crate sent at 09/08/2024  4:30 PM EDT ----- Magnesium is low.  Please also begin magnesium oxide 200 mg twice daily and obtain magnesium along with BMET in 5 to 7 days.  Thanks!  Best Almarie Crate, NP

## 2024-09-11 NOTE — Telephone Encounter (Signed)
 The patient has been notified of the result and verbalized understanding.  All questions (if any) were answered. Rosina JAYSON Cornea, CMA 09/11/2024 4:30 PM

## 2024-09-12 NOTE — Progress Notes (Signed)
 CLINICAL DATA:  Non-small cell lung cancer restaging, prior SBRT   Patient complaints?  Patient is having some pain in his right knee/ankle from a fall 2 weeks ago (rates a 3). He tripped over something outside and fell. He went to Urgent Care for x-rays and they were negative. No chest pain or SOB currently- he did have some SOB after treatment but feels it is progressively getting better.   Follow up call to patient to receive Chest CT results from 09/11/24 IMPRESSION: 1. Consolidation in the left upper lobe compatible with prior radiation therapy, slightly smaller in volume but more dense than on the prior exam, obscuring the underlying original lung nodule. 2. Stable mildly enlarged lower paratracheal lymph node. 3. 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 This could be surveilled in the context of the patient's follow up oncology imaging. Alternatively, recommend annual imaging followup by CTA or MRA. This recommendation follows 2010 ACCF/AHA/AATS/ACR/ASA/SCA/SCAI/SIR/STS/SVM Guidelines for the Diagnosis and Management of Patients with Thoracic Aortic Disease. Circulation. 2010; 121: Z733-z630. Aortic aneurysm NOS (ICD10-I71.9) 4. 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 5. 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

## 2024-09-15 ENCOUNTER — Ambulatory Visit
Admission: RE | Admit: 2024-09-15 | Discharge: 2024-09-15 | Disposition: A | Discharge status: Home / Self Care | Source: Ambulatory Visit | Attending: Radiation Oncology | Admitting: Radiation Oncology

## 2024-09-15 ENCOUNTER — Telehealth: Payer: Self-pay | Admitting: *Deleted

## 2024-09-15 DIAGNOSIS — C3412 Malignant neoplasm of upper lobe, left bronchus or lung: Secondary | ICD-10-CM

## 2024-09-15 NOTE — Progress Notes (Signed)
 Radiation Oncology         [phone] ________________________________  Outpatient Follow Up - Conducted via telephone at patient request.  I spoke with the patient to conduct this visit via telephone. The patient was notified in advance and was offered an in person or telemedicine meeting to allow for face to face communication but instead preferred to proceed with a telephone visit.  Name: Jonathan Greene        MRN: [id number]  Date of Service: 09/15/2024 DOB: Mar 24, 1941  [name], Jonathan GAILS, MD  Suanne Pfeiffer, NP     REFERRING PHYSICIAN: Suanne Pfeiffer, NP   DIAGNOSIS: There were no encounter diagnoses.   HISTORY OF PRESENT ILLNESS: Jonathan Greene is a 83 y.o. male with a Putative Stage IA3, cT1cN0M0, NSCLC of the LUL. He was found to have a mass in the LUL by CT in August 2023.  PET on 08/18/22 showed a hypermetabolic enlarging subpleural nodule peripherally in the left upper lobe and a previously treated lesion in the posterior left upper lobe without recurrent metabolic activity. No evidence of metastatic disease was seen. Patient was reviewed at the multidisciplinary oncology conference and was scheduled for navigational bronchoscopy with Dr. Brenna on 10/24/22. Cytology was nondiagnostic and cultures were negative. Unfortunately during navigational bronchoscopy several samples were attempted however patient had associated hemorrhage around the surrounding parenchyma of the lesion. Due to patient being high risk for another biopsy a follow-up CT was recommended to reassess nodule. Chest CT on 12/14/22 showed left upper lobe nodule increasing in size along with a stable small solid pulmonary nodule in the right upper lobe. Stable posttreatment changes of the posterior left upper lobe were also visualized. Of note, a dilated ascending thoracic aorta was visualized measuring 4.4 cm, and stable to prior scans.  He was counseled on the options for empiric treatment with Stereotactic  body radiotherapy (SBRT)  for what was believed to be a malignant nodule. He completed radiation on 01/19/23. His post treatment scans have been reassuring, and post treatment changes have been noted, in 2024 there was a note of a right paratracheal node measuring 1.4 cm.  His scan in March 2025 showed a slight decrease in the pretracheal node and a repeat on 09/11/2024 with IV contrast shows persistent of the low paratracheal lymph node in the carinal measuring 1.5 cm, and ascending thoracic aortic aneurysm measuring 4.2 cm, and consolidation in the left upper lobe compatible with prior radiation therapy smaller in volume but dense with this postradiation change obscuring the original nodule.  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  He is contacted by phone to review these results.   PREVIOUS RADIATION THERAPY: Yes   01/09/2023 through 01/19/2023 SBRT Treatment Site Technique Total Dose (Gy) Dose per Fx (Gy) Completed Fx Beam Energies  Lung, Left:  LUL Target IMRT 60/60 12 5/5 6XFFF    EBRT and brachytherapy to the prostate in 2013 with Dr. Jason.    PAST MEDICAL HISTORY:  Past Medical History:  Diagnosis Date   A-fib [facility])    Arthritis    right hand   Bradycardia    COPD (chronic obstructive pulmonary disease) (HCC)    Dyspnea    Dysrhythmia    Frequent urination    GERD (gastroesophageal reflux disease)    Hypercholesterolemia    Hypertension    Pneumonia    Pre-diabetes    Prostate cancer (HCC)    RADIATION  TX AND SCHEDULED FOR SEED IMPLANTS 02-28-12   Sleep apnea    uses a cpap   Stroke (HCC)    2011;ST memory loss, no other deficits.   Urinary urgency        PAST SURGICAL HISTORY: Past Surgical History:  Procedure Laterality Date   BRONCHIAL BIOPSY  10/24/2022   Procedure: BRONCHIAL BIOPSIES;  Surgeon: Brenna Adine CROME, DO;  Location: [facility];  Service: Pulmonary;;   BRONCHIAL  NEEDLE ASPIRATION BIOPSY  10/24/2022   Procedure: BRONCHIAL NEEDLE ASPIRATION BIOPSIES;  Surgeon: Brenna Adine CROME, DO;  Location: [facility];  Service: Pulmonary;;   BRONCHIAL WASHINGS  10/24/2022   Procedure: BRONCHIAL WASHINGS;  Surgeon: Brenna Adine CROME, DO;  Location: [facility];  Service: Pulmonary;;   CATARACT EXTRACTION W/PHACO Right 11/06/2013   Procedure: CATARACT EXTRACTION PHACO AND INTRAOCULAR LENS PLACEMENT (IOC);  Surgeon: Cherene Mania, MD;  Location: [facility];  Service: Ophthalmology;  Laterality: Right;  CDE 11.67   CATARACT EXTRACTION W/PHACO Left 11/25/2015   Procedure: CATARACT EXTRACTION PHACO AND INTRAOCULAR LENS PLACEMENT ; CDE:  8.15;  Surgeon: Cherene Mania, MD;  Location: [facility];  Service: Ophthalmology;  Laterality: Left;   COLONOSCOPY  10/03/2006   MFM:Ipfpwlupcz rectal polyps cold biopsied/removed, otherwise normal rectum/Sigmoid diverticula.  Remainder of colon mucosa appeared normal   COLONOSCOPY N/A 08/19/2014   RMR: Radiation proctitis-status post APC ablation. Colonic diverticulosis.   CYSTOSCOPY  02/28/2012   Procedure: CYSTOSCOPY;  Surgeon: Alm GORMAN Fragmin, MD;  Location: [facility];  Service: Urology;  Laterality: N/A;   no seeds noted in bladder   MELANOMA EXCISION Right    neck   RADIOACTIVE SEED IMPLANT  02/28/2012   Procedure: RADIOACTIVE SEED IMPLANT;  Surgeon: Alm GORMAN Fragmin, MD;  Location: [facility];  Service: Urology;  Laterality: N/A;  67seeds implanted    TOTAL HIP ARTHROPLASTY Left 05/20/2024   Procedure: ARTHROPLASTY, HIP, TOTAL, ANTERIOR APPROACH;  Surgeon: Ernie Cough, MD;  Location: [facility];  Service: Orthopedics;  Laterality: Left;     FAMILY HISTORY:  Family History  Problem Relation Age of Onset   Stroke Mother    Hypertension Mother    Colon cancer Sister        in her 16s   Breast cancer Sister    COPD Sister    Hypertension Sister    Pneumonia Sister    Heart attack Brother    COPD Son    COPD  Maternal Aunt    Cancer Neg Hx    Liver disease Neg Hx      SOCIAL HISTORY:  reports that he has been smoking cigarettes. He started smoking about 68 years ago. He has a 70 pack-year smoking history. He has been exposed to tobacco smoke. His smokeless tobacco use includes chew. He reports current alcohol use of about 2.0 - 4.0 standard drinks of alcohol per week. He reports that he does not use drugs. The patient is widowed and lives in [place]. He lives near his daughter and enjoys being active as much as he can.    ALLERGIES: Patient has no known allergies.   MEDICATIONS:  Current Outpatient Medications  Medication Sig Dispense Refill   albuterol  (PROVENTIL ) (2.5 MG/3ML) 0.083% nebulizer solution Take 3 mLs (2.5 mg total) by nebulization every 6 (six) hours as needed for wheezing or shortness of breath. 75 mL 3   albuterol  (VENTOLIN  HFA) 108 (90 Base) MCG/ACT inhaler Inhale 1-2 puffs into the lungs every 4 (four) hours as needed.  18 g 1   amLODipine  (NORVASC ) 10 MG tablet Take 1 tablet (10 mg total) by mouth daily. 90 tablet 1   APPLE CIDER VINEGAR PO Take 450 mg by mouth in the morning and at bedtime.     b complex vitamins capsule Take 1 capsule by mouth in the morning.     B Complex-C (B-COMPLEX WITH VITAMIN C) tablet Take 1 tablet by mouth daily.     budesonide -glycopyrrolate -formoterol  (BREZTRI  AEROSPHERE) 160-9-4.8 MCG/ACT AERO inhaler Inhale 2 puffs into the lungs in the morning and at bedtime. 10.7 g 11   budesonide -glycopyrrolate -formoterol  (BREZTRI  AEROSPHERE) 160-9-4.8 MCG/ACT AERO inhaler Inhale 2 puffs into the lungs in the morning and at bedtime.     escitalopram  (LEXAPRO ) 5 MG tablet Take 5 mg by mouth in the morning.     furosemide  (LASIX ) 20 MG tablet Take 1 tablet (20 mg total) by mouth daily. 30 tablet 5   Magnesium Oxide -Mg Supplement (MAG-OXIDE) 200 MG TABS Take 1 tablet (200 mg total) by mouth in the morning and at bedtime. 180 tablet 3   metoprolol  succinate  (TOPROL  XL) 25 MG 24 hr tablet Take 1 tablet (25 mg total) by mouth daily. (Patient taking differently: Take 25 mg by mouth at bedtime.) 30 tablet 11   MILK THISTLE PO Take 1 capsule by mouth in the morning and at bedtime.     omeprazole (PRILOSEC) 20 MG capsule Take 20 mg by mouth daily.     rivaroxaban  (XARELTO ) 20 MG TABS tablet Take 1 tablet (20 mg total) by mouth daily with supper. 90 tablet 4   simvastatin  (ZOCOR ) 40 MG tablet Take 40 mg by mouth every other day. At bedtime.     solifenacin (VESICARE) 10 MG tablet Take 10 mg by mouth in the morning.     tadalafil  (CIALIS ) 10 MG tablet Take 1 tablet (10 mg total) by mouth daily as needed. 10 tablet 2   tamsulosin  (FLOMAX ) 0.4 MG CAPS capsule Take 0.4 mg by mouth at bedtime.     No current facility-administered medications for this encounter.     REVIEW OF SYSTEMS: On review of systems, the patient reports that he is doing okay overall. He is using his newer inhaler from pulmonary Breztri  with stability in his breathing. He only notes shortness of breath when he is exerting himself. He denies any cough or hemoptysis. He's not had any unintended weight loss, fevers, or chills. A few weeks ago he did have what he thinks was a viral upper respiratory illness that resolved within a few days after taking OTC decongestants. No other complaints are verbalized.      PHYSICAL EXAM:  Unable to assess due to encounter type.    ECOG = 1  0 - Asymptomatic (Fully active, able to carry on all predisease activities without restriction)  1 - Symptomatic but completely ambulatory (Restricted in physically strenuous activity but ambulatory and able to carry out work of a light or sedentary nature. For example, light housework, office work)  2 - Symptomatic, <50% in bed during the day (Ambulatory and capable of all self care but unable to carry out any work activities. Up and about more than 50% of waking hours)  3 - Symptomatic, >50% in bed, but not  bedbound (Capable of only limited self-care, confined to bed or chair 50% or more of waking hours)  4 - Bedbound (Completely disabled. Cannot carry on any self-care. Totally confined to bed or chair)  5 - Death  Oken MM, Creech RH, Tormey DC, et al. [phone]). Toxicity and response criteria of the [facility]. Am. DOROTHA Bridges. Oncol. 5 (6): 649-55    LABORATORY DATA:  Lab Results  Component Value Date   WBC 21.6 (H) 05/21/2024   HGB 13.6 05/21/2024   HCT 42.0 05/21/2024   MCV 99.3 05/21/2024   PLT 190 05/21/2024   Lab Results  Component Value Date   NA 139 09/04/2024   K 4.4 09/04/2024   CL 98 09/04/2024   CO2 26 09/04/2024   Lab Results  Component Value Date   ALT 62 (H) 03/08/2022   AST 76 (H) 03/08/2022   ALKPHOS 67 07/18/2021   BILITOT 1.2 03/08/2022      RADIOGRAPHY: CT CHEST W CONTRAST Result Date: 09/12/2024 CLINICAL DATA:  Non-small cell lung cancer restaging, prior SBRT * Tracking Code: BO * EXAM: CT CHEST WITH CONTRAST TECHNIQUE: Multidetector CT imaging of the chest was performed during intravenous contrast administration. RADIATION DOSE REDUCTION: This exam was performed according to the departmental dose-optimization program which includes automated exposure control, adjustment of the mA and/or kV according to patient size and/or use of iterative reconstruction technique. CONTRAST:  75mL OMNIPAQUE  IOHEXOL  300 MG/ML  SOLN COMPARISON:  02/05/2024 FINDINGS: Cardiovascular: Coronary, aortic arch, and branch vessel atherosclerotic vascular disease. Mild cardiomegaly. Ascending thoracic aortic aneurysm 4.2 cm in diameter. 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 Mediastinum/Nodes: The lower paratracheal node anterior to the carina measures 1.5 cm in short axis on image 66 series 2, formerly the same by my measurements. Lungs/Pleura: Emphysema. Chronic peripheral interstitial accentuation in the lungs. Consolidation in the left  upper lobe compatible with prior radiation therapy noted, slightly smaller in volume but more dense than on the prior exam, obscuring the underlying original lung nodule. Upper Abdomen: Stable right renal scarring. Abdominal aortic atherosclerosis with notable atheromatous plaque proximally in the celiac trunk and SMA without overt occlusion. Musculoskeletal: Mildly reduced size of the sebaceous cyst medially in the left upper breast region. IMPRESSION: 1. Consolidation in the left upper lobe compatible with prior radiation therapy, slightly smaller in volume but more dense than on the prior exam, obscuring the underlying original lung nodule. 2. Stable mildly enlarged lower paratracheal lymph node. 3. 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 This could be surveilled in the context of the patient's follow up oncology imaging. Alternatively, recommend annual imaging followup by CTA or MRA. This recommendation follows 2010 ACCF/AHA/AATS/ACR/ASA/SCA/SCAI/SIR/STS/SVM Guidelines for the Diagnosis and Management of Patients with Thoracic Aortic Disease. Circulation. 2010; 121: Z733-z630. Aortic aneurysm NOS (ICD10-I71.9) 4. 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 5. 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 Electronically Signed   By: Ryan Salvage M.D.   On: 09/12/2024 09:34   DG Knee Complete 4 Views Right Result Date: 09/03/2024 CLINICAL DATA:  Right knee pain after fall several days ago. EXAM: RIGHT KNEE - COMPLETE 4+ VIEW COMPARISON:  None Available. FINDINGS: No evidence of fracture, dislocation, or joint effusion. Vascular calcifications are noted. Mild narrowing of medial joint space is noted. Probable moderate size contusion involving prepatellar soft tissues. IMPRESSION: Probable moderate size contusion involving prepatellar soft tissues. No fracture or dislocation is noted. Electronically Signed   By: Lynwood Landy Raddle M.D.   On:  09/03/2024 12:49       IMPRESSION/PLAN: 1. Putative Stage IA3, cT1cN0M0, NSCLC of the LUL. The patient is doing well clinically, and we discussed the reassuring findings region of his  LUL treatment. The paratracheal node has been waxing and waning since March of this year. We discussed a shorter interval scan, perhaps 4 months versus considering a PET scan. He is interested in repeating a CT scan and his daughter is also in agreement. If the node were to enlarge at that time we can proceed with PET and further work up but hopefully this will stabilize or improve in time.  2. COPD and sleep apnea. The patient will continue to follow up with pulmonary medicine and we appreciate their input for his care. He does not have a follow up scheduled, so I'll ask our team to reach out to get a follow up for him.  3. Prostate Cancer. The patient is followed in surveillance with Dr. Matilda and we will follow this expectantly.  4. Aortic aneurysm and left ventricular pseudoaneurysm.  Cardiology is aware of this finding, we appreciate their input and he will continue to be followed for medical management.   This encounter was conducted via telephone.  The patient has provided two factor identification and has given verbal consent for this type of encounter and has been advised to only accept a meeting of this type in a secure network environment. The time spent during this encounter was 35 minutes including preparation, discussion, and coordination of the patient's care. The attendants for this meeting include  Jonathan Greene  and Jonathan Greene and his daughter Jonathan Greene. During the encounter,  Jonathan Greene was located at [facility] in the Radiation Oncology Department. Jonathan Greene was located at home, and his daughter Jonathan Greene was also located at home.     **Disclaimer: This note was dictated with voice recognition software. Similar sounding words can inadvertently  be transcribed and this note may contain transcription errors which may not have been corrected upon publication of note.**

## 2024-09-15 NOTE — Telephone Encounter (Signed)
 Called patient to inform of appt. with Madelin Calin on 09-18-24- arrival time- 1:15 pm , patient to report to [address] for this appt., lvm for a return call

## 2024-09-16 ENCOUNTER — Ambulatory Visit (HOSPITAL_COMMUNITY)
Admission: RE | Admit: 2024-09-16 | Discharge: 2024-09-16 | Disposition: A | Discharge status: Home / Self Care | Source: Ambulatory Visit | Attending: Vascular Surgery | Admitting: Vascular Surgery

## 2024-09-16 ENCOUNTER — Other Ambulatory Visit (HOSPITAL_COMMUNITY): Payer: Self-pay | Admitting: Family Medicine

## 2024-09-16 DIAGNOSIS — M79661 Pain in right lower leg: Secondary | ICD-10-CM

## 2024-09-16 DIAGNOSIS — M7989 Other specified soft tissue disorders: Secondary | ICD-10-CM | POA: Diagnosis not present

## 2024-09-18 ENCOUNTER — Ambulatory Visit: Admitting: Adult Health

## 2024-10-09 ENCOUNTER — Other Ambulatory Visit: Payer: Self-pay

## 2024-10-09 ENCOUNTER — Encounter (HOSPITAL_COMMUNITY): Payer: Self-pay | Admitting: *Deleted

## 2024-10-09 ENCOUNTER — Emergency Department (HOSPITAL_COMMUNITY)

## 2024-10-09 ENCOUNTER — Emergency Department (HOSPITAL_COMMUNITY)
Admission: EM | Admit: 2024-10-09 | Discharge: 2024-10-09 | Disposition: A | Discharge status: Home / Self Care | Attending: Emergency Medicine | Admitting: Emergency Medicine

## 2024-10-09 DIAGNOSIS — Z7901 Long term (current) use of anticoagulants: Secondary | ICD-10-CM | POA: Insufficient documentation

## 2024-10-09 DIAGNOSIS — R918 Other nonspecific abnormal finding of lung field: Secondary | ICD-10-CM | POA: Diagnosis not present

## 2024-10-09 DIAGNOSIS — R059 Cough, unspecified: Secondary | ICD-10-CM | POA: Insufficient documentation

## 2024-10-09 DIAGNOSIS — D72829 Elevated white blood cell count, unspecified: Secondary | ICD-10-CM | POA: Diagnosis not present

## 2024-10-09 DIAGNOSIS — F17210 Nicotine dependence, cigarettes, uncomplicated: Secondary | ICD-10-CM | POA: Insufficient documentation

## 2024-10-09 DIAGNOSIS — R0789 Other chest pain: Secondary | ICD-10-CM | POA: Insufficient documentation

## 2024-10-09 DIAGNOSIS — R042 Hemoptysis: Secondary | ICD-10-CM | POA: Insufficient documentation

## 2024-10-09 LAB — BASIC METABOLIC PANEL WITH GFR
Anion gap: 10 (ref 5–15)
BUN: 12 mg/dL (ref 8–23)
CO2: 30 mmol/L (ref 22–32)
Calcium: 8.9 mg/dL (ref 8.9–10.3)
Chloride: 99 mmol/L (ref 98–111)
Creatinine, Ser: 1.25 mg/dL — ABNORMAL HIGH (ref 0.61–1.24)
GFR, Estimated: 57 mL/min — ABNORMAL LOW (ref >60)
Glucose, Bld: 105 mg/dL — ABNORMAL HIGH (ref 70–99)
Potassium: 4.4 mmol/L (ref 3.5–5.1)
Sodium: 139 mmol/L (ref 135–145)

## 2024-10-09 LAB — CBC
HCT: 50.3 % (ref 39.0–52.0)
Hemoglobin: 16.3 g/dL (ref 13.0–17.0)
MCH: 31.8 pg (ref 26.0–34.0)
MCHC: 32.4 g/dL (ref 30.0–36.0)
MCV: 98.2 fL (ref 80.0–100.0)
Platelets: 202 K/uL (ref 150–400)
RBC: 5.12 MIL/uL (ref 4.22–5.81)
RDW: 13.7 % (ref 11.5–15.5)
WBC: 15.6 K/uL — ABNORMAL HIGH (ref 4.0–10.5)
nRBC: 0 % (ref 0.0–0.2)

## 2024-10-09 LAB — D-DIMER, QUANTITATIVE: D-Dimer, Quant: 1.65 ug{FEU}/mL — ABNORMAL HIGH (ref 0.00–0.50)

## 2024-10-09 LAB — TROPONIN T, HIGH SENSITIVITY
Troponin T High Sensitivity: 29 ng/L — ABNORMAL HIGH (ref 0–19)
Troponin T High Sensitivity: 25 ng/L — ABNORMAL HIGH (ref 0–19)

## 2024-10-09 MED ORDER — AZITHROMYCIN 250 MG PO TABS: 250.0000 mg | ORAL_TABLET | Freq: Every day | ORAL | 0 refills | Status: AC

## 2024-10-09 MED ORDER — AZITHROMYCIN 250 MG PO TABS
500.0000 mg | ORAL_TABLET | Freq: Once | ORAL | Status: AC
  Administered 2024-10-09: 500 mg via ORAL
  Filled 2024-10-09: qty 2

## 2024-10-09 MED ORDER — DEXAMETHASONE SOD PHOSPHATE PF 10 MG/ML IJ SOLN
10.0000 mg | Freq: Once | INTRAMUSCULAR | Status: AC
  Administered 2024-10-09: 10 mg via INTRAVENOUS

## 2024-10-09 MED ORDER — SODIUM CHLORIDE 0.9 % IV BOLUS
500.0000 mL | Freq: Once | INTRAVENOUS | Status: AC
  Administered 2024-10-09: 500 mL via INTRAVENOUS

## 2024-10-09 MED ORDER — PREDNISONE 50 MG PO TABS: 60.0000 mg | ORAL_TABLET | ORAL | Status: DC

## 2024-10-09 MED ORDER — IOHEXOL 350 MG/ML SOLN
75.0000 mL | Freq: Once | INTRAVENOUS | Status: AC | PRN
  Administered 2024-10-09: 75 mL via INTRAVENOUS

## 2024-10-09 MED ORDER — PREDNISONE 20 MG PO TABS: 40.0000 mg | ORAL_TABLET | Freq: Every day | ORAL | 0 refills | Status: DC

## 2024-10-09 NOTE — ED Notes (Signed)
 Patient transported to CT

## 2024-10-09 NOTE — Discharge Instructions (Signed)
 As discussed, with your history of lung mass, and your cough, as well as smoking it is very important you follow-up with your oncologist.  Take all medication as directed and return here if you need to for concerning changes.

## 2024-10-09 NOTE — ED Triage Notes (Signed)
 Pt c/o chest pain x 3 nights; pt state the pain is worse when he lies down; pt has had a cough x 3 weeks  Pt states this am he started to cough up blood and states it is dark with some bright red mixed in

## 2024-10-09 NOTE — ED Provider Notes (Signed)
 [place] EMERGENCY DEPARTMENT AT [facility] Provider Note   CSN: [id number] Arrival date & time: 10/09/24  9040     Patient presents with: Chest Pain   Jonathan Greene is a 83 y.o. male.   HPI Adult male arrives for concern of hemoptysis, chest tightness and cough. Patient relapsed into smoking cigarettes several years ago, is a daily smoker. With past 3 days he has had hemoptysis, cough, chest tightness, intermittently, including now. There is some associated left greater than right chest pain, worse with coughing. No fever, syncope, confusion, disorientation. Patient has with him a handkerchief with bloody sputum.    Prior to Admission medications   Medication Sig Start Date End Date Taking? Authorizing Provider  albuterol  (PROVENTIL ) (2.5 MG/3ML) 0.083% nebulizer solution Take 3 mLs (2.5 mg total) by nebulization every 6 (six) hours as needed for wheezing or shortness of breath. 04/15/24  Yes Cobb, Comer GAILS, NP  amLODipine  (NORVASC ) 10 MG tablet Take 1 tablet (10 mg total) by mouth daily. 08/05/24  Yes Miriam Norris, NP  APPLE CIDER VINEGAR PO Take 450 mg by mouth in the morning and at bedtime.   Yes [provider]  azithromycin (ZITHROMAX) 250 MG tablet Take 1 tablet (250 mg total) by mouth daily for 4 days. Take 1 every day until finished. 10/09/24 10/13/24 Yes Garrick Charleston, MD  B Complex-C (B-COMPLEX WITH VITAMIN C) tablet Take 1 tablet by mouth daily.   Yes [provider]  budesonide -glycopyrrolate -formoterol  (BREZTRI  AEROSPHERE) 160-9-4.8 MCG/ACT AERO inhaler Inhale 2 puffs into the lungs in the morning and at bedtime. 07/03/24  Yes Cobb, Comer GAILS, NP  escitalopram  (LEXAPRO ) 5 MG tablet Take 5 mg by mouth in the morning.   Yes [provider]  lisinopril  (ZESTRIL ) 20 MG tablet Take 20 mg by mouth daily. 10/02/24  Yes [provider]  Magnesium Oxide -Mg Supplement (MAG-OXIDE) 200 MG TABS Take 1 tablet (200 mg total)  by mouth in the morning and at bedtime. 09/11/24  Yes Miriam Norris, NP  metoprolol  succinate (TOPROL -XL) 25 MG 24 hr tablet Take 25 mg by mouth daily. 09/22/24  Yes [provider]  MILK THISTLE PO Take 1 capsule by mouth in the morning and at bedtime.   Yes [provider]  omeprazole (PRILOSEC) 20 MG capsule Take 20 mg by mouth daily.   Yes [provider]  predniSONE  (DELTASONE ) 20 MG tablet Take 2 tablets (40 mg total) by mouth daily with breakfast. For the next four days 10/09/24  Yes Garrick Charleston, MD  rivaroxaban  (XARELTO ) 20 MG TABS tablet Take 20 mg by mouth daily. 09/22/24  Yes [provider]  simvastatin  (ZOCOR ) 40 MG tablet Take 40 mg by mouth every other day. At bedtime. 12/15/14  Yes [provider]  solifenacin (VESICARE) 10 MG tablet Take 10 mg by mouth in the morning. 02/05/19  Yes [provider]  tadalafil  (CIALIS ) 10 MG tablet Take 1 tablet (10 mg total) by mouth daily as needed. 02/07/24  Yes Miriam Norris, NP  tamsulosin  (FLOMAX ) 0.4 MG CAPS capsule Take 0.4 mg by mouth daily. 09/22/24  Yes [provider]  Vitamin D-Vitamin K (VITAMIN K2-VITAMIN D3 PO) Take 1 capsule by mouth daily.   Yes [provider]  albuterol  (VENTOLIN  HFA) 108 (90 Base) MCG/ACT inhaler Inhale 1-2 puffs into the lungs every 4 (four) hours as needed. 03/06/24   Darlean Ozell NOVAK, MD  lisinopril  (ZESTRIL ) 10 MG tablet Take one tablet (10 mg dose) by  mouth daily. 09/09/24   [provider]    Allergies: Patient has no known allergies.    Review of Systems  Updated Vital Signs BP (!) 151/103   Pulse 86   Temp 98.4 F (36.9 C) (Oral)   Resp 20   Ht 1.88 m (6' 2)   Wt 98.9 kg   SpO2 98%   BMI 27.99 kg/m   Physical Exam Vitals and nursing note reviewed.  Constitutional:      General: He is not in acute distress.    Appearance: He is well-developed.  HENT:     Head: Normocephalic and atraumatic.  Eyes:      Conjunctiva/sclera: Conjunctivae normal.  Cardiovascular:     Rate and Rhythm: Normal rate and regular rhythm.  Pulmonary:     Effort: Pulmonary effort is normal. No respiratory distress.     Breath sounds: No stridor.  Abdominal:     General: There is no distension.  Skin:    General: Skin is warm and dry.  Neurological:     Mental Status: He is alert and oriented to person, place, and time.     (all labs ordered are listed, but only abnormal results are displayed) Labs Reviewed  BASIC METABOLIC PANEL WITH GFR - Abnormal; Notable for the following components:      Result Value   Glucose, Bld 105 (*)    Creatinine, Ser 1.25 (*)    GFR, Estimated 57 (*)    All other components within normal limits  CBC - Abnormal; Notable for the following components:   WBC 15.6 (*)    All other components within normal limits  D-DIMER, QUANTITATIVE - Abnormal; Notable for the following components:   D-Dimer, Quant 1.65 (*)    All other components within normal limits  TROPONIN T, HIGH SENSITIVITY - Abnormal; Notable for the following components:   Troponin T High Sensitivity 29 (*)    All other components within normal limits  TROPONIN T, HIGH SENSITIVITY - Abnormal; Notable for the following components:   Troponin T High Sensitivity 25 (*)    All other components within normal limits    EKG: None  Radiology: CT Angio Chest PE W and/or Wo Contrast Result Date: 10/09/2024 CLINICAL DATA:  Concern for pulmonary embolism. EXAM: CT ANGIOGRAPHY CHEST WITH CONTRAST TECHNIQUE: Multidetector CT imaging of the chest was performed using the standard protocol during bolus administration of intravenous contrast. Multiplanar CT image reconstructions and MIPs were obtained to evaluate the vascular anatomy. RADIATION DOSE REDUCTION: This exam was performed according to the departmental dose-optimization program which includes automated exposure control, adjustment of the mA and/or kV according to patient  size and/or use of iterative reconstruction technique. CONTRAST:  75mL OMNIPAQUE  IOHEXOL  350 MG/ML SOLN COMPARISON:  Chest CT dated 09/11/2024. FINDINGS: Cardiovascular: There is stable cardiomegaly. No pericardial effusion. There is 3 vessel coronary vascular calcification. Mild atherosclerotic calcification of the thoracic aorta. The ascending aorta is mildly dilated measuring 4.2 cm in diameter. There is mild dilatation of the main pulmonary trunk suggestive of pulmonary hypertension. No pulmonary artery embolus identified. Mediastinum/Nodes: No obvious hilar adenopathy. Enlarged mediastinal lymph node measures 16 mm short axis anterior to the carina. Small hiatal hernia. The esophagus is grossly unremarkable. No mediastinal fluid collection. Lungs/Pleura: Background of emphysema. Masslike consolidation in the left upper lobe has progressed since the prior CT. Although this may represent pneumonia, underlying mass is not excluded. Clinical correlation and close follow-up to resolution after treatment recommended. No pleural effusion or  pneumothorax. The central airways are patent. Upper Abdomen: No acute abnormality. Musculoskeletal: Osteopenia with degenerative changes of the spine. No acute osseous pathology. Review of the MIP images confirms the above findings. IMPRESSION: 1. No CT evidence of pulmonary artery embolus. 2. Masslike consolidation in the left upper lobe has progressed since the prior CT. Follow-up recommended. 3. 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 Recommend annual imaging followup by CTA or MRA. This recommendation follows 2010 ACCF/AHA/AATS/ACR/ASA/SCA/SCAI/SIR/STS/SVM Guidelines for the Diagnosis and Management of Patients with Thoracic Aortic Disease. Circulation. 2010; 121: Z733-z630. Aortic aneurysm NOS (ICD10-I71.9) 4.  Aortic Atherosclerosis (ICD10-I70.0). Electronically Signed   By: Vanetta Chou M.D.   On: 10/09/2024 14:18   DG Chest Port 1 View Result  Date: 10/09/2024 CLINICAL DATA:  Hemoptysis and chest pain as well as cough. History of lung cancer. EXAM: PORTABLE CHEST 1 VIEW COMPARISON:  Chest x-ray 04/15/2024, 05/23/2022, 10/24/2022, 06/04/2023, chest CT 09/11/2024 FINDINGS: Lungs are adequately inflated and demonstrate persistent post treatment changes over the left upper lobe in this patient with known treated lung cancer in this region. Minimal stable interstitial prominence over the right upper lung. No acute lobar consolidation or effusion. Mild stable cardiomegaly. Remainder of the exam is unchanged. IMPRESSION: 1. No acute cardiopulmonary disease. 2. Stable post treatment changes over the left upper lobe in this patient with known treated lung cancer in this region. 3. Stable mild cardiomegaly. Electronically Signed   By: Toribio Agreste M.D.   On: 10/09/2024 12:19     Procedures   Medications Ordered in the ED  azithromycin [facility]) tablet 500 mg (has no administration in time range)  dexamethasone  (DECADRON ) injection 10 mg (10 mg Intravenous Given 10/09/24 1112)  sodium chloride  0.9 % bolus 500 mL (0 mLs Intravenous Stopped 10/09/24 1212)  iohexol  (OMNIPAQUE ) 350 MG/ML injection 75 mL (75 mLs Intravenous Contrast Given 10/09/24 1318)                                    Medical Decision Making Adult male, daily smoker presents with hemoptysis, cough, chest pain. Broad differential including ACS, PE, pneumonia, bronchitis. Cardiac 90 sinus normal pulse ox 95% room air normal  Amount and/or Complexity of Data Reviewed External Data Reviewed: notes. Labs: ordered. Decision-making details documented in ED Course. Radiology: ordered and independent interpretation performed. Decision-making details documented in ED Course. ECG/medicine tests: ordered and independent interpretation performed. Decision-making details documented in ED Course.  Risk Prescription drug management. Decision regarding hospitalization. Diagnosis or  treatment significantly limited by social determinants of health.   3:04 PM On repeat exam patient has no new oxygen requirement, is awake, alert, speaking clearly.  I demonstrated the CT imaging to him at bedside, illustrating the left upper lobe mass with changes since recent studies. Evidence for pneumonia, PE, ACS.  Patient comfortable discharge will follow-up with oncologist after initiation of meds.   Final diagnoses:  Atypical chest pain  Lung mass    ED Discharge Orders          Ordered    azithromycin (ZITHROMAX) 250 MG tablet  Daily        10/09/24 1504    predniSONE  (DELTASONE ) 20 MG tablet  Daily with breakfast        10/09/24 1504               Garrick Charleston, MD 10/09/24 1505

## 2024-10-10 ENCOUNTER — Telehealth: Payer: Self-pay | Admitting: *Deleted

## 2024-10-10 ENCOUNTER — Telehealth: Payer: Self-pay

## 2024-10-10 ENCOUNTER — Telehealth: Payer: Self-pay | Admitting: Urology

## 2024-10-10 NOTE — Telephone Encounter (Signed)
 Called patient to inform of appt,. with Dr. Madelin Stank being rescheduled for 12-09-24 @ 2 pm, address [address] ph. [phone], spoke with patient and he is aware of this appt.

## 2024-10-10 NOTE — Telephone Encounter (Signed)
 Called pt to advise him that a message will be sent to MD dahlstedt for approval

## 2024-10-10 NOTE — Telephone Encounter (Signed)
 The patient called to request a medication refill of Prostaglandin. Patient would like the medication sent to [facility] pharmacy.

## 2024-10-20 ENCOUNTER — Telehealth: Payer: Self-pay | Admitting: *Deleted

## 2024-10-20 NOTE — Telephone Encounter (Addendum)
 Called pt to make him aware Rx called into pharmacy. Spoke with Dusty to confirmed pt RX at [facility]. Pt Rx trimix 30-1-40

## 2024-10-20 NOTE — Addendum Note (Signed)
 Addended by: GRETTA MASTERS R on: 10/20/2024 12:40 PM   Modules accepted: Orders

## 2024-10-20 NOTE — Telephone Encounter (Signed)
 Returned patient's phone call, spoke with patient

## 2024-10-23 ENCOUNTER — Ambulatory Visit: Admitting: Adult Health

## 2024-10-23 ENCOUNTER — Encounter: Payer: Self-pay | Admitting: Adult Health

## 2024-10-23 VITALS — BP 110/84 | HR 76 | Temp 98.1°F | Ht 74.0 in | Wt 214.4 lb

## 2024-10-23 DIAGNOSIS — Z720 Tobacco use: Secondary | ICD-10-CM

## 2024-10-23 DIAGNOSIS — R042 Hemoptysis: Secondary | ICD-10-CM | POA: Diagnosis not present

## 2024-10-23 DIAGNOSIS — C3412 Malignant neoplasm of upper lobe, left bronchus or lung: Secondary | ICD-10-CM

## 2024-10-23 DIAGNOSIS — J449 Chronic obstructive pulmonary disease, unspecified: Secondary | ICD-10-CM

## 2024-10-23 DIAGNOSIS — J441 Chronic obstructive pulmonary disease with (acute) exacerbation: Secondary | ICD-10-CM

## 2024-10-23 NOTE — Progress Notes (Signed)
 @Patient  ID: Jonathan Greene, male    DOB: 1941/02/18, 82 y.o.   MRN: [id number]  Chief Complaint  Patient presents with   COPD    Referring provider: Suanne Pfeiffer, NP  HPI: 83 year old male smoker followed for COPD with emphysema, lung nodularity, hypermetabolic left upper lobe lung nodule presumed lung cancer status post SBRT -February 2024, history of asbestos exposure, obstructive sleep apnea Echo history significant for A-fib and ascending thoracic aortic aneurysm, stroke    TEST/EVENTS : Reviewed 10/23/2024  PFT 06/30/20 >> FEV1 3.50 (99%), FEV1% 77, TLC 7.09 (90%), DLCO 51% Lt upper lobe core needle biopsy 07/01/20 >> benign lung with inflammation and fibrosis   Chest Imaging:  CT of the chest without contrast on 06/03/2020 showed 2.3 x 2.2 x 4.4 cm subpleural mass like consolidation in the left upper lobe corresponding to the density seen on the prior radiograph. -Patient smoked for 33 years, quit smoking at age 31.  He smoked on average of 1 pack/day for most years and at the time of quitting, was smoking 3 packs/day of cigarettes. -He worked as a psychologist, occupational and was administrator, sports.  He reports asbestos exposure throughout his career. -He was reportedly diagnosed to have asbestos-related lung disease and was seen by an attorney and his doctor in [place] . -PET scan on 06/16/2020 shows posterior left upper lobe pleural-based hypermetabolic nodule, 2.4 x 2.3 cm SUV 6.2.  AP window lymph node measures 6 mm with SUV 4.3.  No hypermetabolic extrathoracic metastatic disease. -MRI of the brain on 06/22/2020 shows single suspicious focus of enhancement along the inferolateral margin of the temporal lobe on the left measuring 4 mm, not well seen on sagittal or coronal postcontrast imaging.  Unsure if it is small metastasis or a surface vessel.  No second suspicious focus identified.  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 -Left  upper lobe needle biopsy on 07/01/2020 shows lung tissue with a lymphoplasmacytic infiltrate and organizing pneumonia type changes and fibrosis.  IHC negative for tumor. -PET scan on 08/16/2020 shows subpleural left upper lobe lung nodule measuring 2.2 x 1.2 cm, previously 2.8 x 2.2 cm.  SUV 3.4, previously 6.2.  Previously described hypermetabolic AP window node has resolved. - CT chest on 11/18/2020 showed further signs of regression of the left upper lobe mass.   Echo 06/30/20 >> EF 55 to 60%, mod LVH   CT chest 07/18/21 >> ascending aorta 4.3 cm, atherosclerosis, mod emphysema, new LUL patchy consolidation, plaque like lesion LUL   CT chest 12/06/21 >> 4.4 cm ascending thoracic aorta, severe emphysema, 2.4 x 0.4 cm mass LUL CT chest August 2023 showed a significantly increased size of the pleural-based nodule in the lateral aspect of the left upper lobe measuring 2.1 x 1.5 cm.    PET scan on August 18, 2022 showed enlarging subpleural nodule peripherally in the left upper lobe intensely hypermetabolic consistent with probable malignancy.    navigational bronchoscopy that was done on October 24, 2022. Cytology was nondiagnostic. Cultures were negative. Unfortunately during navigational bronchoscopy several samples were attempted however had associated hemorrhage around the surrounding parenchyma of the lesion   CT chest that was completed on December 14, 2022 that showed stable ascending thoracic aorta measuring at 4.4 cm., Severe emphysema, increased spiculated subpleural solid nodule in the left upper lobe measuring 2.4 x 1.4 cm previously at 1.7 x 1.5 cm.    HST 06/17/23 >> AHI 6.8, SpO2 low 85%  CT chest September 11, 2024, emphysema, chronic peripheral interstitial attenuation, consolidation left upper lobe compatible with prior radiation therapy slightly smaller in volume but more dense than on prior exam stable ascending thoracic aortic aneurysm  CT chest October 09, 2024 enlarged  mediastinal lymph node 16 mm, masslike consolidation left upper lobe with progression     Discussed the use of AI scribe software for clinical note transcription with the patient, who gave verbal consent to proceed.  History of Present Illness Jonathan Greene is an 83 year old male with COPD who presents with recent exacerbation and hemoptysis.  Approximately two weeks ago, he experienced a sudden onset of hemoptysis and pain in the upper left chest, prompting a visit to the emergency room. A CT angiogram of the chest ruled out a pulmonary embolism.  CT showed enlarged mediastinal lymph node at 16 mm and masslike consolidation in the left upper lobe with progression.  He was treated with antibiotics and prednisone , which he completed, and the bleeding has since stopped.  Patient says he is feeling better.  He has a persistent cough that worsens at night and upon waking, producing significant mucus.  This is chronic in nature.  He is currently using Breztri , two puffs in the morning and two at night, but has not been using his nebulizer. He also takes Robitussin for cough and congestion.  Has a history of presumed lung cancer with hypermetabolic left upper lobe nodule status post radiation therapy in February 2024.  PET scan in September 2023 showed an enlarging subpleural nodule peripherally in the left upper lobe with intense hypermetabolic activity consistent with probable malignancy.  He underwent a navigational bronchoscopy October 24, 2022 with cytology being nondiagnostic.  Cultures were negative.  Procedure was complicated by associated hemorrhage around the surrounding parenchymal of the lesion.  Follow-up CT chest December 14, 2022 showed increased spiculated subpleural solid nodule in the left upper lobe measuring 2.4 x 1.4.  Patient underwent SBRT in February 2024.  Surveillance CT imaging has shown evolving post radiation changes until most recent CT chest as above from emergency room  visit on October 09, 2024.  Patient has atrial fibrillation. he is on Xarelto  for anticoagulation and has not missed any doses.  He has a history of smoking and currently smokes about half a pack a day, often with his evening cocktails. He previously quit smoking at age 18 but resumed later. He is also on lisinopril  for blood pressure management, which he started after other medications failed to control his hypertension.  He underwent a hip replacement earlier this year, which was delayed due to a bacterial infection causing diarrhea. Post-surgery, he reports improved mobility and no longer experiences swelling in his legs after a fall several weeks ago. No current oxygen use at home.     No Known Allergies  Immunization History  Administered Date(s) Administered   Fluad Quad(high Dose 65+) 09/15/2020, 09/16/2021, 09/01/2022   INFLUENZA, HIGH DOSE SEASONAL PF 09/06/2018, 08/18/2019, 09/15/2020   Moderna Sars-Covid-2 Vaccination 11/19/2020   PNEUMOCOCCAL CONJUGATE-20 01/18/2022   Pneumococcal Conjugate-13 08/10/2015   Tdap 03/24/2022   Zoster Recombinant(Shingrix) 10/18/2015   Zoster, Live 10/18/2015    Past Medical History:  Diagnosis Date   A-fib (HCC)    Arthritis    right hand   Bradycardia    COPD (chronic obstructive pulmonary disease) (HCC)    Dyspnea    Dysrhythmia    Frequent urination    GERD (gastroesophageal reflux disease)    Hypercholesterolemia  Hypertension    Pneumonia    Pre-diabetes    Prostate cancer (HCC)    RADIATION TX AND SCHEDULED FOR SEED IMPLANTS 02-28-12   Sleep apnea    uses a cpap   Stroke (HCC)    2011;ST memory loss, no other deficits.   Urinary urgency     Tobacco History: Social History   Tobacco Use  Smoking Status Some Days   Current packs/day: 0.00   Average packs/day: 2.0 packs/day for 35.0 years (70.0 ttl pk-yrs)   Types: Cigarettes   Start date: 02/21/1956   Last attempt to quit: 05/04/2024   Years since quitting: 0.4    Passive exposure: Current  Smokeless Tobacco Current   Types: Chew  Tobacco Comments   CHEW TOBACCO FOR 20 YRS, is smoking about 1/2ppd   Ready to quit: Not Answered Counseling given: Not Answered Tobacco comments: CHEW TOBACCO FOR 20 YRS, is smoking about 1/2ppd   Outpatient Medications Prior to Visit  Medication Sig Dispense Refill   albuterol  (PROVENTIL ) (2.5 MG/3ML) 0.083% nebulizer solution Take 3 mLs (2.5 mg total) by nebulization every 6 (six) hours as needed for wheezing or shortness of breath. 75 mL 3   albuterol  (VENTOLIN  HFA) 108 (90 Base) MCG/ACT inhaler Inhale 1-2 puffs into the lungs every 4 (four) hours as needed. 18 g 1   APPLE CIDER VINEGAR PO Take 450 mg by mouth in the morning and at bedtime.     B Complex-C (B-COMPLEX WITH VITAMIN C) tablet Take 1 tablet by mouth daily.     budesonide -glycopyrrolate -formoterol  (BREZTRI  AEROSPHERE) 160-9-4.8 MCG/ACT AERO inhaler Inhale 2 puffs into the lungs in the morning and at bedtime. 10.7 g 11   escitalopram  (LEXAPRO ) 5 MG tablet Take 5 mg by mouth in the morning.     lisinopril  (ZESTRIL ) 20 MG tablet Take 20 mg by mouth daily. (Patient taking differently: Take 20 mg by mouth in the morning and at bedtime.)     Magnesium Oxide -Mg Supplement (MAG-OXIDE) 200 MG TABS Take 1 tablet (200 mg total) by mouth in the morning and at bedtime. 180 tablet 3   metoprolol  succinate (TOPROL -XL) 25 MG 24 hr tablet Take 25 mg by mouth daily.     MILK THISTLE PO Take 1 capsule by mouth in the morning and at bedtime.     omeprazole (PRILOSEC) 20 MG capsule Take 20 mg by mouth daily.     rivaroxaban  (XARELTO ) 20 MG TABS tablet Take 20 mg by mouth daily.     simvastatin  (ZOCOR ) 40 MG tablet Take 40 mg by mouth every other day. At bedtime.     solifenacin (VESICARE) 10 MG tablet Take 10 mg by mouth in the morning.     tadalafil  (CIALIS ) 10 MG tablet Take 1 tablet (10 mg total) by mouth daily as needed. 10 tablet 2   tamsulosin  (FLOMAX ) 0.4 MG CAPS  capsule Take 0.4 mg by mouth daily.     Vitamin D-Vitamin K (VITAMIN K2-VITAMIN D3 PO) Take 1 capsule by mouth daily.     amLODipine  (NORVASC ) 10 MG tablet Take 1 tablet (10 mg total) by mouth daily. (Patient not taking: Reported on 10/23/2024) 90 tablet 1   lisinopril  (ZESTRIL ) 10 MG tablet Take one tablet (10 mg dose) by mouth daily. (Patient not taking: Reported on 10/23/2024)     predniSONE  (DELTASONE ) 20 MG tablet Take 2 tablets (40 mg total) by mouth daily with breakfast. For the next four days (Patient not taking: Reported on 10/23/2024) 8 tablet 0  No facility-administered medications prior to visit.     Review of Systems:   Constitutional:   No  weight loss, night sweats,  Fevers, chills, fatigue, or  lassitude.  HEENT:   No headaches,  Difficulty swallowing,  Tooth/dental problems, or  Sore throat,                No sneezing, itching, ear ache, nasal congestion, post nasal drip,   CV:  No chest pain,  Orthopnea, PND, swelling in lower extremities, anasarca, dizziness, palpitations, syncope.   GI  No heartburn, indigestion, abdominal pain, nausea, vomiting, diarrhea, change in bowel habits, loss of appetite, bloody stools.   Resp: No shortness of breath with exertion or at rest.  No excess mucus, no productive cough,  No non-productive cough,  No coughing up of blood.  No change in color of mucus.  No wheezing.  No chest wall deformity  Skin: no rash or lesions.  GU: no dysuria, change in color of urine, no urgency or frequency.  No flank pain, no hematuria   MS:  No joint pain or swelling.  No decreased range of motion.  No back pain.    Physical Exam  BP 110/84   Pulse 76   Temp 98.1 F (36.7 C)   Ht 6' 2 (1.88 m) Comment: Per pt  Wt 214 lb 6.4 oz (97.3 kg)   SpO2 96% Comment: RA  BMI 27.53 kg/m   GEN: A/Ox3; pleasant , NAD, well nourished    HEENT:  [place]/AT,   NOSE-clear, THROAT-clear, no lesions, no postnasal drip or exudate noted.   NECK:  Supple w/ fair  ROM; no JVD; normal carotid impulses w/o bruits; no thyromegaly or nodules palpated; no lymphadenopathy.    RESP  Clear  P & A; w/o, wheezes/ rales/ or rhonchi. no accessory muscle use, no dullness to percussion  CARD:  RRR, no m/r/g, no peripheral edema, pulses intact, no cyanosis or clubbing.  GI:   Soft & nt; nml bowel sounds; no organomegaly or masses detected.   Musco: Warm bil, no deformities or joint swelling noted.   Neuro: alert, no focal deficits noted.    Skin: Warm, no lesions or rashes    Lab Results:Reviewed 10/23/2024   CBC  BNP    Component Value Date/Time   BNP 127.2 (H) 09/04/2024 1508    ProBNP    Component Value Date/Time   PROBNP 166.0 (H) 09/30/2021 1241    Imaging: CT Angio Chest PE W and/or Wo Contrast Result Date: 10/09/2024 CLINICAL DATA:  Concern for pulmonary embolism. EXAM: CT ANGIOGRAPHY CHEST WITH CONTRAST TECHNIQUE: Multidetector CT imaging of the chest was performed using the standard protocol during bolus administration of intravenous contrast. Multiplanar CT image reconstructions and MIPs were obtained to evaluate the vascular anatomy. RADIATION DOSE REDUCTION: This exam was performed according to the departmental dose-optimization program which includes automated exposure control, adjustment of the mA and/or kV according to patient size and/or use of iterative reconstruction technique. CONTRAST:  75mL OMNIPAQUE  IOHEXOL  350 MG/ML SOLN COMPARISON:  Chest CT dated 09/11/2024. FINDINGS: Cardiovascular: There is stable cardiomegaly. No pericardial effusion. There is 3 vessel coronary vascular calcification. Mild atherosclerotic calcification of the thoracic aorta. The ascending aorta is mildly dilated measuring 4.2 cm in diameter. There is mild dilatation of the main pulmonary trunk suggestive of pulmonary hypertension. No pulmonary artery embolus identified. Mediastinum/Nodes: No obvious hilar adenopathy. Enlarged mediastinal lymph node measures 16 mm  short axis anterior to the carina. Small hiatal hernia.  The esophagus is grossly unremarkable. No mediastinal fluid collection. Lungs/Pleura: Background of emphysema. Masslike consolidation in the left upper lobe has progressed since the prior CT. Although this may represent pneumonia, underlying mass is not excluded. Clinical correlation and close follow-up to resolution after treatment recommended. No pleural effusion or pneumothorax. The central airways are patent. Upper Abdomen: No acute abnormality. Musculoskeletal: Osteopenia with degenerative changes of the spine. No acute osseous pathology. Review of the MIP images confirms the above findings. IMPRESSION: 1. No CT evidence of pulmonary artery embolus. 2. Masslike consolidation in the left upper lobe has progressed since the prior CT. Follow-up recommended. 3. 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 Recommend annual imaging followup by CTA or MRA. This recommendation follows 2010 ACCF/AHA/AATS/ACR/ASA/SCA/SCAI/SIR/STS/SVM Guidelines for the Diagnosis and Management of Patients with Thoracic Aortic Disease. Circulation. 2010; 121: [id number]. Aortic aneurysm NOS (ICD10-I71.9) 4.  Aortic Atherosclerosis (ICD10-I70.0). Electronically Signed   By: Vanetta Chou M.D.   On: 10/09/2024 14:18   DG Chest Port 1 View Result Date: 10/09/2024 CLINICAL DATA:  Hemoptysis and chest pain as well as cough. History of lung cancer. EXAM: PORTABLE CHEST 1 VIEW COMPARISON:  Chest x-ray 04/15/2024, 05/23/2022, 10/24/2022, 06/04/2023, chest CT 09/11/2024 FINDINGS: Lungs are adequately inflated and demonstrate persistent post treatment changes over the left upper lobe in this patient with known treated lung cancer in this region. Minimal stable interstitial prominence over the right upper lung. No acute lobar consolidation or effusion. Mild stable cardiomegaly. Remainder of the exam is unchanged. IMPRESSION: 1. No acute cardiopulmonary disease. 2. Stable  post treatment changes over the left upper lobe in this patient with known treated lung cancer in this region. 3. Stable mild cardiomegaly. Electronically Signed   By: Toribio Agreste M.D.   On: 10/09/2024 12:19    Administration History     None          Latest Ref Rng & Units 07/03/2024   10:53 AM 06/30/2020   10:08 AM 03/19/2015    9:19 AM  PFT Results  FVC-Pre L 4.31  4.45  4.87   FVC-Predicted Pre % 92  91  93   FVC-Post L 4.35  4.57  4.92   FVC-Predicted Post % 92  94  94   Pre FEV1/FVC % % 77  76  74   Post FEV1/FCV % % 78  77  74   FEV1-Pre L 3.31  3.40  3.63   FEV1-Predicted Pre % 98  96  94   FEV1-Post L 3.38  3.50  3.66   DLCO uncorrected ml/min/mmHg 15.05  14.37  20.98   DLCO UNC% % 55  51  53   DLCO corrected ml/min/mmHg 15.50     DLCO COR %Predicted % 56     DLVA Predicted % 66  64  52   TLC L 6.21  7.09  7.56   TLC % Predicted % 78  90  94   RV % Predicted % 58  79  94     No results found for: NITRICOXIDE      No data to display              Assessment & Plan:   Assessment and Plan Assessment & Plan COPD with chronic cough and mucus production  -recent exacerbation now improving and active smoker He experiences chronic cough and mucus production, worsening at night and upon waking. A recent exacerbation included hemoptysis and upper left chest pain. CT angio chest ruled out  pulmonary embolism but showed worsened masslike consolidation in the left upper lobe and mediastinal adenopathy . Differential diagnosis includes infectious/inflammatory versus malignancy  Lisinopril  may exacerbate the cough. A PET scan is ordered to evaluate mediastinal lymph node and left upper lobe changes. Continue Breztri  inhaler, two puffs in the morning and two at night. Initiate albuterol  nebulizer once daily to help clear mucus.  Encouraged on smoking cessation discuss with primary care provider about potential lisinopril -induced cough and consider alternative  antihypertensive medication. Use Robitussin or Delsym DM every four hours as needed for cough and congestion.  Left upper lobe lung cancer, status post radiation, under surveillance for progression   recent CT scan shows progressive left upper lobe consolidation  and mediastinal lymph node enlargement. A PET scan is ordered to assess for potential cancer progression or other pathology. A biopsy will be considered if the PET scan indicates suspicious findings.  Nicotine dependence, current smoker   He is a current smoker, approximately half a pack per day, having resumed smoking after previous cessation at age 65. Smoking occurs in the car and at home. Smoking cessation is crucial for COPD management. The goal is to stop smoking in the car and reduce cigarette consumption to eight per day. He is encouraged to use sugarless candy or a stress ball to manage cravings and plans to gradually reduce smoking by one cigarette per week if possible.  Hypertension, on lisinopril    Hypertension is managed with lisinopril , which may contribute to his cough, though the primary cause is likely COPD-related. Discuss with primary care provider about potential lisinopril -induced cough and consider alternative antihypertensive medication if indicated          Madelin Stank, NP 10/23/2024

## 2024-10-23 NOTE — Patient Instructions (Addendum)
 Set up for PET scan  Discuss with PCP that Lisinopril  may be aggravating your cough.  Continue on Breztri  2 puffs Twice daily, rinse after use.  Albuterol  inhaler or neb As needed   Work on quitting smoking  Activity as tolerated.  Follow up in 3-4 weeks and As needed   Please contact office for sooner follow up if symptoms do not improve or worsen or seek emergency care

## 2024-11-06 ENCOUNTER — Encounter (HOSPITAL_COMMUNITY)
Admission: RE | Admit: 2024-11-06 | Discharge: 2024-11-06 | Disposition: A | Source: Ambulatory Visit | Attending: Adult Health | Admitting: Adult Health

## 2024-11-06 DIAGNOSIS — C3412 Malignant neoplasm of upper lobe, left bronchus or lung: Secondary | ICD-10-CM

## 2024-11-06 MED ORDER — FLUDEOXYGLUCOSE F - 18 (FDG) INJECTION
11.2500 | Freq: Once | INTRAVENOUS | Status: AC | PRN
  Administered 2024-11-06: 11.25 via INTRAVENOUS

## 2024-11-11 ENCOUNTER — Encounter: Payer: Self-pay | Admitting: Cardiology

## 2024-11-13 ENCOUNTER — Ambulatory Visit: Payer: Self-pay | Admitting: Adult Health

## 2024-11-18 ENCOUNTER — Encounter: Payer: Self-pay | Admitting: Internal Medicine

## 2024-11-18 ENCOUNTER — Ambulatory Visit: Admitting: Adult Health

## 2024-11-18 ENCOUNTER — Encounter: Payer: Self-pay | Admitting: Adult Health

## 2024-11-18 VITALS — BP 120/72 | HR 96 | Temp 99.4°F | Ht 74.0 in | Wt 212.6 lb

## 2024-11-18 DIAGNOSIS — J441 Chronic obstructive pulmonary disease with (acute) exacerbation: Secondary | ICD-10-CM

## 2024-11-18 LAB — CBC WITH DIFFERENTIAL/PLATELET
Basophils Absolute: 0.1 K/uL (ref 0.0–0.1)
Basophils Relative: 0.9 % (ref 0.0–3.0)
Eosinophils Absolute: 0 K/uL (ref 0.0–0.7)
Eosinophils Relative: 0.2 % (ref 0.0–5.0)
HCT: 47.1 % (ref 39.0–52.0)
Hemoglobin: 15.5 g/dL (ref 13.0–17.0)
Lymphocytes Relative: 6.1 % — ABNORMAL LOW (ref 12.0–46.0)
Lymphs Abs: 1.1 K/uL (ref 0.7–4.0)
MCHC: 32.9 g/dL (ref 30.0–36.0)
MCV: 93 fl (ref 78.0–100.0)
Monocytes Absolute: 1.2 K/uL — ABNORMAL HIGH (ref 0.1–1.0)
Monocytes Relative: 7.1 % (ref 3.0–12.0)
Neutro Abs: 14.9 K/uL — ABNORMAL HIGH (ref 1.4–7.7)
Neutrophils Relative %: 85.7 % — ABNORMAL HIGH (ref 43.0–77.0)
Platelets: 175 K/uL (ref 150.0–400.0)
RBC: 5.07 Mil/uL (ref 4.22–5.81)
RDW: 14.9 % (ref 11.5–15.5)
WBC: 17.4 K/uL — ABNORMAL HIGH (ref 4.0–10.5)

## 2024-11-18 MED ORDER — AMOXICILLIN-POT CLAVULANATE 875-125 MG PO TABS: 1.0000 | ORAL_TABLET | Freq: Two times a day (BID) | ORAL | 0 refills | Status: DC

## 2024-11-18 MED ORDER — PREDNISONE 20 MG PO TABS: 20.0000 mg | ORAL_TABLET | Freq: Every day | ORAL | 0 refills | Status: DC

## 2024-11-18 NOTE — Progress Notes (Unsigned)
 @Patient  ID: Jonathan Greene, male    DOB: [birth year], 83 y.o.   MRN: [id number]  Chief Complaint  Patient presents with   Medical Management of Chronic Issues    PET f/u    Referring provider: Suanne Pfeiffer, NP  HPI: 83 year old male smoker followed for COPD with emphysema, and lung nodularity, hypermetabolic left upper lobe lung nodule presumed lung cancer status post SBRT in February 2024, history of asbestos exposure, obstructive sleep apnea, A-fib, ascending thoracic aortic aneurysm, stroke, prostate cancer     TEST/EVENTS : Reviewed 11/18/2024  PFT 06/30/20 >> FEV1 3.50 (99%), FEV1% 77, TLC 7.09 (90%), DLCO 51% Lt upper lobe core needle biopsy 07/01/20 >> benign lung with inflammation and fibrosis   Chest Imaging:  CT of the chest without contrast on 06/03/2020 showed 2.3 x 2.2 x 4.4 cm subpleural mass like consolidation in the left upper lobe corresponding to the density seen on the prior radiograph. -Patient smoked for 33 years, quit smoking at age 34.  He smoked on average of 1 pack/day for most years and at the time of quitting, was smoking 3 packs/day of cigarettes. -He worked as a psychologist, occupational and was administrator, sports.  He reports asbestos exposure throughout his career. -He was reportedly diagnosed to have asbestos-related lung disease and was seen by an attorney and his doctor in [place] . -PET scan on 06/16/2020 shows posterior left upper lobe pleural-based hypermetabolic nodule, 2.4 x 2.3 cm SUV 6.2.  AP window lymph node measures 6 mm with SUV 4.3.  No hypermetabolic extrathoracic metastatic disease. -MRI of the brain on 06/22/2020 shows single suspicious focus of enhancement along the inferolateral margin of the temporal lobe on the left measuring 4 mm, not well seen on sagittal or coronal postcontrast imaging.  Unsure if it is small metastasis or a surface vessel.  No second suspicious focus identified.  12 mm lesion in the right sylvian fissure consistent  with thrombosed MCA aneurysm. -Left upper lobe needle biopsy on 07/01/2020 shows lung tissue with a lymphoplasmacytic infiltrate and organizing pneumonia type changes and fibrosis.  IHC negative for tumor. -PET scan on 08/16/2020 shows subpleural left upper lobe lung nodule measuring 2.2 x 1.2 cm, previously 2.8 x 2.2 cm.  SUV 3.4, previously 6.2.  Previously described hypermetabolic AP window node has resolved. - CT chest on 11/18/2020 showed further signs of regression of the left upper lobe mass.   Echo 06/30/20 >> EF 55 to 60%, mod LVH   CT chest 07/18/21 >> ascending aorta 4.3 cm, atherosclerosis, mod emphysema, new LUL patchy consolidation, plaque like lesion LUL   CT chest 12/06/21 >> 4.4 cm ascending thoracic aorta, severe emphysema, 2.4 x 0.4 cm mass LUL CT chest August 2023 showed a significantly increased size of the pleural-based nodule in the lateral aspect of the left upper lobe measuring 2.1 x 1.5 cm.    PET scan on August 18, 2022 showed enlarging subpleural nodule peripherally in the left upper lobe intensely hypermetabolic consistent with probable malignancy.    navigational bronchoscopy that was done on October 24, 2022. Cytology was nondiagnostic. Cultures were negative. Unfortunately during navigational bronchoscopy several samples were attempted however had associated hemorrhage around the surrounding parenchyma of the lesion    CT chest that was completed on December 14, 2022 that showed stable ascending thoracic aorta measuring at 4.4 cm., Severe emphysema, increased spiculated subpleural solid nodule in the left upper lobe measuring 2.4 x 1.4 cm previously at 1.7 x 1.5 cm.  HST 06/17/23 >> AHI 6.8, SpO2 low 85%      CT chest September 11, 2024, emphysema, chronic peripheral interstitial attenuation, consolidation left upper lobe compatible with prior radiation therapy slightly smaller in volume but more dense than on prior exam stable ascending thoracic aortic aneurysm    CT chest October 09, 2024 enlarged mediastinal lymph node 16 mm, masslike consolidation left upper lobe with progression    Discussed the use of AI scribe software for clinical note transcription with the patient, who gave verbal consent to proceed.  History of Present Illness      Allergies[1]  Immunization History  Administered Date(s) Administered   Fluad Quad(high Dose 65+) 09/15/2020, 09/16/2021, 09/01/2022   INFLUENZA, HIGH DOSE SEASONAL PF 09/06/2018, 08/18/2019, 09/15/2020   Moderna Sars-Covid-2 Vaccination 11/19/2020   PNEUMOCOCCAL CONJUGATE-20 01/18/2022   Pneumococcal Conjugate-13 08/10/2015   Tdap 03/24/2022   Zoster Recombinant(Shingrix) 10/18/2015   Zoster, Live 10/18/2015    Past Medical History:  Diagnosis Date   A-fib (HCC)    Arthritis    right hand   Bradycardia    COPD (chronic obstructive pulmonary disease) (HCC)    Dyspnea    Dysrhythmia    Frequent urination    GERD (gastroesophageal reflux disease)    Hypercholesterolemia    Hypertension    Pneumonia    Pre-diabetes    Prostate cancer (HCC)    RADIATION TX AND SCHEDULED FOR SEED IMPLANTS 02-28-12   Sleep apnea    uses a cpap   Stroke (HCC)    2011;ST memory loss, no other deficits.   Urinary urgency     Tobacco History: Tobacco Use History[2] Ready to quit: Not Answered Counseling given: Not Answered Tobacco comments: CHEW TOBACCO FOR 20 YRS, is smoking about 1/2ppd   Outpatient Medications Prior to Visit  Medication Sig Dispense Refill   albuterol  (PROVENTIL ) (2.5 MG/3ML) 0.083% nebulizer solution Take 3 mLs (2.5 mg total) by nebulization every 6 (six) hours as needed for wheezing or shortness of breath. 75 mL 3   albuterol  (VENTOLIN  HFA) 108 (90 Base) MCG/ACT inhaler Inhale 1-2 puffs into the lungs every 4 (four) hours as needed. 18 g 1   amLODipine  (NORVASC ) 10 MG tablet Take 1 tablet (10 mg total) by mouth daily. (Patient not taking: Reported on 10/23/2024) 90 tablet 1   APPLE  CIDER VINEGAR PO Take 450 mg by mouth in the morning and at bedtime.     B Complex-C (B-COMPLEX WITH VITAMIN C) tablet Take 1 tablet by mouth daily.     budesonide -glycopyrrolate -formoterol  (BREZTRI  AEROSPHERE) 160-9-4.8 MCG/ACT AERO inhaler Inhale 2 puffs into the lungs in the morning and at bedtime. 10.7 g 11   cephALEXin (KEFLEX) 500 MG capsule Take 500 mg by mouth 2 (two) times daily.     escitalopram  (LEXAPRO ) 5 MG tablet Take 5 mg by mouth in the morning.     lisinopril  (ZESTRIL ) 10 MG tablet Take one tablet (10 mg dose) by mouth daily. (Patient not taking: Reported on 10/23/2024)     lisinopril  (ZESTRIL ) 20 MG tablet Take 20 mg by mouth daily. (Patient taking differently: Take 20 mg by mouth in the morning and at bedtime.)     Magnesium Oxide -Mg Supplement (MAG-OXIDE) 200 MG TABS Take 1 tablet (200 mg total) by mouth in the morning and at bedtime. 180 tablet 3   metoprolol  succinate (TOPROL -XL) 25 MG 24 hr tablet Take 25 mg by mouth daily.     MILK THISTLE PO Take 1 capsule by mouth  in the morning and at bedtime.     omeprazole (PRILOSEC) 20 MG capsule Take 20 mg by mouth daily.     rivaroxaban  (XARELTO ) 20 MG TABS tablet Take 20 mg by mouth daily.     simvastatin  (ZOCOR ) 40 MG tablet Take 40 mg by mouth every other day. At bedtime.     solifenacin (VESICARE) 10 MG tablet Take 10 mg by mouth in the morning.     tadalafil  (CIALIS ) 10 MG tablet Take 1 tablet (10 mg total) by mouth daily as needed. 10 tablet 2   tamsulosin  (FLOMAX ) 0.4 MG CAPS capsule Take 0.4 mg by mouth daily.     Vitamin D-Vitamin K (VITAMIN K2-VITAMIN D3 PO) Take 1 capsule by mouth daily.     No facility-administered medications prior to visit.     Review of Systems:   Constitutional:   No  weight loss, night sweats,  Fevers, chills, fatigue, or  lassitude.  HEENT:   No headaches,  Difficulty swallowing,  Tooth/dental problems, or  Sore throat,                No sneezing, itching, ear ache, nasal congestion, post  nasal drip,   CV:  No chest pain,  Orthopnea, PND, swelling in lower extremities, anasarca, dizziness, palpitations, syncope.   GI  No heartburn, indigestion, abdominal pain, nausea, vomiting, diarrhea, change in bowel habits, loss of appetite, bloody stools.   Resp: No shortness of breath with exertion or at rest.  No excess mucus, no productive cough,  No non-productive cough,  No coughing up of blood.  No change in color of mucus.  No wheezing.  No chest wall deformity  Skin: no rash or lesions.  GU: no dysuria, change in color of urine, no urgency or frequency.  No flank pain, no hematuria   MS:  No joint pain or swelling.  No decreased range of motion.  No back pain.    Physical Exam  There were no vitals taken for this visit.  GEN: A/Ox3; pleasant , NAD, well nourished    HEENT:  [place]/AT,  EACs-clear, TMs-wnl, NOSE-clear, THROAT-clear, no lesions, no postnasal drip or exudate noted.   NECK:  Supple w/ fair ROM; no JVD; normal carotid impulses w/o bruits; no thyromegaly or nodules palpated; no lymphadenopathy.    RESP  Clear  P & A; w/o, wheezes/ rales/ or rhonchi. no accessory muscle use, no dullness to percussion  CARD:  RRR, no m/r/g, no peripheral edema, pulses intact, no cyanosis or clubbing.  GI:   Soft & nt; nml bowel sounds; no organomegaly or masses detected.   Musco: Warm bil, no deformities or joint swelling noted.   Neuro: alert, no focal deficits noted.    Skin: Warm, no lesions or rashes    Lab Results:Reviewed 11/18/2024   CBC    Component Value Date/Time   WBC 15.6 (H) 10/09/2024 1100   RBC 5.12 10/09/2024 1100   HGB 16.3 10/09/2024 1100   HGB 15.9 04/15/2024 1042   HCT 50.3 10/09/2024 1100   HCT 48.0 04/15/2024 1042   PLT 202 10/09/2024 1100   PLT 198 04/15/2024 1042   MCV 98.2 10/09/2024 1100   MCV 99 (H) 04/15/2024 1042   MCH 31.8 10/09/2024 1100   MCHC 32.4 10/09/2024 1100   RDW 13.7 10/09/2024 1100   RDW 12.3 04/15/2024 1042    LYMPHSABS 1.4 04/15/2024 1042   MONOABS 0.8 03/24/2022 1447   EOSABS 0.2 04/15/2024 1042   BASOSABS 0.0 04/15/2024  1042    BMET    Component Value Date/Time   NA 139 10/09/2024 1100   NA 139 09/04/2024 1508   K 4.4 10/09/2024 1100   CL 99 10/09/2024 1100   CO2 30 10/09/2024 1100   GLUCOSE 105 (H) 10/09/2024 1100   BUN 12 10/09/2024 1100   BUN 9 09/04/2024 1508   CREATININE 1.25 (H) 10/09/2024 1100   CREATININE 1.34 (H) 11/30/2021 1459   CALCIUM 8.9 10/09/2024 1100   GFRNONAA 57 (L) 10/09/2024 1100   GFRAA >60 01/20/2017 1600    BNP    Component Value Date/Time   BNP 127.2 (H) 09/04/2024 1508    ProBNP    Component Value Date/Time   PROBNP 166.0 (H) 09/30/2021 1241    Imaging: NM PET Image Initial (PI) Skull Base To Thigh (F-18 FDG) Result Date: 11/11/2024 EXAM: PET AND CT SKULL BASE TO MID THIGH 11/06/2024 05:44:34 PM TECHNIQUE: RADIOPHARMACEUTICAL: 11.25 mCi F-18 FDG Uptake time 60 minutes. Glucose level 104 mg/dl. Blood pool SUV 1.7. PET imaging was acquired from the base of the skull to the mid thighs. Non-contrast enhanced computed tomography was obtained for attenuation correction and anatomic localization. COMPARISON: CT chest of 10/09/2024. PET of 08/18/2022. CLINICAL HISTORY: Known non-small cell lung cancer with prior SBRT. FINDINGS: HEAD AND NECK: No hypermetabolic cervical lymph nodes are identified. Photopenia within the right parietal lobe is likely due to remote infarct. A 12 mm partially calcified lesion within the right sylvian fissure was consistent with a thrombosed MCA aneurysm on 06/23/2020 brain MR. Bilateral carotid atherosclerosis. Hypermetabolism about the anterior tongue, SUV 6.7, is without CT correlate. CHEST: A precarinal node is newly enlarged at 1.4 cm and has an SUV of 7.8 on image 59/202. There are no hypermetabolic hilar or axillary lymph nodes. The anterior left upper lobe nodule described in the prior PET has undergone radiation. Secondary  consolidation and low-level diffuse activity including an SUV of 2.9 on image 57/202. Centrilobular and paraseptal emphysema. ABDOMEN AND PELVIS: There is no hypermetabolic activity within the liver, adrenal glands, spleen or pancreas. Normal adrenal glands. Mildly scarred right kidney. Scattered colonic diverticula. Radiation seeds in the prostate. Beam hardening artifact degrades evaluation of the pelvis secondary to left hip arthroplasty. There is no hypermetabolic nodal activity in the abdomen or pelvis. Physiologic activity within the gastrointestinal and genitourinary systems. BONES AND SOFT TISSUE: There is no hypermetabolic activity to suggest osseous metastatic disease. IMPRESSION: 1. Isolated mediastinal nodal metastasis. 2. Radiation-induced consolidation in the anterior left upper lobe, without residual or local recurrent disease. 3. Anterior tongue hypermetabolism without CT correlate. Most likely physiologic. Consider physical exam correlation. 4. Aortic atherosclerosis (icd10-i70.0), coronary artery atherosclerosis, and emphysema (icd10-j43.9). Electronically signed by: Rockey Kilts MD 11/11/2024 12:42 PM EST RP Workstation: [id number]    Administration History     None          Latest Ref Rng & Units 07/03/2024   10:53 AM 06/30/2020   10:08 AM 03/19/2015    9:19 AM  PFT Results  FVC-Pre L 4.31  4.45  4.87   FVC-Predicted Pre % 92  91  93   FVC-Post L 4.35  4.57  4.92   FVC-Predicted Post % 92  94  94   Pre FEV1/FVC % % 77  76  74   Post FEV1/FCV % % 78  77  74   FEV1-Pre L 3.31  3.40  3.63   FEV1-Predicted Pre % 98  96  94   FEV1-Post L 3.38  3.50  3.66   DLCO uncorrected ml/min/mmHg 15.05  14.37  20.98   DLCO UNC% % 55  51  53   DLCO corrected ml/min/mmHg 15.50     DLCO COR %Predicted % 56     DLVA Predicted % 66  64  52   TLC L 6.21  7.09  7.56   TLC % Predicted % 78  90  94   RV % Predicted % 58  79  94     No results found for: NITRICOXIDE      No data to  display              Assessment & Plan:   Assessment and Plan Assessment & Plan         Blondine Hottel, NP 11/18/2024  I spent   minutes dedicated to the care of this patient on the date of this encounter to include pre-visit review of records, face-to-face time with the patient discussing conditions above, post visit ordering of testing, clinical documentation with the electronic health record, making appropriate referrals as documented, and communicating necessary findings to members of the patients care team.     [1] No Known Allergies [2]  Social History Tobacco Use  Smoking Status Some Days   Current packs/day: 0.00   Average packs/day: 2.0 packs/day for 35.0 years (70.0 ttl pk-yrs)   Types: Cigarettes   Start date: 02/21/1956   Last attempt to quit: 05/04/2024   Years since quitting: 0.5   Passive exposure: Current  Smokeless Tobacco Current   Types: Chew  Tobacco Comments   CHEW TOBACCO FOR 20 YRS, is smoking about 1/2ppd

## 2024-11-18 NOTE — H&P (View-Only) (Signed)
 "  @Patient  ID: Jonathan Greene, male    DOB: 06-Dec-1940, 83 y.o.   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 -Patient smoked for 33 years, quit smoking at age 87.  He smoked on average of 1 pack/day for most years and at the time of quitting, was smoking 3 packs/day of cigarettes. -He worked as a psychologist, occupational and was administrator, sports.  He reports asbestos exposure throughout his career. -He was reportedly diagnosed to have asbestos-related lung disease and was seen by an attorney and his doctor in [place] . -PET scan on 06/16/2020 shows posterior left upper lobe pleural-based hypermetabolic nodule, 2.4 x 2.3 cm SUV 6.2.  AP window lymph node measures 6 mm with SUV 4.3.  No hypermetabolic extrathoracic metastatic disease. -MRI of the brain on 06/22/2020 shows single suspicious focus of enhancement along the inferolateral margin of the temporal lobe on the left measuring 4 mm, not well seen on sagittal or coronal postcontrast imaging.  Unsure if it is small metastasis or a surface vessel.  No second suspicious focus  identified.  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 -Left upper lobe needle biopsy on 07/01/2020 shows lung tissue with a lymphoplasmacytic infiltrate and organizing pneumonia type changes and fibrosis.  IHC negative for tumor. -PET scan on 08/16/2020 shows subpleural left upper lobe lung nodule measuring 2.2 x 1.2 cm, previously 2.8 x 2.2 cm.  SUV 3.4, previously 6.2.  Previously described hypermetabolic AP window node has resolved. - CT chest on 11/18/2020 showed further signs of regression of the left upper lobe mass.   Echo 06/30/20 >> EF 55 to 60%, mod LVH   CT chest 07/18/21 >> ascending aorta 4.3 cm, atherosclerosis, mod emphysema, new LUL patchy consolidation, plaque like lesion LUL   CT chest 12/06/21 >> 4.4 cm ascending thoracic aorta, severe emphysema, 2.4 x 0.4 cm mass LUL CT chest August 2023 showed a significantly increased size of the pleural-based nodule in the lateral aspect of the left upper lobe measuring 2.1 x 1.5 cm.    PET scan on August 18, 2022 showed enlarging subpleural nodule peripherally in the left upper lobe intensely hypermetabolic consistent with probable malignancy.    navigational bronchoscopy that was done on October 24, 2022. Cytology was nondiagnostic. Cultures were negative. Unfortunately during navigational bronchoscopy several samples were attempted however had associated hemorrhage around the surrounding parenchyma of the lesion    CT chest that was completed on December 14, 2022 that showed stable ascending thoracic aorta measuring at 4.4 cm., Severe emphysema, increased spiculated subpleural solid nodule in the left upper lobe measuring 2.4 x 1.4 cm previously at 1.7 x  1.5 cm.    HST 06/17/23 >> AHI 6.8, SpO2 low 85%      CT chest September 11, 2024, emphysema, chronic peripheral interstitial attenuation, consolidation left upper lobe compatible with prior radiation therapy slightly smaller in volume but more dense  than on prior exam stable ascending thoracic aortic aneurysm   CT chest October 09, 2024 enlarged mediastinal lymph node 16 mm, masslike consolidation left upper lobe with progression    Discussed the use of AI scribe software for clinical note transcription with the patient, who gave verbal consent to proceed.  History of Present Illness Jonathan Greene is an 83 year old male with history lung cancer who presents with concerns about an enlarged lymph node and recent symptoms of weakness and cough.  Patient has presumed lung cancer, on the left upper lobe.  CT chest August 20 23rd showed a left upper lobe lung mass.  PET scan on August 18, 2022 showed a hypermetabolic enlarging subpleural nodule in the periphery of the left upper lobe.  Navigational bronchoscopy November 2023 was nondiagnostic several samples were attempted but associated with hemorrhage.  Cytology was nondiagnostic and cultures were negative.  Due to high risk repeat biopsy was not recommended and patient underwent SBRT completed in February 2024.  Previously left upper lobe nodularity 07/23/2020 underwent core biopsy showed inflammation and fibrosis. He is followed by radiation oncology. He has been under surveillance with regular scans.  CT chest September 11, 2024 showed stable left upper lobe consolidation, mildly enlarged lower paratracheal lymph node.  Patient was recently in the emergency room with acute respiratory symptoms treated for COPD exacerbation with antibiotics and steroids.  CT chest was negative for PE, showed significant emphysema.  Progressive masslike consolidation left upper lobe, enlarged mediastinal lymph node measuring 16 mm anterior to the carina.  Subsequent PET done on November 4 showed isolated mediastinal nodal metastasis with hypermetabolic precarinal node measuring 1.4 cm with SUV at 7.8.  Secondary consolidation with low-level activity with SUV at 2.9.  Anterior tongue hypermetabolism most likely  physiologic. Patient complains that he continues to have ongoing cough congestion with thick mucus has also had blood-tinged mucus over the last couple days.  Some bright red and dark mixed at times.  Patient also had general malaise and weakness.  He has a history of atrial fibrillation and is on Xarelto , He has no known issues with anesthesia and has previously undergone procedures without complications from anesthesia. Did have some bleeding during navigational bronchoscopy in 2023.  He reports recent weakness, loss of appetite, and difficulty getting out of bed, which began over the past weekend. No fever or body aches.   COVID and flu test today in the office are negative  He is on Breztri  twice daily for COPD and has albuterol  available. He has not smoked since Saturday night and is using Robitussin DM for cough control. He was previously on amlodipine , which was discontinued due to swelling, and lisinopril , which he stopped recently.     Allergies[1]  Immunization History  Administered Date(s) Administered   Fluad Quad(high Dose 65+) 09/15/2020, 09/16/2021, 09/01/2022   INFLUENZA, HIGH DOSE SEASONAL PF 09/06/2018, 08/18/2019, 09/15/2020   Moderna Sars-Covid-2 Vaccination 11/19/2020   PNEUMOCOCCAL CONJUGATE-20 01/18/2022   Pneumococcal Conjugate-13 08/10/2015   Tdap 03/24/2022   Zoster Recombinant(Shingrix) 10/18/2015   Zoster, Live 10/18/2015    Past Medical History:  Diagnosis Date   A-fib (HCC)    Arthritis    right hand   Bradycardia  COPD (chronic obstructive pulmonary disease) (HCC)    Dyspnea    Dysrhythmia    Frequent urination    GERD (gastroesophageal reflux disease)    Hypercholesterolemia    Hypertension    Pneumonia    Pre-diabetes    Prostate cancer (HCC)    RADIATION TX AND SCHEDULED FOR SEED IMPLANTS 02-28-12   Sleep apnea    uses a cpap   Stroke (HCC)    2011;ST memory loss, no other deficits.   Urinary urgency     Tobacco History: Tobacco  Use History[2] Ready to quit: Not Answered Counseling given: Not Answered Tobacco comments: CHEW TOBACCO FOR 20 YRS, is smoking about 1/2ppd   Outpatient Medications Prior to Visit  Medication Sig Dispense Refill   albuterol  (PROVENTIL ) (2.5 MG/3ML) 0.083% nebulizer solution Take 3 mLs (2.5 mg total) by nebulization every 6 (six) hours as needed for wheezing or shortness of breath. 75 mL 3   albuterol  (VENTOLIN  HFA) 108 (90 Base) MCG/ACT inhaler Inhale 1-2 puffs into the lungs every 4 (four) hours as needed. 18 g 1   amLODipine  (NORVASC ) 10 MG tablet Take 1 tablet (10 mg total) by mouth daily. (Patient not taking: Reported on 10/23/2024) 90 tablet 1   APPLE CIDER VINEGAR PO Take 450 mg by mouth in the morning and at bedtime.     B Complex-C (B-COMPLEX WITH VITAMIN C) tablet Take 1 tablet by mouth daily.     budesonide -glycopyrrolate -formoterol  (BREZTRI  AEROSPHERE) 160-9-4.8 MCG/ACT AERO inhaler Inhale 2 puffs into the lungs in the morning and at bedtime. 10.7 g 11   cephALEXin (KEFLEX) 500 MG capsule Take 500 mg by mouth 2 (two) times daily.     escitalopram  (LEXAPRO ) 5 MG tablet Take 5 mg by mouth in the morning.     lisinopril  (ZESTRIL ) 10 MG tablet Take one tablet (10 mg dose) by mouth daily. (Patient not taking: Reported on 10/23/2024)     lisinopril  (ZESTRIL ) 20 MG tablet Take 20 mg by mouth daily. (Patient taking differently: Take 20 mg by mouth in the morning and at bedtime.)     Magnesium Oxide -Mg Supplement (MAG-OXIDE) 200 MG TABS Take 1 tablet (200 mg total) by mouth in the morning and at bedtime. 180 tablet 3   metoprolol  succinate (TOPROL -XL) 25 MG 24 hr tablet Take 25 mg by mouth daily.     MILK THISTLE PO Take 1 capsule by mouth in the morning and at bedtime.     omeprazole (PRILOSEC) 20 MG capsule Take 20 mg by mouth daily.     rivaroxaban  (XARELTO ) 20 MG TABS tablet Take 20 mg by mouth daily.     simvastatin  (ZOCOR ) 40 MG tablet Take 40 mg by mouth every other day. At bedtime.      solifenacin (VESICARE) 10 MG tablet Take 10 mg by mouth in the morning.     tadalafil  (CIALIS ) 10 MG tablet Take 1 tablet (10 mg total) by mouth daily as needed. 10 tablet 2   tamsulosin  (FLOMAX ) 0.4 MG CAPS capsule Take 0.4 mg by mouth daily.     Vitamin D-Vitamin K (VITAMIN K2-VITAMIN D3 PO) Take 1 capsule by mouth daily.     No facility-administered medications prior to visit.     Review of Systems:   Constitutional:   No  weight loss, night sweats,  Fevers, chills+, fatigue, or  lassitude.  HEENT:   No headaches,  Difficulty swallowing,  Tooth/dental problems, or  Sore throat,  No sneezing, itching, ear ache, nasal congestion, post nasal drip,   CV:  No chest pain,  Orthopnea, PND, swelling in lower extremities, anasarca, dizziness, palpitations, syncope.   GI  No heartburn, indigestion, abdominal pain, nausea, vomiting, diarrhea, change in bowel habits, loss of appetite, bloody stools.   Resp: .  No chest wall deformity  Skin: no rash or lesions.  GU: no dysuria, change in color of urine, no urgency or frequency.  No flank pain, no hematuria   MS:  No joint pain or swelling.  No decreased range of motion.  No back pain.    Physical Exam   Vitals:   11/18/24 1136  BP: 120/72  Pulse: 96  Temp: 99.4 F (37.4 C)  SpO2: 93%     GEN: A/Ox3; pleasant , NAD, well nourished , elderly    HEENT:  [place]/AT,  NOSE-clear, THROAT-clear, no lesions, no postnasal drip or exudate noted.   NECK:  Supple w/ fair ROM; no JVD; normal carotid impulses w/o bruits; no thyromegaly or nodules palpated; no lymphadenopathy.    RESP coarse rhonchi   no accessory muscle use, no dullness to percussion  CARD:  RRR, no m/r/g, no peripheral edema, pulses intact, no cyanosis or clubbing.  GI:   Soft & nt; nml bowel sounds; no organomegaly or masses detected.   Musco: Warm bil, no deformities or joint swelling noted.   Neuro: alert, no focal deficits noted.    Skin: Warm,  no lesions or rashes    Lab Results:Reviewed 11/18/2024   CBC    Component Value Date/Time   WBC 15.6 (H) 10/09/2024 1100   RBC 5.12 10/09/2024 1100   HGB 16.3 10/09/2024 1100   HGB 15.9 04/15/2024 1042   HCT 50.3 10/09/2024 1100   HCT 48.0 04/15/2024 1042   PLT 202 10/09/2024 1100   PLT 198 04/15/2024 1042   MCV 98.2 10/09/2024 1100   MCV 99 (H) 04/15/2024 1042   MCH 31.8 10/09/2024 1100   MCHC 32.4 10/09/2024 1100   RDW 13.7 10/09/2024 1100   RDW 12.3 04/15/2024 1042   LYMPHSABS 1.4 04/15/2024 1042   MONOABS 0.8 03/24/2022 1447   EOSABS 0.2 04/15/2024 1042   BASOSABS 0.0 04/15/2024 1042    BMET    Component Value Date/Time   NA 139 10/09/2024 1100   NA 139 09/04/2024 1508   K 4.4 10/09/2024 1100   CL 99 10/09/2024 1100   CO2 30 10/09/2024 1100   GLUCOSE 105 (H) 10/09/2024 1100   BUN 12 10/09/2024 1100   BUN 9 09/04/2024 1508   CREATININE 1.25 (H) 10/09/2024 1100   CREATININE 1.34 (H) 11/30/2021 1459   CALCIUM 8.9 10/09/2024 1100   GFRNONAA 57 (L) 10/09/2024 1100   GFRAA >60 01/20/2017 1600    BNP    Component Value Date/Time   BNP 127.2 (H) 09/04/2024 1508    ProBNP    Component Value Date/Time   PROBNP 166.0 (H) 09/30/2021 1241    Imaging: NM PET Image Initial (PI) Skull Base To Thigh (F-18 FDG) Result Date: 11/11/2024 EXAM: PET AND CT SKULL BASE TO MID THIGH 11/06/2024 05:44:34 PM TECHNIQUE: RADIOPHARMACEUTICAL: 11.25 mCi F-18 FDG Uptake time 60 minutes. Glucose level 104 mg/dl. Blood pool SUV 1.7. PET imaging was acquired from the base of the skull to the mid thighs. Non-contrast enhanced computed tomography was obtained for attenuation correction and anatomic localization. COMPARISON: CT chest of 10/09/2024. PET of 08/18/2022. CLINICAL HISTORY: Known non-small cell lung cancer with prior SBRT. FINDINGS:  HEAD AND NECK: No hypermetabolic cervical lymph nodes are identified. Photopenia within the right parietal lobe is likely due to remote infarct. A 12  mm partially calcified lesion within the right sylvian fissure was consistent with a thrombosed MCA aneurysm on 06/23/2020 brain MR. Bilateral carotid atherosclerosis. Hypermetabolism about the anterior tongue, SUV 6.7, is without CT correlate. CHEST: A precarinal node is newly enlarged at 1.4 cm and has an SUV of 7.8 on image 59/202. There are no hypermetabolic hilar or axillary lymph nodes. The anterior left upper lobe nodule described in the prior PET has undergone radiation. Secondary consolidation and low-level diffuse activity including an SUV of 2.9 on image 57/202. Centrilobular and paraseptal emphysema. ABDOMEN AND PELVIS: There is no hypermetabolic activity within the liver, adrenal glands, spleen or pancreas. Normal adrenal glands. Mildly scarred right kidney. Scattered colonic diverticula. Radiation seeds in the prostate. Beam hardening artifact degrades evaluation of the pelvis secondary to left hip arthroplasty. There is no hypermetabolic nodal activity in the abdomen or pelvis. Physiologic activity within the gastrointestinal and genitourinary systems. BONES AND SOFT TISSUE: There is no hypermetabolic activity to suggest osseous metastatic disease. IMPRESSION: 1. Isolated mediastinal nodal metastasis. 2. Radiation-induced consolidation in the anterior left upper lobe, without residual or local recurrent disease. 3. Anterior tongue hypermetabolism without CT correlate. Most likely physiologic. Consider physical exam correlation. 4. Aortic atherosclerosis (icd10-i70.0), coronary artery atherosclerosis, and emphysema (icd10-j43.9). Electronically signed by: Rockey Kilts MD 11/11/2024 12:42 PM EST RP Workstation: [id number]    Administration History     None          Latest Ref Rng & Units 07/03/2024   10:53 AM 06/30/2020   10:08 AM 03/19/2015    9:19 AM  PFT Results  FVC-Pre L 4.31  4.45  4.87   FVC-Predicted Pre % 92  91  93   FVC-Post L 4.35  4.57  4.92   FVC-Predicted Post % 92  94  94    Pre FEV1/FVC % % 77  76  74   Post FEV1/FCV % % 78  77  74   FEV1-Pre L 3.31  3.40  3.63   FEV1-Predicted Pre % 98  96  94   FEV1-Post L 3.38  3.50  3.66   DLCO uncorrected ml/min/mmHg 15.05  14.37  20.98   DLCO UNC% % 55  51  53   DLCO corrected ml/min/mmHg 15.50     DLCO COR %Predicted % 56     DLVA Predicted % 66  64  52   TLC L 6.21  7.09  7.56   TLC % Predicted % 78  90  94   RV % Predicted % 58  79  94     No results found for: NITRICOXIDE      No data to display              Assessment & Plan:   Assessment and Plan Assessment & Plan COPD with acute exacerbation and hemoptysis   He presents with an acute exacerbation of COPD and hemoptysis, marked by a cough with blood-tinged sputum, weakness, and anorexia. Recent treatment included Z-Pak and prednisone  1 month ago in ER . SABRA He has no fever or body aches, and flu and COVID tests are negative.  Prescribe Augmentin  875 mg twice daily for one week and prednisone  20 mg daily for five days. Continue Breztri  twice daily and use Robitussin DM as needed for cough control. A sputum culture is ordered. Advise seeking emergency care  if bleeding worsens.   Hypermetabolic mediastinal lymphadenopathy, suspicious for metastasis with history of presumed Lung cancer -hypermetabolic left upper lobe lung nodule status post SBRT completed in February 2024. He has hypermetabolic mediastinal lymphadenopathy on PET scan,  The differential includes malignancy, infection, or inflammation. Previous scans showed fluctuating lymph node size,  A biopsy is needed to confirm the diagnosis. Discussed the risks of biopsy, including bleeding due to Xarelto , and the need to hold Xarelto  two days prior to the procedure. The case was discussed with the interventional team regarding a potential EBUS biopsy. Hold Xarelto  two days prior to biopsy if the procedure.   History of lung cancer treated with radiation   He has a history of lung cancer treated with  radiation in 2024. The current PET scan shows no intense hypermetabolic activity in the treated area, suggesting scar tissue.  Hypermetabolic mediastinal lymphadenopathy -proceed with EBUS due to potential recurrence or metastasis with follow-up scans as scheduled. Case discussed with Dr. Shelah with scans reviewed.         Madelin Stank, NP 11/18/2024  I spent 45   minutes dedicated to the care of this patient on the date of this encounter to include pre-visit review of records, face-to-face time with the patient discussing conditions above, post visit ordering of testing, clinical documentation with the electronic health record, making appropriate referrals as documented, and communicating necessary findings to members of the patients care team.      [1] No Known Allergies [2]  Social History Tobacco Use  Smoking Status Some Days   Current packs/day: 0.00   Average packs/day: 2.0 packs/day for 35.0 years (70.0 ttl pk-yrs)   Types: Cigarettes   Start date: 02/21/1956   Last attempt to quit: 05/04/2024   Years since quitting: 0.5   Passive exposure: Current  Smokeless Tobacco Current   Types: Chew  Tobacco Comments   CHEW TOBACCO FOR 20 YRS, is smoking about 1/2ppd   "

## 2024-11-18 NOTE — Patient Instructions (Addendum)
 Follow up with PCP regarding blood pressure.  Continue on Breztri  2 puffs Twice daily, rinse after use.  Albuterol  inhaler or neb As needed   Work on quitting smoking  Activity as tolerated.  Robitussin DM 1 tsp every 4hrs as needed for cough/congestion  Sputum culture.  Augmentin  875mg  Twice daily for 1 week , take with food  Prednisone  20mg  daily for 5 days  I will be in touch regarding your PET scan/ Follow up with Dr. Darlean  or Nguyen Butler NP in 4 weeks and As needed   Please contact office for sooner follow up if symptoms do not improve or worsen or seek emergency care

## 2024-11-19 ENCOUNTER — Telehealth: Payer: Self-pay | Admitting: Adult Health

## 2024-11-19 DIAGNOSIS — R590 Localized enlarged lymph nodes: Secondary | ICD-10-CM | POA: Insufficient documentation

## 2024-11-19 DIAGNOSIS — C3412 Malignant neoplasm of upper lobe, left bronchus or lung: Secondary | ICD-10-CM

## 2024-11-19 NOTE — Telephone Encounter (Signed)
 Hold Xarelto  for 48hr prior to procedure.

## 2024-11-19 NOTE — Telephone Encounter (Signed)
 Spoke with pt daughter and gave all appt info. Case #[id number]. Sending to [employer] [facility]

## 2024-11-19 NOTE — Telephone Encounter (Signed)
 Please schedule the following:  Provider performing procedure:Hattar  Diagnosis: Hypermetabolic Mediastinal Lymphadenopathy Which side if for nodule / mass? Left  Procedure: EBUS  Has patient been spoken to by Provider and given informed consent? Y Anesthesia: Gen Do you need Fluro?   Duration of procedure: 1hr  Date: 12/02/24  Alternate Date:    Time: AM / PM  Location: [facility] Does patient have OSA? y DM? n Or Latex allergy? n Medication Restriction/ Anticoagulate/Antiplatelet: Y (Xarelto )  Pre-op Labs Ordered:determined by Anesthesia Imaging request: in epic   (If, SuperDimension CT Chest, please have STAT courier sent to ENDO)

## 2024-11-19 NOTE — Telephone Encounter (Signed)
 We need to know how long to hold the blood thinner

## 2024-11-20 ENCOUNTER — Other Ambulatory Visit: Payer: Self-pay

## 2024-11-20 DIAGNOSIS — J441 Chronic obstructive pulmonary disease with (acute) exacerbation: Secondary | ICD-10-CM

## 2024-11-25 ENCOUNTER — Ambulatory Visit: Payer: Self-pay | Admitting: Adult Health

## 2024-11-25 LAB — GRAM STAIN W/SPUTUM CULT RFLX
Epithelial Cells: NONE SEEN
Result 1: NONE SEEN
White Blood Cells: NONE SEEN

## 2024-11-25 LAB — SPUTUM CULTURE

## 2024-11-25 MED ORDER — FLUCONAZOLE 100 MG PO TABS: 100.0000 mg | ORAL_TABLET | Freq: Every day | ORAL | 0 refills | Status: AC

## 2024-11-25 NOTE — Progress Notes (Signed)
 Called and spoke to pt - advised of sputum culture results per Madelin Stank, NP. Pt verbalized understanding, NFN.

## 2024-11-26 ENCOUNTER — Other Ambulatory Visit: Payer: Self-pay

## 2024-11-26 ENCOUNTER — Encounter (HOSPITAL_COMMUNITY): Payer: Self-pay

## 2024-11-26 NOTE — Progress Notes (Signed)
 PCP - Charmaine Heller, NP Cardiologist - Dr Dorn Alvan [place]ough- Dr Lamar Hollingshead  Chest x-ray - 10/09/24 EKG - 10/09/24 Stress Test - 06/21/17 ECHO - 06/30/20 Cardiac Cath - n/a  ICD Pacemaker/Loop - n/a  Sleep Study -  Yes (06/2023)  CPAP - does not use CPAP  Pre-Diabetes - not meds, does not check blood sugar  Xarelto  Instructions:  Hold 48 hours prior to procedure.  Last dose will be on 11/29/24.Follow your surgeon's instructions on when to stop prior to surgery.  Aspirin  Instructions: n/a  NPO  Anesthesia review: Yes  STOP now taking any Aspirin  (unless otherwise instructed by your surgeon), Aleve, Naproxen, Ibuprofen, Motrin, Advil, Goody's, BC's, all herbal medications, fish oil, and all vitamins.   Coronavirus Screening Do you have any of the following symptoms:  Cough yes/no: No Fever (>100.20F)  yes/no: No Runny nose yes/no: No Sore throat yes/no: No Difficulty breathing/shortness of breath  Yes  Have you traveled in the last 14 days and where? yes/no: No  Patient verbalized understanding of instructions that were given via phone.

## 2024-11-28 NOTE — Progress Notes (Signed)
 Anesthesia Chart Review: Same day workup  83 year old male follows with cardiology for history of CAD, HTN, HLD, A-fib on Xarelto , CVA 2011, OSA noncompliant with CPAP.  CCTA 2022 showed three-vessel CAD, no significant stenosis noted by FFR.  Last seen in follow-up by Almarie Crate, NP on 08/05/2024.  Stable at that time, no anginal symptoms, no changes to management, 42-month follow-up recommended.  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  He had a navigational bronchoscopy that was done on October 24, 2022. Cytology was nondiagnostic. Cultures were negative. Unfortunately during navigational bronchoscopy several samples were attempted however had associated hemorrhage around the surrounding parenchyma of the lesion. CT chest 09/11/2024 showed emphysema, chronic peripheral interstitial attenuation, consolidation left upper lobe compatible with prior radiation therapy slightly smaller in volume but more dense than on prior exam stable ascending thoracic aortic aneurysm. CT chest 10/09/2024 showed enlarged mediastinal lymph node 16 mm, masslike consolidation left upper lobe with progression.  He has recently experienced increased coughing with hemoptysis, weakness, anorexia.  He was seen in the ED 10/09/2024 for hemoptysis and cough and treated with Z-Pak and prednisone .  He was seen in follow-up by pulmonology on 11/18/2024 and EBUS was recommended.  History of aortic aneurysm followed by serial CT.  CTA 10/09/2024 showed stable mildly dilated ascending aorta 4.2 cm.  BMP 10/09/2024 reviewed, creatinine mildly elevated 1.25, otherwise unremarkable.  CBC 11/18/2024 reviewed, WBC mildly elevated 17.4, otherwise unremarkable.  Patient reports last dose of Xarelto  11/29/2024.  Patient will need day of surgery labs and evaluation.  EKG 10/09/2024: Atrial  fibrillation.  Rate 84.  Right axis deviation.  Probable anteroseptal infarct, old.  CTA chest 10/09/2024: IMPRESSION: 1. No CT evidence of pulmonary artery embolus. 2. Masslike consolidation in the left upper lobe has progressed since the prior CT. Follow-up recommended. 3. 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 Recommend annual imaging followup by CTA or MRA. This recommendation follows 2010 ACCF/AHA/AATS/ACR/ASA/SCA/SCAI/SIR/STS/SVM Guidelines for the Diagnosis and Management of Patients with Thoracic Aortic Disease. Circulation. 2010; 121: Z733-z630. Aortic aneurysm NOS (ICD10-I71.9) 4.  Aortic Atherosclerosis (ICD10-I70.0).  8  Coronary CTA 02/06/2021: 1. Left Main: 0.97.   2. LAD: Proximal: 0.96, mid: 0.94, distal: 0.85. 3. LCX: 0.96. 4. OM1: 0.95 5. RCA: Not analyzed secondary to significant motion (the patient in atrial fibrillation).   IMPRESSION: 1. CT FFR analysis didn't show any significant stenosis in the LAD and LCX arteries. RCA was not analyzed secondary to significant motion while the patient was in atrial fibrillation with variable heart rate. Aggressive medical management is recommended.  TTE 06/30/2020: 1. Left ventricular ejection fraction, by estimation, is 55 to 60%. The  left ventricle has normal function. The left ventricle has no regional  wall motion abnormalities. There is moderate left ventricular hypertrophy.  Left ventricular diastolic  parameters are indeterminate in the setting of atrial fibrillation.   2. Right ventricular systolic function is normal. The right ventricular  size is normal.   3. Left atrial size was mildly dilated.   4. The mitral valve is grossly normal. Trivial mitral valve  regurgitation.   5. The aortic valve is tricuspid. Aortic valve regurgitation is not  visualized. Mild aortic valve sclerosis is present, with no evidence of  aortic valve stenosis.   6. The inferior vena cava is normal in size with  greater than 50%  respiratory variability, suggesting right atrial pressure of  3 mmHg.     Lynwood Geofm RIGGERS [facility]/Anesthesiology Phone [phone] 11/28/2024 9:43 AM

## 2024-11-28 NOTE — Anesthesia Preprocedure Evaluation (Addendum)
 "                                  Anesthesia Evaluation  Patient identified by MRN, date of birth, ID band Patient awake    Reviewed: Allergy & Precautions, NPO status , Patient's Chart, lab work & pertinent test results  Airway Mallampati: II  TM Distance: >3 FB Neck ROM: Full    Dental no notable dental hx. (+) Edentulous Upper, Edentulous Lower   Pulmonary asthma , sleep apnea , COPD, Current Smoker    + decreased breath sounds      Cardiovascular hypertension, Pt. on medications + dysrhythmias Atrial Fibrillation  Rhythm:Regular Rate:Normal     Neuro/Psych    Depression    CVA    GI/Hepatic Neg liver ROS,GERD  Medicated,,  Endo/Other  negative endocrine ROS    Renal/GU negative Renal ROS  negative genitourinary   Musculoskeletal  (+) Arthritis , Osteoarthritis,    Abdominal Normal abdominal exam  (+)   Peds  Hematology Lab Results      Component                Value               Date                      WBC                      17.4 (H)            11/18/2024                HGB                      15.5                11/18/2024                HCT                      47.1                11/18/2024                MCV                      93.0                11/18/2024                PLT                      175.0               11/18/2024             Lab Results      Component                Value               Date                      NA                       139  10/09/2024                K                        4.4                 10/09/2024                CO2                      30                  10/09/2024                GLUCOSE                  105 (H)             10/09/2024                BUN                      12                  10/09/2024                CREATININE               1.25 (H)            10/09/2024                CALCIUM                  8.9                 10/09/2024                GFR                       56.01 (L)           09/30/2021                EGFR                     46 (L)              09/04/2024                GFRNONAA                 57 (L)              10/09/2024              Anesthesia Other Findings   Reproductive/Obstetrics                              Anesthesia Physical Anesthesia Plan  ASA: 3  Anesthesia Plan: General   Post-op Pain Management:    Induction: Intravenous  PONV Risk Score and Plan: 1 and Ondansetron , Dexamethasone , Treatment may vary due to age or medical condition and Propofol  infusion  Airway Management Planned: Mask and Oral ETT  Additional Equipment: None  Intra-op Plan:   Post-operative Plan: Extubation in OR  Informed Consent: I have reviewed the patients History and Physical, chart, labs and discussed the procedure including the risks, benefits and alternatives  for the proposed anesthesia with the patient or authorized representative who has indicated his/her understanding and acceptance.     Dental advisory given  Plan Discussed with: CRNA  Anesthesia Plan Comments: (PAT note by Lynwood Hope, PA-C: 83 year old male follows with cardiology for history of CAD, HTN, HLD, A-fib on Xarelto , CVA 2011, OSA noncompliant with CPAP.  CCTA 2022 showed three-vessel CAD, no significant stenosis noted by FFR.  Last seen in follow-up by Almarie Crate, NP on 08/05/2024.  Stable at that time, no anginal symptoms, no changes to management, 67-month follow-up recommended.  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  He had a navigational bronchoscopy that was done on October 24, 2022. Cytology was nondiagnostic. Cultures were negative. Unfortunately during navigational bronchoscopy several samples were attempted however had associated hemorrhage around the surrounding parenchyma of the  lesion. CT chest 09/11/2024 showed emphysema, chronic peripheral interstitial attenuation, consolidation left upper lobe compatible with prior radiation therapy slightly smaller in volume but more dense than on prior exam stable ascending thoracic aortic aneurysm. CT chest 10/09/2024 showed enlarged mediastinal lymph node 16 mm, masslike consolidation left upper lobe with progression.  He has recently experienced increased coughing with hemoptysis, weakness, anorexia.  He was seen in the ED 10/09/2024 for hemoptysis and cough and treated with Z-Pak and prednisone .  He was seen in follow-up by pulmonology on 11/18/2024 and EBUS was recommended.  History of aortic aneurysm followed by serial CT.  CTA 10/09/2024 showed stable mildly dilated ascending aorta 4.2 cm.  BMP 10/09/2024 reviewed, creatinine mildly elevated 1.25, otherwise unremarkable.  CBC 11/18/2024 reviewed, WBC mildly elevated 17.4, otherwise unremarkable.  Patient reports last dose of Xarelto  11/29/2024.  Patient will need day of surgery labs and evaluation.  EKG 10/09/2024: Atrial fibrillation.  Rate 84.  Right axis deviation.  Probable anteroseptal infarct, old.  CTA chest 10/09/2024: IMPRESSION: 1. No CT evidence of pulmonary artery embolus. 2. Masslike consolidation in the left upper lobe has progressed since the prior CT. Follow-up recommended. 3. 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 Recommend annual imaging followup by CTA or MRA. This recommendation follows 2010 ACCF/AHA/AATS/ACR/ASA/SCA/SCAI/SIR/STS/SVM Guidelines for the Diagnosis and Management of Patients with Thoracic Aortic Disease. Circulation. 2010; 121: [id number]. Aortic aneurysm NOS (ICD10-I71.9) 4.  Aortic Atherosclerosis (ICD10-I70.0).  8  Coronary CTA 02/06/2021: 1. Left Main: 0.97.  2. LAD: Proximal: 0.96, mid: 0.94, distal: 0.85. 3. LCX: 0.96. 4. OM1: 0.95 5. RCA: Not analyzed secondary to significant motion (the patient in atrial  fibrillation).  IMPRESSION: 1. CT FFR analysis didn't show any significant stenosis in the LAD and LCX arteries. RCA was not analyzed secondary to significant motion while the patient was in atrial fibrillation with variable heart rate. Aggressive medical management is recommended.  TTE 06/30/2020: 1. Left ventricular ejection fraction, by estimation, is 55 to 60%. The  left ventricle has normal function. The left ventricle has no regional  wall motion abnormalities. There is moderate left ventricular hypertrophy.  Left ventricular diastolic  parameters are indeterminate in the setting of atrial fibrillation.  2. Right ventricular systolic function is normal. The right ventricular  size is normal.  3. Left atrial size was mildly dilated.  4. The mitral valve is grossly normal. Trivial mitral valve  regurgitation.  5. The aortic valve is tricuspid. Aortic valve regurgitation is not  visualized. Mild aortic valve sclerosis is present, with no evidence of  aortic valve stenosis.  6. The inferior vena cava is normal in size with greater than 50%  respiratory variability, suggesting right atrial pressure of 3 mmHg.    )         Anesthesia Quick Evaluation  "

## 2024-12-02 ENCOUNTER — Ambulatory Visit (HOSPITAL_COMMUNITY): Payer: Self-pay | Admitting: Physician Assistant

## 2024-12-02 ENCOUNTER — Ambulatory Visit (HOSPITAL_COMMUNITY)

## 2024-12-02 ENCOUNTER — Encounter (HOSPITAL_COMMUNITY): Admission: RE | Disposition: A | Discharge status: Home / Self Care | Payer: Self-pay | Source: Home / Self Care

## 2024-12-02 ENCOUNTER — Ambulatory Visit (HOSPITAL_COMMUNITY): Admission: RE | Admit: 2024-12-02 | Discharge: 2024-12-02 | Disposition: A | Discharge status: Home / Self Care

## 2024-12-02 ENCOUNTER — Encounter (HOSPITAL_COMMUNITY): Payer: Self-pay

## 2024-12-02 DIAGNOSIS — Z8673 Personal history of transient ischemic attack (TIA), and cerebral infarction without residual deficits: Secondary | ICD-10-CM | POA: Diagnosis not present

## 2024-12-02 DIAGNOSIS — G4733 Obstructive sleep apnea (adult) (pediatric): Secondary | ICD-10-CM | POA: Diagnosis not present

## 2024-12-02 DIAGNOSIS — K219 Gastro-esophageal reflux disease without esophagitis: Secondary | ICD-10-CM | POA: Insufficient documentation

## 2024-12-02 DIAGNOSIS — Z85118 Personal history of other malignant neoplasm of bronchus and lung: Secondary | ICD-10-CM | POA: Diagnosis not present

## 2024-12-02 DIAGNOSIS — F17220 Nicotine dependence, chewing tobacco, uncomplicated: Secondary | ICD-10-CM | POA: Diagnosis not present

## 2024-12-02 DIAGNOSIS — J439 Emphysema, unspecified: Secondary | ICD-10-CM | POA: Diagnosis not present

## 2024-12-02 DIAGNOSIS — Z7901 Long term (current) use of anticoagulants: Secondary | ICD-10-CM | POA: Diagnosis not present

## 2024-12-02 DIAGNOSIS — I2510 Atherosclerotic heart disease of native coronary artery without angina pectoris: Secondary | ICD-10-CM | POA: Diagnosis not present

## 2024-12-02 DIAGNOSIS — Z923 Personal history of irradiation: Secondary | ICD-10-CM | POA: Insufficient documentation

## 2024-12-02 DIAGNOSIS — I10 Essential (primary) hypertension: Secondary | ICD-10-CM

## 2024-12-02 DIAGNOSIS — E785 Hyperlipidemia, unspecified: Secondary | ICD-10-CM | POA: Diagnosis not present

## 2024-12-02 DIAGNOSIS — F32A Depression, unspecified: Secondary | ICD-10-CM | POA: Insufficient documentation

## 2024-12-02 DIAGNOSIS — Z77090 Contact with and (suspected) exposure to asbestos: Secondary | ICD-10-CM | POA: Diagnosis not present

## 2024-12-02 DIAGNOSIS — F17210 Nicotine dependence, cigarettes, uncomplicated: Secondary | ICD-10-CM | POA: Diagnosis not present

## 2024-12-02 DIAGNOSIS — I4891 Unspecified atrial fibrillation: Secondary | ICD-10-CM | POA: Insufficient documentation

## 2024-12-02 DIAGNOSIS — R590 Localized enlarged lymph nodes: Secondary | ICD-10-CM

## 2024-12-02 DIAGNOSIS — M1990 Unspecified osteoarthritis, unspecified site: Secondary | ICD-10-CM | POA: Diagnosis not present

## 2024-12-02 HISTORY — PX: VIDEO BRONCHOSCOPY WITH ENDOBRONCHIAL ULTRASOUND: SHX6177

## 2024-12-02 HISTORY — DX: Other amnesia: R41.3

## 2024-12-02 HISTORY — PX: BRONCHIAL NEEDLE ASPIRATION BIOPSY: SHX5106

## 2024-12-02 HISTORY — DX: Male erectile dysfunction, unspecified: N52.9

## 2024-12-02 LAB — COMPREHENSIVE METABOLIC PANEL WITH GFR
ALT: 24 U/L (ref 0–44)
AST: 32 U/L (ref 15–41)
Albumin: 3.6 g/dL (ref 3.5–5.0)
Alkaline Phosphatase: 87 U/L (ref 38–126)
Anion gap: 11 (ref 5–15)
BUN: 25 mg/dL — ABNORMAL HIGH (ref 8–23)
CO2: 24 mmol/L (ref 22–32)
Calcium: 9.4 mg/dL (ref 8.9–10.3)
Chloride: 101 mmol/L (ref 98–111)
Creatinine, Ser: 1.36 mg/dL — ABNORMAL HIGH (ref 0.61–1.24)
GFR, Estimated: 52 mL/min — ABNORMAL LOW
Glucose, Bld: 108 mg/dL — ABNORMAL HIGH (ref 70–99)
Potassium: 4.5 mmol/L (ref 3.5–5.1)
Sodium: 136 mmol/L (ref 135–145)
Total Bilirubin: 0.7 mg/dL (ref 0.0–1.2)
Total Protein: 7.1 g/dL (ref 6.5–8.1)

## 2024-12-02 SURGERY — BRONCHOSCOPY, WITH EBUS
Anesthesia: General | Laterality: Left

## 2024-12-02 MED ORDER — LIDOCAINE 2% (20 MG/ML) 5 ML SYRINGE
INTRAMUSCULAR | Status: DC | PRN
  Administered 2024-12-02: 80 mg via INTRAVENOUS

## 2024-12-02 MED ORDER — PROPOFOL 500 MG/50ML IV EMUL
INTRAVENOUS | Status: DC | PRN
  Administered 2024-12-02: 150 ug/kg/min via INTRAVENOUS

## 2024-12-02 MED ORDER — CHLORHEXIDINE GLUCONATE 0.12 % MT SOLN
OROMUCOSAL | Status: AC
  Administered 2024-12-02: 15 mL via OROMUCOSAL
  Filled 2024-12-02: qty 15

## 2024-12-02 MED ORDER — PROPOFOL 10 MG/ML IV BOLUS
INTRAVENOUS | Status: DC | PRN
  Administered 2024-12-02: 30 mg via INTRAVENOUS
  Administered 2024-12-02: 170 mg via INTRAVENOUS

## 2024-12-02 MED ORDER — DEXAMETHASONE SOD PHOSPHATE PF 10 MG/ML IJ SOLN
INTRAMUSCULAR | Status: DC | PRN
  Administered 2024-12-02: 5 mg via INTRAVENOUS

## 2024-12-02 MED ORDER — LACTATED RINGERS IV SOLN: INTRAVENOUS | Status: DC | PRN

## 2024-12-02 MED ORDER — SUGAMMADEX SODIUM 200 MG/2ML IV SOLN
INTRAVENOUS | Status: DC | PRN
  Administered 2024-12-02: 300 mg via INTRAVENOUS

## 2024-12-02 MED ORDER — LACTATED RINGERS IV SOLN: INTRAVENOUS | Status: DC

## 2024-12-02 MED ORDER — ONDANSETRON HCL 4 MG/2ML IJ SOLN
INTRAMUSCULAR | Status: DC | PRN
  Administered 2024-12-02: 4 mg via INTRAVENOUS

## 2024-12-02 MED ORDER — PHENYLEPHRINE 80 MCG/ML (10ML) SYRINGE FOR IV PUSH (FOR BLOOD PRESSURE SUPPORT)
PREFILLED_SYRINGE | INTRAVENOUS | Status: DC | PRN
  Administered 2024-12-02 (×3): 160 ug via INTRAVENOUS

## 2024-12-02 MED ORDER — PHENYLEPHRINE HCL-NACL 20-0.9 MG/250ML-% IV SOLN
INTRAVENOUS | Status: DC | PRN
  Administered 2024-12-02: 40 ug/min via INTRAVENOUS

## 2024-12-02 MED ORDER — CHLORHEXIDINE GLUCONATE 0.12 % MT SOLN: 15.0000 mL | Freq: Once | OROMUCOSAL | Status: AC

## 2024-12-02 MED ORDER — DEXMEDETOMIDINE HCL IN NACL 80 MCG/20ML IV SOLN
INTRAVENOUS | Status: DC | PRN
  Administered 2024-12-02: 8 ug via INTRAVENOUS

## 2024-12-02 MED ORDER — ROCURONIUM BROMIDE 10 MG/ML (PF) SYRINGE
PREFILLED_SYRINGE | INTRAVENOUS | Status: DC | PRN
  Administered 2024-12-02: 60 mg via INTRAVENOUS

## 2024-12-02 NOTE — Anesthesia Procedure Notes (Signed)
 Procedure Name: Intubation Date/Time: 12/02/2024 10:43 AM  Performed by: Scherrie Mast, CRNAPre-anesthesia Checklist: Patient identified, Emergency Drugs available, Suction available and Patient being monitored Patient Re-evaluated:Patient Re-evaluated prior to induction Oxygen Delivery Method: Circle System Utilized Preoxygenation: Pre-oxygenation with 100% oxygen Induction Type: IV induction Ventilation: Mask ventilation without difficulty Laryngoscope Size: Mac and 4 Grade View: Grade I Tube type: Oral Tube size: 8.5 mm Number of attempts: 1 Airway Equipment and Method: Stylet and Oral airway Placement Confirmation: ETT inserted through vocal cords under direct vision, positive ETCO2 and breath sounds checked- equal and bilateral Secured at: 22 cm Tube secured with: Tape Dental Injury: Teeth and Oropharynx as per pre-operative assessment

## 2024-12-02 NOTE — Transfer of Care (Signed)
 Immediate Anesthesia Transfer of Care Note  Patient: Jonathan Greene  Procedure(s) Performed: BRONCHOSCOPY, WITH EBUS (Left) BRONCHOSCOPY, WITH NEEDLE ASPIRATION BIOPSY  Patient Location: PACU  Anesthesia Type:General  Level of Consciousness: awake, alert , and oriented  Airway & Oxygen Therapy: Patient Spontanous Breathing and Patient connected to nasal cannula oxygen  Post-op Assessment: Report given to RN and Post -op Vital signs reviewed and stable  Post vital signs: Reviewed and stable  Last Vitals:  Vitals Value Taken Time  BP 93/65 12/02/24 11:55  Temp 98.3 1155  Pulse 58 12/02/24 11:58  Resp 26 12/02/24 11:58  SpO2 95 % 12/02/24 11:58  Vitals shown include unfiled device data.  Last Pain:  Vitals:   12/02/24 0918  TempSrc:   PainSc: 5       Patients Stated Pain Goal: 1 (12/02/24 9081)  Complications: No notable events documented.

## 2024-12-02 NOTE — Op Note (Signed)
 Flexible and EBUS Bronchoscopy Procedure Note  Jonathan Greene  [id number]  August 16, 1941  Date:12/02/2024  Time:11:42 AM   Provider Performing:Kendarious Gudino N Salaya Holtrop   Procedure: Flexible bronchoscopy and EBUS Bronchoscopy  Indication(s) Mediastinal lymphadenopathy   Consent Risks of the procedure as well as the alternatives and risks of each were explained to the patient and/or caregiver.  Consent for the procedure was obtained.  Anesthesia General Anesthesia   Time Out Verified patient identification, verified procedure, site/side was marked, verified correct patient position, special equipment/implants available, medications/allergies/relevant history reviewed, required imaging and test results available.   Sterile Technique Usual hand hygiene, masks, gowns, and gloves were used   Procedure Description Diagnostic bronchoscope advanced through endotracheal tube and into airway.  Airways were examined down to subsegmental level with findings noted below.  Following diagnostic evaluation. The diagnostic bronchoscope was then removed and the EBUS bronchoscope was advanced into airway with stations 11L and 4R  biopsied and sent for slide, cell block, and/or culture.  The EBUS bronchoscope was removed after assuring no active bleeding from biopsy site.  Findings: Normal airway anatomy and mucoa, copious clear colored thick secretions noted in the airway. EBUS with abnormal appearing 11L and 4R both biopsied.    Complications/Tolerance None; patient tolerated the procedure well. Chest X-ray is needed post procedure.   EBL Minimal   Specimen(s) 11L and 4R FNA biopsy

## 2024-12-02 NOTE — Interval H&P Note (Signed)
 History and Physical Interval Note:  12/02/2024 10:19 AM  Jonathan Greene  has presented today for surgery, with the diagnosis of mediastinal lymphadenopathy.  The various methods of treatment have been discussed with the patient and family. After consideration of risks, benefits and other options for treatment, the patient has consented to  Procedures: BRONCHOSCOPY, WITH EBUS (Left) as a surgical intervention.  The patient's history has been reviewed, patient examined, no change in status, stable for surgery.  I have reviewed the patient's chart and labs.  Questions were answered to the patient's satisfaction.     Jonathan Greene

## 2024-12-03 NOTE — Anesthesia Postprocedure Evaluation (Signed)
"   Anesthesia Post Note  Patient: Jonathan Greene  Procedure(s) Performed: BRONCHOSCOPY, WITH EBUS (Left) BRONCHOSCOPY, WITH NEEDLE ASPIRATION BIOPSY     Patient location during evaluation: PACU Anesthesia Type: General Level of consciousness: awake and alert Pain management: pain level controlled Vital Signs Assessment: post-procedure vital signs reviewed and stable Respiratory status: spontaneous breathing, nonlabored ventilation, respiratory function stable and patient connected to nasal cannula oxygen Cardiovascular status: blood pressure returned to baseline and stable Postop Assessment: no apparent nausea or vomiting Anesthetic complications: no   There were no known notable events for this encounter.  Last Vitals:  Vitals:   12/02/24 1312 12/02/24 1314  BP: 110/79 113/75  Pulse: (!) 59 60  Resp: 20 (!) 24  Temp: 36.9 C   SpO2: 98% 98%    Last Pain:  Vitals:   12/02/24 1312  TempSrc:   PainSc: 0-No pain                 Cordella P Lashan Macias      "

## 2024-12-04 ENCOUNTER — Encounter (HOSPITAL_COMMUNITY): Payer: Self-pay

## 2024-12-08 LAB — CYTOLOGY - NON PAP

## 2024-12-09 ENCOUNTER — Ambulatory Visit: Admitting: Adult Health

## 2024-12-09 ENCOUNTER — Telehealth: Payer: Self-pay | Admitting: Adult Health

## 2024-12-09 NOTE — Telephone Encounter (Signed)
 Copied from CRM [id number]. Topic: Appointments - Scheduling Inquiry for Clinic >> Dec 08, 2024 12:53 PM Jonathan Greene wrote: Reason for CRM: pt has an appt on 12/10/24 for Greene brochoscopy f/u, he states he prefers to have it scheduled with Madelin Stank NP as that is who he usually sees, he is not sure why he was scheduled with Lauraine Lites NP   **Attempted to call patient to notify that we spoke to his provider and we are able to reschedule for him to f/u with Tammy, NP.   We do have availability for him and ok'd by TP for 12/11/2024. ATC no answer. Will try to call again tomorrow morning prior to Lauraine Groce's appt to offer scheduling options.   NFN

## 2024-12-10 ENCOUNTER — Encounter: Payer: Self-pay | Admitting: Acute Care

## 2024-12-10 ENCOUNTER — Ambulatory Visit: Admitting: Acute Care

## 2024-12-10 VITALS — BP 131/89 | HR 81 | Temp 98.0°F | Ht 74.0 in | Wt 213.0 lb

## 2024-12-10 DIAGNOSIS — J449 Chronic obstructive pulmonary disease, unspecified: Secondary | ICD-10-CM

## 2024-12-10 DIAGNOSIS — R918 Other nonspecific abnormal finding of lung field: Secondary | ICD-10-CM | POA: Diagnosis not present

## 2024-12-10 DIAGNOSIS — R9389 Abnormal findings on diagnostic imaging of other specified body structures: Secondary | ICD-10-CM

## 2024-12-10 DIAGNOSIS — R591 Generalized enlarged lymph nodes: Secondary | ICD-10-CM

## 2024-12-10 DIAGNOSIS — F17210 Nicotine dependence, cigarettes, uncomplicated: Secondary | ICD-10-CM

## 2024-12-10 DIAGNOSIS — Z9889 Other specified postprocedural states: Secondary | ICD-10-CM

## 2024-12-10 DIAGNOSIS — F17200 Nicotine dependence, unspecified, uncomplicated: Secondary | ICD-10-CM

## 2024-12-10 DIAGNOSIS — R911 Solitary pulmonary nodule: Secondary | ICD-10-CM

## 2024-12-10 DIAGNOSIS — R634 Abnormal weight loss: Secondary | ICD-10-CM

## 2024-12-10 DIAGNOSIS — R942 Abnormal results of pulmonary function studies: Secondary | ICD-10-CM

## 2024-12-10 NOTE — Telephone Encounter (Signed)
 Attempted to call patient again regarding his concerns in the attached CRM as a f/u. No answer and left VM.  He is scheduled to see Lauraine Lites today and due to unable reach patient it is fine for him to complete the Bronch f/u.   Please see previous note. NFN at this time.

## 2024-12-10 NOTE — Progress Notes (Addendum)
 "  History of Present Illness Jonathan Greene is a 84 y.o. male current every day with history lung cancer and COPD who presents with concerns about an enlarged lymph node and recent symptoms of weakness and cough.  He was seen by Madelin Stank NP, and bronch was done by Dr. Zaida.   Synopsis Pt. With a history of presumed lung cancer 01/2023 and , COPD referred for an enlarging subpleural nodule peripherally in the left upper lobe with intense hypermetabolic activity consistent with probable malignancy.  He underwent a navigational bronchoscopy October 24, 2022 with cytology was nondiagnostic.  Cultures done during the procedure were negative.  Procedure was complicated by associated hemorrhage around the surrounding parenchymal of the lesion.  Follow-up CT chest December 14, 2022 showed increased spiculated subpleural solid nodule in the left upper lobe measuring 2.4 x 1.4.  Patient underwent SBRT in February 2024 without tissue diagnosis, but with high suspicion of disease based on PET imaging..  Surveillance CT imaging has shown evolving post radiation changes until most recent CT chest as above from emergency room visit on October 09, 2024. This scan showed an enlarged mediastinal lymph node 16 mm, masslike consolidation left upper lobe with progression . Pt. Had also been sick with a slow to resolve suspected COPD Flare during this time, and was treated with multiple rounds of antibiotics and prednisone .He was set up for bronch with EBUS and biopsies to assess for nodal mets. He is here today to review the results.   12/10/2024 Discussed the use of AI scribe software for clinical note transcription with the patient, who gave verbal consent to proceed.  History of Present Illness Jonathan Greene is an 84 year old male who presents for follow-up after bronchoscopy with EBUS and biopsies to further evaluate abnormal CT Chest and PET imaging concerning for nodal metastatic disease .   He states he  has done well after the procedure. No bleeding , fever, discolored secretions, worsening shortness of breath or adverse reaction to anesthesia.  We have reviewed the results of the biopsy.  Both the 11 L and 4 R nodes are negative for malignancy.  This is reassuring, but we will need to do a short term follow up Ct chest to monitor this closely. Plan will be for 8 week repeat scan. He was also noted to have a positive sputum culture for candida glabrata, and candida albicans. He has been treated with Diflucan  100 mg daily x 7 days 12/2302025. He states he is feeling much better.    He has been experiencing respiratory symptoms, including a persistent cough and previously coughing up blood, which has since resolved. He has ongoing production of clear to light greenish mucus. No fever is present. He has been treated with multiple antibiotics and most recently with Diflucan  for a yeast infection identified in a sputum culture. He states he is feeling better overall.   He reports a weight loss of approximately 10 to 15 pounds over the past six months, attributed to a decreased appetite. He experiences pain that radiates from his left side to his back, which worsens with certain movements but is not severe at rest.  A PET scan showed increased metabolism in of mediastinal lymph nodes, and a biopsy of enlarged lymph nodes was negative for cancer. A previous CT scan in November showed an increase in infiltrate.  A sputum culture previously showed scant growth of Candida albicans and glabrata. He has been treated for this as was noted above.  I am wondering if the lymphadenopathy was related to his slow to resolve illness.   He has a history of smoking, described as 'off and on' over the years, and is currently still smoking. We counseled him to quit, and we have provided resources to help him quit smoking.   Per patient he is feeling better. He is compliant with his Breztri . He feels his weight loss has been  related to lack of appetite. He has not had to use his albuterol  lately. He is followed closely by Madelin Stank , NP for his COPD  We will do a follow up CT Chest in 8 weeks and see the patient back to review the results.     Cytology A. LYMPH NODE, 11L, FINE NEEDLE ASPIRATION:  - Negative for carcinoma.  - Lymphoid tissue consistent with sampling of lymph node.   B. LYMPH NODE, 4R, FINE NEEDLE ASPIRATION:  - Negative for carcinoma.  - Lymphoid tissue consistent with sampling of lymph node.   PET scan 11/06/2024 Isolated mediastinal nodal metastasis. 2. Radiation-induced consolidation in the anterior left upper lobe, without residual or local recurrent disease. 3. Anterior tongue hypermetabolism without CT correlate. Most likely physiologic. Consider physical exam correlation. 4. Aortic atherosclerosis (icd10-i70.0), coronary artery atherosclerosis, and emphysema (icd10-j43.9).  Test Results: TEST/EVENTS : Reviewed 11/18/2024  PFT 06/30/20 >> FEV1 3.50 (99%), FEV1% 77, TLC 7.09 (90%), DLCO 51% Lt upper lobe core needle biopsy 07/01/20 >> benign lung with inflammation and fibrosis   Chest Imaging:  CT of the chest without contrast on 06/03/2020 showed 2.3 x 2.2 x 4.4 cm subpleural mass like consolidation in the left upper lobe corresponding to the density seen on the prior radiograph. -Patient smoked for 33 years, quit smoking at age 75.  He smoked on average of 1 pack/day for most years and at the time of quitting, was smoking 3 packs/day of cigarettes. -He worked as a psychologist, occupational and was administrator, sports.  He reports asbestos exposure throughout his career. 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 -PET scan on 06/16/2020 shows posterior left upper lobe pleural-based hypermetabolic nodule, 2.4 x 2.3 cm SUV 6.2.  AP window lymph node measures 6 mm with SUV 4.3.  No hypermetabolic extrathoracic  metastatic disease. -MRI of the brain on 06/22/2020 shows single suspicious focus of enhancement along the inferolateral margin of the temporal lobe on the left measuring 4 mm, not well seen on sagittal or coronal postcontrast imaging.  Unsure if it is small metastasis or a surface vessel.  No second suspicious focus identified.  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 -Left upper lobe needle biopsy on 07/01/2020 shows lung tissue with a lymphoplasmacytic infiltrate and organizing pneumonia type changes and fibrosis.  IHC negative for tumor. -PET scan on 08/16/2020 shows subpleural left upper lobe lung nodule measuring 2.2 x 1.2 cm, previously 2.8 x 2.2 cm.  SUV 3.4, previously 6.2.  Previously described hypermetabolic AP window node has resolved. - CT chest on 11/18/2020 showed further signs of regression of the left upper lobe mass.   Echo 06/30/20 >> EF 55 to 60%, mod LVH   CT chest 07/18/21 >> ascending aorta 4.3 cm, atherosclerosis, mod emphysema, new LUL patchy consolidation, plaque like lesion LUL   CT chest 12/06/21 >> 4.4 cm ascending thoracic aorta, severe emphysema, 2.4 x 0.4 cm mass LUL CT chest August 2023 showed a significantly  increased size of the pleural-based nodule in the lateral aspect of the left upper lobe measuring 2.1 x 1.5 cm.    PET scan on August 18, 2022 showed enlarging subpleural nodule peripherally in the left upper lobe intensely hypermetabolic consistent with probable malignancy.    navigational bronchoscopy that was done on October 24, 2022. Cytology was nondiagnostic. Cultures were negative. Unfortunately during navigational bronchoscopy several samples were attempted however had associated hemorrhage around the surrounding parenchyma of the lesion    CT chest that was completed on December 14, 2022 that showed stable ascending thoracic aorta measuring at 4.4 cm., Severe emphysema, increased spiculated subpleural solid nodule  in the left upper lobe measuring 2.4 x 1.4 cm previously at 1.7 x 1.5 cm.    HST 06/17/23 >> AHI 6.8, SpO2 low 85%      CT chest September 11, 2024, emphysema, chronic peripheral interstitial attenuation, consolidation left upper lobe compatible with prior radiation therapy slightly smaller in volume but more dense than on prior exam stable ascending thoracic aortic aneurysm   CT chest October 09, 2024 enlarged mediastinal lymph node 16 mm, masslike consolidation left upper lobe with progression     Discussed the use of AI scribe software for clinical note transcription with the patient, who gave verbal consent to proceed.       Latest Ref Rng & Units 11/18/2024   12:26 PM 10/09/2024   11:00 AM 05/21/2024    3:53 AM  CBC  WBC 4.0 - 10.5 K/uL 17.4  15.6  21.6   Hemoglobin 13.0 - 17.0 g/dL 84.4  83.6  86.3   Hematocrit 39.0 - 52.0 % 47.1  50.3  42.0   Platelets 150.0 - 400.0 K/uL 175.0  202  190        Latest Ref Rng & Units 12/02/2024    9:32 AM 10/09/2024   11:00 AM 09/04/2024    3:08 PM  BMP  Glucose 70 - 99 mg/dL 891  894  845   BUN 8 - 23 mg/dL 25  12  9    Creatinine 0.61 - 1.24 mg/dL 8.63  8.74  8.48   BUN/Creat Ratio 10 - 24   6   Sodium 135 - 145 mmol/L 136  139  139   Potassium 3.5 - 5.1 mmol/L 4.5  4.4  4.4   Chloride 98 - 111 mmol/L 101  99  98   CO2 22 - 32 mmol/L 24  30  26    Calcium 8.9 - 10.3 mg/dL 9.4  8.9  8.8     BNP    Component Value Date/Time   BNP 127.2 (H) 09/04/2024 1508    ProBNP    Component Value Date/Time   PROBNP 166.0 (H) 09/30/2021 1241    PFT    Component Value Date/Time   FEV1PRE 3.31 07/03/2024 1053   FEV1POST 3.38 07/03/2024 1053   FVCPRE 4.31 07/03/2024 1053   FVCPOST 4.35 07/03/2024 1053   TLC 6.21 07/03/2024 1053   DLCOUNC 15.05 07/03/2024 1053   PREFEV1FVCRT 77 07/03/2024 1053   PSTFEV1FVCRT 78 07/03/2024 1053    DG Chest Port 1 View Result Date: 12/02/2024 CLINICAL DATA:  Status post bronchoscopy EXAM: PORTABLE CHEST 1  VIEW COMPARISON:  October 09, 2024 FINDINGS: Stable cardiomediastinal silhouette. Stable left upper lobe density consistent with history of treated lung cancer in this region. Otherwise stable scarring seen throughout both lungs. No pneumothorax. Bony thorax is unremarkable. IMPRESSION: Stable left upper lobe density consistent with history of treated lung  cancer. No pneumothorax is noted. Electronically Signed   By: Lynwood Landy Raddle M.D.   On: 12/02/2024 13:48     Past medical hx Past Medical History:  Diagnosis Date   A-fib [facility])    Arthritis    right hand   Bradycardia    COPD (chronic obstructive pulmonary disease) (HCC)    Dyspnea    Dysrhythmia    a-fib   ED (erectile dysfunction)    Frequent urination    GERD (gastroesophageal reflux disease)    Hypercholesterolemia    Hypertension    Memory loss    after stroke 2011 - mild per pt   Pneumonia    Pre-diabetes    no meds, does not check blood sugar   Prostate cancer (HCC)    RADIATION TX AND SCHEDULED FOR SEED IMPLANTS 02-28-12   Sleep apnea    does not use CPAP   Stroke (HCC)    2011;ST memory loss, no other deficits.   Urinary urgency      Social History[1]  Mr.Kilmartin reports that he has been smoking cigarettes. He started smoking about 68 years ago. He has a 70 pack-year smoking history. He has been exposed to tobacco smoke. His smokeless tobacco use includes chew. He reports current alcohol use of about 2.0 - 4.0 standard drinks of alcohol per week. He reports that he does not use drugs.  Tobacco Cessation: Ready to quit: Not Answered Counseling given: Not Answered Tobacco comments: CHEW TOBACCO FOR 20 YRS, is smoking about 10-15 cigarettes a day trying to cut back as of 12/11/2023 Current every day smoker.  Counseled x 3 minutes today.  Resources provided.   Past surgical hx, Family hx, Social hx all reviewed.  Current Outpatient Medications on File Prior to Visit  Medication Sig   albuterol  (PROVENTIL ) (2.5  MG/3ML) 0.083% nebulizer solution Take 3 mLs (2.5 mg total) by nebulization every 6 (six) hours as needed for wheezing or shortness of breath.   albuterol  (VENTOLIN  HFA) 108 (90 Base) MCG/ACT inhaler Inhale 1-2 puffs into the lungs every 4 (four) hours as needed.   amLODipine  (NORVASC ) 10 MG tablet Take 1 tablet (10 mg total) by mouth daily.   APPLE CIDER VINEGAR PO Take 450 mg by mouth in the morning and at bedtime.   B Complex-C (B-COMPLEX WITH VITAMIN C) tablet Take 1 tablet by mouth daily.   budesonide -glycopyrrolate -formoterol  (BREZTRI  AEROSPHERE) 160-9-4.8 MCG/ACT AERO inhaler Inhale 2 puffs into the lungs in the morning and at bedtime.   escitalopram  (LEXAPRO ) 5 MG tablet Take 5 mg by mouth in the morning.   Magnesium Oxide -Mg Supplement (MAG-OXIDE) 200 MG TABS Take 1 tablet (200 mg total) by mouth in the morning and at bedtime.   metoprolol  succinate (TOPROL -XL) 25 MG 24 hr tablet Take 25 mg by mouth daily.   MILK THISTLE PO Take 1 capsule by mouth in the morning and at bedtime.   omeprazole (PRILOSEC) 20 MG capsule Take 20 mg by mouth daily.   rivaroxaban  (XARELTO ) 20 MG TABS tablet Take 20 mg by mouth daily.   simvastatin  (ZOCOR ) 40 MG tablet Take 40 mg by mouth every other day. At bedtime.   solifenacin (VESICARE) 10 MG tablet Take 10 mg by mouth in the morning.   tadalafil  (CIALIS ) 10 MG tablet Take 1 tablet (10 mg total) by mouth daily as needed.   tamsulosin  (FLOMAX ) 0.4 MG CAPS capsule Take 0.4 mg by mouth daily.   Vitamin D-Vitamin K (VITAMIN K2-VITAMIN D3 PO) Take  1 capsule by mouth daily.   anastrozole (ARIMIDEX) 1 MG tablet Take 1 mg by mouth daily. (Patient not taking: Reported on 12/10/2024)   testosterone enanthate (DELATESTRYL) 200 MG/ML injection Inject into the muscle 2 (two) times a week. For IM use only (Patient not taking: Reported on 12/10/2024)   No current facility-administered medications on file prior to visit.     Allergies[2]  Review Of  Systems:  Constitutional:   No  weight loss, night sweats,  Fevers, chills, fatigue, or  lassitude.  HEENT:   No headaches,  Difficulty swallowing,  Tooth/dental problems, or  Sore throat,                No sneezing, itching, ear ache, nasal congestion, post nasal drip,   CV:  No chest pain,  Orthopnea, PND, swelling in lower extremities, anasarca, dizziness, palpitations, syncope.   GI  No heartburn, indigestion, abdominal pain, nausea, vomiting, diarrhea, change in bowel habits, loss of appetite, bloody stools.   Resp: + shortness of breath with exertion or at rest.  Baseline  excess mucus, Baseline  productive cough,  No non-productive cough,  No coughing up of blood.  No change in color of mucus.  Intermittent baseline  wheezing.  No chest wall deformity  Skin: no rash or lesions.  GU: no dysuria, change in color of urine, no urgency or frequency.  No flank pain, no hematuria   MS:  No joint pain or swelling.  No decreased range of motion.  No back pain.  Psych:  No change in mood or affect. No depression or anxiety.  No memory loss.   Vital Signs BP 131/89   Pulse 81   Temp 98 F (36.7 C) (Oral)   Ht 6' 2 (1.88 m)   Wt 213 lb (96.6 kg)   SpO2 94%   BMI 27.35 kg/m    Physical Exam: Physical Exam GENERAL: Well developed, well nourished, no acute distress, alert and oriented times 3. EARS NOSE THROAT: No sinus tenderness, tympanic membranes clear, pale nasal mucosa, no oral exudate, no post nasal drip, no lymphadenopathy. CHEST: No wheeze, rales, dullness, no accessory muscle use, no nasal flaring, no sternal retractions. CARDIAC: S1, S2, regular rate and rhythm, no murmur. ABDOMINAL: Soft, non tender. ND, BS present.Body mass index is 27.35 kg/m.  EXTREMITIES: No clubbing, cyanosis, edema. No obvious deformities. NEUROLOGICAL: Normal strength. Alert and oriented x 3, MAE x 4. SKIN: No rashes, warm and dry. No obvious skin lesions. PSYCHIATRIC: Normal mood and  behavior.   Assessment/Plan Assessment & Plan  Pulmonary mass like consolidation with increased metabolism on PET scan.  Differential includes malignancy vs  infection.  Previous sputum culture showed scant growth of Candida species. Recent treatment with Diflucan .  Persistent cough with clear to green mucus. - Ordered CT scan in 8 weeks to re-assess for changes in pulmonary mass like consolidation. - Monitor for additional weight loss - Provided sputum culture containers for morning sputum collection. - Sent sputum for culture , AFB and Fungal eval - Will consider referral to infectious disease if indicated.  Lymphadenopathy, negative for malignancy Lymph nodes biopsied and negative for malignancy.  Likely reactive lymphadenopathy due to suspected chest infiltrates. - Continue monitoring lymph nodes with follow-up imaging as needed. - Dedicated CT Chest in 8 weeks   Nicotine dependence Continues to smoke despite known health risks. Expressed willingness to quit but requires assistance. Counseled x 3 minutes - Provided resources for smoking cessation, including 1-800-QUIT-NOW, free nicotine patches, gum,  and mints. - Provided information on hypnosis and other cessation methods.  Unintentional weight loss Approximately 10-15 pounds over the last six months, likely due to decreased appetite. Plan  Follow up imaging in 8 weeks  COPD Plan Continue Breztri  2 puffs in the morning and 2 puffs in the evening as you have been doing. Rinse mouth after use. Note your daily symptoms > remember red flags for COPD:  Increase in cough, increase in sputum production, increase in shortness of breath or activity intolerance. If you notice these symptoms, please call to be seen.    Please contact office for sooner follow up if symptoms do not improve or worsen or seek emergency care    I spent 25 minutes dedicated to the care of this patient on the date of this encounter to include pre-visit  review of records, face-to-face time with the patient discussing conditions above, post visit ordering of testing, clinical documentation with the electronic health record, making appropriate referrals as documented, and communicating necessary information to the patient's healthcare team.      Lauraine JULIANNA Lites, NP 12/10/2024  10:29 AM             [1]  Social History Tobacco Use   Smoking status: Some Days    Current packs/day: 0.00    Average packs/day: 2.0 packs/day for 35.0 years (70.0 ttl pk-yrs)    Types: Cigarettes    Start date: 02/21/1955    Last attempt to quit: 05/04/2024    Years since quitting: 0.6    Passive exposure: Current   Smokeless tobacco: Current    Types: Chew   Tobacco comments:    CHEW TOBACCO FOR 20 YRS, is smoking about 10-15 cigarettes a day trying to cut back as of 12/11/2023  Vaping Use   Vaping status: Never Used  Substance Use Topics   Alcohol use: Yes    Alcohol/week: 2.0 - 4.0 standard drinks of alcohol    Types: 2 - 4 Shots of liquor per week    Comment: 4 ounces daily bourbon   Drug use: No  [2] No Known Allergies  "

## 2024-12-10 NOTE — Patient Instructions (Addendum)
 It is good to see you today. I am glad you did well after the bronchoscopy with biopsies.  We have discussed the results of your biopsy. The lymph nodes biopsied were both negative for malignancy. This is reassuring. We will still need to monitor you very closely. We will do a CT of the chest in 8 weeks to reassess the infiltrate noted on imaging. As you have been treated with multiple antibiotics I would hope that this is perhaps improved. You will follow-up with me to review the results of the scan. We will do additional sputum cultures. We will provide you with sputum specimen cups today. Please follow the instructions on the paperwork. We spoke with you today about quitting smoking. Below we have provided some resources to help with your smoking cessation efforts. Please call to be seen immediately if you have any blood in your sputum, or weight loss that you cannot explain. We will return you to Madelin Passy, NP for management of your COPD, once we have completed evaluating your chest infiltrate. Continue Breztri  2 puffs in the morning and 2 puffs in the evening as you have been doing. Rinse mouth after use. Note your daily symptoms > remember red flags for COPD:  Increase in cough, increase in sputum production, increase in shortness of breath or activity intolerance. If you notice these symptoms, please call to be seen.    Please contact office for sooner follow up if symptoms do not improve or worsen or seek emergency care      Smoking Cessation  Tips to Quit:  Pick a Quit Day within the next week.  Remove temptations: toss cigarettes, lighters, ashtrays.  Tell someone you trust for support.  Avoid triggers like stress, boredom, or being around smokers.  Use healthy replacements: water , gum, walking, deep breaths.  Stay busy with hobbies, music, drawing, or exercise.  Be patient with yourself--slipping once doesnt mean failure.   [place] Smoking/Nicotine Cessation  Resources  If youre ready to quit TODAY, our virtual care team is available to start your journey to a tobacco free life. Appointments are available from 8 a.m. to 8 p.m. Monday to Friday. Most health insurance plans will cover some level of tobacco cessation visits and medications. To make a virtual appointment and access other resources, follow the link below.  severties.nl   You can receive free nicotine replacement therapy (patches, gum, or mints) by calling 1-800-QUIT NOW. Please call so we can get you on the path to becoming a non-smoker. I know it is hard, but you can do this!  The American Lung Association offers Freedom From Smoking Programs Self guided or group programs offered, check their website for free virtual programs available to [facility] residents Lung Helpline at 1-800-LUNGUSA  http://keith.biz/  [employer] Offers tools and tips to quit smoking.  Free quitSTART app:   Monitor progress, manage cravings, access tools, and more with the app.  portablegrid.se  Hypnosis for smoking cessation  [employer] [phone]  Acupuncture for smoking cessation  [employer] [phone]

## 2024-12-16 ENCOUNTER — Telehealth: Payer: Self-pay | Admitting: *Deleted

## 2024-12-16 NOTE — Telephone Encounter (Signed)
 Copied from CRM [id number]. Topic: Clinical - Request for Lab/Test Order >> Dec 16, 2024 11:07 AM Isabell A wrote: Reason for CRM: Suzen from [employer] states patient is currently dropping off a sputum sample but there is no order on file - attempt to call [facility], no answer.  Suzen feels bad to turn the pt away - his sample came in a [facility] bag but she is going to keep it until the order is received.      Callback number: [phone] Fax # [phone] --------------------------------------------------------------------------------------------------------------------------------------------- Order faxed to [facility].  Nothing further needed.

## 2024-12-16 NOTE — Telephone Encounter (Signed)
 Copied from CRM #[id number]. Topic: Clinical - Request for Lab/Test Order >> Dec 15, 2024  8:23 AM Benton KIDD wrote: Reason for CRM: saw sarah groce and she gave me cups for sputum sample and she said I could go to lab in [place] but got to lab up here the lady say she didnt get a order to process it patient is needing sarah groce to send a lab order to [facility] for patient to drop off sputum . Please give patient a call concerning this  [phone]  Order has been faxed over to [email or web address]

## 2024-12-30 ENCOUNTER — Telehealth: Payer: Self-pay | Admitting: *Deleted

## 2024-12-30 ENCOUNTER — Ambulatory Visit: Admitting: Adult Health

## 2024-12-30 NOTE — Telephone Encounter (Signed)
 CALLED PATIENT TO INFORM OF CT FOR 02-10-25- ARRIVAL TIME- 1:45 PM @ [facility], NO RESTRICTIONS TO SCAN, PATIENT TO RECEIVE RESULTS FROM ALISON PERKINS ON 02-23-25 @ 3:30 PM VIA TELEPHONE, SPOKE WITH PATIENT AND HE IS AWARE OF THESE APPTS. AND THE INSTRUCTIONS

## 2025-01-06 ENCOUNTER — Ambulatory Visit: Admitting: Internal Medicine

## 2025-01-06 ENCOUNTER — Encounter: Payer: Self-pay | Admitting: Internal Medicine

## 2025-01-06 VITALS — BP 120/75 | HR 84 | Ht 74.0 in | Wt 212.0 lb

## 2025-01-06 DIAGNOSIS — C3412 Malignant neoplasm of upper lobe, left bronchus or lung: Secondary | ICD-10-CM

## 2025-01-06 DIAGNOSIS — J4489 Other specified chronic obstructive pulmonary disease: Secondary | ICD-10-CM

## 2025-01-06 DIAGNOSIS — F17210 Nicotine dependence, cigarettes, uncomplicated: Secondary | ICD-10-CM | POA: Diagnosis not present

## 2025-01-06 MED ORDER — PANTOPRAZOLE SODIUM 40 MG PO TBEC: 40.0000 mg | DELAYED_RELEASE_TABLET | Freq: Every day | ORAL | 2 refills | Status: AC

## 2025-01-06 MED ORDER — FAMOTIDINE 20 MG PO TABS: ORAL_TABLET | ORAL | 11 refills | Status: AC

## 2025-01-06 MED ORDER — BREZTRI AEROSPHERE 160-9-4.8 MCG/ACT IN AERO: 2.0000 | INHALATION_SPRAY | Freq: Two times a day (BID) | RESPIRATORY_TRACT | Status: AC

## 2025-01-06 NOTE — Patient Instructions (Addendum)
 For cough/ congestion >  mucinex dm  up to maximum of  1200 mg every 12 hours and use the flutter valve as much as you can   Stop omeprazole (you can take 2 of these instead of one pantoprazole  to use it up)   Pantoprazole  (protonix ) 40 mg   Take  30-60 min before first meal of the day and Pepcid  (famotidine )  20 mg after supper until return to office - this is the best way to tell whether stomach acid is contributing to your problem.    GERD (REFLUX)  is an extremely common cause of respiratory symptoms just like yours , many times with no obvious heartburn at all.    It can be treated with medication, but also with lifestyle changes including elevation of the head of your bed (ideally with 6-8inch blocks under the headboard of your bed),  Smoking cessation, avoidance of late meals, excessive alcohol, and avoid fatty foods, chocolate, peppermint, colas, red wine, and acidic juices such as orange juice.  NO MINT OR MENTHOL  PRODUCTS SO NO COUGH DROPS  USE SUGARLESS CANDY INSTEAD (Jolley ranchers or Stover's or Life Savers) or even ice chips will also do - the key is to swallow to prevent all throat clearing. NO OIL BASED VITAMINS - use powdered substitutes.  Avoid fish oil when coughing.   The key is to stop smoking completely before smoking completely stops you!  Return to see Tammy  NP  in [place] and here as needed

## 2025-01-06 NOTE — Assessment & Plan Note (Addendum)
 Active smoker with repeated Pos PET scanning LUL nodules/ nodes -   He underwent a navigational bronchoscopy October 24, 2022 with cytology was nondiagnostic.  -. Follow-up CT chest December 14, 2022 showed increased spiculated subpleural solid nodule in the left upper lobe measuring 2.4 x 1.4.  -  Patient underwent SBRT in February 2024 without tissue diagnosis, but with high suspicion of disease based on PET imaging..   -  Repeat FOB 12/05/28/25 with EBUS for POS PET again neg for Ca  He has no symptoms related to these findings with plans for seral f/u CT already in place in [place] so will not be addressed further in [facility] - we can see him here prn

## 2025-01-20 ENCOUNTER — Ambulatory Visit: Admitting: Urology

## 2025-02-02 ENCOUNTER — Ambulatory Visit: Admitting: Cardiology

## 2025-02-10 ENCOUNTER — Other Ambulatory Visit (HOSPITAL_COMMUNITY)

## 2025-02-23 ENCOUNTER — Ambulatory Visit: Admitting: Radiation Oncology
